# Patient Record
Sex: Female | Born: 1960
Health system: Southern US, Community
[De-identification: ages and names within clinical notes are randomized; demographics above are authoritative.]

## PROBLEM LIST (undated history)

## (undated) ENCOUNTER — Emergency Department (HOSPITAL_COMMUNITY): Payer: BLUE CROSS/BLUE SHIELD | Source: Home / Self Care

## (undated) DIAGNOSIS — J189 Pneumonia, unspecified organism: Secondary | ICD-10-CM

## (undated) DIAGNOSIS — F329 Major depressive disorder, single episode, unspecified: Secondary | ICD-10-CM

## (undated) DIAGNOSIS — I4891 Unspecified atrial fibrillation: Secondary | ICD-10-CM

## (undated) DIAGNOSIS — N393 Stress incontinence (female) (male): Secondary | ICD-10-CM

## (undated) DIAGNOSIS — E039 Hypothyroidism, unspecified: Secondary | ICD-10-CM

## (undated) DIAGNOSIS — S6992XA Unspecified injury of left wrist, hand and finger(s), initial encounter: Secondary | ICD-10-CM

## (undated) DIAGNOSIS — N8189 Other female genital prolapse: Secondary | ICD-10-CM

## (undated) DIAGNOSIS — Z87898 Personal history of other specified conditions: Secondary | ICD-10-CM

## (undated) DIAGNOSIS — R350 Frequency of micturition: Secondary | ICD-10-CM

## (undated) DIAGNOSIS — K219 Gastro-esophageal reflux disease without esophagitis: Secondary | ICD-10-CM

## (undated) DIAGNOSIS — Z973 Presence of spectacles and contact lenses: Secondary | ICD-10-CM

## (undated) DIAGNOSIS — F419 Anxiety disorder, unspecified: Secondary | ICD-10-CM

## (undated) DIAGNOSIS — D649 Anemia, unspecified: Secondary | ICD-10-CM

## (undated) DIAGNOSIS — F32A Depression, unspecified: Secondary | ICD-10-CM

## (undated) DIAGNOSIS — M1990 Unspecified osteoarthritis, unspecified site: Secondary | ICD-10-CM

## (undated) HISTORY — PX: REDUCTION MAMMAPLASTY: SUR839

## (undated) HISTORY — PX: BREAST REDUCTION SURGERY: SHX8

## (undated) HISTORY — PX: BREAST EXCISIONAL BIOPSY: SUR124

## (undated) HISTORY — DX: Gastro-esophageal reflux disease without esophagitis: K21.9

## (undated) HISTORY — DX: Unspecified atrial fibrillation: I48.91

## (undated) HISTORY — PX: ENDOMETRIAL ABLATION W/ NOVASURE: SUR434

---

## 1985-10-04 HISTORY — PX: APPENDECTOMY: SHX54

## 1989-07-04 HISTORY — PX: TUBAL LIGATION: SHX77

## 1998-08-15 ENCOUNTER — Encounter: Payer: Self-pay | Admitting: Family Medicine

## 1998-08-15 ENCOUNTER — Ambulatory Visit (HOSPITAL_COMMUNITY): Admission: RE | Admit: 1998-08-15 | Discharge: 1998-08-15 | Payer: Self-pay | Admitting: Family Medicine

## 1999-10-28 ENCOUNTER — Other Ambulatory Visit: Admission: RE | Admit: 1999-10-28 | Discharge: 1999-10-28 | Payer: Self-pay | Admitting: Obstetrics and Gynecology

## 1999-12-07 ENCOUNTER — Encounter (INDEPENDENT_AMBULATORY_CARE_PROVIDER_SITE_OTHER): Payer: Self-pay | Admitting: Specialist

## 1999-12-07 ENCOUNTER — Ambulatory Visit (HOSPITAL_COMMUNITY): Admission: RE | Admit: 1999-12-07 | Discharge: 1999-12-07 | Payer: Self-pay | Admitting: *Deleted

## 1999-12-07 HISTORY — PX: OTHER SURGICAL HISTORY: SHX169

## 2000-02-26 ENCOUNTER — Encounter: Admission: RE | Admit: 2000-02-26 | Discharge: 2000-02-26 | Payer: Self-pay

## 2000-08-23 ENCOUNTER — Inpatient Hospital Stay (HOSPITAL_COMMUNITY): Admission: EM | Admit: 2000-08-23 | Discharge: 2000-08-24 | Payer: Self-pay | Admitting: *Deleted

## 2001-02-01 ENCOUNTER — Encounter: Admission: RE | Admit: 2001-02-01 | Discharge: 2001-05-02 | Payer: Self-pay | Admitting: Surgical Oncology

## 2001-03-04 HISTORY — PX: ROUX-EN-Y GASTRIC BYPASS: SHX1104

## 2002-05-09 ENCOUNTER — Other Ambulatory Visit: Admission: RE | Admit: 2002-05-09 | Discharge: 2002-05-09 | Payer: Self-pay | Admitting: Obstetrics and Gynecology

## 2003-10-04 ENCOUNTER — Inpatient Hospital Stay (HOSPITAL_COMMUNITY): Admission: AD | Admit: 2003-10-04 | Discharge: 2003-10-06 | Payer: Self-pay | Admitting: Psychiatry

## 2003-10-07 ENCOUNTER — Other Ambulatory Visit (HOSPITAL_COMMUNITY): Admission: RE | Admit: 2003-10-07 | Discharge: 2003-10-24 | Payer: Self-pay | Admitting: Psychiatry

## 2003-10-31 ENCOUNTER — Other Ambulatory Visit: Admission: RE | Admit: 2003-10-31 | Discharge: 2003-10-31 | Payer: Self-pay | Admitting: Obstetrics and Gynecology

## 2003-10-31 ENCOUNTER — Encounter: Admission: RE | Admit: 2003-10-31 | Discharge: 2003-10-31 | Payer: Self-pay | Admitting: Obstetrics and Gynecology

## 2004-11-04 ENCOUNTER — Emergency Department (HOSPITAL_COMMUNITY): Admission: EM | Admit: 2004-11-04 | Discharge: 2004-11-05 | Payer: Self-pay | Admitting: Emergency Medicine

## 2004-11-06 ENCOUNTER — Ambulatory Visit (HOSPITAL_COMMUNITY): Admission: RE | Admit: 2004-11-06 | Discharge: 2004-11-06 | Payer: Self-pay | Admitting: Obstetrics and Gynecology

## 2004-11-16 ENCOUNTER — Encounter: Admission: RE | Admit: 2004-11-16 | Discharge: 2004-11-16 | Payer: Self-pay | Admitting: Obstetrics and Gynecology

## 2004-11-30 ENCOUNTER — Encounter: Admission: RE | Admit: 2004-11-30 | Discharge: 2004-11-30 | Payer: Self-pay | Admitting: Obstetrics and Gynecology

## 2004-12-10 ENCOUNTER — Encounter: Admission: RE | Admit: 2004-12-10 | Discharge: 2004-12-10 | Payer: Self-pay | Admitting: Obstetrics and Gynecology

## 2004-12-23 ENCOUNTER — Ambulatory Visit (HOSPITAL_COMMUNITY): Admission: RE | Admit: 2004-12-23 | Discharge: 2004-12-23 | Payer: Self-pay | Admitting: Family Medicine

## 2005-01-04 ENCOUNTER — Ambulatory Visit (HOSPITAL_COMMUNITY): Admission: RE | Admit: 2005-01-04 | Discharge: 2005-01-04 | Payer: Self-pay | Admitting: *Deleted

## 2005-02-01 ENCOUNTER — Encounter (HOSPITAL_COMMUNITY): Admission: RE | Admit: 2005-02-01 | Discharge: 2005-02-01 | Payer: Self-pay | Admitting: Family Medicine

## 2005-02-09 ENCOUNTER — Encounter (INDEPENDENT_AMBULATORY_CARE_PROVIDER_SITE_OTHER): Payer: Self-pay | Admitting: *Deleted

## 2005-02-09 ENCOUNTER — Observation Stay (HOSPITAL_COMMUNITY): Admission: RE | Admit: 2005-02-09 | Discharge: 2005-02-10 | Payer: Self-pay | Admitting: *Deleted

## 2005-02-09 HISTORY — PX: LAPAROSCOPIC CHOLECYSTECTOMY: SUR755

## 2005-06-21 ENCOUNTER — Encounter: Admission: RE | Admit: 2005-06-21 | Discharge: 2005-06-21 | Payer: Self-pay | Admitting: Obstetrics and Gynecology

## 2006-01-05 ENCOUNTER — Encounter: Admission: RE | Admit: 2006-01-05 | Discharge: 2006-01-05 | Payer: Self-pay | Admitting: Obstetrics and Gynecology

## 2007-11-17 ENCOUNTER — Ambulatory Visit (HOSPITAL_COMMUNITY): Admission: RE | Admit: 2007-11-17 | Discharge: 2007-11-17 | Payer: Self-pay | Admitting: Family Medicine

## 2009-01-06 ENCOUNTER — Other Ambulatory Visit: Admission: RE | Admit: 2009-01-06 | Discharge: 2009-01-06 | Payer: Self-pay | Admitting: Family Medicine

## 2010-09-14 ENCOUNTER — Encounter
Admission: RE | Admit: 2010-09-14 | Discharge: 2010-09-14 | Payer: Self-pay | Source: Home / Self Care | Attending: Family Medicine | Admitting: Family Medicine

## 2010-09-25 ENCOUNTER — Encounter
Admission: RE | Admit: 2010-09-25 | Discharge: 2010-09-25 | Payer: Self-pay | Source: Home / Self Care | Attending: Family Medicine | Admitting: Family Medicine
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
## 2011-02-19 NOTE — Op Note (Signed)
[facility]  Patient:    Tammy [name], Tammy [name]                       MRN: [id number] Proc. Date: 12/07/99 Adm. Date:  [id number] Disc. Date: [id number] Attending:  Stephenie Acres                           Operative Report  PREOPERATIVE DIAGNOSIS:  Abnormal left mammogram.  POSTOPERATIVE DIAGNOSIS:  Abnormal left mammogram.  PROCEDURE:  Needle localization of left breast and excisional biopsy.  SURGEON:  Catalina Lunger, M.D.  ANESTHESIA:  General, laryngeal mask.  DESCRIPTION OF PROCEDURE:  The patient was taken to the operating room and placed in the supine position.  After adequate general, laryngeal mask anesthesia was induced, the left breast was prepped and draped in a normal sterile fashion. An elliptical incision was made along the wire in the 12 oclock region of the left  breast.  I dissected down to a depth of approximately 11 cm, taking all tissue around the reinforced segment of the wire.  The specimen was sent for specimen mammography.  Adequate hemostasis was ensured and the skin was closed with subcuticular 4-0 Monocryl.  Steri-Strips and a sterile dressing were applied.  The patient tolerated the procedure well and went to PACU in good condition. DD:  12/07/99 TD:  12/08/99 Job: [id number]
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
## 2011-02-19 NOTE — Discharge Summary (Signed)
[facility]  Patient:    Tammy [name], Tammy [name] Visit Number: [id number] MRN: [id number]          Service Type: PSY Location: 50 0501 [id number] Attending Physician:  Otilio Saber Dictated by:   Candi Leash. Orsini, N.P. Admit Date:  08/23/2000 Discharge Date: 08/24/2000                             Discharge Summary  HISTORY OF PRESENT ILLNESS:  This patient is a 50 year old white married female admitted on a voluntary basis for depression.  The patient was having some family problems, arguing with her husband and is having some revenge, The patient funduscopic out her husband was having an affair.  The patient also states she has been drinking, averaging about 2-3 drinks or 2-3 beers twice a week.  She did have a blackout episode 11 days prior to admission after drinking six drinks.  The patient reports she is suspicious of her husband and very stressed.  The patient denies any suicidal ideation or intent and that simply really wanted outpatient treatment and counseling.  Her sleep has been averaging 5-6 hours a night.  Her appetite has been good.  PAST PSYCHIATRIC HISTORY:  The patient has had no recent inpatient treatment. She has had marriage counseling x 2.  PAST MEDICAL HISTORY:  Her primary care provider is Dr. Leonides Sake.  Her medical problems include degenerative disk disease and occasional PVCs.  ADMISSION MEDICATIONS:  Prozac 40 mg 1 p.o. q.h.s., Voltaren and Flexeril.  DRUG ALLERGIES:  SULFA, she gets a rash.  PHYSICAL EXAMINATION:  Blood pressure was slightly elevated at 153/87 and her weight was 268.  MENTAL STATUS EXAMINATION:  An overweight Caucasian female, casually dressed, good eye contact.  She is cooperative.  Speech is normal and relevant and mood is tearful and sad.  Her affect is depressed.  She denies any suicidal ideation or intent.  She denies homicidal ideation, thought or intent. Thought processes are logical and coherent.  No  evidence of psychosis.  No hallucinations, no delusions.  Cognitively, she is alert and oriented.  Her cognitive functioning is intact.  Her judgment is good.  Insight is fair. impulse control is good.  ADMITTING DIAGNOSES: Axis I:    Major depression, recurrent. Axis II:   Deferred. Axis III:  Degenerative disk disease. Axis IV:   Severe, related to marital problems. Axis V:    Current global assessment of functioning is 50, this past year is            58.  HOSPITAL COURSE:  The patient was voluntarily admitted to [facility] for depression.  We maintained safety, checking patient every 15 minutes.  Resume her Prozac 40 mg 1 p.o. q.a.m., trazodone 50 mg ordered q.h.s.  Librium was ordered 25 mg on a p.r.n. basis for signs and symptoms of alcohol withdrawal and Ultram was ordered for headaches.  CONDITION ON DISCHARGE:  The patient had a very short hospital stay.  She was denying any suicidal ideation.  Her mood and affect were fairly euthymic. The patient was doing well with her Prozac.  She was sleeping and eating well There were discussions undertaken about how to cope with her marital issues. The patient was discharged as she was denying any suicidal ideation.  DISCHARGE MEDICATIONS:  Prozac 20 mg 2 tabs q.d., trazodone 50 mg 1 p.o. q.h.s.  ACTIVITY:  No restrictions.  DIET:  No restrictions.  FOLLOW-UP:  The patient is to follow up with Valinda Hoar on September 01, 2000 at 4 p.m. and Lawson Radar on September 07, 2000 at [facility].  DISCHARGE DIAGNOSES: Axis I:    Major depression, recurrent. Axis II:   Deferred. Axis III:  Degenerative disk disease. Axis IV:   Severe related to marital issues. Axis V:    Current global assessment of functioning is 65, this past year is            6. Dictated by:   Candi Leash. Orsini, N.P. Attending Physician:  Otilio Saber DD:  09/21/00 TD:  09/22/00 Job: [id number]
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
## 2011-02-19 NOTE — Op Note (Signed)
Tammy [name], Tammy [name]                ACCOUNT NO.:  [id number]   MEDICAL RECORD NO.:  [id number]          PATIENT TYPE:  AMB   LOCATION:  DAY                          [facility]  [facility]   PHYSICIAN:  Vikki Ports, MDDATE OF BIRTH:  Dec 02, 1960   DATE OF PROCEDURE:  02/09/2005  DATE OF DISCHARGE:                                 OPERATIVE REPORT   PREOPERATIVE DIAGNOSES:  1.  Status post gastric bypass.  2.  Symptomatic cholelithiasis.   POSTOPERATIVE DIAGNOSES:  1.  Status post gastric bypass.  2.  Symptomatic cholelithiasis.   PROCEDURE:  Laparoscopic cholecystectomy with intraoperative cholangiogram.   SURGEON:  Vikki Ports, M.D.   ASSISTANT:  Sandria Bales. Ezzard Standing, M.D.   ANESTHESIA:  General.   DESCRIPTION OF PROCEDURE:  The patient was taken to the operating room and  placed in the supine position.  After adequate general anesthesia was  induced using an endotracheal tube, the abdomen was prepped and draped in  the normal sterile fashion.  Using a vertical oriented infraumbilical  incision, I dissected down the fascia.  Fascia was opened vertically.  Peritoneum was entered and pneumoperitoneum was obtained.  There were  adhesions to the upper midline of the abdomen.  However, I was easily able  to visualize the liver, gallbladder.  I placed a 10-mm trocar in the  subxiphoid region, two 5-mm trocars in the right abdomen.  Gallbladder was  retracted cephalad.  Dissection at the fundus revealed the cystic duct,  which I clipped proximally on the gallbladder.  Ductotomy was made and  cholangiogram was performed which showed free filling of the common duct, no  filling defects, normal anatomy, and free flow into the duodenum.  It was  removed and the cystic duct was triply clipped and divided, as was the  cystic artery.  Gallbladder was taken down off the gallbladder bed using  Bovie electrocautery.  A small hole was made in the posterior wall and it  was placed in an  EndoCatch bag.  Adequate hemostasis was ensured.  The  gallbladder was removed.  The fascial defect was closed with the 0 Vicryl  pursestring sutures.  Skin incisions were closed with subcuticular 4-0  Monocryl.  Steri-Strips and sterile dressings were applied.  The patient  tolerated the procedure well and went to PACU in good condition.      KRH/MEDQ  D:  02/09/2005  T:  02/09/2005  Job:  [id number]

## 2011-03-12 ENCOUNTER — Other Ambulatory Visit: Payer: Self-pay | Admitting: Specialist

## 2011-03-12 DIAGNOSIS — IMO0002 Reserved for concepts with insufficient information to code with codable children: Secondary | ICD-10-CM

## 2011-03-12 DIAGNOSIS — G8929 Other chronic pain: Secondary | ICD-10-CM

## 2011-03-20 ENCOUNTER — Ambulatory Visit
Admission: RE | Admit: 2011-03-20 | Discharge: 2011-03-20 | Disposition: A | Discharge status: Home / Self Care | Payer: Self-pay | Source: Ambulatory Visit | Attending: Specialist | Admitting: Specialist

## 2011-03-20 DIAGNOSIS — G8929 Other chronic pain: Secondary | ICD-10-CM

## 2011-03-20 DIAGNOSIS — IMO0002 Reserved for concepts with insufficient information to code with codable children: Secondary | ICD-10-CM

## 2011-04-13 ENCOUNTER — Other Ambulatory Visit: Payer: Self-pay | Admitting: Family Medicine

## 2011-04-13 DIAGNOSIS — N63 Unspecified lump in unspecified breast: Secondary | ICD-10-CM

## 2011-04-19 ENCOUNTER — Ambulatory Visit
Admission: RE | Admit: 2011-04-19 | Discharge: 2011-04-19 | Disposition: A | Discharge status: Home / Self Care | Payer: 59 | Source: Ambulatory Visit | Attending: Family Medicine | Admitting: Family Medicine

## 2011-04-19 DIAGNOSIS — N63 Unspecified lump in unspecified breast: Secondary | ICD-10-CM

## 2011-10-20 ENCOUNTER — Other Ambulatory Visit: Payer: Self-pay | Admitting: Family Medicine

## 2011-10-20 DIAGNOSIS — N63 Unspecified lump in unspecified breast: Secondary | ICD-10-CM

## 2011-10-27 ENCOUNTER — Ambulatory Visit
Admission: RE | Admit: 2011-10-27 | Discharge: 2011-10-27 | Disposition: A | Discharge status: Home / Self Care | Payer: 59 | Source: Ambulatory Visit | Attending: Family Medicine | Admitting: Family Medicine

## 2011-10-27 DIAGNOSIS — N63 Unspecified lump in unspecified breast: Secondary | ICD-10-CM

## 2012-04-22 ENCOUNTER — Emergency Department (INDEPENDENT_AMBULATORY_CARE_PROVIDER_SITE_OTHER)
Admission: EM | Admit: 2012-04-22 | Discharge: 2012-04-22 | Disposition: A | Discharge status: Home / Self Care | Payer: 59 | Source: Home / Self Care

## 2012-04-22 ENCOUNTER — Encounter (HOSPITAL_COMMUNITY): Payer: Self-pay | Admitting: Nurse Practitioner

## 2012-04-22 ENCOUNTER — Emergency Department (INDEPENDENT_AMBULATORY_CARE_PROVIDER_SITE_OTHER): Payer: 59

## 2012-04-22 DIAGNOSIS — M25511 Pain in right shoulder: Secondary | ICD-10-CM

## 2012-04-22 DIAGNOSIS — M25519 Pain in unspecified shoulder: Secondary | ICD-10-CM

## 2012-04-22 DIAGNOSIS — M79609 Pain in unspecified limb: Secondary | ICD-10-CM

## 2012-04-22 DIAGNOSIS — M79675 Pain in left toe(s): Secondary | ICD-10-CM

## 2012-04-22 MED ORDER — IBUPROFEN 600 MG PO TABS: 600.0000 mg | ORAL_TABLET | Freq: Four times a day (QID) | ORAL | Status: AC | PRN

## 2012-04-22 MED ORDER — IBUPROFEN 800 MG PO TABS
ORAL_TABLET | ORAL | Status: AC
  Filled 2012-04-22: qty 1

## 2012-04-22 MED ORDER — TRAMADOL HCL 50 MG PO TABS: 50.0000 mg | ORAL_TABLET | Freq: Four times a day (QID) | ORAL | Status: AC | PRN

## 2012-04-22 MED ORDER — IBUPROFEN 800 MG PO TABS
800.0000 mg | ORAL_TABLET | Freq: Once | ORAL | Status: AC
  Administered 2012-04-22: 800 mg via ORAL

## 2012-04-22 NOTE — ED Provider Notes (Signed)
Medical screening examination/treatment/procedure(s) were performed by non-physician practitioner and as supervising physician I was immediately available for consultation/collaboration.  Raynald Blend, MD 04/22/12 2050
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
## 2012-08-23 ENCOUNTER — Other Ambulatory Visit: Payer: Self-pay | Admitting: Obstetrics & Gynecology

## 2012-09-13 ENCOUNTER — Encounter (HOSPITAL_COMMUNITY): Payer: Self-pay | Admitting: Pharmacist

## 2012-09-19 ENCOUNTER — Encounter (HOSPITAL_COMMUNITY): Payer: Self-pay | Admitting: *Deleted

## 2012-09-25 ENCOUNTER — Ambulatory Visit (HOSPITAL_COMMUNITY)
Admission: RE | Admit: 2012-09-25 | Discharge: 2012-09-26 | Disposition: A | Discharge status: Home / Self Care | Payer: 59 | Source: Ambulatory Visit | Attending: Obstetrics & Gynecology | Admitting: Obstetrics & Gynecology

## 2012-09-25 ENCOUNTER — Encounter (HOSPITAL_COMMUNITY)
Admission: RE | Disposition: A | Discharge status: Home / Self Care | Payer: Self-pay | Source: Ambulatory Visit | Attending: Obstetrics & Gynecology

## 2012-09-25 ENCOUNTER — Encounter (HOSPITAL_COMMUNITY): Payer: Self-pay | Admitting: Anesthesiology

## 2012-09-25 ENCOUNTER — Ambulatory Visit (HOSPITAL_COMMUNITY): Payer: 59 | Admitting: Anesthesiology

## 2012-09-25 ENCOUNTER — Encounter (HOSPITAL_COMMUNITY): Payer: Self-pay | Admitting: *Deleted

## 2012-09-25 DIAGNOSIS — N816 Rectocele: Secondary | ICD-10-CM

## 2012-09-25 DIAGNOSIS — N393 Stress incontinence (female) (male): Secondary | ICD-10-CM

## 2012-09-25 DIAGNOSIS — N898 Other specified noninflammatory disorders of vagina: Secondary | ICD-10-CM | POA: Insufficient documentation

## 2012-09-25 DIAGNOSIS — N8111 Cystocele, midline: Secondary | ICD-10-CM

## 2012-09-25 DIAGNOSIS — N812 Incomplete uterovaginal prolapse: Secondary | ICD-10-CM | POA: Insufficient documentation

## 2012-09-25 HISTORY — DX: Depression, unspecified: F32.A

## 2012-09-25 HISTORY — DX: Major depressive disorder, single episode, unspecified: F32.9

## 2012-09-25 HISTORY — DX: Unspecified osteoarthritis, unspecified site: M19.90

## 2012-09-25 HISTORY — DX: Anxiety disorder, unspecified: F41.9

## 2012-09-25 HISTORY — PX: ANTERIOR AND POSTERIOR REPAIR: SHX5121

## 2012-09-25 HISTORY — DX: Anemia, unspecified: D64.9

## 2012-09-25 LAB — CBC
HCT: 37.5 % (ref 36.0–46.0)
Hemoglobin: 12 g/dL (ref 12.0–15.0)
MCH: 28.9 pg (ref 26.0–34.0)
MCHC: 32 g/dL (ref 30.0–36.0)
MCV: 90.4 fL (ref 78.0–100.0)
Platelets: 260 10*3/uL (ref 150–400)
RBC: 4.15 MIL/uL (ref 3.87–5.11)
RDW: 13.9 % (ref 11.5–15.5)
WBC: 6.4 10*3/uL (ref 4.0–10.5)

## 2012-09-25 SURGERY — ANTERIOR (CYSTOCELE) AND POSTERIOR REPAIR (RECTOCELE)
Anesthesia: General | Site: Vagina | Wound class: Clean Contaminated

## 2012-09-25 MED ORDER — HYDROMORPHONE HCL PF 1 MG/ML IJ SOLN
INTRAMUSCULAR | Status: AC
  Filled 2012-09-25: qty 1

## 2012-09-25 MED ORDER — CEFAZOLIN SODIUM-DEXTROSE 2-3 GM-% IV SOLR: 2.0000 g | INTRAVENOUS | Status: DC

## 2012-09-25 MED ORDER — LIDOCAINE-EPINEPHRINE 0.5 %-1:200000 IJ SOLN
INTRAMUSCULAR | Status: AC
  Filled 2012-09-25: qty 1

## 2012-09-25 MED ORDER — ESTRADIOL 0.1 MG/GM VA CREA
TOPICAL_CREAM | VAGINAL | Status: DC | PRN
  Administered 2012-09-25: 1 via VAGINAL

## 2012-09-25 MED ORDER — CEFAZOLIN SODIUM-DEXTROSE 2-3 GM-% IV SOLR
INTRAVENOUS | Status: AC
  Filled 2012-09-25: qty 50

## 2012-09-25 MED ORDER — PROPOFOL 10 MG/ML IV EMUL
INTRAVENOUS | Status: AC
  Filled 2012-09-25: qty 20

## 2012-09-25 MED ORDER — ALPRAZOLAM 0.5 MG PO TABS: 0.5000 mg | ORAL_TABLET | Freq: Four times a day (QID) | ORAL | Status: DC | PRN

## 2012-09-25 MED ORDER — DEXAMETHASONE SODIUM PHOSPHATE 10 MG/ML IJ SOLN
INTRAMUSCULAR | Status: AC
  Filled 2012-09-25: qty 1

## 2012-09-25 MED ORDER — ONDANSETRON HCL 4 MG PO TABS: 4.0000 mg | ORAL_TABLET | Freq: Four times a day (QID) | ORAL | Status: DC | PRN

## 2012-09-25 MED ORDER — CITALOPRAM HYDROBROMIDE 20 MG PO TABS
20.0000 mg | ORAL_TABLET | Freq: Every day | ORAL | Status: DC
  Administered 2012-09-26: 20 mg via ORAL
  Filled 2012-09-25 (×2): qty 1

## 2012-09-25 MED ORDER — 0.9 % SODIUM CHLORIDE (POUR BTL) OPTIME
TOPICAL | Status: DC | PRN
  Administered 2012-09-25: 1000 mL

## 2012-09-25 MED ORDER — HYDROMORPHONE HCL PF 1 MG/ML IJ SOLN
0.2500 mg | INTRAMUSCULAR | Status: DC | PRN
  Administered 2012-09-25 (×3): 0.5 mg via INTRAVENOUS

## 2012-09-25 MED ORDER — KCL IN DEXTROSE-NACL 20-5-0.45 MEQ/L-%-% IV SOLN
INTRAVENOUS | Status: DC
  Administered 2012-09-25 – 2012-09-26 (×2): via INTRAVENOUS
  Filled 2012-09-25 (×6): qty 1000

## 2012-09-25 MED ORDER — OXYCODONE-ACETAMINOPHEN 5-325 MG PO TABS
1.0000 | ORAL_TABLET | ORAL | Status: DC | PRN
  Administered 2012-09-25 – 2012-09-26 (×4): 2 via ORAL
  Filled 2012-09-25 (×4): qty 2

## 2012-09-25 MED ORDER — HYDROMORPHONE HCL PF 1 MG/ML IJ SOLN
INTRAMUSCULAR | Status: AC
  Administered 2012-09-25: 0.5 mg via INTRAVENOUS
  Filled 2012-09-25: qty 1

## 2012-09-25 MED ORDER — BUPROPION HCL ER (XL) 150 MG PO TB24
450.0000 mg | ORAL_TABLET | Freq: Every day | ORAL | Status: DC
  Administered 2012-09-26: 450 mg via ORAL
  Filled 2012-09-25 (×2): qty 1

## 2012-09-25 MED ORDER — ONDANSETRON HCL 4 MG/2ML IJ SOLN: 4.0000 mg | Freq: Four times a day (QID) | INTRAMUSCULAR | Status: DC | PRN

## 2012-09-25 MED ORDER — MORPHINE SULFATE 4 MG/ML IJ SOLN
2.0000 mg | INTRAMUSCULAR | Status: DC | PRN
  Administered 2012-09-25 (×2): 2 mg via INTRAVENOUS
  Filled 2012-09-25 (×2): qty 1

## 2012-09-25 MED ORDER — STERILE WATER FOR IRRIGATION IR SOLN: Status: DC | PRN

## 2012-09-25 MED ORDER — OXYCODONE HCL ER 10 MG PO T12A
20.0000 mg | EXTENDED_RELEASE_TABLET | Freq: Two times a day (BID) | ORAL | Status: DC
  Administered 2012-09-25 – 2012-09-26 (×2): 20 mg via ORAL
  Filled 2012-09-25 (×2): qty 2

## 2012-09-25 MED ORDER — MIDAZOLAM HCL 2 MG/2ML IJ SOLN
INTRAMUSCULAR | Status: AC
  Filled 2012-09-25: qty 2

## 2012-09-25 MED ORDER — FENTANYL CITRATE 0.05 MG/ML IJ SOLN
INTRAMUSCULAR | Status: AC
  Filled 2012-09-25: qty 5

## 2012-09-25 MED ORDER — LIDOCAINE HCL (CARDIAC) 20 MG/ML IV SOLN
INTRAVENOUS | Status: AC
  Filled 2012-09-25: qty 5

## 2012-09-25 MED ORDER — OXYCODONE HCL ER 10 MG PO T12A
20.0000 mg | EXTENDED_RELEASE_TABLET | Freq: Two times a day (BID) | ORAL | Status: DC
  Filled 2012-09-25 (×2): qty 2

## 2012-09-25 MED ORDER — LIDOCAINE-EPINEPHRINE 0.5 %-1:200000 IJ SOLN
INTRAMUSCULAR | Status: DC | PRN
  Administered 2012-09-25: 16 mL

## 2012-09-25 MED ORDER — ZOLPIDEM TARTRATE 5 MG PO TABS: 5.0000 mg | ORAL_TABLET | Freq: Every evening | ORAL | Status: DC | PRN

## 2012-09-25 MED ORDER — LACTATED RINGERS IV SOLN
INTRAVENOUS | Status: DC
  Administered 2012-09-25: 125 mL/h via INTRAVENOUS

## 2012-09-25 MED ORDER — ONDANSETRON HCL 4 MG/2ML IJ SOLN
INTRAMUSCULAR | Status: AC
  Filled 2012-09-25: qty 2

## 2012-09-25 MED ORDER — ESTRADIOL 0.1 MG/GM VA CREA
TOPICAL_CREAM | VAGINAL | Status: AC
  Filled 2012-09-25: qty 42.5

## 2012-09-25 MED ORDER — KETOROLAC TROMETHAMINE 30 MG/ML IJ SOLN
30.0000 mg | Freq: Four times a day (QID) | INTRAMUSCULAR | Status: DC
  Administered 2012-09-25 – 2012-09-26 (×3): 30 mg via INTRAVENOUS
  Filled 2012-09-25 (×3): qty 1

## 2012-09-25 SURGICAL SUPPLY — 28 items
CLOTH BEACON ORANGE TIMEOUT ST (SAFETY) ×2 IMPLANT
CONT PATH 16OZ SNAP LID 3702 (MISCELLANEOUS) IMPLANT
DECANTER SPIKE VIAL GLASS SM (MISCELLANEOUS) IMPLANT
DEVICE CAPIO SLIM SINGLE (INSTRUMENTS) IMPLANT
GAUZE PACKING 2X5 YD STERILE (GAUZE/BANDAGES/DRESSINGS) ×4 IMPLANT
GLOVE BIO SURGEON STRL SZ 6.5 (GLOVE) IMPLANT
GLOVE BIOGEL PI IND STRL 6.5 (GLOVE) ×3 IMPLANT
GLOVE BIOGEL PI IND STRL 8 (GLOVE) ×1 IMPLANT
GLOVE BIOGEL PI INDICATOR 6.5 (GLOVE) ×3
GLOVE BIOGEL PI INDICATOR 8 (GLOVE) ×1
GOWN STRL REIN XL XLG (GOWN DISPOSABLE) ×8 IMPLANT
NEEDLE HYPO 22GX1.5 SAFETY (NEEDLE) IMPLANT
NEEDLE MAYO .5 CIRCLE (NEEDLE) ×2 IMPLANT
NEEDLE SPNL 22GX3.5 QUINCKE BK (NEEDLE) IMPLANT
NS IRRIG 1000ML POUR BTL (IV SOLUTION) ×2 IMPLANT
PACK VAGINAL WOMENS (CUSTOM PROCEDURE TRAY) ×2 IMPLANT
SET CYSTO W/LG BORE CLAMP LF (SET/KITS/TRAYS/PACK) ×2 IMPLANT
SUT CAPIO POLYGLYCOLIC (SUTURE) IMPLANT
SUT CHROMIC 2 0 SH (SUTURE) IMPLANT
SUT VIC AB 0 CT1 27 (SUTURE) ×4
SUT VIC AB 0 CT1 27XBRD ANBCTR (SUTURE) ×2 IMPLANT
SUT VIC AB 2-0 SH 27 (SUTURE) ×12
SUT VIC AB 2-0 SH 27XBRD (SUTURE) ×6 IMPLANT
SYR 20CC LL (SYRINGE) IMPLANT
TOWEL OR 17X24 6PK STRL BLUE (TOWEL DISPOSABLE) ×4 IMPLANT
TRAY FOLEY BAG SILVER LF 14FR (CATHETERS) ×2 IMPLANT
TRAY FOLEY CATH 14FR (SET/KITS/TRAYS/PACK) IMPLANT
WATER STERILE IRR 1000ML POUR (IV SOLUTION) ×2 IMPLANT

## 2012-09-25 NOTE — Anesthesia Postprocedure Evaluation (Signed)
  Anesthesia Post-op Note  Patient: Tammy [name]  Procedure(s) Performed: Procedure(s) (LRB) with comments: ANTERIOR (CYSTOCELE) AND POSTERIOR REPAIR (RECTOCELE) (N/A)  Patient Location: PACU and Women[facility] Unit  Anesthesia Type:General  Level of Consciousness: awake, alert , oriented and patient cooperative  Airway and Oxygen Therapy: Patient Spontanous Breathing  Post-op Pain: mild  Post-op Assessment: Patient's Cardiovascular Status Stable, Respiratory Function Stable and No signs of Nausea or vomiting  Post-op Vital Signs: Reviewed and stable  Complications: No apparent anesthesia complications

## 2012-09-25 NOTE — Op Note (Signed)
PREOPERATIVE DIAGNOSES:  1. Symptomatic second-degree cystocele and rectocele.  2. First-degree uterine prolapse.  3. Desire for maintenance of uterus.  POSTOPERATIVE DIAGNOSES:  1. Symptomatic second-degree cystocele and rectocele.  2. First-degree uterine prolapse.  3. Desire for maintenance of uterus.  OPERATIONS PERFORMED:  1. Anterior colporrhaphy.  2. Posterior colporrhaphy and colpoperineoplasty.  SURGEON: JACKSON-MOORE,LISA A  ANESTHESIA: General.  ESTIMATED BLOOD LOSS: 200 mL.   DESCRIPTION OF OPERATION: In the operating room, the patient was placed under general anesthetic in lithotomy position. Examination revealed a widened introitus with a second-degree rectocele.   The uterus is mid position and mobile.  Rectovaginal confirmatory. A time-out was performed confirming the patient, procedure, antibiotic and allergy status.  After appropriate prep, the patient was draped in the usual manner for major vaginal surgery.  A weighted speculum was placed posteriorly and the vagina was retracted anteriorly with a Sims retractor. At the 10 and 2 o'clock positions, at the cervicovaginal mucosal junction, vaginal mucosa was grasped with an Allis clamp and intervening tissue excised. The vagina was then opened in the midline to within a centimeter of distal urethral meatus. The bladder was then sharply dissected free from the overlying vaginal mucosa. Pubovesical-cervical fascia was identified and an initial suture at the urethrovesical angle was placed using 0 Vicryl in a transverse position through that fascial plane. The remainder of the cystocele was reduced with several interrupted 0 Vicryl. Redundant vaginal tissue was excised and the vagina closed in midline with simple and figure-of-eight 0 Vicryl.  The bladder was then retracted superiorly. The hymenal ring was grasped at the 4 and 8 o'clock positions. A triangular incision was made to within a centimeter of the anus. The intervening tissue  was excised. The vagina was then opened in the midline to within a centimeter of the cervix. An anchoring suture was placed superiorly. The perineal body was freed and the rectum freed from the overlying vagina. Three perineal sutures were placed at this point using 0 Vicryl for eventual reapproximation. Perirectal fascia was then transversely sutured in the midline with several interrupted 0 Vicryl for support.  Redundant vaginal mucosa was excised and the vagina closed in the midline in simple fashion with 0 Vicryl, grasping the fascia. Perineal sutures were then tied and the remainder of the perineal body reapproximated with several interrupted 3-0 Vicryl. Rectal exam was negative. The patient tolerated the procedure well. A Foley catheter drained  of clear urine. Rectal examination was negative.  A vaginal pack was placed. The patient was sent to recovery in good condition.

## 2012-09-25 NOTE — Anesthesia Preprocedure Evaluation (Addendum)
Anesthesia Evaluation  Patient identified by MRN, date of birth, ID band Patient awake    Reviewed: Allergy & Precautions, H&P , Patient's Chart, lab work & pertinent test results, reviewed documented beta blocker date and time   Airway Mallampati: II TM Distance: >3 FB Neck ROM: full    Dental No notable dental hx.    Pulmonary  breath sounds clear to auscultation  Pulmonary exam normal       Cardiovascular Rhythm:regular Rate:Normal     Neuro/Psych    GI/Hepatic   Endo/Other  Morbid obesity  Renal/GU      Musculoskeletal   Abdominal   Peds  Hematology   Anesthesia Other Findings   Reproductive/Obstetrics                          Anesthesia Physical Anesthesia Plan  ASA: III  Anesthesia Plan: General   Post-op Pain Management:    Induction: Intravenous  Airway Management Planned: LMA  Additional Equipment:   Intra-op Plan:   Post-operative Plan:   Informed Consent: I have reviewed the patients History and Physical, chart, labs and discussed the procedure including the risks, benefits and alternatives for the proposed anesthesia with the patient or authorized representative who has indicated his/her understanding and acceptance.   Dental Advisory Given  Plan Discussed with: CRNA and Surgeon  Anesthesia Plan Comments: (  Discussed  general anesthesia, including possible nausea, instrumentation of airway, sore throat,pulmonary aspiration, etc. I asked if the were any outstanding questions, or  concerns before we proceeded. )        Anesthesia Quick Evaluation

## 2012-09-25 NOTE — H&P (Signed)
  Subjective:    Patient is a 51 y.o. female scheduled for an anterior/posterior colporrhaphy. Indications for procedure are cystocele/rectocele.  Onset of symptoms was several years agoago. Symptoms have been gradually worsening since that time. She is a aware of a bulge.  There are associated stress incontinence symptoms.  She has done Kegel's exercises as instructed.  Pertinent Gynecological History:  Menses: regular Bleeding: normal Contraception: tubal ligation Preventive screening:  Last mammogram: normal Date: 5/13 Last pap: normal Date: 2012  Discussed Blood/Blood Products: no  Menstrual History: OB History    Grav Para Term Preterm Abortions TAB SAB Ect Mult Living                   No LMP recorded. Patient has had an ablation.    The following portions of the patient's history were reviewed and updated as appropriate: problem list.  Review of Systems Pertinent items are noted in HPI.    Objective:    Ht 5\' 6"  (1.676 m)  Wt 99.791 kg (220 lb)  BMI 35.51 kg/m2  General:   alert  Skin:   normal  HEENT:  PERRLA  Lungs:   clear to auscultation bilaterally  Heart:   regular rate and rhythm, S1, S2 normal, no murmur, click, rub or gallop  Breasts:   normal without suspicious masses, skin or nipple changes or axillary nodes  Abdomen:  soft, non-tender; bowel sounds normal; no masses,  no organomegaly  Pelvis:  POP-Q performed, leading edge--rectocele; negative stress test; gaping introitus; positive anal wink; good external anal sphincter tone                                Assessment:     Pelvic floor relaxation      Plan:    Counseling: Procedure, risks, reasons, benefits and complications (including injury to bowel, bladder,  ureter, bleeding, possibility of transfusion, infection, or fistula formation) reviewed in detail. Consent signed. Preop testing ordered. Instructions reviewed, including NPO after midnight.

## 2012-09-25 NOTE — Transfer of Care (Signed)
Immediate Anesthesia Transfer of Care Note  Patient: Tammy [name]  Procedure(s) Performed: Procedure(s) (LRB) with comments: ANTERIOR (CYSTOCELE) AND POSTERIOR REPAIR (RECTOCELE) (N/A)  Patient Location: PACU  Anesthesia Type:General  Level of Consciousness: sedated  Airway & Oxygen Therapy: Patient Spontanous Breathing and Patient connected to nasal cannula oxygen  Post-op Assessment: Report given to PACU RN and Post -op Vital signs reviewed and stable  Post vital signs: Reviewed and stable  Complications: No apparent anesthesia complications

## 2012-09-25 NOTE — Addendum Note (Signed)
Addendum  created 09/25/12 1713 by Collier Flowers, CRNA   Modules edited:Notes Section

## 2012-09-25 NOTE — Anesthesia Postprocedure Evaluation (Signed)
  Anesthesia Post-op Note  Patient: Tammy [name]  Procedure(s) Performed: Procedure(s) (LRB) with comments: ANTERIOR (CYSTOCELE) AND POSTERIOR REPAIR (RECTOCELE) (N/A)  Patient is awake and responsive. Pain and nausea are reasonably well controlled. Vital signs are stable and clinically acceptable. Oxygen saturation is clinically acceptable. There are no apparent anesthetic complications at this time. Patient is ready for discharge.

## 2012-09-26 DIAGNOSIS — N393 Stress incontinence (female) (male): Secondary | ICD-10-CM

## 2012-09-26 LAB — CBC
HCT: 32.4 % — ABNORMAL LOW (ref 36.0–46.0)
Hemoglobin: 10.6 g/dL — ABNORMAL LOW (ref 12.0–15.0)
MCH: 29.6 pg (ref 26.0–34.0)
MCHC: 32.7 g/dL (ref 30.0–36.0)
MCV: 90.5 fL (ref 78.0–100.0)
Platelets: 213 10*3/uL (ref 150–400)
RBC: 3.58 MIL/uL — ABNORMAL LOW (ref 3.87–5.11)
RDW: 14 % (ref 11.5–15.5)
WBC: 9.3 10*3/uL (ref 4.0–10.5)

## 2012-09-26 LAB — BASIC METABOLIC PANEL
BUN: 7 mg/dL (ref 6–23)
CO2: 27 mEq/L (ref 19–32)
Calcium: 8.4 mg/dL (ref 8.4–10.5)
Chloride: 105 mEq/L (ref 96–112)
Creatinine, Ser: 0.68 mg/dL (ref 0.50–1.10)
GFR calc Af Amer: >90 mL/min (ref >90)
GFR calc non Af Amer: >90 mL/min (ref >90)
Glucose, Bld: 105 mg/dL — ABNORMAL HIGH (ref 70–99)
Potassium: 3.8 mEq/L (ref 3.5–5.1)
Sodium: 139 mEq/L (ref 135–145)

## 2012-09-26 MED ORDER — OXYCODONE-ACETAMINOPHEN 5-325 MG PO TABS: 1.0000 | ORAL_TABLET | ORAL | Status: DC | PRN

## 2012-09-26 MED FILL — Ondansetron HCl Inj 4 MG/2ML (2 MG/ML): INTRAMUSCULAR | Qty: 1 | Status: AC

## 2012-09-26 MED FILL — Fentanyl Citrate Inj 0.05 MG/ML: INTRAMUSCULAR | Qty: 5 | Status: AC

## 2012-09-26 MED FILL — Lidocaine HCl IV Inj 20 MG/ML: INTRAVENOUS | Qty: 1 | Status: AC

## 2012-09-26 MED FILL — Hydromorphone HCl Inj 1 MG/ML: INTRAMUSCULAR | Qty: 1 | Status: AC

## 2012-09-26 MED FILL — Midazolam HCl Inj 2 MG/2ML (Base Equivalent): INTRAMUSCULAR | Qty: 2 | Status: AC

## 2012-09-26 MED FILL — Lactated Ringer's Solution: INTRAVENOUS | Qty: 1000 | Status: AC

## 2012-09-26 MED FILL — Dexamethasone Sodium Phosphate Inj 10 MG/ML: INTRAMUSCULAR | Qty: 1 | Status: AC

## 2012-09-26 MED FILL — Lactated Ringer's Solution: INTRAVENOUS | Qty: 2000 | Status: AC

## 2012-09-26 MED FILL — Cefazolin Sodium for IV Soln 2 GM and Dextrose 3% (50 ML): INTRAVENOUS | Qty: 50 | Status: AC

## 2012-09-26 MED FILL — Propofol IV Emul 10 MG/ML: INTRAVENOUS | Qty: 20 | Status: AC

## 2012-09-26 NOTE — Progress Notes (Signed)
PTS   Voids and  Residual  Reviewed   With  DR Clearance Coots   OK to  Discharge

## 2012-09-26 NOTE — Discharge Summary (Signed)
Physician Discharge Summary  Patient ID: Tammy [name] MRN: [id number] DOB/AGE: [birth year] 51 y.o.  Admit date: 09/25/2012 Discharge date: 09/26/2012  Admission Diagnoses:  Rectocele with cystocele without uterine prolapse.  SUI.  Discharge Diagnoses:   Same Active Problems:  SUI (stress urinary incontinence, female)   Discharged Condition: good  Hospital Course: Underwent A&P Repair without complications.  Discharged home.  Consults: None  Significant Diagnostic Studies: labs: CBC  Treatments: IV hydration, analgesia: Oxycontin and surgery: A&P Repair  Discharge Exam: Blood pressure 109/72, pulse 85, temperature 98 F (36.7 C), temperature source Oral, resp. rate 18, height 5\' 6"  (1.676 m), weight 220 lb (99.791 kg), SpO2 97.00%. General appearance: alert and no distress Resp: clear to auscultation bilaterally Cardio: regular rate and rhythm, S1, S2 normal, no murmur, click, rub or gallop GI: normal findings: soft, non-tender Extremities: extremities normal, atraumatic, no cyanosis or edema Vagina with scant bleeding.  Disposition: 01-Home or Self Care  Discharge Orders    Future Orders Please Complete By Expires   Diet - low sodium heart healthy      Increase activity slowly      Discharge instructions      Comments:   Routine   Driving Restrictions      Comments:   No driving for 2 weeks.   Lifting restrictions      Comments:   No lifting > 20 lbs.   Sexual Activity Restrictions      Comments:   No sex for 6 weeks.   Call MD for:      Call MD for:  temperature >100.4      Call MD for:  persistant nausea and vomiting      Call MD for:  severe uncontrolled pain      Call MD for:  redness, tenderness, or signs of infection (pain, swelling, redness, odor or green/yellow discharge around incision site)      Call MD for:  difficulty breathing, headache or visual disturbances      Call MD for:  hives      Call MD for:  persistant dizziness or light-headedness       Call MD for:  extreme fatigue      (HEART FAILURE PATIENTS) Call MD:  Anytime you have any of the following symptoms: 1) 3 pound weight gain in 24 hours or 5 pounds in 1 week 2) shortness of breath, with or without a dry hacking cough 3) swelling in the hands, feet or stomach 4) if you have to sleep on extra pillows at night in order to breathe.      Discharge patient          Medication List     As of 09/26/2012 10:02 AM    TAKE these medications         ALPRAZolam 0.5 MG tablet   Commonly known as: XANAX   Take 0.5 mg by mouth 4 (four) times daily as needed.      buPROPion 150 MG 24 hr tablet   Commonly known as: WELLBUTRIN XL   Take 450 mg by mouth daily.      citalopram 20 MG tablet   Commonly known as: CELEXA   Take 20 mg by mouth daily.      diclofenac sodium 1 % Gel   Commonly known as: VOLTAREN   Apply 2 g topically daily.      multivitamin with minerals Tabs   Take 1 tablet by mouth daily.      oxyCODONE  20 MG 12 hr tablet   Commonly known as: OXYCONTIN   Take 20 mg by mouth every 12 (twelve) hours.      oxyCODONE-acetaminophen 5-325 MG per tablet   Commonly known as: PERCOCET/ROXICET   Take 1-2 tablets by mouth every 4 (four) hours as needed for pain (moderate to severe pain (when tolerating fluids)).      VITAMIN B-12 PO   Take 1 tablet by mouth.           Follow-up Information    Follow up with Antionette Char A, MD. Schedule an appointment as soon as possible for a visit in 2 weeks.   Contact information:   [address] [place] [address] [phone]          Signed: Brock Bad 09/26/2012, 10:02 AM

## 2012-09-26 NOTE — Progress Notes (Signed)
Vaginal packing removed. Moderate sanguinous drainage with few marble sized clots present. Patient tolerated well.

## 2012-09-26 NOTE — Progress Notes (Signed)
1 Day Post-Op Procedure(s) (LRB): ANTERIOR (CYSTOCELE) AND POSTERIOR REPAIR (RECTOCELE) (N/A)  Subjective: Patient reports no complaints.  Objective: I have reviewed patient's vital signs, intake and output, medications and labs.  General: alert and no distress Resp: clear to auscultation bilaterally Cardio: regular rate and rhythm, S1, S2 normal, no murmur, click, rub or gallop GI: normal findings: soft, non-tender Extremities: extremities normal, atraumatic, no cyanosis or edema Vaginal Bleeding: minimal  Assessment: s/p Procedure(s) (LRB) with comments: ANTERIOR (CYSTOCELE) AND POSTERIOR REPAIR (RECTOCELE) (N/A): stable and tolerating diet  Plan: Advance diet Discontinue IV fluids Discharge home  LOS: 1 day    HARPER,CHARLES A 09/26/2012, 9:47 AM

## 2012-09-27 ENCOUNTER — Encounter (HOSPITAL_COMMUNITY): Payer: Self-pay | Admitting: Obstetrics & Gynecology

## 2012-11-01 ENCOUNTER — Emergency Department (HOSPITAL_COMMUNITY)
Admission: EM | Admit: 2012-11-01 | Discharge: 2012-11-01 | Disposition: A | Discharge status: Home / Self Care | Payer: 59 | Attending: Emergency Medicine | Admitting: Emergency Medicine

## 2012-11-01 ENCOUNTER — Encounter (HOSPITAL_COMMUNITY): Payer: Self-pay | Admitting: Emergency Medicine

## 2012-11-01 DIAGNOSIS — M79606 Pain in leg, unspecified: Secondary | ICD-10-CM

## 2012-11-01 DIAGNOSIS — Z9889 Other specified postprocedural states: Secondary | ICD-10-CM | POA: Insufficient documentation

## 2012-11-01 DIAGNOSIS — M129 Arthropathy, unspecified: Secondary | ICD-10-CM | POA: Insufficient documentation

## 2012-11-01 DIAGNOSIS — R209 Unspecified disturbances of skin sensation: Secondary | ICD-10-CM | POA: Insufficient documentation

## 2012-11-01 DIAGNOSIS — Z862 Personal history of diseases of the blood and blood-forming organs and certain disorders involving the immune mechanism: Secondary | ICD-10-CM | POA: Insufficient documentation

## 2012-11-01 DIAGNOSIS — F411 Generalized anxiety disorder: Secondary | ICD-10-CM | POA: Insufficient documentation

## 2012-11-01 DIAGNOSIS — Z9089 Acquired absence of other organs: Secondary | ICD-10-CM | POA: Insufficient documentation

## 2012-11-01 DIAGNOSIS — F3289 Other specified depressive episodes: Secondary | ICD-10-CM | POA: Insufficient documentation

## 2012-11-01 DIAGNOSIS — F329 Major depressive disorder, single episode, unspecified: Secondary | ICD-10-CM | POA: Insufficient documentation

## 2012-11-01 DIAGNOSIS — IMO0001 Reserved for inherently not codable concepts without codable children: Secondary | ICD-10-CM | POA: Insufficient documentation

## 2012-11-01 DIAGNOSIS — M79609 Pain in unspecified limb: Secondary | ICD-10-CM

## 2012-11-01 DIAGNOSIS — Z8659 Personal history of other mental and behavioral disorders: Secondary | ICD-10-CM | POA: Insufficient documentation

## 2012-11-01 DIAGNOSIS — Z79899 Other long term (current) drug therapy: Secondary | ICD-10-CM | POA: Insufficient documentation

## 2012-11-01 LAB — CBC
HCT: 39.4 % (ref 36.0–46.0)
Hemoglobin: 13 g/dL (ref 12.0–15.0)
MCH: 29.5 pg (ref 26.0–34.0)
MCHC: 33 g/dL (ref 30.0–36.0)
MCV: 89.3 fL (ref 78.0–100.0)
Platelets: 268 10*3/uL (ref 150–400)
RBC: 4.41 MIL/uL (ref 3.87–5.11)
RDW: 13.3 % (ref 11.5–15.5)
WBC: 6.4 10*3/uL (ref 4.0–10.5)

## 2012-11-01 LAB — BASIC METABOLIC PANEL
BUN: 11 mg/dL (ref 6–23)
CO2: 25 mEq/L (ref 19–32)
Calcium: 9 mg/dL (ref 8.4–10.5)
Chloride: 104 mEq/L (ref 96–112)
Creatinine, Ser: 0.76 mg/dL (ref 0.50–1.10)
GFR calc Af Amer: >90 mL/min (ref >90)
GFR calc non Af Amer: >90 mL/min (ref >90)
Glucose, Bld: 100 mg/dL — ABNORMAL HIGH (ref 70–99)
Potassium: 4.1 mEq/L (ref 3.5–5.1)
Sodium: 138 mEq/L (ref 135–145)

## 2012-11-01 MED ORDER — HYDROCODONE-ACETAMINOPHEN 5-325 MG PO TABS: 1.0000 | ORAL_TABLET | Freq: Four times a day (QID) | ORAL | Status: DC | PRN

## 2012-11-01 MED ORDER — CYCLOBENZAPRINE HCL 10 MG PO TABS: 10.0000 mg | ORAL_TABLET | Freq: Two times a day (BID) | ORAL | Status: DC | PRN

## 2012-11-01 MED ORDER — CYCLOBENZAPRINE HCL 10 MG PO TABS
10.0000 mg | ORAL_TABLET | Freq: Once | ORAL | Status: AC
  Administered 2012-11-01: 10 mg via ORAL
  Filled 2012-11-01: qty 1

## 2012-11-01 NOTE — ED Notes (Signed)
Pt st's she had surg on 09/25/12 for rectocele, and bladder lift.  St's she has had pain in right leg since then.  Unable to sleep last pm due to pain. St's pain starts in right hip and radiates down to mid calf.

## 2012-11-01 NOTE — ED Notes (Signed)
Pt discharged.Vital signs stable and GCS 15 

## 2012-11-01 NOTE — ED Provider Notes (Signed)
History   This chart was scribed for Clinton Sawyer, non-physician practitioner, working with Glynn Octave, MD by Charolett Bumpers, ED Scribe. This patient was seen in room TR07C/TR07C and the patient's care was started at 1756.    CSN: [id number]  Arrival date & time 11/01/12  1610   First MD Initiated Contact with Patient 11/01/12 1756      Chief Complaint  Patient presents with  . Leg Pain    The history is provided by the patient. No language interpreter was used.  Tammy [name] is a 52 y.o. female who presents to the Emergency Department complaining of severe right leg pain. She states that she had surgery on 09/25/12 and has had right leg pain intermittently since. She reports the pain has been constant for the past week and has gradually worsened. She reports associated numbness/tingling and her symptoms are aggravated with bearing weight after long periods of sitting down. She denies any urinary symptoms, chest pain, SOB. She has taken Tylenol without relief. She denies any h/o DVT. Her doctor advised her to come to the emergency department to rule out blood clot.  Past Medical History  Diagnosis Date  . Mental disorder   . Depression   . Anxiety   . Anemia     history  . Arthritis     neck and lower back - deg disc disease  . SVD (spontaneous vaginal delivery)     x 3    Past Surgical History  Procedure Date  . Appendectomy   . Cholecystectomy   . Gastric bypass   . Tubal ligation   . Anterior and posterior repair 09/25/2012    Procedure: ANTERIOR (CYSTOCELE) AND POSTERIOR REPAIR (RECTOCELE);  Surgeon: Antionette Char, MD;  Location: [facility];  Service: Gynecology;  Laterality: N/A;    No family history on file.  History  Substance Use Topics  . Smoking status: Never Smoker   . Smokeless tobacco: Never Used  . Alcohol Use: No     Comment: drinks daily - none since 08/2012 per pt.    OB History    Grav Para Term Preterm Abortions TAB SAB Ect  Mult Living                  Review of Systems  Musculoskeletal: Positive for myalgias. Negative for joint swelling.  All other systems reviewed and are negative.    Allergies  Adhesive; Latex; and Sulfa drugs cross reactors  Home Medications   Current Outpatient Rx  Name  Route  Sig  Dispense  Refill  . ALPRAZOLAM 0.5 MG PO TABS   Oral   Take 0.5 mg by mouth 4 (four) times daily as needed.          . BUPROPION HCL ER (XL) 150 MG PO TB24   Oral   Take 450 mg by mouth daily.         Marland Kitchen CITALOPRAM HYDROBROMIDE 20 MG PO TABS   Oral   Take 20 mg by mouth daily.         Marland Kitchen VITAMIN B-12 PO   Oral   Take 1 tablet by mouth.         . DICLOFENAC SODIUM 1 % TD GEL   Topical   Apply 2 g topically daily.         . ADULT MULTIVITAMIN W/MINERALS CH   Oral   Take 1 tablet by mouth daily.         . OXYCODONE  HCL ER 20 MG PO TB12   Oral   Take 20 mg by mouth every 12 (twelve) hours.         . OXYCODONE-ACETAMINOPHEN 5-325 MG PO TABS   Oral   Take 1-2 tablets by mouth every 4 (four) hours as needed for pain (moderate to severe pain (when tolerating fluids)).   40 tablet   0     BP 136/77  Pulse 81  Temp 98.5 F (36.9 C) (Oral)  SpO2 97%  LMP 09/30/2012  Physical Exam  Nursing note and vitals reviewed. Constitutional: She is oriented to person, place, and time. She appears well-developed and well-nourished. No distress.  HENT:  Head: Normocephalic and atraumatic.  Eyes: Conjunctivae normal and EOM are normal.  Neck: Neck supple. No tracheal deviation present.  Cardiovascular: Normal rate, regular rhythm, normal heart sounds and intact distal pulses.   Pulmonary/Chest: Effort normal and breath sounds normal. No respiratory distress.  Musculoskeletal: Normal range of motion. She exhibits tenderness.       Tenderness down the entire right lateral thigh. Tenderness below the knee in the left calf.   Neurological: She is alert and oriented to person, place,  and time.  Skin: Skin is warm and dry.  Psychiatric: She has a normal mood and affect. Her behavior is normal.    ED Course  Procedures (including critical care time)  DIAGNOSTIC STUDIES: Oxygen Saturation is 97% on room air, adequate by my interpretation.    COORDINATION OF CARE:  18:10-Discussed planned course of treatment with the patient including a right lower extremity venous duplex,  who is agreeable at this time.   19:00-Recheck: Venous duplex study was negative. Will order CBC and BMP. Will also order Flexeril.  19:30-Medication Orders: Cyclobenzaprine (Flexeril) tablet 10 mg-once.   Results for orders placed during the hospital encounter of 11/01/12  CBC      Component Value Range   WBC 6.4  4.0 - 10.5 K/uL   RBC 4.41  3.87 - 5.11 MIL/uL   Hemoglobin 13.0  12.0 - 15.0 g/dL   HCT 16.1  09.6 - 04.5 %   MCV 89.3  78.0 - 100.0 fL   MCH 29.5  26.0 - 34.0 pg   MCHC 33.0  30.0 - 36.0 g/dL   RDW 40.9  81.1 - 91.4 %   Platelets 268  150 - 400 K/uL  BASIC METABOLIC PANEL      Component Value Range   Sodium 138  135 - 145 mEq/L   Potassium 4.1  3.5 - 5.1 mEq/L   Chloride 104  96 - 112 mEq/L   CO2 25  19 - 32 mEq/L   Glucose, Bld 100 (*) 70 - 99 mg/dL   BUN 11  6 - 23 mg/dL   Creatinine, Ser 7.82  0.50 - 1.10 mg/dL   Calcium 9.0  8.4 - 95.6 mg/dL   GFR calc non Af Amer >90  >90 mL/min   GFR calc Af Amer >90  >90 mL/min    No results found.   1. Leg pain       MDM  52 year old female with right leg pain after having surgery on December 23. She was advised to be evaluated for potential blood clot. M the duplex ultrasound negative for any blood clots. Basic labs normal. She was given muscle relaxers and pain medication and she will followup with her primary care physician. Case discussed with Dr. Manus Gunning who agrees with plan of care.   I personally performed  the services described in this documentation, which was scribed in my presence. The recorded information has  been reviewed and is accurate.       Trevor Mace, PA-C 11/01/12 2337

## 2012-11-01 NOTE — Progress Notes (Signed)
VASCULAR LAB PRELIMINARY  PRELIMINARY  PRELIMINARY  PRELIMINARY  Right lower extremity venous duplex  completed.    Preliminary report:  Right:  No evidence of DVT, superficial thrombosis, or Baker's cyst.  CESTONE, HELENE, RVT 11/01/2012, 7:05 PM

## 2012-11-01 NOTE — ED Notes (Signed)
Spoke with Vascular will perform test at bedside.

## 2012-11-02 NOTE — ED Provider Notes (Signed)
Medical screening examination/treatment/procedure(s) were performed by non-physician practitioner and as supervising physician I was immediately available for consultation/collaboration.   Stephen Rancour, MD 11/02/12 0016 

## 2013-01-31 ENCOUNTER — Encounter: Payer: Self-pay | Admitting: Obstetrics

## 2013-02-12 ENCOUNTER — Other Ambulatory Visit: Payer: Self-pay | Admitting: Family Medicine

## 2013-02-12 DIAGNOSIS — M48061 Spinal stenosis, lumbar region without neurogenic claudication: Secondary | ICD-10-CM

## 2013-02-15 ENCOUNTER — Ambulatory Visit
Admission: RE | Admit: 2013-02-15 | Discharge: 2013-02-15 | Disposition: A | Discharge status: Home / Self Care | Payer: 59 | Source: Ambulatory Visit | Attending: Family Medicine | Admitting: Family Medicine

## 2013-02-15 DIAGNOSIS — M48061 Spinal stenosis, lumbar region without neurogenic claudication: Secondary | ICD-10-CM

## 2013-03-26 ENCOUNTER — Ambulatory Visit (INDEPENDENT_AMBULATORY_CARE_PROVIDER_SITE_OTHER): Payer: 59 | Admitting: Obstetrics & Gynecology

## 2013-03-26 ENCOUNTER — Encounter: Payer: Self-pay | Admitting: Obstetrics & Gynecology

## 2013-03-26 VITALS — BP 123/84 | HR 79 | Temp 98.3°F | Ht 66.0 in | Wt 245.0 lb

## 2013-03-26 DIAGNOSIS — N393 Stress incontinence (female) (male): Secondary | ICD-10-CM

## 2013-03-26 NOTE — Progress Notes (Signed)
.   Subjective:     Tammy [name] is a 52 y.o. female here for a follow up exam ( anterior/posterior repair 09/25/12).  Current complaints: Right leg pain.  Personal health questionnaire reviewed: yes.   Gynecologic History No LMP recorded. Patient has had an ablation. Contraception: none Last Pap: 2013. Results were: normal Last mammogram: 2013 . Results were: normal  Obstetric History OB History   Grav Para Term Preterm Abortions TAB SAB Ect Mult Living                   The following portions of the patient's history were reviewed and updated as appropriate: allergies, current medications, past family history, past medical history, past social history, past surgical history and problem list.  Review of Systems Pertinent items are noted in HPI.    Objective:   Pelvic exam: good vaginal  support  Assessment:   Persistent stress urinary incontinence symptoms  Plan:    A transobturator sling is planned.  Risks of mesh placement reviewed.   Return postop.

## 2013-03-26 NOTE — Patient Instructions (Signed)
Urethral Vaginal Sling A urethral vaginal sling can be done to correct urinary incontinence (UI). Urinary incontinence is uncontrolled loss of urine. It is very common in both women who have had children and in aging women. The purpose of the "urethral vaginal sling" is to place a strong piece of material (i.e., tension-free vaginal tape or nylon mesh) that fits under the urethra like a hammock. The urethra is the tube that drains your bladder. This sling is put in position to straighten, support and hold the urethra in its normal position. This is a common surgical procedure for this problem. LET YOUR CAREGIVER KNOW ABOUT:   Any allergies to foods or medications.  All the medications you are taking. This includes over-the-counter drugs, herbs, eye drops, creams and steroids.  Use of illegal drugs.  Smoking or heavy alcohol drinking.  Past problems with anesthetics.  Possibility of being pregnant.  History of blood clots or other blood problems.  History of bleeding problems.  Past surgery.  Other medical or health problems. RISKS AND COMPLICATIONS   Infection.  Excessive bleeding.  Damage to other organs.  Problems urinating properly for several days or weeks.  Problems from the anesthesia.  The UI can come back. BEFORE THE PROCEDURE   Do not take aspirin or blood thinners a week before the surgery unless you are told otherwise.  Do not eat or drink anything 8 hours before the surgery.  If you smoke, do not smoke at least two weeks before the surgery.  Let your caregiver know if you get a cold or infection before your surgery.  If you will be admitted the same day as the surgery, get to the hospital at least one hour before the surgery.  Arrange for someone to take you home from the hospital and for help at home. AFTER THE PROCEDURE   You will be taken to recovery area where nurses will monitor your progress and vital signs (blood pressure, pulse, breathing,  temperature). They will move you to your room when you are stable.  You will have a catheter in your bladder until you can urinate properly.  You may have a gauze packing in the vagina to prevent bleeding. This will be removed in one to two days.  You are usually in the hospital 2 to 3 days. HOME CARE INSTRUCTIONS   Take showers instead of baths until you are informed otherwise.  Resume your usual diet.  Get rest and sleep.  Try to have someone at home for 2 to 3 weeks to help you with your household activities.  Do not lift anything over 5 pounds.  Only take over-the-counter or prescription medicines for pain, discomfort or fever as directed by your caregiver. Do not take aspirin because it can cause bleeding.  Do not douche, exercise, use tampons or have sexual intercourse until your caregiver gives you permission. SEEK MEDICAL CARE IF:   You have a heavy or bad smelling vaginal discharge.  You develop a rash.  You need stronger pain medication.  You are having side effects from your medications.  You develop lightheadedness or feel faint. SEEK IMMEDIATE MEDICAL CARE IF:   You develop a temperature of 100 F (37.8 C) or higher.  You have vaginal bleeding.  You faint or pass out.  You develop shortness of breath.  You develop chest, abdominal or leg pain.  You develop pain when urinating or cannot urinate.  Your catheter is still in your bladder and it blocks up.  You  develop swelling, redness and pain in the vaginal area. Document Released: 06/29/2008 Document Revised: 12/13/2011 Document Reviewed: 06/29/2008 [facility] Patient Information 2014 [place], [place].

## 2013-04-30 ENCOUNTER — Encounter: Payer: Self-pay | Admitting: Obstetrics & Gynecology

## 2013-05-02 ENCOUNTER — Encounter: Payer: Self-pay | Admitting: Obstetrics & Gynecology

## 2013-05-02 ENCOUNTER — Other Ambulatory Visit: Payer: Self-pay | Admitting: *Deleted

## 2013-05-31 ENCOUNTER — Inpatient Hospital Stay (HOSPITAL_COMMUNITY)
Admission: RE | Admit: 2013-05-31 | Discharge: 2013-05-31 | Disposition: A | Discharge status: Home / Self Care | Payer: 59 | Source: Ambulatory Visit

## 2013-06-07 ENCOUNTER — Ambulatory Visit (HOSPITAL_COMMUNITY): Admission: RE | Admit: 2013-06-07 | Payer: 59 | Source: Ambulatory Visit | Admitting: Obstetrics & Gynecology

## 2013-06-07 ENCOUNTER — Encounter (HOSPITAL_COMMUNITY): Admission: RE | Payer: Self-pay | Source: Ambulatory Visit

## 2013-06-07 SURGERY — TRANSVAGINAL TAPE (TVT) PROCEDURE
Anesthesia: Choice

## 2013-09-18 ENCOUNTER — Other Ambulatory Visit (HOSPITAL_COMMUNITY): Payer: Self-pay | Admitting: Family Medicine

## 2013-09-18 DIAGNOSIS — R1310 Dysphagia, unspecified: Secondary | ICD-10-CM

## 2013-09-20 ENCOUNTER — Ambulatory Visit (HOSPITAL_COMMUNITY): Payer: 59

## 2013-12-25 ENCOUNTER — Other Ambulatory Visit: Payer: Self-pay | Admitting: Internal Medicine

## 2013-12-25 DIAGNOSIS — E010 Iodine-deficiency related diffuse (endemic) goiter: Secondary | ICD-10-CM

## 2013-12-26 ENCOUNTER — Other Ambulatory Visit: Payer: 59

## 2013-12-27 ENCOUNTER — Ambulatory Visit
Admission: RE | Admit: 2013-12-27 | Discharge: 2013-12-27 | Disposition: A | Discharge status: Home / Self Care | Payer: 59 | Source: Ambulatory Visit | Attending: Internal Medicine | Admitting: Internal Medicine

## 2013-12-27 DIAGNOSIS — E010 Iodine-deficiency related diffuse (endemic) goiter: Secondary | ICD-10-CM

## 2014-03-06 ENCOUNTER — Other Ambulatory Visit: Payer: Self-pay | Admitting: *Deleted

## 2014-05-05 ENCOUNTER — Other Ambulatory Visit: Payer: Self-pay | Admitting: Obstetrics & Gynecology

## 2014-08-19 ENCOUNTER — Other Ambulatory Visit: Payer: Self-pay

## 2014-08-19 DIAGNOSIS — Z1231 Encounter for screening mammogram for malignant neoplasm of breast: Secondary | ICD-10-CM

## 2014-08-20 ENCOUNTER — Encounter: Payer: Self-pay | Admitting: Internal Medicine

## 2014-08-28 ENCOUNTER — Ambulatory Visit
Admission: RE | Admit: 2014-08-28 | Discharge: 2014-08-28 | Disposition: A | Discharge status: Home / Self Care | Payer: 59 | Source: Ambulatory Visit

## 2014-08-28 DIAGNOSIS — Z1231 Encounter for screening mammogram for malignant neoplasm of breast: Secondary | ICD-10-CM

## 2014-09-26 ENCOUNTER — Encounter: Payer: Self-pay | Admitting: *Deleted

## 2014-09-30 ENCOUNTER — Encounter: Payer: Self-pay | Admitting: *Deleted

## 2014-10-01 ENCOUNTER — Encounter: Payer: Self-pay | Admitting: Obstetrics & Gynecology

## 2014-10-10 ENCOUNTER — Encounter: Payer: Self-pay | Admitting: Internal Medicine

## 2014-10-10 ENCOUNTER — Ambulatory Visit (INDEPENDENT_AMBULATORY_CARE_PROVIDER_SITE_OTHER): Payer: 59 | Admitting: Internal Medicine

## 2014-10-10 VITALS — BP 122/74 | HR 84 | Ht 64.75 in | Wt 237.2 lb

## 2014-10-10 DIAGNOSIS — Z1211 Encounter for screening for malignant neoplasm of colon: Secondary | ICD-10-CM

## 2014-10-10 DIAGNOSIS — R1314 Dysphagia, pharyngoesophageal phase: Secondary | ICD-10-CM

## 2014-10-10 NOTE — Patient Instructions (Addendum)
You have been scheduled for an endoscopy and colonoscopy. Please follow the written instructions given to you at your visit today. Please pick up your prep supplies at the pharmacy. If you use inhalers (even only as needed), please bring them with you on the day of your procedure. Your physician has requested that you go to www.startemmi.com and enter the access code given to you at your visit today. This web site gives a general overview about your procedure. However, you should still follow specific instructions given to you by our office regarding your preparation for the procedure.  You have been scheduled for a Barium Esophogram at [facility] (1st floor of the hospital) on 10/15/14 at 10:15am. Please arrive 15 minutes prior to your appointment for registration. Make certain not to have anything to eat or drink 3 hours prior to your test. If you need to reschedule for any reason, please contact radiology at [phone] to do so. __________________________________________________________________ A barium swallow is an examination that concentrates on views of the esophagus. This tends to be a double contrast exam (barium and two liquids which, when combined, create a gas to distend the wall of the oesophagus) or single contrast (non-ionic iodine based). The study is usually tailored to your symptoms so a good history is essential. Attention is paid during the study to the form, structure and configuration of the esophagus, looking for functional disorders (such as aspiration, dysphagia, achalasia, motility and reflux) EXAMINATION You may be asked to change into a gown, depending on the type of swallow being performed. A radiologist and radiographer will perform the procedure. The radiologist will advise you of the type of contrast selected for your procedure and direct you during the exam. You will be asked to stand, sit or lie in several different positions and to hold a small amount of  fluid in your mouth before being asked to swallow while the imaging is performed .In some instances you may be asked to swallow barium coated marshmallows to assess the motility of a solid food bolus. The exam can be recorded as a digital or video fluoroscopy procedure. POST PROCEDURE It will take 1-2 days for the barium to pass through your system. To facilitate this, it is important, unless otherwise directed, to increase your fluids for the next 24-48hrs and to resume your normal diet.  This test typically takes about 30 minutes to perform. __________________________________________________________________________________  I appreciate the opportunity to care for you. Silvano Rusk, M.D., [facility]

## 2014-10-10 NOTE — Progress Notes (Addendum)
Patient ID: Tammy [name], female   DOB: 1960/11/28, 54 y.o.   MRN: [id number]    HPI:   This is Tammy [name] is a 54 year old female referred for evaluation by Dr. Tollie Pizza do to dysphagia.  The patient had a Roux-en-Y gastric bypass 13 years ago. He has a history of reflux and dysphagia and was evaluated by Dr. Benson [name] in 2011. At that time she had an EGD which was normal and she never followed up. She says she has never been on any meds for her reflux. Over the past 10 months, she has been again having difficulty swallowing solids. She often feels that food gets stuck in the right posterior throat and sometimes she can cough the food up several hours later. She has no bad breath. She has no heartburn. She also says that she often feels that food gets stuck in the midesophagus. She occasionally gets fluids coming back up if she drinks too quickly. He has no epigastric pain, nausea, or vomiting. Her appetite has been good and she has had no abnormal weight loss.  The patient recently turned 75 and is also interested in scheduling a screening colonoscopy. She has had no change in her bowel habits or stool caliber. She's had no bloody or tarry stools. He is a 40 year old sister with a history of ulcerative colitis. Her mother is alive at 7 and had colon polyps removed in her 78s.      Past Medical History  Diagnosis Date  . Mental disorder   . Depression   . Anxiety   . Anemia     history  . Arthritis     neck and lower back - deg disc disease  . SVD (spontaneous vaginal delivery)     x 3  . GERD (gastroesophageal reflux disease)     Past Surgical History  Procedure Laterality Date  . Appendectomy    . Cholecystectomy    . Gastric bypass    . Tubal ligation    . Anterior and posterior repair  09/25/2012    Procedure: ANTERIOR (CYSTOCELE) AND POSTERIOR REPAIR (RECTOCELE);  Surgeon: Lahoma Crocker, MD;  Location: [facility];  Service: Gynecology;  Laterality: N/A;  . Roux-en-y procedure N/A  2003   Family History  Problem Relation Age of Onset  . Breast cancer Maternal Aunt   . Stomach cancer Maternal Grandmother   . Stomach cancer Maternal Uncle   . Colon cancer Maternal Uncle     x3  . Colon polyps Mother   . Diabetes Maternal Grandmother   . Diabetes Maternal Aunt     Type I  . Diabetes Maternal Uncle     x5 - Type II  . Heart disease Maternal Grandmother   . Heart disease Paternal Grandmother   . Esophageal cancer Neg Hx   . Gallbladder disease Neg Hx    History  Substance Use Topics  . Smoking status: Never Smoker   . Smokeless tobacco: Never Used  . Alcohol Use: No     Comment: drinks daily - none since 08/2012 per pt.   Current Outpatient Prescriptions  Medication Sig Dispense Refill  . ALPRAZolam (XANAX) 0.5 MG tablet Take 0.5 mg by mouth 4 (four) times daily as needed.     Marland Kitchen buPROPion (WELLBUTRIN XL) 150 MG 24 hr tablet Take 450 mg by mouth daily.    . diclofenac sodium (VOLTAREN) 1 % GEL Apply 2 g topically daily.    Marland Kitchen levothyroxine (SYNTHROID, LEVOTHROID) 75 MCG tablet Take 75 mcg  by mouth daily.   11  . Multiple Vitamin (MULTIVITAMIN WITH MINERALS) TABS Take 1 tablet by mouth daily.     No current facility-administered medications for this visit.   Allergies  Allergen Reactions  . Adhesive [Tape] Itching and Rash  . Latex Itching and Rash  . Sulfa Drugs Cross Reactors Itching and Rash     Review of Systems: Gen: Denies any fever, chills, sweats, anorexia, fatigue, weakness, malaise, weight loss, and sleep disorder CV: Denies chest pain, angina, palpitations, syncope, orthopnea, PND, peripheral edema, and claudication. Resp: Denies dyspnea at rest, dyspnea with exercise, cough, sputum, wheezing, coughing up blood, and pleurisy. GI: Denies vomiting blood, jaundice, and fecal incontinence.   Has dysphagia to solids. GU : Denies urinary burning, blood in urine, urinary frequency, urinary hesitancy, nocturnal urination, and urinary  incontinence. MS: Denies joint pain, limitation of movement, and swelling, stiffness, low back pain, extremity pain. Denies muscle weakness, cramps, atrophy.  Derm: Denies rash, itching, dry skin, hives, moles, warts, or unhealing ulcers.  Psych: Denies depression, anxiety, memory loss, suicidal ideation, hallucinations, paranoia, and confusion. Heme: Denies bruising, bleeding, and enlarged lymph nodes. Neuro:  Denies any headaches, dizziness, paresthesias. Endo:  Denies any problems with DM, thyroid, adrenal function   Prior Endoscopies:  Patient had an EGD by Dr. Benson [name] in 2011 that she says was normal. The report is not available to [place] at this time.  Physical Exam: BP 122/74 mmHg  Pulse 84  Ht 5' 4.75" (1.645 m)  Wt 237 lb 4 oz (107.616 kg)  BMI 39.77 kg/m2 Constitutional: Pleasant,well-developed female in no acute distress. HEENT: Normocephalic and atraumatic. Conjunctivae are normal. No scleral icterus. Neck supple. No thyromegaly Cardiovascular: Normal rate, regular rhythm.  Pulmonary/chest: Effort normal and breath sounds normal. No wheezing, rales or rhonchi. Abdominal: Soft, nondistended, nontender. Bowel sounds active throughout. There are no masses palpable. No hepatomegaly. Extremities: no edema Lymphadenopathy: No cervical adenopathy noted. Neurological: Alert and oriented to person place and time. Skin: Skin is warm and dry. No rashes noted. Psychiatric: Normal mood and affect. Behavior is normal.  ASSESSMENT AND PLAN: #1 dysphagia. Patient describes a sensation of food becoming lodged in the right posterior throat and canned sometimes cough old food up several hours after she is eating. She also feels food is getting lodged in the midesophagus. She will be scheduled for an esophagram with barium pill to evaluate for a possible Zenker's diverticulum or esophageal stricture. She will also be scheduled for an EGD with possible dilation.The risks, benefits, and alternatives to  endoscopy with possible biopsy and possible dilation were discussed with the patient and they consent to proceed.   #2. Colorectal cancer screening. Patient will be scheduled for colonoscopy to evaluate for polyps, neoplasia, or inflammatory process.The risks, benefits, and alternatives to colonoscopy with possible biopsy and possible polypectomy were discussed with the patient and they consent to proceed. The procedure will be scheduled with Dr. Carlean Purl.  Further recommendations will be made pending the findings of the above tests.    Hvozdovic, Vita Barley PA-C 10/10/2014, 4:21 PM   Prior EGD 10/06/10  NL post gastric bypass, No dilation

## 2014-10-11 NOTE — Progress Notes (Signed)
Agree w/ Ms. Hvozdovic's note and mangement.  

## 2014-10-15 ENCOUNTER — Ambulatory Visit (HOSPITAL_COMMUNITY): Payer: 59

## 2014-10-24 ENCOUNTER — Ambulatory Visit (HOSPITAL_COMMUNITY)
Admission: RE | Admit: 2014-10-24 | Discharge: 2014-10-24 | Disposition: A | Discharge status: Home / Self Care | Payer: 59 | Source: Ambulatory Visit | Attending: Internal Medicine | Admitting: Internal Medicine

## 2014-10-24 DIAGNOSIS — R1310 Dysphagia, unspecified: Secondary | ICD-10-CM | POA: Diagnosis present

## 2014-10-24 DIAGNOSIS — R1314 Dysphagia, pharyngoesophageal phase: Secondary | ICD-10-CM

## 2014-10-28 NOTE — Progress Notes (Signed)
Quick Note:  Please let her know this was ok  Will see what EGD shows (Feb) ______

## 2014-10-31 ENCOUNTER — Encounter: Payer: Self-pay | Admitting: Internal Medicine

## 2014-11-29 ENCOUNTER — Encounter: Payer: 59 | Admitting: Internal Medicine

## 2015-11-03 ENCOUNTER — Other Ambulatory Visit: Payer: Self-pay

## 2015-11-03 DIAGNOSIS — Z1231 Encounter for screening mammogram for malignant neoplasm of breast: Secondary | ICD-10-CM

## 2015-11-07 ENCOUNTER — Encounter (HOSPITAL_COMMUNITY): Payer: Self-pay | Admitting: Adult Health

## 2015-11-07 ENCOUNTER — Emergency Department (HOSPITAL_COMMUNITY)
Admission: EM | Admit: 2015-11-07 | Discharge: 2015-11-08 | Disposition: A | Discharge status: Home / Self Care | Payer: 59 | Attending: Emergency Medicine | Admitting: Emergency Medicine

## 2015-11-07 ENCOUNTER — Emergency Department (HOSPITAL_COMMUNITY): Payer: 59

## 2015-11-07 DIAGNOSIS — Z91040 Latex allergy status: Secondary | ICD-10-CM | POA: Insufficient documentation

## 2015-11-07 DIAGNOSIS — R42 Dizziness and giddiness: Secondary | ICD-10-CM | POA: Diagnosis not present

## 2015-11-07 DIAGNOSIS — R569 Unspecified convulsions: Secondary | ICD-10-CM | POA: Diagnosis not present

## 2015-11-07 DIAGNOSIS — F419 Anxiety disorder, unspecified: Secondary | ICD-10-CM | POA: Insufficient documentation

## 2015-11-07 DIAGNOSIS — Z791 Long term (current) use of non-steroidal anti-inflammatories (NSAID): Secondary | ICD-10-CM | POA: Insufficient documentation

## 2015-11-07 DIAGNOSIS — Z79899 Other long term (current) drug therapy: Secondary | ICD-10-CM | POA: Diagnosis not present

## 2015-11-07 DIAGNOSIS — F329 Major depressive disorder, single episode, unspecified: Secondary | ICD-10-CM | POA: Insufficient documentation

## 2015-11-07 DIAGNOSIS — K219 Gastro-esophageal reflux disease without esophagitis: Secondary | ICD-10-CM | POA: Diagnosis not present

## 2015-11-07 DIAGNOSIS — M1990 Unspecified osteoarthritis, unspecified site: Secondary | ICD-10-CM | POA: Diagnosis not present

## 2015-11-07 DIAGNOSIS — R200 Anesthesia of skin: Secondary | ICD-10-CM | POA: Insufficient documentation

## 2015-11-07 LAB — COMPREHENSIVE METABOLIC PANEL
ALT: 15 U/L (ref 14–54)
AST: 23 U/L (ref 15–41)
Albumin: 3.7 g/dL (ref 3.5–5.0)
Alkaline Phosphatase: 65 U/L (ref 38–126)
Anion gap: 14 (ref 5–15)
BUN: 9 mg/dL (ref 6–20)
CO2: 24 mmol/L (ref 22–32)
Calcium: 9.2 mg/dL (ref 8.9–10.3)
Chloride: 106 mmol/L (ref 101–111)
Creatinine, Ser: 0.91 mg/dL (ref 0.44–1.00)
GFR calc Af Amer: >60 mL/min (ref >60)
GFR calc non Af Amer: >60 mL/min (ref >60)
Glucose, Bld: 90 mg/dL (ref 65–99)
Potassium: 3.5 mmol/L (ref 3.5–5.1)
Sodium: 144 mmol/L (ref 135–145)
Total Bilirubin: 0.4 mg/dL (ref 0.3–1.2)
Total Protein: 6.5 g/dL (ref 6.5–8.1)

## 2015-11-07 LAB — CBG MONITORING, ED: Glucose-Capillary: 97 mg/dL (ref 65–99)

## 2015-11-07 MED ORDER — LORAZEPAM 2 MG/ML IJ SOLN
INTRAMUSCULAR | Status: AC
  Filled 2015-11-07: qty 1

## 2015-11-07 MED ORDER — KETOROLAC TROMETHAMINE 30 MG/ML IJ SOLN
30.0000 mg | Freq: Once | INTRAMUSCULAR | Status: AC
  Administered 2015-11-07: 30 mg via INTRAVENOUS
  Filled 2015-11-07: qty 1

## 2015-11-07 MED ORDER — LORAZEPAM 2 MG/ML IJ SOLN
1.0000 mg | Freq: Once | INTRAMUSCULAR | Status: AC
  Administered 2015-11-07: 1 mg via INTRAVENOUS

## 2015-11-07 MED ORDER — LORAZEPAM 2 MG/ML IJ SOLN
1.0000 mg | Freq: Once | INTRAMUSCULAR | Status: AC
  Administered 2015-11-08: 1 mg via INTRAVENOUS
  Filled 2015-11-07: qty 1

## 2015-11-07 MED ORDER — ONDANSETRON HCL 4 MG/2ML IJ SOLN
4.0000 mg | Freq: Once | INTRAMUSCULAR | Status: AC
  Administered 2015-11-07: 4 mg via INTRAVENOUS
  Filled 2015-11-07: qty 2

## 2015-11-07 MED ORDER — METOCLOPRAMIDE HCL 5 MG/ML IJ SOLN
10.0000 mg | Freq: Once | INTRAMUSCULAR | Status: AC
  Administered 2015-11-07: 10 mg via INTRAVENOUS
  Filled 2015-11-07: qty 2

## 2015-11-07 MED ORDER — DIPHENHYDRAMINE HCL 50 MG/ML IJ SOLN
12.5000 mg | Freq: Once | INTRAMUSCULAR | Status: AC
  Administered 2015-11-07: 12.5 mg via INTRAVENOUS
  Filled 2015-11-07: qty 1

## 2015-11-07 NOTE — ED Notes (Signed)
Presents with [facility], while at [employer] she became very rigid and was shaking in upper half of body, husband lowered her to ground. Unsure if seizure or post ictal-she has a bite mark to lip. No history of seizures. Denies feeling anything before incident. Takes xanax and celexa-endorses nausea after episode and anxiety. She is alert, oriented. Zofran given by EMS with no relief of nausea. CBG 72.

## 2015-11-07 NOTE — ED Notes (Signed)
Spoke to Phlebotomist [employer]) who states she sent blood work upstairs.  Lab is now stating they received the dark green top but not the lavendar.  Spoke to [place] who will come redraw CBC and CMet.  NP aware

## 2015-11-07 NOTE — ED Notes (Signed)
Neurology at bedside.

## 2015-11-07 NOTE — ED Notes (Signed)
CBG 97, Katie RN aware

## 2015-11-07 NOTE — ED Notes (Signed)
Attempting to figure out delay in labs.  Main lab states they never received the blood.  Phlebotomy was at bedside at time of patient arrival, and clicked off CBC.

## 2015-11-07 NOTE — ED Notes (Signed)
Phlebotomy and NP at bedside

## 2015-11-07 NOTE — ED Provider Notes (Signed)
CSN: [id number]     Arrival date & time 11/07/15  2016 History   First MD Initiated Contact with Patient 11/07/15 2025     Chief Complaint  Patient presents with  . Seizures     (Consider location/radiation/quality/duration/timing/severity/associated sxs/prior Treatment) HPI Comments: 55 year old female with a history of anxiety, hypothyroidism, depression, anemia, GERD, who stated after dinner tonight she was at [employer] walking around with her husband when she suddenly noticed that her lower extremities bilaterally.  Felt numb and tingling and she didn't feel well.  He walked towards the front to check out at which time her husband noticed that her eyes rolled back in her head and her upper body was shaking.  He lowered her to the ground where she continued having generalized upper body shaking.  He did not notice any shaking of the lower extremities.  She does not report any incontinence.  This episode lasted approximately 20 minutes.  The fire department arrived before EMS.  They did check her blood sugar which was in the 200s.  At that time in this arrived and transported her to the emergency department on their arrival, she had no seizure-like activity. He does not report any change in her medications.  She states she had a physical last month which was normal.  No medication changes.  She does report a vague head injury oncology at the age of 16 but none since.  No reported family history of seizures.  Patient is a 55 y.o. female presenting with seizures. The history is provided by the patient and the spouse.  Seizures Seizure activity on arrival: no   Seizure type:  Myoclonic and focal Preceding symptoms: dizziness, nausea and numbness   Preceding symptoms: no headache, no hyperventilation, no panic and no vision change   Initial focality:  Lower extremity Episode characteristics: abnormal movements, disorientation, stiffening and unresponsiveness   Episode characteristics: no incontinence,  no limpness and fully responsive   Postictal symptoms: confusion   Return to baseline: yes   Severity:  Unable to specify Duration:  20 minutes Timing:  Once Number of seizures this episode:  1 Progression:  Resolved Context: not change in medication, not drug use, medical compliance and not possible hypoglycemia   Recent head injury:  No recent head injuries History of seizures: no     Past Medical History  Diagnosis Date  . Mental disorder   . Depression   . Anxiety   . Anemia     history  . Arthritis     neck and lower back - deg disc disease  . SVD (spontaneous vaginal delivery)     x 3  . GERD (gastroesophageal reflux disease)    Past Surgical History  Procedure Laterality Date  . Appendectomy    . Cholecystectomy    . Gastric bypass    . Tubal ligation    . Anterior and posterior repair  09/25/2012    Procedure: ANTERIOR (CYSTOCELE) AND POSTERIOR REPAIR (RECTOCELE);  Surgeon: Lahoma Crocker, MD;  Location: [facility];  Service: Gynecology;  Laterality: N/A;  . Roux-en-y procedure N/A 2003   Family History  Problem Relation Age of Onset  . Breast cancer Maternal Aunt   . Stomach cancer Maternal Grandmother   . Stomach cancer Maternal Uncle   . Colon cancer Maternal Uncle     x3  . Colon polyps Mother   . Diabetes Maternal Grandmother   . Diabetes Maternal Aunt     Type I  . Diabetes Maternal Uncle  x5 - Type II  . Heart disease Maternal Grandmother   . Heart disease Paternal Grandmother   . Esophageal cancer Neg Hx   . Gallbladder disease Neg Hx    Social History  Substance Use Topics  . Smoking status: Never Smoker   . Smokeless tobacco: Never Used  . Alcohol Use: No     Comment: drinks daily - none since 08/2012 per pt.   OB History    No data available     Review of Systems  Constitutional: Negative for fever and chills.  HENT: Negative for congestion.   Eyes: Negative for photophobia and visual disturbance.  Respiratory: Negative for  cough and shortness of breath.   Cardiovascular: Negative for chest pain.  Gastrointestinal: Negative for abdominal pain and abdominal distention.  Musculoskeletal: Negative for myalgias and neck stiffness.  Skin: Negative for rash.  Neurological: Positive for dizziness, seizures and numbness. Negative for weakness.  Psychiatric/Behavioral: The patient is not nervous/anxious.   All other systems reviewed and are negative.     Allergies  Adhesive; Latex; and Sulfa drugs cross reactors  Home Medications   Prior to Admission medications   Medication Sig Start Date End Date Taking? Authorizing Provider  ALPRAZolam Duanne Moron) 0.5 MG tablet Take 0.5 mg by mouth 3 (three) times daily as needed for anxiety.    Yes Historical Provider, MD  aspirin-acetaminophen-caffeine (EXCEDRIN MIGRAINE) [id number] MG tablet Take 2 tablets by mouth 2 (two) times daily as needed for migraine.   Yes Historical Provider, MD  BIOTIN 5000 PO Take 5,000 mg by mouth.   Yes Historical Provider, MD  cimetidine (TAGAMET) 200 MG tablet Take 200 mg by mouth daily as needed (for heartburn).   Yes Historical Provider, MD  citalopram (CELEXA) 10 MG tablet Take 10 mg by mouth daily.   Yes Historical Provider, MD  diclofenac sodium (VOLTAREN) 1 % GEL Apply 2 g topically at bedtime.    Yes Historical Provider, MD  levothyroxine (SYNTHROID, LEVOTHROID) 75 MCG tablet Take 75 mcg by mouth daily.  09/29/14  Yes Historical Provider, MD  Melatonin 10 MG TABS Take 10 mg by mouth at bedtime.   Yes Historical Provider, MD  Multiple Vitamin (MULTIVITAMIN WITH MINERALS) TABS Take 1 tablet by mouth daily.   Yes Historical Provider, MD  zolpidem (AMBIEN) 10 MG tablet Take 10 mg by mouth at bedtime as needed for sleep.  10/29/15  Yes Historical Provider, MD   BP 134/73 mmHg  Pulse 79  Resp 20  SpO2 98% Physical Exam  Constitutional: She is oriented to person, place, and time. She appears well-developed and well-nourished. No distress.   HENT:  Head: Normocephalic.  Mouth/Throat: Oropharynx is clear and moist.  Eyes: EOM are normal. Pupils are equal, round, and reactive to light. Right conjunctiva is not injected.  Neck: Normal range of motion.  Cardiovascular: Normal rate and regular rhythm.   Pulmonary/Chest: Effort normal and breath sounds normal.  Abdominal: Soft. She exhibits no distension.  Musculoskeletal: Normal range of motion. She exhibits no edema or tenderness.  Lymphadenopathy:    She has no cervical adenopathy.  Neurological: She is alert and oriented to person, place, and time. She has normal strength. She is not disoriented. She displays normal reflexes. No cranial nerve deficit or sensory deficit. She displays a negative Romberg sign. Coordination normal. GCS eye subscore is 4. GCS verbal subscore is 5. GCS motor subscore is 6.  Skin: Skin is warm. No rash noted. She is not diaphoretic.  Psychiatric: She  has a normal mood and affect.  Nursing note and vitals reviewed.   ED Course  Procedures (including critical care time) Labs Review Labs Reviewed  CBC WITH DIFFERENTIAL/PLATELET - Abnormal; Notable for the following:    Hemoglobin 11.9 (*)    All other components within normal limits  URINALYSIS, ROUTINE W REFLEX MICROSCOPIC (NOT AT [facility]) - Abnormal; Notable for the following:    Hgb urine dipstick MODERATE (*)    Bilirubin Urine SMALL (*)    All other components within normal limits  URINE MICROSCOPIC-ADD ON - Abnormal; Notable for the following:    Squamous Epithelial / LPF 0-5 (*)    Bacteria, UA RARE (*)    All other components within normal limits  COMPREHENSIVE METABOLIC PANEL  CBC  CBG MONITORING, ED    Imaging Review Ct Head Wo Contrast  11/07/2015  CLINICAL DATA:  Seizure-like activity and pressure in the back of head. EXAM: CT HEAD WITHOUT CONTRAST TECHNIQUE: Contiguous axial images were obtained from the base of the skull through the vertex without intravenous contrast. COMPARISON:   05/08/2007 FINDINGS: Skull and Sinuses:Nonspecific fat reticulation in the high biparietal scalp without underlying fracture or destructive process. Right maxillary sinusitis with fluid level. Visualized orbits: Negative. Brain: Normal appearance. No evidence of acute infarction, hemorrhage, hydrocephalus, or mass lesion/mass effect. IMPRESSION: 1. Negative intracranial imaging.  No explanation for seizure. 2. Right maxillary sinusitis with fluid level. 3. Parietal scalp swelling, correlate for ecchymosis or infection. Electronically Signed   By: Monte Fantasia M.D.   On: 11/07/2015 22:08   Mr Brain Wo Contrast  11/08/2015  CLINICAL DATA:  Syncopal episode, seizure like activities for 20 minutes. History of depression. EXAM: MRI HEAD WITHOUT CONTRAST TECHNIQUE: Multiplanar, multiecho pulse sequences of the brain and surrounding structures were obtained without intravenous contrast. COMPARISON:  CT head October 07, 2015 FINDINGS: The ventricles and sulci are normal for patient's age. No abnormal parenchymal signal, mass lesions, mass effect. No reduced diffusion to suggest acute ischemia. No susceptibility artifact to suggest hemorrhage. Hippocampi demonstrate normal size, morphology and signal characteristics bilaterally. No abnormal extra-axial fluid collections. No extra-axial masses though, contrast enhanced sequences would be more sensitive. Normal major intracranial vascular flow voids seen at the skull base. Ocular globes and orbital contents are unremarkable though not tailored for evaluation. No abnormal sellar expansion. No suspicious calvarial bone marrow signal. Cerebellar tonsils at but not below the foramen magnum. Craniocervical junction maintained. RIGHT maxillary sinus air-fluid level. Small LEFT maxillary mucosal retention cyst. Mastoid air cells are well aerated. Mild swelling in and infiltrated subcutaneous fat RIGHT parietal scalp without focal fluid collection. IMPRESSION: Normal noncontrast  MRI brain. RIGHT maxillary sinusitis. RIGHT parietal scalp swelling. Electronically Signed   By: Elon Alas M.D.   On: 11/08/2015 01:39   I have personally reviewed and evaluated these images and lab results as part of my medical decision-making.   EKG Interpretation   Date/Time:  Friday November 07 2015 20:20:38 EST Ventricular Rate:  100 PR Interval:  135 QRS Duration: 98 QT Interval:  355 QTC Calculation: 458 R Axis:   41 Text Interpretation:  Sinus tachycardia Probable left atrial enlargement  Otherwise normal ECG Confirmed by KNOTT MD, DANIEL [id number]) on 11/07/2015  9:21:22 PM     Examination patient did have some noted twitching of her lower extremities and facial grimacing.  She was given a milligram of Ativan to lower her seizure threshold, to allow for for thorough evaluation Patient has been evaluated for seizure-like activity  with CT scan, MRI, labs, urine parameters.  She's been seen by Dr. Tressie Ellis the hospital, neurologist, who recommended an outpatient EEG.  She's been given strict return parameters as well as driving precautions MDM   Final diagnoses:  Seizure ([place])         Junius Creamer, NP 11/08/15 0302  Leo Grosser, MD 11/08/15 [phone]

## 2015-11-08 ENCOUNTER — Emergency Department (HOSPITAL_COMMUNITY): Payer: 59

## 2015-11-08 DIAGNOSIS — R569 Unspecified convulsions: Secondary | ICD-10-CM

## 2015-11-08 LAB — URINE MICROSCOPIC-ADD ON: WBC, UA: NONE SEEN WBC/hpf (ref 0–5)

## 2015-11-08 LAB — CBC WITH DIFFERENTIAL/PLATELET
Basophils Absolute: 0 10*3/uL (ref 0.0–0.1)
Basophils Relative: 0 %
Eosinophils Absolute: 0.1 10*3/uL (ref 0.0–0.7)
Eosinophils Relative: 1 %
HCT: 36 % (ref 36.0–46.0)
Hemoglobin: 11.9 g/dL — ABNORMAL LOW (ref 12.0–15.0)
Lymphocytes Relative: 26 %
Lymphs Abs: 2.7 10*3/uL (ref 0.7–4.0)
MCH: 29 pg (ref 26.0–34.0)
MCHC: 33.1 g/dL (ref 30.0–36.0)
MCV: 87.8 fL (ref 78.0–100.0)
Monocytes Absolute: 0.6 10*3/uL (ref 0.1–1.0)
Monocytes Relative: 6 %
Neutro Abs: 7 10*3/uL (ref 1.7–7.7)
Neutrophils Relative %: 67 %
Platelets: 256 10*3/uL (ref 150–400)
RBC: 4.1 MIL/uL (ref 3.87–5.11)
RDW: 14.3 % (ref 11.5–15.5)
WBC: 10.4 10*3/uL (ref 4.0–10.5)

## 2015-11-08 LAB — URINALYSIS, ROUTINE W REFLEX MICROSCOPIC
Glucose, UA: NEGATIVE mg/dL
Ketones, ur: NEGATIVE mg/dL
Leukocytes, UA: NEGATIVE
Nitrite: NEGATIVE
Protein, ur: NEGATIVE mg/dL
Specific Gravity, Urine: 1.016 (ref 1.005–1.030)
pH: 5.5 (ref 5.0–8.0)

## 2015-11-08 NOTE — Discharge Instructions (Signed)
°  Today you were evaluated for first-time seizure your CT scan and MRI do not show any brain abnormality.  Electrolytes and blood work are all within normal parameters.  You've been seen by Dr. Nicole Kindred the hospital, neurologist, who reviewed your studies and lab values is recommended an outpatient EEG.  This can be arranged through your primary care physician.  You've also been given referrals to 2 different neurology groups.  Please call and make an appointment, with, which however, your primary care physician prefers or whomever you can get into first. Is always recommended that you do not drive onto you have further evaluation.  This is for your safety as well as for everybody else on the highways. If at anytime you having another seizure or become concerned prior to further outpatient evaluation.  Please return immediately to the emergency department for further evaluation   Seizure, Adult A seizure means there is unusual activity in the brain. A seizure can cause changes in attention or behavior. Seizures often cause shaking (convulsions). Seizures often last from 30 seconds to 2 minutes. HOME CARE   If you are given medicines, take them exactly as told by your doctor.  Keep all doctor visits as told.  Do not swim or drive until your doctor says it is okay.  Teach others what to do if you have a seizure. They should:  Lay you on the ground.  Put a cushion under your head.  Loosen any tight clothing around your neck.  Turn you on your side.  Stay with you until you get better. GET HELP RIGHT AWAY IF:   The seizure lasts longer than 2 to 5 minutes.  The seizure is very bad.  The person does not wake up after the seizure.  The person's attention or behavior changes. Drive the person to the emergency room or call your local emergency services (911 in [place] MAKE SURE YOU:   Understand these instructions.  Will watch your condition.  Will get help right away if you are not  doing well or get worse.   This information is not intended to replace advice given to you by your health care provider. Make sure you discuss any questions you have with your health care provider.   Document Released: 03/08/2008 Document Revised: 12/13/2011 Document Reviewed: 05/02/2013 Elsevier Interactive Patient Education [employer].

## 2015-11-08 NOTE — Consult Note (Signed)
Admission H&P    Chief Complaint: Episode of loss of consciousness.  HPI: Tammy [name] is an 55 y.o. female with a history of depression and anxiety brought to the emergency room following an episode of loss of consciousness during which she exhibited tonic and clonic activity of extremities and was confused on awakening. Patient recalls feeling of tingling involving her legs followed by tunnel vision prior to losing consciousness. She reportedly was unconscious for about 20 minutes. CT scan of her head showed no acute intracranial abnormality. She has no previous history of seizure or syncope. Blood sugar was 100. Electrolytes were normal. She takes Xanax daily and has not missed any doses. She only infrequently drinks alcohol.  Past Medical History  Diagnosis Date  . Mental disorder   . Depression   . Anxiety   . Anemia     history  . Arthritis     neck and lower back - deg disc disease  . SVD (spontaneous vaginal delivery)     x 3  . GERD (gastroesophageal reflux disease)     Past Surgical History  Procedure Laterality Date  . Appendectomy    . Cholecystectomy    . Gastric bypass    . Tubal ligation    . Anterior and posterior repair  09/25/2012    Procedure: ANTERIOR (CYSTOCELE) AND POSTERIOR REPAIR (RECTOCELE);  Surgeon: Lahoma Crocker, MD;  Location: [facility];  Service: Gynecology;  Laterality: N/A;  . Roux-en-y procedure N/A 2003    Family History  Problem Relation Age of Onset  . Breast cancer Maternal Aunt   . Stomach cancer Maternal Grandmother   . Stomach cancer Maternal Uncle   . Colon cancer Maternal Uncle     x3  . Colon polyps Mother   . Diabetes Maternal Grandmother   . Diabetes Maternal Aunt     Type I  . Diabetes Maternal Uncle     x5 - Type II  . Heart disease Maternal Grandmother   . Heart disease Paternal Grandmother   . Esophageal cancer Neg Hx   . Gallbladder disease Neg Hx    Social History:  reports that she has never smoked. She has never  used smokeless tobacco. She reports that she does not drink alcohol or use illicit drugs.  Allergies:  Allergies  Allergen Reactions  . Adhesive [Tape] Itching and Rash  . Latex Itching and Rash  . Sulfa Drugs Cross Reactors Itching and Rash    Medications: Patient's preadmission medications were reviewed by me.  ROS: History obtained from spouse and the patient  General ROS: negative for - chills, fatigue, fever, night sweats, weight gain or weight loss Psychological ROS: negative for - behavioral disorder, hallucinations, memory difficulties, mood swings or suicidal ideation Ophthalmic ROS: negative for - blurry vision, double vision, eye pain or loss of vision ENT ROS: negative for - epistaxis, nasal discharge, oral lesions, sore throat, tinnitus or vertigo Allergy and Immunology ROS: negative for - hives or itchy/watery eyes Hematological and Lymphatic ROS: negative for - bleeding problems, bruising or swollen lymph nodes Endocrine ROS: negative for - galactorrhea, hair pattern changes, polydipsia/polyuria or temperature intolerance Respiratory ROS: negative for - cough, hemoptysis, shortness of breath or wheezing Cardiovascular ROS: negative for - chest pain, dyspnea on exertion, edema or irregular heartbeat Gastrointestinal ROS: negative for - abdominal pain, diarrhea, hematemesis, nausea/vomiting or stool incontinence Genito-Urinary ROS: negative for - dysuria, hematuria, incontinence or urinary frequency/urgency Musculoskeletal ROS: negative for - joint swelling or muscular weakness Neurological  ROS: as noted in HPI Dermatological ROS: negative for rash and skin lesion changes  Physical Examination: Blood pressure 128/75, pulse 98, resp. rate 20, SpO2 96 %.  HEENT-  Normocephalic, no lesions, without obvious abnormality.  Normal external eye and conjunctiva.  Normal TM's bilaterally.  Normal auditory canals and external ears. Normal external nose, mucus membranes and septum.   Normal pharynx. Neck supple with no masses, nodes, nodules or enlargement. Cardiovascular - regular rate and rhythm, S1, S2 normal, no murmur, click, rub or gallop Lungs - chest clear, no wheezing, rales, normal symmetric air entry Abdomen - soft, non-tender; bowel sounds normal; no masses,  no organomegaly Extremities - no joint deformities, effusion, or inflammation and no edema  Neurologic Examination: Mental Status: Alert, oriented, thought content appropriate.  Speech fluent without evidence of aphasia. Able to follow commands without difficulty. Cranial Nerves: II-Visual fields were normal. III/IV/VI-Pupils were equal and reacted normally to light. Extraocular movements were full and conjugate.    V/VII-no facial numbness and no facial weakness. VIII-normal. X-normal speech and symmetrical palatal movement. XI: trapezius strength/neck flexion strength normal bilaterally XII-midline tongue extension with normal strength. Motor: 5/5 bilaterally with normal tone and bulk Sensory: Normal throughout. Deep Tendon Reflexes: 2+ and symmetric. Plantars: Flexor bilaterally Cerebellar: Normal finger-to-nose testing. Carotid auscultation: Normal  Results for orders placed or performed during the hospital encounter of 11/07/15 (from the past 48 hour(s))  CBG monitoring, ED     Status: None   Collection Time: 11/07/15  8:25 PM  Result Value Ref Range   Glucose-Capillary 97 65 - 99 mg/dL   Comment 1 Notify RN    Comment 2 Document in Chart   Comprehensive metabolic panel     Status: None   Collection Time: 11/07/15 11:26 PM  Result Value Ref Range   Sodium 144 135 - 145 mmol/L   Potassium 3.5 3.5 - 5.1 mmol/L   Chloride 106 101 - 111 mmol/L   CO2 24 22 - 32 mmol/L   Glucose, Bld 90 65 - 99 mg/dL   BUN 9 6 - 20 mg/dL   Creatinine, Ser 0.91 0.44 - 1.00 mg/dL   Calcium 9.2 8.9 - 10.3 mg/dL   Total Protein 6.5 6.5 - 8.1 g/dL   Albumin 3.7 3.5 - 5.0 g/dL   AST 23 15 - 41 U/L   ALT 15  14 - 54 U/L   Alkaline Phosphatase 65 38 - 126 U/L   Total Bilirubin 0.4 0.3 - 1.2 mg/dL   GFR calc non Af Amer >60 >60 mL/min   GFR calc Af Amer >60 >60 mL/min    Comment: (NOTE) The eGFR has been calculated using the CKD EPI equation. This calculation has not been validated in all clinical situations. eGFR's persistently <60 mL/min signify possible Chronic Kidney Disease.    Anion gap 14 5 - 15  CBC with Differential     Status: Abnormal   Collection Time: 11/07/15 11:45 PM  Result Value Ref Range   WBC 10.4 4.0 - 10.5 K/uL   RBC 4.10 3.87 - 5.11 MIL/uL   Hemoglobin 11.9 (L) 12.0 - 15.0 g/dL   HCT 36.0 36.0 - 46.0 %   MCV 87.8 78.0 - 100.0 fL   MCH 29.0 26.0 - 34.0 pg   MCHC 33.1 30.0 - 36.0 g/dL   RDW 14.3 11.5 - 15.5 %   Platelets 256 150 - 400 K/uL   Neutrophils Relative % 67 %   Neutro Abs 7.0 1.7 - 7.7 K/uL  Lymphocytes Relative 26 %   Lymphs Abs 2.7 0.7 - 4.0 K/uL   Monocytes Relative 6 %   Monocytes Absolute 0.6 0.1 - 1.0 K/uL   Eosinophils Relative 1 %   Eosinophils Absolute 0.1 0.0 - 0.7 K/uL   Basophils Relative 0 %   Basophils Absolute 0.0 0.0 - 0.1 K/uL   Ct Head Wo Contrast  11/07/2015  CLINICAL DATA:  Seizure-like activity and pressure in the back of head. EXAM: CT HEAD WITHOUT CONTRAST TECHNIQUE: Contiguous axial images were obtained from the base of the skull through the vertex without intravenous contrast. COMPARISON:  05/08/2007 FINDINGS: Skull and Sinuses:Nonspecific fat reticulation in the high biparietal scalp without underlying fracture or destructive process. Right maxillary sinusitis with fluid level. Visualized orbits: Negative. Brain: Normal appearance. No evidence of acute infarction, hemorrhage, hydrocephalus, or mass lesion/mass effect. IMPRESSION: 1. Negative intracranial imaging.  No explanation for seizure. 2. Right maxillary sinusitis with fluid level. 3. Parietal scalp swelling, correlate for ecchymosis or infection. Electronically Signed   By:  Monte Fantasia M.D.   On: 11/07/2015 22:08    Assessment/Plan 55 year old lady presenting with new onset seizure activity was a witnessed generalized seizure. Clinical examination, CT scan of her head and laboratory studies were all unremarkable.  Recommendations: 1. MRI of the brain without and with contrast 2. EEG, routine about study 3. No indication for anticonvulsant medication at this point  We will continue to follow this patient with you.  C.R. Nicole Kindred, MD [facility] [phone]  11/08/2015, 12:38 AM

## 2015-11-08 NOTE — ED Notes (Signed)
Patient up to restroom to obtain urine sample.  Gait steady and even.  Pt denies any dizziness, just states "my legs feel tight from lying in the bed".

## 2015-11-08 NOTE — ED Notes (Signed)
Patient able to dress and ambulate independently 

## 2015-11-10 ENCOUNTER — Ambulatory Visit: Payer: 59 | Admitting: Neurology

## 2015-11-10 ENCOUNTER — Telehealth: Payer: Self-pay | Admitting: *Deleted

## 2015-11-10 DIAGNOSIS — R1313 Dysphagia, pharyngeal phase: Secondary | ICD-10-CM | POA: Insufficient documentation

## 2015-11-10 DIAGNOSIS — K219 Gastro-esophageal reflux disease without esophagitis: Secondary | ICD-10-CM | POA: Insufficient documentation

## 2015-11-10 NOTE — Telephone Encounter (Signed)
Late for new patient appt. Had to r/s.

## 2015-11-12 ENCOUNTER — Ambulatory Visit (INDEPENDENT_AMBULATORY_CARE_PROVIDER_SITE_OTHER): Payer: 59 | Admitting: Neurology

## 2015-11-12 ENCOUNTER — Encounter: Payer: Self-pay | Admitting: Neurology

## 2015-11-12 ENCOUNTER — Telehealth: Payer: Self-pay | Admitting: *Deleted

## 2015-11-12 VITALS — BP 133/84 | HR 89 | Ht 65.0 in | Wt 247.4 lb

## 2015-11-12 DIAGNOSIS — R569 Unspecified convulsions: Secondary | ICD-10-CM

## 2015-11-12 DIAGNOSIS — F411 Generalized anxiety disorder: Secondary | ICD-10-CM | POA: Diagnosis not present

## 2015-11-12 DIAGNOSIS — F329 Major depressive disorder, single episode, unspecified: Secondary | ICD-10-CM

## 2015-11-12 DIAGNOSIS — F32A Depression, unspecified: Secondary | ICD-10-CM

## 2015-11-12 MED ORDER — TRAMADOL HCL 50 MG PO TABS: 50.0000 mg | ORAL_TABLET | Freq: Four times a day (QID) | ORAL | Status: DC | PRN

## 2015-11-12 NOTE — Progress Notes (Signed)
[facility]    Provider:  Dr Jaynee Eagles Referring Provider: Stephens Shire, MD Primary Care Physician:  Stephens Shire, MD  CC:  Seizure  HPI:  Tammy [name] is a 55 y.o. female here as a referral from Dr. Tollie Pizza for a seizure in the setting of stress. PMHx of depression, anxiety.  Lots of job stress and husband is on dialysis waiting for a kidney. Patient says she was at [employer] with her husband and she felt nauseated and hot which sometimes happens, she hadn't slept well maybe a few hours a night she felt pins and needles sensation, her body felt buzzing, her vision started narrowing like tunnel vision, she felt like she was going faint and doesn't remember anything until she woke up on the gurney while still in [employer]. No previous illnesses, no new medications. Husband is here and provides most information, the next thing he knows her arms came up while standing and he could not push them down, she did not fall just standing with arms straight out, eyes closed, she clenched her teeth, bit her lip. Husband lowered patient to the gound, not responsive, eyes closed. She was panting and blowing out throught clenched teeth. Head was shaking back and forth, arms straight up pointing to the ceiling, she bit her lip, blood ran down her mouth. Lasted for two minutes. She then laid there still, eyes closed with heavy breathing but no longer clenching, no responding and not opening eyes. 10 minutes later her BP was 210/155. About 20 minutes she was confused. No previous or FHx of seizures.   Reviewed notes, labs and imaging from outside physicians, which showed: She was seen in the [facility] ED 11/08/2015 for a seizure. Neurology was consulted. She was brought to the emergency room following an episode of loss of consciousness during which she exhibited tonic and clonic activity of the extremities and was confused on awakening. She was reportedly unconscious for about 20 minutes. CT scan of the head  was unremarkable. No previous history. Blood sugar was 100. Electrolytes were normal. MRI of the brain was ordered. EEG outpatient was suggested. Neuro did not start her on anticonvulsant medications.  MRI of the brain was unremarkable. Had mild swelling in the right parietal scalp. Personally reviewed images.   Review of Systems: Patient complains of symptoms per HPI as well as the following symptoms: weight gain, blurred vision, easy bruising, feeling hot, feeling cold, joint pain, aching muscles, memory loss, confusion, headache, numbness, weakness, slurred speech, dizziness, seizure, passing out, depression, anxiety, not enough sleep.Marland Kitchen Pertinent negatives per HPI. All others negative.   Social History   Social History  . Marital Status: Married    Spouse Name: Rhodie Olinde  . Number of Children: 3  . Years of Education: 16   Occupational History  . [employer]    Social History Main Topics  . Smoking status: Never Smoker   . Smokeless tobacco: Never Used  . Alcohol Use: No     Comment: drinks daily - none since 08/2012 per pt.  . Drug Use: No  . Sexual Activity: Yes    Birth Control/ Protection: Surgical   Other Topics Concern  . Not on file   Social History Narrative   Lives with spouse   Caffeine use: 2-3 cups per day    Family History  Problem Relation Age of Onset  . Breast cancer Maternal Aunt   . Stomach cancer Maternal Grandmother   . Diabetes Maternal Grandmother   .  Heart disease Maternal Grandmother   . Stomach cancer Maternal Uncle   . Colon cancer Maternal Uncle     x3  . Colon polyps Mother   . Diabetes Maternal Aunt     Type I  . Diabetes Maternal Uncle     x5 - Type II  . Heart disease Paternal Grandmother   . Esophageal cancer Neg Hx   . Gallbladder disease Neg Hx   . Seizures Neg Hx     Past Medical History  Diagnosis Date  . Mental disorder   . Depression   . Anxiety   . Anemia     history  . Arthritis     neck and lower back - deg  disc disease  . SVD (spontaneous vaginal delivery)     x 3  . GERD (gastroesophageal reflux disease)     Past Surgical History  Procedure Laterality Date  . Appendectomy    . Cholecystectomy    . Gastric bypass    . Tubal ligation    . Anterior and posterior repair  09/25/2012    Procedure: ANTERIOR (CYSTOCELE) AND POSTERIOR REPAIR (RECTOCELE);  Surgeon: Lahoma Crocker, MD;  Location: [facility];  Service: Gynecology;  Laterality: N/A;  . Roux-en-y procedure N/A 2003    Current Outpatient Prescriptions  Medication Sig Dispense Refill  . ALPRAZolam (XANAX) 0.5 MG tablet Take 0.5 mg by mouth 3 (three) times daily as needed for anxiety.     Marland Kitchen aspirin-acetaminophen-caffeine (EXCEDRIN MIGRAINE) 250-250-65 MG tablet Take 2 tablets by mouth 2 (two) times daily as needed for migraine.    Marland Kitchen BIOTIN 5000 PO Take 5,000 mg by mouth.    . cimetidine (TAGAMET) 200 MG tablet Take 200 mg by mouth daily as needed (for heartburn).    . diclofenac sodium (VOLTAREN) 1 % GEL Apply 2 g topically at bedtime.     Marland Kitchen levothyroxine (SYNTHROID, LEVOTHROID) 75 MCG tablet Take 75 mcg by mouth daily.   11  . Melatonin 10 MG TABS Take 10 mg by mouth at bedtime.    . Multiple Vitamin (MULTIVITAMIN WITH MINERALS) TABS Take 1 tablet by mouth daily.    . sertraline (ZOLOFT) 50 MG tablet Take 50 mg by mouth daily.    Marland Kitchen zolpidem (AMBIEN) 10 MG tablet Take 10 mg by mouth at bedtime as needed for sleep.   5  . traMADol (ULTRAM) 50 MG tablet Take 1 tablet (50 mg total) by mouth every 6 (six) hours as needed. 30 tablet 2   No current facility-administered medications for this visit.    Allergies as of 11/12/2015 - Review Complete 11/12/2015  Allergen Reaction Noted  . Adhesive [tape] Itching and Rash 09/13/2012  . Latex Itching and Rash 04/22/2012  . Sulfa drugs cross reactors Itching and Rash 04/22/2012    Vitals: BP 133/84 mmHg  Pulse 89  Ht 5\' 5"  (1.651 m)  Wt 247 lb 6.4 oz (112.22 kg)  BMI 41.17 kg/m2 Last  Weight:  Wt Readings from Last 1 Encounters:  11/12/15 247 lb 6.4 oz (112.22 kg)   Last Height:   Ht Readings from Last 1 Encounters:  11/12/15 5\' 5"  (1.651 m)     Physical exam: Exam: Gen: NAD, conversant, well nourised, obese, well groomed                     CV: RRR, no MRG. No Carotid Bruits. No peripheral edema, warm, nontender Eyes: Conjunctivae clear without exudates or hemorrhage  Neuro: Detailed Neurologic  Exam  Speech:    Speech is normal; fluent and spontaneous with normal comprehension.  Cognition:    The patient is oriented to person, place, and time;     recent and remote memory intact;     language fluent;     normal attention, concentration,     fund of knowledge Cranial Nerves:    The pupils are equal, round, and reactive to light. The fundi are normal and spontaneous venous pulsations are present. Visual fields are full to finger confrontation. Extraocular movements are intact. Trigeminal sensation is intact and the muscles of mastication are normal. The face is symmetric. The palate elevates in the midline. Hearing intact. Voice is normal. Shoulder shrug is normal. The tongue has normal motion without fasciculations.   Coordination:    Normal finger to nose and heel to shin. Normal rapid alternating movements.   Gait:    Heel-toe and tandem gait are normal.   Motor Observation:    No asymmetry, no atrophy, and no involuntary movements noted. Tone:    Normal muscle tone.    Posture:    Posture is normal. normal erect    Strength:    Strength is V/V in the upper and lower limbs.      Sensation: intact to LT     Reflex Exam:  DTR's:    Deep tendon reflexes in the upper and lower extremities are normal bilaterally.   Toes:    The toes are downgoing bilaterally.   Clonus:    Clonus is absent.     Assessment/Plan:  55 year old female with witnessed seizure-like episode in the setting of extreme stress and significantly elevated BP. Some of the  description not c/w seizure such as standing with arms outstretched and eyes closed, feeling faint with tunnel vision. She did not fall but was able to be lowered to the ground. However she did bite her lip and suffered a mild head contusion. Will order EEG, then can consider 3-day eeg. Neuro exam is non focal.  Patient is unable to drive, operate heavy machinery, perform activities at heights or participate in water activities until 6 months seizure free Sleep-deprived EEG No AEDs at this time.  Discussed with husband and wife and they agree.   CC: Dr. Maricela Bo, MD  [facility] [address] [place], [place] [address]  Phone [phone] Fax [phone]

## 2015-11-12 NOTE — Telephone Encounter (Signed)
Patient Tammy [name] claim form on [employer].

## 2015-11-12 NOTE — Patient Instructions (Addendum)
Remember to drink plenty of fluid, eat healthy meals and do not skip any meals. Try to eat protein with a every meal and eat a healthy snack such as fruit or nuts in between meals. Try to keep a regular sleep-wake schedule and try to exercise daily, particularly in the form of walking, 20-30 minutes a day, if you can.   As far as your medications are concerned, I would like to suggest: none at this time  As far as diagnostic testing: EEG Tramadol as needed for pain  Our phone number is [phone]. We also have an after hours call service for urgent matters and there is a physician on-call for urgent questions. For any emergencies you know to call 911 or go to the nearest emergency room

## 2015-11-13 ENCOUNTER — Ambulatory Visit
Admission: RE | Admit: 2015-11-13 | Discharge: 2015-11-13 | Disposition: A | Discharge status: Home / Self Care | Payer: 59 | Source: Ambulatory Visit

## 2015-11-13 DIAGNOSIS — Z1231 Encounter for screening mammogram for malignant neoplasm of breast: Secondary | ICD-10-CM

## 2015-11-13 DIAGNOSIS — Z0289 Encounter for other administrative examinations: Secondary | ICD-10-CM

## 2015-11-16 ENCOUNTER — Encounter: Payer: Self-pay | Admitting: Neurology

## 2015-11-16 DIAGNOSIS — F411 Generalized anxiety disorder: Secondary | ICD-10-CM | POA: Insufficient documentation

## 2015-11-16 DIAGNOSIS — F329 Major depressive disorder, single episode, unspecified: Secondary | ICD-10-CM | POA: Insufficient documentation

## 2015-11-16 DIAGNOSIS — F32A Depression, unspecified: Secondary | ICD-10-CM | POA: Insufficient documentation

## 2015-11-16 DIAGNOSIS — R569 Unspecified convulsions: Secondary | ICD-10-CM | POA: Insufficient documentation

## 2015-11-25 NOTE — Telephone Encounter (Signed)
Gave copy to MR to be faxed. Placed copy up front for pt pick up.

## 2015-11-25 NOTE — Telephone Encounter (Signed)
Pt called to check on her FMLA paper work. She would like a call back about what is being put on her paper work.

## 2015-11-25 NOTE — Telephone Encounter (Signed)
Called pt back. Advised FMLA paperwork ready, Dr Jaynee Eagles just needs to sign it. She would like it faxed and she would like to pick up copy in office. Advised I will leave copy up front for her to pick up. She verbalized understanding. Advised she wrote her out through tomorrow.

## 2015-11-26 ENCOUNTER — Telehealth: Payer: Self-pay | Admitting: *Deleted

## 2015-11-26 NOTE — Telephone Encounter (Signed)
Patient wants a copy of her form the [employer] form  is at the front desk.

## 2015-11-26 NOTE — Telephone Encounter (Signed)
Patient form faxed on 11/26/15 to [place].

## 2015-12-04 ENCOUNTER — Telehealth: Payer: Self-pay | Admitting: *Deleted

## 2015-12-04 ENCOUNTER — Ambulatory Visit (INDEPENDENT_AMBULATORY_CARE_PROVIDER_SITE_OTHER): Payer: 59 | Admitting: Neurology

## 2015-12-04 DIAGNOSIS — R569 Unspecified convulsions: Secondary | ICD-10-CM

## 2015-12-04 NOTE — Procedures (Signed)
    History:  Tammy [name] is a 55 year old patient with a history of a syncopal event associated with stress, lack of sleep. The patient felt a buzzing sensation, had tunnel vision, and apparently lost consciousness. The patient is being evaluated for this blackout.  This is a routine EEG. No skull defects are noted. Medications include Xanax, Excedrin Migraine, cimetidine, diclofenac, melatonin, multivitamins, Zoloft, Ultram, and Ambien.   EEG classification: Normal awake  Description of the recording: The background rhythms of this recording consists of a fairly well modulated medium amplitude alpha rhythm of 9 Hz that is reactive to eye opening and closure. As the record progresses, the patient appears to remain in the waking state throughout the recording. Photic stimulation was performed, resulting in a bilateral and symmetric photic driving response. Hyperventilation was also performed, resulting in a minimal buildup of the background rhythm activities without significant slowing seen. At no time during the recording does there appear to be evidence of spike or spike wave discharges or evidence of focal slowing. EKG monitor shows no evidence of cardiac rhythm abnormalities with a heart rate of 84.  Impression: This is a normal EEG recording in the waking state. No evidence of ictal or interictal discharges are seen.

## 2015-12-04 NOTE — Telephone Encounter (Signed)
Called and spoke to pt about normal EEG per Dr Jaynee Eagles. Pt verbalized understanding.

## 2015-12-04 NOTE — Telephone Encounter (Signed)
-----   Message from Melvenia Beam, MD sent at 12/04/2015  5:35 PM EST ----- EEG normal

## 2016-02-09 ENCOUNTER — Ambulatory Visit: Payer: 59 | Admitting: Neurology

## 2016-03-31 ENCOUNTER — Emergency Department (HOSPITAL_COMMUNITY)
Admission: EM | Admit: 2016-03-31 | Discharge: 2016-03-31 | Disposition: A | Discharge status: Home / Self Care | Payer: 59 | Attending: Emergency Medicine | Admitting: Emergency Medicine

## 2016-03-31 ENCOUNTER — Encounter (HOSPITAL_COMMUNITY): Payer: Self-pay | Admitting: Emergency Medicine

## 2016-03-31 ENCOUNTER — Emergency Department (HOSPITAL_COMMUNITY): Payer: 59

## 2016-03-31 DIAGNOSIS — M79645 Pain in left finger(s): Secondary | ICD-10-CM | POA: Diagnosis present

## 2016-03-31 DIAGNOSIS — F329 Major depressive disorder, single episode, unspecified: Secondary | ICD-10-CM | POA: Diagnosis not present

## 2016-03-31 DIAGNOSIS — M25532 Pain in left wrist: Secondary | ICD-10-CM

## 2016-03-31 DIAGNOSIS — Y999 Unspecified external cause status: Secondary | ICD-10-CM | POA: Diagnosis not present

## 2016-03-31 DIAGNOSIS — M1990 Unspecified osteoarthritis, unspecified site: Secondary | ICD-10-CM | POA: Insufficient documentation

## 2016-03-31 DIAGNOSIS — Y9389 Activity, other specified: Secondary | ICD-10-CM | POA: Insufficient documentation

## 2016-03-31 DIAGNOSIS — Y929 Unspecified place or not applicable: Secondary | ICD-10-CM | POA: Insufficient documentation

## 2016-03-31 DIAGNOSIS — Z7982 Long term (current) use of aspirin: Secondary | ICD-10-CM | POA: Insufficient documentation

## 2016-03-31 DIAGNOSIS — W1839XA Other fall on same level, initial encounter: Secondary | ICD-10-CM | POA: Diagnosis not present

## 2016-03-31 MED ORDER — MELOXICAM 7.5 MG PO TABS: 15.0000 mg | ORAL_TABLET | Freq: Every day | ORAL | Status: DC

## 2016-03-31 NOTE — ED Notes (Signed)
Pt c/o thumb pain onset 03/29/16 after falling off lawn mower. Point tenderness to snuff box.

## 2016-03-31 NOTE — ED Notes (Signed)
OT at bedside. 

## 2016-03-31 NOTE — Discharge Instructions (Signed)
Your x-rays were negative. You were placed in a splint to help with your discomfort and in case there is a fracture we cannot see yet. Please take your medication as prescribed. Follow-up with your orthopedist/[facility] for reevaluation in one or 2 days. Return to ED for any new or worsening symptoms.  Wrist Pain There are many things that can cause wrist pain. Some common causes include:  An injury to the wrist area, such as a sprain, strain, or fracture.  Overuse of the joint.  A condition that causes increased pressure on a nerve in the wrist (carpal tunnel syndrome).  Wear and tear of the joints that occurs with aging (osteoarthritis).  A variety of other types of arthritis. Sometimes, the cause of wrist pain is not known. The pain often goes away when you follow your health care provider's instructions for relieving pain at home. If your wrist pain continues, tests may need to be done to diagnose your condition. HOME CARE INSTRUCTIONS Pay attention to any changes in your symptoms. Take these actions to help with your pain:  Rest the wrist area for at least 48 hours or as told by your health care provider.  If directed, apply ice to the injured area:  Put ice in a plastic bag.  Place a towel between your skin and the bag.  Leave the ice on for 20 minutes, 2-3 times per day.  Keep your arm raised (elevated) above the level of your heart while you are sitting or lying down.  If a splint or elastic bandage has been applied, use it as told by your health care provider.  Remove the splint or bandage only as told by your health care provider.  Loosen the splint or bandage if your fingers become numb or have a tingling feeling, or if they turn cold or blue.  Take over-the-counter and prescription medicines only as told by your health care provider.  Keep all follow-up visits as told by your health care provider. This is important. SEEK MEDICAL CARE IF:  Your pain is  not helped by treatment.  Your pain gets worse. SEEK IMMEDIATE MEDICAL CARE IF:  Your fingers become swollen.  Your fingers turn white, very red, or cold and blue.  Your fingers are numb or have a tingling feeling.  You have difficulty moving your fingers.   This information is not intended to replace advice given to you by your health care provider. Make sure you discuss any questions you have with your health care provider.   Document Released: 06/30/2005 Document Revised: 06/11/2015 Document Reviewed: 02/05/2015 Elsevier Interactive Patient Education [employer].

## 2016-03-31 NOTE — ED Provider Notes (Signed)
CSN: [id number]     Arrival date & time 03/31/16  1552 History  By signing my name below, I, Julien Nordmann, attest that this documentation has been prepared under the direction and in the presence of Eulas Post, PA-C.  Electronically Signed: Julien Nordmann, ED Scribe. 03/31/2016. 5:53 PM.    Chief Complaint  Patient presents with  . Finger Injury      The history is provided by the patient. No language interpreter was used.   HPI Comments: Tammy [name] is a 55 y.o. female who has a PMHx of depression, anxiety, SVD and GERD presents to the Emergency Department complaining of sudden onset, gradual worsening, moderate left thumb pain onset two days ago. Pt reports that she was mowing the lawn when it was slippery outside when she fell and the lawn mower fell on top of her and bent her left thumb back. She notes having associated weakness when trying to grab certain items. Pt has taken advil to alleviate her pain with no relief. She denies numbness.No other modifying factors. States she does have an appointment next week with her husbands orthopedist  and will be able to receive reevaluation at that time.  Past Medical History  Diagnosis Date  . Mental disorder   . Depression   . Anxiety   . Anemia     history  . Arthritis     neck and lower back - deg disc disease  . SVD (spontaneous vaginal delivery)     x 3  . GERD (gastroesophageal reflux disease)    Past Surgical History  Procedure Laterality Date  . Appendectomy    . Cholecystectomy    . Gastric bypass    . Tubal ligation    . Anterior and posterior repair  09/25/2012    Procedure: ANTERIOR (CYSTOCELE) AND POSTERIOR REPAIR (RECTOCELE);  Surgeon: Lahoma Crocker, MD;  Location: [facility];  Service: Gynecology;  Laterality: N/A;  . Roux-en-y procedure N/A 2003   Family History  Problem Relation Age of Onset  . Breast cancer Maternal Aunt   . Stomach cancer Maternal Grandmother   . Diabetes Maternal Grandmother   .  Heart disease Maternal Grandmother   . Stomach cancer Maternal Uncle   . Colon cancer Maternal Uncle     x3  . Colon polyps Mother   . Diabetes Maternal Aunt     Type I  . Diabetes Maternal Uncle     x5 - Type II  . Heart disease Paternal Grandmother   . Esophageal cancer Neg Hx   . Gallbladder disease Neg Hx   . Seizures Neg Hx    Social History  Substance Use Topics  . Smoking status: Never Smoker   . Smokeless tobacco: Never Used  . Alcohol Use: No     Comment: drinks daily - none since 08/2012 per pt.   OB History    No data available     Review of Systems  A complete 10 system review of systems was obtained and all systems are negative except as noted in the HPI and PMH.    Allergies  Adhesive; Latex; and Sulfa drugs cross reactors  Home Medications   Prior to Admission medications   Medication Sig Start Date End Date Taking? Authorizing Provider  ALPRAZolam Duanne Moron) 0.5 MG tablet Take 0.5 mg by mouth 3 (three) times daily as needed for anxiety.     Historical Provider, MD  aspirin-acetaminophen-caffeine (EXCEDRIN MIGRAINE) [phone] MG tablet Take 2 tablets by mouth 2 (two)  times daily as needed for migraine.    Historical Provider, MD  BIOTIN 5000 PO Take 5,000 mg by mouth.    Historical Provider, MD  cimetidine (TAGAMET) 200 MG tablet Take 200 mg by mouth daily as needed (for heartburn).    Historical Provider, MD  diclofenac sodium (VOLTAREN) 1 % GEL Apply 2 g topically at bedtime.     Historical Provider, MD  levothyroxine (SYNTHROID, LEVOTHROID) 75 MCG tablet Take 75 mcg by mouth daily.  09/29/14   Historical Provider, MD  Melatonin 10 MG TABS Take 10 mg by mouth at bedtime.    Historical Provider, MD  meloxicam (MOBIC) 7.5 MG tablet Take 2 tablets (15 mg total) by mouth daily. 03/31/16   Comer Locket, PA-C  Multiple Vitamin (MULTIVITAMIN WITH MINERALS) TABS Take 1 tablet by mouth daily.    Historical Provider, MD  sertraline (ZOLOFT) 50 MG tablet Take 50 mg  by mouth daily.    Historical Provider, MD  traMADol (ULTRAM) 50 MG tablet Take 1 tablet (50 mg total) by mouth every 6 (six) hours as needed. 11/12/15   Melvenia Beam, MD  zolpidem (AMBIEN) 10 MG tablet Take 10 mg by mouth at bedtime as needed for sleep.  10/29/15   Historical Provider, MD   BP 123/80 mmHg  Pulse 74  Temp(Src) 97.8 F (36.6 C) (Oral)  Resp 16  SpO2 95% Physical Exam  Constitutional: She is oriented to person, place, and time. She appears well-developed and well-nourished. No distress.  Awake, alert, nontoxic appearance.  HENT:  Head: Normocephalic and atraumatic.  Eyes: EOM are normal. Right eye exhibits no discharge. Left eye exhibits no discharge.  Neck: Normal range of motion. Neck supple.  Cardiovascular: Normal rate, regular rhythm and normal heart sounds.   Pulmonary/Chest: Effort normal and breath sounds normal. She exhibits no tenderness.  Abdominal: Soft. She exhibits no distension. There is no tenderness. There is no rebound.  Musculoskeletal: Normal range of motion. She exhibits tenderness. She exhibits no edema.  Tenderness to dorsum of left thumb, full passive ROM active ROM decreased secondary to pain Distal pulses intact Brisk cap refill No elbow pain and full active range of motion.  Neurological: She is alert and oriented to person, place, and time.  Motor strength subtly decreased secondary to pain. Sensation is intact to light touch.  Skin: Skin is warm and dry. No rash noted.  Psychiatric: She has a normal mood and affect. Judgment normal.  Nursing note and vitals reviewed.   ED Course  Procedures  DIAGNOSTIC STUDIES: Oxygen Saturation is 95% on RA, adequate by my interpretation.  COORDINATION OF CARE:  5:52 PM Discussed treatment plan with pt at bedside and pt agreed to plan.  Labs Review Labs Reviewed - No data to display  Imaging Review Dg Ankle Complete Right  03/31/2016  CLINICAL DATA:  Pain after fall EXAM: RIGHT ANKLE - COMPLETE  3+ VIEW COMPARISON:  None. FINDINGS: There is no evidence of fracture, dislocation, or joint effusion. There is no evidence of arthropathy or other focal bone abnormality. Soft tissues are unremarkable. IMPRESSION: Negative. Electronically Signed   By: Dorise Bullion III M.D   On: 03/31/2016 16:17   Dg Hand Complete Left  03/31/2016  CLINICAL DATA:  Fall off riding lawnmower 2 days ago. Left hand injury and pain. Initial encounter. EXAM: LEFT HAND - COMPLETE 3+ VIEW COMPARISON:  None. FINDINGS: There is no evidence of fracture or dislocation. There is no evidence of arthropathy or other focal bone  abnormality. Soft tissues are unremarkable. IMPRESSION: Negative. Electronically Signed   By: Earle Gell M.D.   On: 03/31/2016 16:22   I have personally reviewed and evaluated these images and lab results as part of my medical decision-making.   EKG Interpretation None     SPLINT APPLICATION Date/Time: 123456 PM Authorized by: Verl Dicker Consent: Verbal consent obtained. Risks and benefits: risks, benefits and alternatives were discussed Consent given by: patient Splint applied by: orthopedic technician Location details: Left wrist  Splint type: Thumb spica  Supplies used: Splint  Post-procedure: The splinted body part was neurovascularly unchanged following the procedure. Patient tolerance: Patient tolerated the procedure well with no immediate complications.    MDM  Patient with left thumb/wrist pain secondary to mechanical fall. She does have some snuffbox tenderness, remains neurovascularly intact. Plain films of the hand are negative. However, given mechanism and snuffbox tenderness, we will treat for occult fracture. Placed in thumb spica splint. Patient reports she will follow-up with [facility] for reevaluation next week. Discussed strict return precautions. Final diagnoses:  Left wrist pain  Thumb pain, left    I personally performed the services described in  this documentation, which was scribed in my presence. The recorded information has been reviewed and is accurate.   Comer Locket, PA-C 03/31/16 1906  Comer Locket, PA-C 03/31/16 1907  Varney Biles, MD 04/01/16 0000

## 2016-04-01 ENCOUNTER — Encounter (HOSPITAL_BASED_OUTPATIENT_CLINIC_OR_DEPARTMENT_OTHER): Payer: Self-pay | Admitting: *Deleted

## 2016-04-01 NOTE — H&P (Signed)
  Patient name Tammy [name], Vicencio DICTATION# [id number] CSN# [id number]  Darlyn Chamber, MD 04/01/2016 11:10 AM

## 2016-04-01 NOTE — H&P (Signed)
Tammy [name], BUSAM NO.:  [id number]  MEDICAL RECORD NO.:  [id number]  LOCATION:  WA27                         FACILITY:  [facility]  PHYSICIAN:  Darlyn Chamber, M.D.   DATE OF BIRTH:  [birth year]  DATE OF ADMISSION:  03/31/2016 DATE OF DISCHARGE:  03/31/2016                             HISTORY & PHYSICAL   The date of her surgery is July 10 at [place] outpatient area at [place]:  The patient is a 55 year old, gravida 3, para 13 female presents for laparoscopic-assisted vaginal hysterectomy, the anterior and posterior repair along with a removal of both tubes and ovaries and cystoscopy with possible sacrospinous ligament suspension.  Patient presented to our office in January 30 of this year.  At that point in time, she had problems with recurrent pelvic relaxation with a combination of what sounds like stress incontinence as well as overactive bladder.  She had undergone a previous anterior and posterior repair with the mid urethral sling.  We did evaluate, examined her, and also did an ultrasound.  She did have a prominent uterine descensus with associated cystocele and rectocele.  We discussed to get urological consultation in view of her bladder symptoms.  She decided to proceed with surgery and not being concerned with the urological issues.  She now presents for the above-noted surgery.  ALLERGIES:  Allergic to sulfa and latex.  MEDICATIONS:  She is on Xanax.  Levothyroxine.  Zoloft and uses Ambien as needed for sleep.  PAST MEDICAL HISTORY:  Significant in that she has had three vaginal deliveries.  She has had a previous tubal ligation and gastric bypass surgery.  SURGERY:  She has had her gallbladder removed.  She has had breast reduction.  She had her appendix removed.  Had previous cystocele and rectocele repaired and also a previous uterine ablation.  SOCIAL HISTORY:  No tobacco, only occasional alcohol  use.  FAMILY HISTORY:  Noncontributory.  REVIEW OF SYSTEMS:  Noncontributory.  PHYSICAL EXAMINATION:  VITAL SIGNS:  The patient is afebrile.  Stable vital signs. HEENT:  The patient is normocephalic.  Pupils are equal, round, and reactive to light and accommodation.  Extraocular movements were intact. Sclerae and conjunctivae are clear.  Oropharynx clear. NECK:  Not examined. BREASTS:  Not examined. LUNGS:  Clear. CARDIOVASCULAR SYSTEM:  Regular rate.  No murmurs or gallops.  No carotid or abdominal bruits. ABDOMEN:  Benign.  No mass, organomegaly, or tenderness. GU:  On pelvic exam, normal external genitalia.  Vaginal mucosa is clear.  She does have a moderate cystocele, rectocele and uterine descensus.  Cervix unremarkable.  Uterus normal size, shape, and contour.  Adnexa free of mass or tenderness.  IMPRESSION: 1. Worsening symptomatic pelvic relaxation. 2. Urological symptomatology suggestive of overactive bladder and     stress incontinence.  PLAN:  At the present time, we will proceed with the above-noted surgery.  The risks have been explained including the risk of infection. The risk of hemorrhage that could require transfusion with the risk of HIV or hepatitis.  Risk of injury to adjacent organs such as bladder, bowel or ureters that could require further exploratory surgery.  Risk of deep venous thrombosis and pulmonary embolus.  The possibility of recurrent pelvic relaxation have been discussed.  Alternatives such as pessaries have been explained.  We did explain that we are not going to proceed with any type of urological evaluation which she concurs with. Patient again expressed understanding of indications, risks and alternatives.     Darlyn Chamber, M.D.     JSM/MEDQ  D:  04/01/2016  T:  04/01/2016  Job:  [id number]

## 2016-04-01 NOTE — Progress Notes (Signed)
NPO AFTER MN.  ARRIVE AT 0600.  NEEDS T & S.  GETTING LAB WORK DONE Thursday 04-08-2016 (CBC, CMET).  WILL TAKE ZOLOFT AND SYNTHROID AM DOS W/ SIPS OF WATER.

## 2016-04-08 DIAGNOSIS — N816 Rectocele: Secondary | ICD-10-CM | POA: Diagnosis not present

## 2016-04-08 DIAGNOSIS — N72 Inflammatory disease of cervix uteri: Secondary | ICD-10-CM | POA: Diagnosis not present

## 2016-04-08 DIAGNOSIS — E039 Hypothyroidism, unspecified: Secondary | ICD-10-CM | POA: Diagnosis not present

## 2016-04-08 DIAGNOSIS — D259 Leiomyoma of uterus, unspecified: Secondary | ICD-10-CM | POA: Diagnosis not present

## 2016-04-08 DIAGNOSIS — N8110 Cystocele, unspecified: Secondary | ICD-10-CM | POA: Diagnosis not present

## 2016-04-08 DIAGNOSIS — F329 Major depressive disorder, single episode, unspecified: Secondary | ICD-10-CM | POA: Diagnosis not present

## 2016-04-08 DIAGNOSIS — Z6841 Body Mass Index (BMI) 40.0 and over, adult: Secondary | ICD-10-CM | POA: Diagnosis not present

## 2016-04-08 DIAGNOSIS — N8189 Other female genital prolapse: Secondary | ICD-10-CM | POA: Diagnosis present

## 2016-04-08 DIAGNOSIS — F419 Anxiety disorder, unspecified: Secondary | ICD-10-CM | POA: Diagnosis not present

## 2016-04-08 DIAGNOSIS — M1990 Unspecified osteoarthritis, unspecified site: Secondary | ICD-10-CM | POA: Diagnosis not present

## 2016-04-08 DIAGNOSIS — Z9884 Bariatric surgery status: Secondary | ICD-10-CM | POA: Diagnosis not present

## 2016-04-08 DIAGNOSIS — Z79899 Other long term (current) drug therapy: Secondary | ICD-10-CM | POA: Diagnosis not present

## 2016-04-08 LAB — CBC
HCT: 36.5 % (ref 36.0–46.0)
Hemoglobin: 12 g/dL (ref 12.0–15.0)
MCH: 27.6 pg (ref 26.0–34.0)
MCHC: 32.9 g/dL (ref 30.0–36.0)
MCV: 83.9 fL (ref 78.0–100.0)
Platelets: 323 10*3/uL (ref 150–400)
RBC: 4.35 MIL/uL (ref 3.87–5.11)
RDW: 14.5 % (ref 11.5–15.5)
WBC: 7.1 10*3/uL (ref 4.0–10.5)

## 2016-04-08 LAB — COMPREHENSIVE METABOLIC PANEL
ALT: 20 U/L (ref 14–54)
AST: 25 U/L (ref 15–41)
Albumin: 4.2 g/dL (ref 3.5–5.0)
Alkaline Phosphatase: 90 U/L (ref 38–126)
Anion gap: 8 (ref 5–15)
BUN: 21 mg/dL — ABNORMAL HIGH (ref 6–20)
CO2: 24 mmol/L (ref 22–32)
Calcium: 8.9 mg/dL (ref 8.9–10.3)
Chloride: 104 mmol/L (ref 101–111)
Creatinine, Ser: 0.89 mg/dL (ref 0.44–1.00)
GFR calc Af Amer: >60 mL/min (ref >60)
GFR calc non Af Amer: >60 mL/min (ref >60)
Glucose, Bld: 93 mg/dL (ref 65–99)
Potassium: 4.4 mmol/L (ref 3.5–5.1)
Sodium: 136 mmol/L (ref 135–145)
Total Bilirubin: 1 mg/dL (ref 0.3–1.2)
Total Protein: 7.2 g/dL (ref 6.5–8.1)

## 2016-04-11 NOTE — Anesthesia Preprocedure Evaluation (Addendum)
Anesthesia Evaluation  Patient identified by MRN, date of birth, ID band Patient awake    Reviewed: Allergy & Precautions, NPO status , Patient's Chart, lab work & pertinent test results  Airway Mallampati: II  TM Distance: >3 FB Neck ROM: Full    Dental  (+) Dental Advisory Given   Pulmonary neg pulmonary ROS,    breath sounds clear to auscultation       Cardiovascular negative cardio ROS   Rhythm:Regular Rate:Normal     Neuro/Psych Seizures -,  Anxiety Depression    GI/Hepatic Neg liver ROS, GERD  ,  Endo/Other  Hypothyroidism Morbid obesity  Renal/GU negative Renal ROS     Musculoskeletal  (+) Arthritis ,   Abdominal   Peds  Hematology negative hematology ROS (+)   Anesthesia Other Findings   Reproductive/Obstetrics                            Lab Results  Component Value Date   WBC 7.1 04/08/2016   HGB 12.0 04/08/2016   HCT 36.5 04/08/2016   MCV 83.9 04/08/2016   PLT 323 04/08/2016   Lab Results  Component Value Date   CREATININE 0.89 04/08/2016   BUN 21* 04/08/2016   NA 136 04/08/2016   K 4.4 04/08/2016   CL 104 04/08/2016   CO2 24 04/08/2016    Anesthesia Physical Anesthesia Plan  ASA: III  Anesthesia Plan: General   Post-op Pain Management:    Induction: Intravenous  Airway Management Planned: Oral ETT  Additional Equipment:   Intra-op Plan:   Post-operative Plan: Extubation in OR  Informed Consent: I have reviewed the patients History and Physical, chart, labs and discussed the procedure including the risks, benefits and alternatives for the proposed anesthesia with the patient or authorized representative who has indicated his/her understanding and acceptance.   Dental advisory given  Plan Discussed with: CRNA  Anesthesia Plan Comments:        Anesthesia Quick Evaluation

## 2016-04-12 ENCOUNTER — Encounter (HOSPITAL_COMMUNITY)
Admission: RE | Disposition: A | Discharge status: Home / Self Care | Payer: Self-pay | Source: Ambulatory Visit | Attending: Obstetrics and Gynecology

## 2016-04-12 ENCOUNTER — Ambulatory Visit (HOSPITAL_BASED_OUTPATIENT_CLINIC_OR_DEPARTMENT_OTHER): Payer: 59 | Admitting: Anesthesiology

## 2016-04-12 ENCOUNTER — Observation Stay (HOSPITAL_BASED_OUTPATIENT_CLINIC_OR_DEPARTMENT_OTHER)
Admission: RE | Admit: 2016-04-12 | Discharge: 2016-04-13 | Disposition: A | Discharge status: Home / Self Care | Payer: 59 | Source: Ambulatory Visit | Attending: Obstetrics and Gynecology | Admitting: Obstetrics and Gynecology

## 2016-04-12 ENCOUNTER — Encounter (HOSPITAL_BASED_OUTPATIENT_CLINIC_OR_DEPARTMENT_OTHER): Payer: Self-pay | Admitting: *Deleted

## 2016-04-12 DIAGNOSIS — D259 Leiomyoma of uterus, unspecified: Secondary | ICD-10-CM | POA: Insufficient documentation

## 2016-04-12 DIAGNOSIS — N816 Rectocele: Secondary | ICD-10-CM | POA: Insufficient documentation

## 2016-04-12 DIAGNOSIS — N8110 Cystocele, unspecified: Secondary | ICD-10-CM | POA: Insufficient documentation

## 2016-04-12 DIAGNOSIS — Z9884 Bariatric surgery status: Secondary | ICD-10-CM | POA: Insufficient documentation

## 2016-04-12 DIAGNOSIS — Z79899 Other long term (current) drug therapy: Secondary | ICD-10-CM | POA: Insufficient documentation

## 2016-04-12 DIAGNOSIS — E039 Hypothyroidism, unspecified: Secondary | ICD-10-CM | POA: Insufficient documentation

## 2016-04-12 DIAGNOSIS — F329 Major depressive disorder, single episode, unspecified: Secondary | ICD-10-CM | POA: Insufficient documentation

## 2016-04-12 DIAGNOSIS — N72 Inflammatory disease of cervix uteri: Secondary | ICD-10-CM | POA: Insufficient documentation

## 2016-04-12 DIAGNOSIS — F419 Anxiety disorder, unspecified: Secondary | ICD-10-CM | POA: Insufficient documentation

## 2016-04-12 DIAGNOSIS — Z90710 Acquired absence of both cervix and uterus: Secondary | ICD-10-CM | POA: Diagnosis present

## 2016-04-12 DIAGNOSIS — Z6841 Body Mass Index (BMI) 40.0 and over, adult: Secondary | ICD-10-CM | POA: Insufficient documentation

## 2016-04-12 DIAGNOSIS — N8189 Other female genital prolapse: Principal | ICD-10-CM | POA: Insufficient documentation

## 2016-04-12 DIAGNOSIS — M1990 Unspecified osteoarthritis, unspecified site: Secondary | ICD-10-CM | POA: Insufficient documentation

## 2016-04-12 HISTORY — PX: ANTERIOR AND POSTERIOR REPAIR WITH SACROSPINOUS FIXATION: SHX6536

## 2016-04-12 HISTORY — DX: Unspecified injury of left wrist, hand and finger(s), initial encounter: S69.92XA

## 2016-04-12 HISTORY — DX: Personal history of other specified conditions: Z87.898

## 2016-04-12 HISTORY — DX: Other female genital prolapse: N81.89

## 2016-04-12 HISTORY — DX: Stress incontinence (female) (male): N39.3

## 2016-04-12 HISTORY — PX: LAPAROSCOPIC VAGINAL HYSTERECTOMY WITH SALPINGO OOPHORECTOMY: SHX6681

## 2016-04-12 HISTORY — DX: Frequency of micturition: R35.0

## 2016-04-12 HISTORY — DX: Hypothyroidism, unspecified: E03.9

## 2016-04-12 HISTORY — DX: Presence of spectacles and contact lenses: Z97.3

## 2016-04-12 LAB — TYPE AND SCREEN
ABO/RH(D): A NEG
Antibody Screen: NEGATIVE

## 2016-04-12 LAB — ABO/RH: ABO/RH(D): A NEG

## 2016-04-12 SURGERY — HYSTERECTOMY, VAGINAL, LAPAROSCOPY-ASSISTED, WITH SALPINGO-OOPHORECTOMY
Anesthesia: General | Site: Vagina

## 2016-04-12 MED ORDER — DEXAMETHASONE SODIUM PHOSPHATE 4 MG/ML IJ SOLN
INTRAMUSCULAR | Status: DC | PRN
  Administered 2016-04-12: 10 mg via INTRAVENOUS

## 2016-04-12 MED ORDER — LACTATED RINGERS IV SOLN
INTRAVENOUS | Status: DC
  Administered 2016-04-12 (×2): 1000 mL via INTRAVENOUS
  Administered 2016-04-13: 09:00:00 via INTRAVENOUS

## 2016-04-12 MED ORDER — KETOROLAC TROMETHAMINE 30 MG/ML IJ SOLN
30.0000 mg | Freq: Once | INTRAMUSCULAR | Status: DC | PRN
  Filled 2016-04-12: qty 1

## 2016-04-12 MED ORDER — MIDAZOLAM HCL 2 MG/2ML IJ SOLN
INTRAMUSCULAR | Status: AC
  Filled 2016-04-12: qty 2

## 2016-04-12 MED ORDER — FENTANYL CITRATE (PF) 100 MCG/2ML IJ SOLN
INTRAMUSCULAR | Status: AC
  Filled 2016-04-12: qty 2

## 2016-04-12 MED ORDER — BUPIVACAINE HCL (PF) 0.25 % IJ SOLN
INTRAMUSCULAR | Status: DC | PRN
  Administered 2016-04-12: 10 mL

## 2016-04-12 MED ORDER — LIDOCAINE-EPINEPHRINE (PF) 1 %-1:200000 IJ SOLN
INTRAMUSCULAR | Status: DC | PRN
  Administered 2016-04-12: 10 mL

## 2016-04-12 MED ORDER — ONDANSETRON HCL 4 MG/2ML IJ SOLN
4.0000 mg | Freq: Four times a day (QID) | INTRAMUSCULAR | Status: DC | PRN
  Administered 2016-04-13: 4 mg via INTRAVENOUS

## 2016-04-12 MED ORDER — PROMETHAZINE HCL 25 MG/ML IJ SOLN
6.2500 mg | INTRAMUSCULAR | Status: DC | PRN
  Filled 2016-04-12: qty 1

## 2016-04-12 MED ORDER — SERTRALINE HCL 50 MG PO TABS
50.0000 mg | ORAL_TABLET | Freq: Every morning | ORAL | Status: DC
  Administered 2016-04-13: 50 mg via ORAL
  Filled 2016-04-12: qty 1

## 2016-04-12 MED ORDER — ONDANSETRON HCL 4 MG/2ML IJ SOLN
INTRAMUSCULAR | Status: AC
  Filled 2016-04-12: qty 2

## 2016-04-12 MED ORDER — OXYCODONE HCL 5 MG PO TABS
5.0000 mg | ORAL_TABLET | Freq: Once | ORAL | Status: DC | PRN
  Filled 2016-04-12: qty 1

## 2016-04-12 MED ORDER — MENTHOL 3 MG MT LOZG: 1.0000 | LOZENGE | OROMUCOSAL | Status: DC | PRN

## 2016-04-12 MED ORDER — SODIUM CHLORIDE 0.9 % IR SOLN
Status: DC | PRN
  Administered 2016-04-12: 500 mL

## 2016-04-12 MED ORDER — LACTATED RINGERS IR SOLN
Status: DC | PRN
  Administered 2016-04-12: 3000 mL

## 2016-04-12 MED ORDER — PROPOFOL 500 MG/50ML IV EMUL
INTRAVENOUS | Status: AC
  Filled 2016-04-12: qty 50

## 2016-04-12 MED ORDER — CEFAZOLIN SODIUM-DEXTROSE 2-4 GM/100ML-% IV SOLN
INTRAVENOUS | Status: AC
  Filled 2016-04-12: qty 100

## 2016-04-12 MED ORDER — SODIUM CHLORIDE 0.9% FLUSH: 9.0000 mL | INTRAVENOUS | Status: DC | PRN

## 2016-04-12 MED ORDER — CEFAZOLIN SODIUM-DEXTROSE 2-4 GM/100ML-% IV SOLN
2.0000 g | INTRAVENOUS | Status: AC
  Administered 2016-04-12: 2 g via INTRAVENOUS
  Filled 2016-04-12: qty 100

## 2016-04-12 MED ORDER — EPHEDRINE SULFATE 50 MG/ML IJ SOLN
INTRAMUSCULAR | Status: DC | PRN
  Administered 2016-04-12: 10 mg via INTRAVENOUS
  Administered 2016-04-12: 20 mg via INTRAVENOUS

## 2016-04-12 MED ORDER — NALOXONE HCL 0.4 MG/ML IJ SOLN: 0.4000 mg | INTRAMUSCULAR | Status: DC | PRN

## 2016-04-12 MED ORDER — HYDROMORPHONE HCL 1 MG/ML IJ SOLN
INTRAMUSCULAR | Status: AC
  Filled 2016-04-12: qty 1

## 2016-04-12 MED ORDER — ROCURONIUM BROMIDE 100 MG/10ML IV SOLN
INTRAVENOUS | Status: DC | PRN
Start: 2016-04-12 — End: 2016-04-12
  Administered 2016-04-12: 10 mg via INTRAVENOUS
  Administered 2016-04-12: 50 mg via INTRAVENOUS
  Administered 2016-04-12: 10 mg via INTRAVENOUS
  Administered 2016-04-12: 20 mg via INTRAVENOUS
  Administered 2016-04-12: 10 mg via INTRAVENOUS

## 2016-04-12 MED ORDER — ACETAMINOPHEN 325 MG PO TABS
650.0000 mg | ORAL_TABLET | ORAL | Status: DC | PRN
  Administered 2016-04-13: 650 mg via ORAL
  Filled 2016-04-12: qty 2

## 2016-04-12 MED ORDER — HYDROMORPHONE HCL 1 MG/ML IJ SOLN
0.2500 mg | INTRAMUSCULAR | Status: DC | PRN
  Administered 2016-04-12 (×4): 0.5 mg via INTRAVENOUS
  Filled 2016-04-12: qty 1

## 2016-04-12 MED ORDER — ONDANSETRON HCL 4 MG/2ML IJ SOLN
4.0000 mg | Freq: Four times a day (QID) | INTRAMUSCULAR | Status: DC | PRN
  Filled 2016-04-12: qty 2

## 2016-04-12 MED ORDER — LEVOTHYROXINE SODIUM 50 MCG PO TABS
75.0000 ug | ORAL_TABLET | Freq: Every day | ORAL | Status: DC
  Administered 2016-04-13: 75 ug via ORAL
  Filled 2016-04-12: qty 1

## 2016-04-12 MED ORDER — ONDANSETRON HCL 4 MG/2ML IJ SOLN
INTRAMUSCULAR | Status: DC | PRN
  Administered 2016-04-12: 4 mg via INTRAVENOUS

## 2016-04-12 MED ORDER — MIDAZOLAM HCL 5 MG/5ML IJ SOLN
INTRAMUSCULAR | Status: DC | PRN
Start: 2016-04-12 — End: 2016-04-12
  Administered 2016-04-12 (×2): 2 mg via INTRAVENOUS

## 2016-04-12 MED ORDER — LACTATED RINGERS IV SOLN
INTRAVENOUS | Status: DC
  Administered 2016-04-12 (×2): via INTRAVENOUS
  Filled 2016-04-12: qty 1000

## 2016-04-12 MED ORDER — OXYCODONE-ACETAMINOPHEN 5-325 MG PO TABS
1.0000 | ORAL_TABLET | ORAL | Status: DC | PRN
  Administered 2016-04-12 – 2016-04-13 (×3): 2 via ORAL
  Filled 2016-04-12 (×3): qty 2

## 2016-04-12 MED ORDER — ONDANSETRON HCL 4 MG PO TABS: 4.0000 mg | ORAL_TABLET | Freq: Four times a day (QID) | ORAL | Status: DC | PRN

## 2016-04-12 MED ORDER — ARTIFICIAL TEARS OP OINT
TOPICAL_OINTMENT | OPHTHALMIC | Status: AC
  Filled 2016-04-12: qty 3.5

## 2016-04-12 MED ORDER — DIPHENHYDRAMINE HCL 50 MG/ML IJ SOLN: 12.5000 mg | Freq: Four times a day (QID) | INTRAMUSCULAR | Status: DC | PRN

## 2016-04-12 MED ORDER — PROPOFOL 10 MG/ML IV BOLUS
INTRAVENOUS | Status: DC | PRN
Start: 2016-04-12 — End: 2016-04-12
  Administered 2016-04-12: 30 mg via INTRAVENOUS
  Administered 2016-04-12: 250 mg via INTRAVENOUS
  Administered 2016-04-12: 20 mg via INTRAVENOUS

## 2016-04-12 MED ORDER — SUGAMMADEX SODIUM 500 MG/5ML IV SOLN
INTRAVENOUS | Status: DC | PRN
  Administered 2016-04-12: 200 mg via INTRAVENOUS

## 2016-04-12 MED ORDER — LIDOCAINE HCL (CARDIAC) 20 MG/ML IV SOLN
INTRAVENOUS | Status: DC | PRN
  Administered 2016-04-12: 60 mg via INTRAVENOUS

## 2016-04-12 MED ORDER — DIPHENHYDRAMINE HCL 12.5 MG/5ML PO ELIX: 12.5000 mg | ORAL_SOLUTION | Freq: Four times a day (QID) | ORAL | Status: DC | PRN

## 2016-04-12 MED ORDER — KETOROLAC TROMETHAMINE 30 MG/ML IJ SOLN
INTRAMUSCULAR | Status: DC | PRN
Start: 2016-04-12 — End: 2016-04-12
  Administered 2016-04-12: 30 mg via INTRAVENOUS

## 2016-04-12 MED ORDER — STERILE WATER FOR IRRIGATION IR SOLN
Status: DC | PRN
  Administered 2016-04-12: 500 mL

## 2016-04-12 MED ORDER — FENTANYL CITRATE (PF) 100 MCG/2ML IJ SOLN
INTRAMUSCULAR | Status: DC | PRN
  Administered 2016-04-12 (×4): 50 ug via INTRAVENOUS
  Administered 2016-04-12: 100 ug via INTRAVENOUS
  Administered 2016-04-12 (×3): 50 ug via INTRAVENOUS
  Administered 2016-04-12: 100 ug via INTRAVENOUS

## 2016-04-12 MED ORDER — DEXAMETHASONE SODIUM PHOSPHATE 10 MG/ML IJ SOLN
INTRAMUSCULAR | Status: AC
  Filled 2016-04-12: qty 1

## 2016-04-12 MED ORDER — ALPRAZOLAM 0.25 MG PO TABS
0.2500 mg | ORAL_TABLET | Freq: Three times a day (TID) | ORAL | Status: DC | PRN
  Administered 2016-04-12 – 2016-04-13 (×2): 0.25 mg via ORAL
  Filled 2016-04-12 (×2): qty 1

## 2016-04-12 MED ORDER — OXYCODONE HCL 5 MG/5ML PO SOLN
5.0000 mg | Freq: Once | ORAL | Status: DC | PRN
  Filled 2016-04-12: qty 5

## 2016-04-12 MED ORDER — HYDROMORPHONE 1 MG/ML IV SOLN
INTRAVENOUS | Status: DC
  Administered 2016-04-12: 25 mg via INTRAVENOUS
  Filled 2016-04-12: qty 25

## 2016-04-12 MED ORDER — LIDOCAINE HCL (CARDIAC) 20 MG/ML IV SOLN
INTRAVENOUS | Status: AC
  Filled 2016-04-12: qty 5

## 2016-04-12 MED ORDER — FLUORESCEIN SODIUM 10 % IV SOLN
INTRAVENOUS | Status: DC | PRN
  Administered 2016-04-12: 300 mg via INTRAVENOUS

## 2016-04-12 MED ORDER — ROCURONIUM BROMIDE 100 MG/10ML IV SOLN
INTRAVENOUS | Status: AC
  Filled 2016-04-12: qty 1

## 2016-04-12 MED ORDER — HYDROMORPHONE 1 MG/ML IV SOLN
INTRAVENOUS | Status: DC
  Administered 2016-04-12: 6.03 mg via INTRAVENOUS
  Administered 2016-04-12: 4.43 mg via INTRAVENOUS

## 2016-04-12 SURGICAL SUPPLY — 102 items
APPLICATOR COTTON TIP 6IN STRL (MISCELLANEOUS) ×3 IMPLANT
BAG SPEC RTRVL LRG 6X4 10 (ENDOMECHANICALS)
BAG URINE DRAINAGE (UROLOGICAL SUPPLIES) ×3 IMPLANT
BANDAGE ADH SHEER 1  50/CT (GAUZE/BANDAGES/DRESSINGS) ×3 IMPLANT
BANDAGE ADHESIVE 1X3 (GAUZE/BANDAGES/DRESSINGS) IMPLANT
BLADE SURG 10 STRL SS (BLADE) ×3 IMPLANT
BLADE SURG 11 STRL SS (BLADE) ×3 IMPLANT
BLADE SURG 15 STRL LF DISP TIS (BLADE) ×2 IMPLANT
BLADE SURG 15 STRL SS (BLADE) ×3
BNDG GAUZE ELAST 4 BULKY (GAUZE/BANDAGES/DRESSINGS) ×3 IMPLANT
CANISTER SUCTION 2500CC (MISCELLANEOUS) ×6 IMPLANT
CATH FOLEY 2WAY SLVR  5CC 14FR (CATHETERS) ×1
CATH FOLEY 2WAY SLVR 5CC 14FR (CATHETERS) ×2 IMPLANT
COVER BACK TABLE 60X90IN (DRAPES) ×6 IMPLANT
COVER MAYO STAND STRL (DRAPES) ×6 IMPLANT
DECANTER SPIKE VIAL GLASS SM (MISCELLANEOUS) IMPLANT
DEVICE CAPIO SLIM SINGLE (INSTRUMENTS) ×3 IMPLANT
DEVICE CAPIO SUTURING (INSTRUMENTS)
DEVICE CAPIO SUTURING OPC (INSTRUMENTS) IMPLANT
DRAPE LG THREE QUARTER DISP (DRAPES) ×6 IMPLANT
DRAPE UNDERBUTTOCKS STRL (DRAPE) ×3 IMPLANT
DRSG TEGADERM 2-3/8X2-3/4 SM (GAUZE/BANDAGES/DRESSINGS) ×3 IMPLANT
ELECT REM PT RETURN 9FT ADLT (ELECTROSURGICAL) ×3
ELECTRODE REM PT RTRN 9FT ADLT (ELECTROSURGICAL) ×2 IMPLANT
FILTER SMOKE EVAC LAPAROSHD (FILTER) IMPLANT
GAUZE SPONGE 4X4 12PLY STRL (GAUZE/BANDAGES/DRESSINGS) ×3 IMPLANT
GLOVE BIO SURGEON STRL SZ7 (GLOVE) ×12 IMPLANT
GLOVE BIOGEL PI IND STRL 6.5 (GLOVE) ×6 IMPLANT
GLOVE BIOGEL PI INDICATOR 6.5 (GLOVE) ×3
GLOVE SURG SS PI 6.5 STRL IVOR (GLOVE) ×6 IMPLANT
GOWN STRL REUS W/ TWL LRG LVL3 (GOWN DISPOSABLE) ×4 IMPLANT
GOWN STRL REUS W/ TWL XL LVL3 (GOWN DISPOSABLE) ×4 IMPLANT
GOWN STRL REUS W/TWL LRG LVL3 (GOWN DISPOSABLE) ×6
GOWN STRL REUS W/TWL XL LVL3 (GOWN DISPOSABLE) ×4
HOLDER FOLEY CATH W/STRAP (MISCELLANEOUS) ×3 IMPLANT
KIT ROOM TURNOVER WOR (KITS) ×3 IMPLANT
LEGGING LITHOTOMY PAIR STRL (DRAPES) ×3 IMPLANT
LIQUID BAND (GAUZE/BANDAGES/DRESSINGS) ×3 IMPLANT
NEEDLE HYPO 22GX1.5 SAFETY (NEEDLE) ×3 IMPLANT
NEEDLE INSUFFLATION 14GA 120MM (NEEDLE) ×3 IMPLANT
NEEDLE INSUFFLATION 14GA 150MM (NEEDLE) IMPLANT
NEEDLE MAYO 6 CRC TAPER PT (NEEDLE) ×3 IMPLANT
NEEDLE SPNL 22GX3.5 QUINCKE BK (NEEDLE) ×3 IMPLANT
NS IRRIG 500ML POUR BTL (IV SOLUTION) ×3 IMPLANT
PACK BASIN DAY SURGERY FS (CUSTOM PROCEDURE TRAY) ×3 IMPLANT
PAD OB MATERNITY 4.3X12.25 (PERSONAL CARE ITEMS) ×3 IMPLANT
PAD PREP 24X48 CUFFED NSTRL (MISCELLANEOUS) ×3 IMPLANT
PADDING ION DISPOSABLE (MISCELLANEOUS) ×3 IMPLANT
PENCIL BUTTON HOLSTER BLD 10FT (ELECTRODE) ×3 IMPLANT
POUCH SPECIMEN RETRIEVAL 10MM (ENDOMECHANICALS) IMPLANT
SCISSORS LAP 5X35 DISP (ENDOMECHANICALS) IMPLANT
SCISSORS LAP 5X45 EPIX DISP (ENDOMECHANICALS) IMPLANT
SEALER TISSUE G2 CVD JAW 45CM (ENDOMECHANICALS) ×3 IMPLANT
SET IRRIG TUBING LAPAROSCOPIC (IRRIGATION / IRRIGATOR) ×3 IMPLANT
SET IRRIG Y TYPE TUR BLADDER L (SET/KITS/TRAYS/PACK) ×3 IMPLANT
SHEET LAVH (DRAPES) ×3 IMPLANT
SILASTIC FOLEY ×3 IMPLANT
SOLUTION ANTI FOG 6CC (MISCELLANEOUS) ×3 IMPLANT
SOLUTION ELECTROLUBE (MISCELLANEOUS) IMPLANT
SPONGE GAUZE 2X2 8PLY STRL LF (GAUZE/BANDAGES/DRESSINGS) IMPLANT
SPONGE LAP 4X18 X RAY DECT (DISPOSABLE) ×3 IMPLANT
STRIP CLOSURE SKIN 1/4X4 (GAUZE/BANDAGES/DRESSINGS) IMPLANT
SUT CAPIO POLYGLYCOLIC (SUTURE) ×3 IMPLANT
SUT CHROMIC 2 0 SH (SUTURE) ×3 IMPLANT
SUT CHROMIC 3 0 SH 27 (SUTURE) ×3 IMPLANT
SUT GUT CHROMIC 3 0 (SUTURE) IMPLANT
SUT MNCRL AB 4-0 PS2 18 (SUTURE) ×3 IMPLANT
SUT MON AB 2-0 CT1 36 (SUTURE) ×6 IMPLANT
SUT MON AB 3-0 SH 27 (SUTURE)
SUT MON AB 3-0 SH27 (SUTURE) IMPLANT
SUT VIC AB 0 CT1 18XCR BRD8 (SUTURE) ×4 IMPLANT
SUT VIC AB 0 CT1 36 (SUTURE) ×3 IMPLANT
SUT VIC AB 0 CT1 8-18 (SUTURE) ×6
SUT VIC AB 2-0 CT1 (SUTURE) ×9 IMPLANT
SUT VIC AB 2-0 CT1 27 (SUTURE)
SUT VIC AB 2-0 CT1 TAPERPNT 27 (SUTURE) IMPLANT
SUT VIC AB 2-0 CT2 27 (SUTURE) IMPLANT
SUT VIC AB 2-0 SH 18 (SUTURE) IMPLANT
SUT VIC AB 2-0 SH 27 (SUTURE)
SUT VIC AB 2-0 SH 27XBRD (SUTURE) IMPLANT
SUT VIC AB 2-0 UR6 27 (SUTURE) ×9 IMPLANT
SUT VIC AB 3-0 SH 27 (SUTURE)
SUT VIC AB 3-0 SH 27X BRD (SUTURE) IMPLANT
SUT VICRYL 0 UR6 27IN ABS (SUTURE) ×3 IMPLANT
SUT VICRYL 1 TIES 12X18 (SUTURE) ×3 IMPLANT
SUT VICRYL 4-0 PS2 18IN ABS (SUTURE) ×3 IMPLANT
SYR BULB IRRIGATION 50ML (SYRINGE) ×3 IMPLANT
SYR CONTROL 10ML LL (SYRINGE) ×3 IMPLANT
SYRINGE 10CC LL (SYRINGE) ×6 IMPLANT
TOWEL OR 17X24 6PK STRL BLUE (TOWEL DISPOSABLE) ×6 IMPLANT
TRAY DSU PREP LF (CUSTOM PROCEDURE TRAY) ×3 IMPLANT
TRAY FOLEY CATH SILVER 14FR (SET/KITS/TRAYS/PACK) ×3 IMPLANT
TROCAR BALLN 12MMX100 BLUNT (TROCAR) ×3 IMPLANT
TROCAR BLADELESS OPT 12M 100M (ENDOMECHANICALS) ×3 IMPLANT
TROCAR OPTI TIP 5M 100M (ENDOMECHANICALS) ×3 IMPLANT
TROCAR XCEL 12X100 BLDLESS (ENDOMECHANICALS) IMPLANT
TROCAR XCEL DIL TIP R 11M (ENDOMECHANICALS) IMPLANT
TUBE CONNECTING 12X1/4 (SUCTIONS) ×6 IMPLANT
TUBING INSUFFLATION 10FT LAP (TUBING) ×3 IMPLANT
WARMER LAPAROSCOPE (MISCELLANEOUS) ×3 IMPLANT
WATER STERILE IRR 500ML POUR (IV SOLUTION) ×3 IMPLANT
YANKAUER SUCT BULB TIP NO VENT (SUCTIONS) ×3 IMPLANT

## 2016-04-12 NOTE — Brief Op Note (Signed)
04/12/2016  10:25 AM  PATIENT:  Tammy [name]  55 y.o. female  PRE-OPERATIVE DIAGNOSIS:  pelvic relaxation  POST-OPERATIVE DIAGNOSIS:  pelvic relaxation  PROCEDURE:  Procedure(s): LAPAROSCOPIC ASSISTED VAGINAL HYSTERECTOMY WITH SALPINGO OOPHORECTOMY (Bilateral) ANTERIOR AND POSTERIOR REPAIR WITH SACROSPINOUS LIGAMENT SUSPENSION, CYSTOSCOPY (N/A)  SURGEON:  Surgeon(s) and Role:    * Arvella Nigh, MD - Primary    * Linda Hedges, DO - Assisting  PHYSICIAN ASSISTANT:   ASSISTANTS: morris    ANESTHESIA:   local and general  EBL:  Total I/O In: 1700 [I.V.:1700] Out: 900 [Urine:400; Blood:500]  BLOOD ADMINISTERED:none  DRAINS: Urinary Catheter (Foley)   LOCAL MEDICATIONS USED:  XYLOCAINE   SPECIMEN: uterus tubes and ovaries  DISPOSITION OF SPECIMEN:  PATHOLOGY  COUNTS:  YES  TOURNIQUET:  * No tourniquets in log *  DICTATION: .Other Dictation: Dictation Number [id number]  PLAN OF CARE: Admit for overnight observation  PATIENT DISPOSITION:  PACU - hemodynamically stable.   Delay start of Pharmacological VTE agent (>24hrs) due to surgical blood loss or risk of bleeding: no

## 2016-04-12 NOTE — H&P (Signed)
  History and physical exam unchanged 

## 2016-04-12 NOTE — Transfer of Care (Signed)
Immediate Anesthesia Transfer of Care Note  Patient: Tammy [name]  Procedure(s) Performed: Procedure(s): LAPAROSCOPIC ASSISTED VAGINAL HYSTERECTOMY WITH SALPINGO OOPHORECTOMY (Bilateral) ANTERIOR AND POSTERIOR REPAIR WITH SACROSPINOUS LIGAMENT SUSPENSION, CYSTOSCOPY (N/A)  Patient Location: PACU  Anesthesia Type:General  Level of Consciousness: awake, alert , oriented and patient cooperative  Airway & Oxygen Therapy: Patient Spontanous Breathing and Patient connected to nasal cannula oxygen  Post-op Assessment: Report given to RN and Post -op Vital signs reviewed and stable  Post vital signs: Reviewed and stable  Last Vitals:  Filed Vitals:   04/12/16 1330 04/12/16 1430  BP: 110/62 100/65  Pulse: 80 81  Temp: 36.8 C 36.9 C  Resp: 18 18    Last Pain:  Filed Vitals:   04/12/16 1507  PainSc: 10-Worst pain ever      Patients Stated Pain Goal: 5 ([id number]  Complications: No apparent anesthesia complications

## 2016-04-12 NOTE — Progress Notes (Signed)
Patient ID: Tammy [name], female   DOB: [birth year], 55 y.o.   MRN: [id number] AF VSS ABD SOFT INC CLEAR MINIMAL BLEEDING

## 2016-04-12 NOTE — Progress Notes (Signed)
Pt ambulated from 1339 to 1331,tolerated well. Pads changed x3 bright red blood. Tolerated clear liquids, advanced to full liquids for dinner. Husband at bedside. [place] for lower back ache.

## 2016-04-12 NOTE — Anesthesia Procedure Notes (Addendum)
Procedure Name: Intubation Date/Time: 04/12/2016 7:41 AM Performed by: Wanita Chamberlain Pre-anesthesia Checklist: Patient identified, Timeout performed, Emergency Drugs available, Suction available and Patient being monitored Patient Re-evaluated:Patient Re-evaluated prior to inductionOxygen Delivery Method: Circle system utilized Preoxygenation: Pre-oxygenation with 100% oxygen Intubation Type: IV induction Ventilation: Mask ventilation without difficulty and Oral airway inserted - appropriate to patient size Laryngoscope Size: Mac and 3 Grade View: Grade III Tube type: Oral Tube size: 7.0 mm Number of attempts: 1 Airway Equipment and Method: Stylet and Patient positioned with wedge pillow Placement Confirmation: ETT inserted through vocal cords under direct vision,  positive ETCO2 and breath sounds checked- equal and bilateral Secured at: 22 cm Tube secured with: Tape Dental Injury: Teeth and Oropharynx as per pre-operative assessment  Comments: Narrow palate

## 2016-04-12 NOTE — Anesthesia Postprocedure Evaluation (Signed)
Anesthesia Post Note  Patient: Tammy [name]  Procedure(s) Performed: Procedure(s) (LRB): LAPAROSCOPIC ASSISTED VAGINAL HYSTERECTOMY WITH SALPINGO OOPHORECTOMY (Bilateral) ANTERIOR AND POSTERIOR REPAIR WITH SACROSPINOUS LIGAMENT SUSPENSION, CYSTOSCOPY (N/A)  Patient location during evaluation: PACU Anesthesia Type: General Level of consciousness: awake and alert Pain management: pain level controlled Vital Signs Assessment: post-procedure vital signs reviewed and stable Respiratory status: spontaneous breathing, nonlabored ventilation, respiratory function stable and patient connected to nasal cannula oxygen Cardiovascular status: blood pressure returned to baseline and stable Postop Assessment: no signs of nausea or vomiting Anesthetic complications: no    Last Vitals:  Filed Vitals:   04/12/16 1200 04/12/16 1230  BP: 112/60 109/58  Pulse: 78 85  Temp: 36.8 C 36.8 C  Resp: 14 15    Last Pain:  Filed Vitals:   04/12/16 1244  PainSc: 10-Worst pain ever                 Tiajuana Amass

## 2016-04-13 DIAGNOSIS — N8189 Other female genital prolapse: Secondary | ICD-10-CM

## 2016-04-13 LAB — CBC
HCT: 27.7 % — ABNORMAL LOW (ref 36.0–46.0)
Hemoglobin: 8.6 g/dL — ABNORMAL LOW (ref 12.0–15.0)
MCH: 27.4 pg (ref 26.0–34.0)
MCHC: 31 g/dL (ref 30.0–36.0)
MCV: 88.2 fL (ref 78.0–100.0)
Platelets: 220 10*3/uL (ref 150–400)
RBC: 3.14 MIL/uL — ABNORMAL LOW (ref 3.87–5.11)
RDW: 14.9 % (ref 11.5–15.5)
WBC: 8.6 10*3/uL (ref 4.0–10.5)

## 2016-04-13 MED ORDER — OXYCODONE-ACETAMINOPHEN 7.5-325 MG PO TABS: 1.0000 | ORAL_TABLET | ORAL | Status: DC | PRN

## 2016-04-13 NOTE — Op Note (Signed)
Tammy [name], DELUCCA NO.:  [id number]  MEDICAL RECORD NO.:  [id number]  LOCATION:  [id number]                         FACILITY:  [facility]  PHYSICIAN:  Darlyn Chamber, M.D.   DATE OF BIRTH:  31-Dec-1960  DATE OF PROCEDURE:  04/12/2016 DATE OF DISCHARGE:                              OPERATIVE REPORT   PREOPERATIVE DIAGNOSIS:  Symptomatic pelvic relaxation.  POSTOPERATIVE DIAGNOSIS:  Symptomatic pelvic relaxation.  PROCEDURES:  Laparoscopic-assisted vaginal hysterectomy with bilateral salpingo-oophorectomy.  Posterior colporrhaphy.  Sacrospinous ligament suspension.  Cystoscopy.  SURGEON:  Darlyn Chamber, M.D.  ASSISTANTTrinda Pascal Morris.  ANESTHESIA:  General endotracheal.  ESTIMATED BLOOD LOSS:  300 mL.  PACKS:  Included vaginal pack.  DRAINS:  Urethral Foley.  INTRAOPERATIVE BLOOD PLACED:  None.  COMPLICATIONS:  None.  INDICATION:  Dictated in history and physical.  DESCRIPTION OF PROCEDURE:  The patient was taken to the OR and placed in supine position.  After satisfactory level of general endotracheal anesthesia was obtained, the patient was placed in the dorsal lithotomy position using the Allen stirrups.  The abdomen, perineum and vagina were prepped out with Betadine.  Bladder was emptied by in-and-out catheterization.  A Hulka tenaculum was put in place and secured.  The patient was then draped in a sterile field.  Subumbilical incision was made with the knife, extended through the subcutaneous tissue.  The anterior rectus fascia was identified and entered sharply.  The peritoneum was entered with blunt finger pressure.  An open laparoscopic trocar was put in place and secured.  Abdomen was inflated with carbon dioxide.  Laparoscope was then introduced.  There was no evidence of injury to adjacent organs.  She did have some omental adhesions to the upper anterior abdominal wall.  She had a previous gastric bypass. There were no signs of injury to  adjacent organs.  A 5-mm trocar was put in place under direct visualization.  Visualization revealed normal uterus, tubes and ovaries.  Each ureter was identified along its course of the pelvic sidewall.  First, the right ovary was elevated.  The ovarian vasculature was then cauterized and incised using the EnSeal. Peritoneal attachments of the ovary and tube were then cauterized and incised up to the round ligament.  The right round ligament was then cauterized and incised.  We then went to the left side.  The left ovary was elevated.  Left ovarian vasculature was cauterized and incised. Mesenteric attachments of ovary and tube were cauterized and incised up to the round ligament.  Then, the round ligament was cauterized and incised.  We had good hemostasis.  The decision now was to go vaginally.  The abdomen was deflated with carbon dioxide.  Laparoscope was then removed.  The patient's legs were repositioned.  The Hulka tenaculum was then removed.  A weighted speculum was placed in the vaginal vault.  The cervix was grasped with Ardis Hughs tenaculum.  Cul-de-sac was entered sharply.  Both uterosacral ligaments were clamped, cut and suture ligated with 0-Vicryl.  The reflection of the vaginal mucosa anteriorly was incised and bladder was dissected superiorly.  Paracervical tissue was then clamped, cut and suture ligated with 0-Vicryl.  The vesicouterine  space was identified and entered sharply and retractors were put in place using the clamp, cut and tie technique with suture ligatures of 0 Vicryl.  Parametrium was serially separated from the sides of the uterus.  The uterus was then flipped.  Remainder of the pedicles were clamped and cut, and secured with free ties of 0 Vicryl. Uterus, tubes and ovaries were passed off the operative field. Posterior vaginal cuff was run with running locking suture of 0 Vicryl. Vaginal mucosa was then reapproximated with interrupted figure-of-eights of  2-0 Vicryl.  We had good hemostasis and approximation.  She had really no significant cystocele.  Her main issue was a rectocele at this point in time, which we now approached.  The incision was made in the perineal body.  The skin was dissected the vaginal opening and excised. Vaginal mucosa was infiltrated with 1% Xylocaine with epinephrine. Using sharp and blunt dissection, the perirectal fascia was separated from the overlying vaginal mucosa.  We made an incision in the vaginal close in the midline up to the vaginal apex.  We dissected out the right side and identified the sacrospinous ligament.  The Capio was brought in place.  No suture of 0 Vicryl was secured into the uterosacral ligaments and then secured to the vaginal apex using a free Mayo needle, this was held.  Next, perirectal fascia was brought together in the midline with interrupted sutures of 2-0 Vicryl.  We had good reduction of the rectocele.  At this point in time, we began reapproximating the vaginal mucosa with interrupted sutures of 2-0 Vicryl.  Before we got to the vaginal opening, we tied down the sacrospinous ligament suspension with excellent elevation of the vaginal cuff and really complete reduction of the cystocele.  The remaining vaginal mucosa was closed with interrupted sutures of 2-0 Vicryl.  The perineal body was built up with interrupted sutures of 2-0 Vicryl.  Skin was closed with running subcuticular of 2-0 Vicryl.  We had good hemostasis.  Next, the cystoscope was introduced, intrauterine cavity was distended using a bit of saline, she was given fluorecin.  Both uterus were seen to spill the green-tinged urine from the fluorecin.  There was no evidence of any bladder injury whatsoever. At this point in time, the cystoscope was removed and Foley was placed to straight drain.  The patient's legs were repositioned.  Abdomen was reinflated with carbon dioxide.  Laparoscope was introduced.  Areas of  oozing, brought in control with the bipolar.  We irrigated the pelvis at this point in time.  We had good hemostasis.  Both ovarian vasculatures were hemostatically intact.  The vaginal cuff was hemostatically intact.  We thoroughly irrigated the pelvis and removed irrigation.  At this point in time, we deflated the abdomen and reinflated and no active bleeding was encountered.  The abdomen was deflated with carbon dioxide.  All trocars were removed.  Subumbilical fascia was closed with two figure-of- eights of 0 Vicryl.  Skin was closed with interrupted subcuticulars of 2- 0 Vicryl.  Suprapubic incision was closed with Dermabond.  Vaginal pack was put in place.  Urine output was green tinged, but clear.  The patient was taken out of the dorsal lithotomy position.  Once alert and extubated, transferred to the recovery room in good condition. Sponge, instrument and needle count were correct by circulating nurse multiple times.     Darlyn Chamber, M.D.     JSM/MEDQ  D:  04/12/2016  T:  04/13/2016  Job:  [id number] 

## 2016-04-13 NOTE — Progress Notes (Signed)
1 Day Post-Op Procedure(s) (LRB): LAPAROSCOPIC ASSISTED VAGINAL HYSTERECTOMY WITH SALPINGO OOPHORECTOMY (Bilateral) ANTERIOR AND POSTERIOR REPAIR WITH SACROSPINOUS LIGAMENT SUSPENSION, CYSTOSCOPY (N/A)  Subjective: Patient reports tolerating PO.    Objective: I have reviewed patient's vital signs and labs.  General: alert GI: soft, non-tender; bowel sounds normal; no masses,  no organomegaly and incision: clean Vaginal Bleeding: minimal  Assessment: s/p Procedure(s): LAPAROSCOPIC ASSISTED VAGINAL HYSTERECTOMY WITH SALPINGO OOPHORECTOMY (Bilateral) ANTERIOR AND POSTERIOR REPAIR WITH SACROSPINOUS LIGAMENT SUSPENSION, CYSTOSCOPY (N/A): stable  Plan: Discharge home     [facility] S 04/13/2016, 8:49 AM

## 2016-04-13 NOTE — Progress Notes (Signed)
Pt's vitals are wnl, tolerating diet and pain is under control. Dicussed discharge instructions with both patient and husband. Discharged to home with prescription.

## 2016-04-13 NOTE — Discharge Summary (Signed)
  Patient name  Tammy [name], Tammy [name] CSN# [id number]  [facility], MD 04/13/2016 8:51 AM

## 2016-04-14 NOTE — Discharge Summary (Signed)
Tammy [name], Tammy [name]                ACCOUNT NO.:  [id number]  MEDICAL RECORD NO.:  [id number]  LOCATION:  [id number]                         FACILITY:  [facility]  PHYSICIAN:  Darlyn Chamber, M.D.   DATE OF BIRTH:  1961/06/30  DATE OF ADMISSION:  04/12/2016 DATE OF DISCHARGE:  04/13/2016                              DISCHARGE SUMMARY   ADMITTING DIAGNOSIS:  Pelvic relaxation.  DISCHARGE DIAGNOSIS:  Pelvic relaxation.  PROCEDURES:  Laparoscopic-assisted vaginal hysterectomy with bilateral salpingo-oophorectomy, posterior repair, sacrospinous ligament suspension and cystoscopy.  For complete history and physical, please see the dictated note.  HOSPITAL COURSE:  The patient underwent the above-noted surgery.  Please see operative note.  Postop did well.  Postop hemoglobin was 8.6.  On her first postop day, her vaginal pack was removed.  There was no active vaginal bleeding.  Both incisions were intact.  Her abdomen was soft. Bowel sounds were active.  Foley had just been discontinued.  She was tolerating her diet and had been ambulating.  She will be discharged to home.  In terms of complications, none were encountered during her stay in the hospital.  The patient discharged home in stable condition.  DISPOSITION:  The patient is to avoid heavy lifting, vaginal entrance, or driving a car.  Instructed to call should there be any signs of infection in terms of fever.  Heavy vaginal bleeding should be reported. Severe pain should be reported.  Nausea, vomiting should be reported. Also instructed the signs and symptoms of deep venous thrombosis and pulmonary embolus.  Discharged to home on Percocet as needed for pain. She will pick up iron sulfate supplementation.  She will be called tomorrow by the office and follow up will be arranged.     Darlyn Chamber, M.D.     JSM/MEDQ  D:  04/13/2016  T:  04/14/2016  Job:  [id number]

## 2016-04-16 ENCOUNTER — Encounter (HOSPITAL_BASED_OUTPATIENT_CLINIC_OR_DEPARTMENT_OTHER): Payer: Self-pay | Admitting: Obstetrics and Gynecology

## 2016-07-03 ENCOUNTER — Emergency Department (HOSPITAL_COMMUNITY): Payer: 59

## 2016-07-03 ENCOUNTER — Encounter (HOSPITAL_COMMUNITY): Payer: Self-pay | Admitting: *Deleted

## 2016-07-03 ENCOUNTER — Emergency Department (HOSPITAL_COMMUNITY)
Admission: EM | Admit: 2016-07-03 | Discharge: 2016-07-03 | Disposition: A | Discharge status: Home / Self Care | Payer: 59 | Attending: Emergency Medicine | Admitting: Emergency Medicine

## 2016-07-03 DIAGNOSIS — Y9389 Activity, other specified: Secondary | ICD-10-CM | POA: Diagnosis not present

## 2016-07-03 DIAGNOSIS — Y92009 Unspecified place in unspecified non-institutional (private) residence as the place of occurrence of the external cause: Secondary | ICD-10-CM | POA: Diagnosis not present

## 2016-07-03 DIAGNOSIS — R222 Localized swelling, mass and lump, trunk: Secondary | ICD-10-CM | POA: Insufficient documentation

## 2016-07-03 DIAGNOSIS — F1010 Alcohol abuse, uncomplicated: Secondary | ICD-10-CM | POA: Diagnosis not present

## 2016-07-03 DIAGNOSIS — Y999 Unspecified external cause status: Secondary | ICD-10-CM | POA: Diagnosis not present

## 2016-07-03 DIAGNOSIS — R51 Headache: Secondary | ICD-10-CM | POA: Diagnosis present

## 2016-07-03 DIAGNOSIS — S20219A Contusion of unspecified front wall of thorax, initial encounter: Secondary | ICD-10-CM | POA: Insufficient documentation

## 2016-07-03 DIAGNOSIS — Z79899 Other long term (current) drug therapy: Secondary | ICD-10-CM | POA: Diagnosis not present

## 2016-07-03 DIAGNOSIS — W19XXXA Unspecified fall, initial encounter: Secondary | ICD-10-CM | POA: Insufficient documentation

## 2016-07-03 LAB — COMPREHENSIVE METABOLIC PANEL WITH GFR
ALT: 18 U/L (ref 14–54)
AST: 20 U/L (ref 15–41)
Albumin: 4.3 g/dL (ref 3.5–5.0)
Alkaline Phosphatase: 89 U/L (ref 38–126)
Anion gap: 9 (ref 5–15)
BUN: 16 mg/dL (ref 6–20)
CO2: 22 mmol/L (ref 22–32)
Calcium: 9.2 mg/dL (ref 8.9–10.3)
Chloride: 108 mmol/L (ref 101–111)
Creatinine, Ser: 0.87 mg/dL (ref 0.44–1.00)
GFR calc Af Amer: >60 mL/min
GFR calc non Af Amer: >60 mL/min
Glucose, Bld: 106 mg/dL — ABNORMAL HIGH (ref 65–99)
Potassium: 3.8 mmol/L (ref 3.5–5.1)
Sodium: 139 mmol/L (ref 135–145)
Total Bilirubin: 0.8 mg/dL (ref 0.3–1.2)
Total Protein: 7.5 g/dL (ref 6.5–8.1)

## 2016-07-03 LAB — CBC WITH DIFFERENTIAL/PLATELET
Basophils Absolute: 0 10*3/uL (ref 0.0–0.1)
Basophils Relative: 0 %
Eosinophils Absolute: 0.1 10*3/uL (ref 0.0–0.7)
Eosinophils Relative: 2 %
HCT: 36.2 % (ref 36.0–46.0)
Hemoglobin: 12.4 g/dL (ref 12.0–15.0)
Lymphocytes Relative: 24 %
Lymphs Abs: 2.2 10*3/uL (ref 0.7–4.0)
MCH: 28.8 pg (ref 26.0–34.0)
MCHC: 34.3 g/dL (ref 30.0–36.0)
MCV: 84.2 fL (ref 78.0–100.0)
Monocytes Absolute: 0.6 10*3/uL (ref 0.1–1.0)
Monocytes Relative: 7 %
Neutro Abs: 6.2 10*3/uL (ref 1.7–7.7)
Neutrophils Relative %: 67 %
Platelets: 277 10*3/uL (ref 150–400)
RBC: 4.3 MIL/uL (ref 3.87–5.11)
RDW: 15 % (ref 11.5–15.5)
WBC: 9.2 10*3/uL (ref 4.0–10.5)

## 2016-07-03 MED ORDER — CHLORDIAZEPOXIDE HCL 25 MG PO CAPS: ORAL_CAPSULE | ORAL | 0 refills | Status: DC

## 2016-07-03 MED ORDER — HYDROMORPHONE HCL 1 MG/ML IJ SOLN
1.0000 mg | Freq: Once | INTRAMUSCULAR | Status: AC
Start: 2016-07-03 — End: 2016-07-03
  Administered 2016-07-03: 1 mg via INTRAMUSCULAR
  Filled 2016-07-03: qty 1

## 2016-07-03 MED ORDER — LORAZEPAM 1 MG PO TABS
0.0000 mg | ORAL_TABLET | Freq: Four times a day (QID) | ORAL | Status: DC
  Administered 2016-07-03: 2 mg via ORAL
  Filled 2016-07-03: qty 2

## 2016-07-03 MED ORDER — OXYCODONE HCL 5 MG PO TABS
5.0000 mg | ORAL_TABLET | Freq: Once | ORAL | Status: AC
  Administered 2016-07-03: 5 mg via ORAL
  Filled 2016-07-03: qty 1

## 2016-07-03 MED ORDER — LORAZEPAM 1 MG PO TABS: 0.0000 mg | ORAL_TABLET | Freq: Two times a day (BID) | ORAL | Status: DC

## 2016-07-03 NOTE — ED Notes (Signed)
Bed: WTR5 Expected date:  Expected time:  Means of arrival:  Comments: 

## 2016-07-03 NOTE — ED Triage Notes (Signed)
Pt states she fell while intoxicated last night, complains of pain in her head, neck, left hip. Pt states she has bruises all over her body.

## 2016-07-03 NOTE — Discharge Instructions (Signed)
Please follow with your primary care doctor in the next 2 days for a check-up. They must obtain records for further management.  ° °Do not hesitate to return to the Emergency Department for any new, worsening or concerning symptoms.  ° °

## 2016-07-03 NOTE — ED Provider Notes (Signed)
PROGRESS NOTE                                                                                                                 This is a sign-out from PA Sam at shift change: Tammy [name] is a 55 y.o. female presenting with 1 week of alcohol binging, has history of alcohol use disorder, had been clean leading up until last week, she states that she drank one fifth of vodka yesterday, does not remember much but she woke up today with a headache, bruising and left hip pain, she is ambulatory, neurologic exam is nonfocal. X-rays are negative. Plan is to follow-up CT head, C-spine and chest (patient has significant bruising on the sternum). She did begin withdrawal and was given Ativan, oxycodone for pain. Patient states that she does not want Librium taper to go home with. Plan is to follow-up CT and discharge accordingly.  Please refer to previous note for full HPI, ROS, PMH and PE.   CT without acute abnormality. Incidentally noted is a apical bleb. Discussed results with patient who is extremely concerned about the bleb, reassured her and asked her to simply follow with her primary care on this. Patient confirms that she does not have any significant detox from alcohol, no history of DTs or seizures. Offered her Librium taper which she would like. I advised her not to combine this with Xanax. Patient is given a Warden/ranger for inpatient detoxification. Requesting additional headache medication. Neurologic exam nonfocal.  Vitals:   07/03/16 1815 07/03/16 2040 07/03/16 2245  BP: 132/79 126/71 133/84  Pulse: 109 82 76  Resp: 20 18 20   Temp: 97.6 F (36.4 C)    TempSrc: Oral    SpO2: 97% 94% 95%  Weight: 104.3 kg    Height: 5\' 6"  (1.676 m)      Medications  LORazepam (ATIVAN) tablet 0-4 mg (2 mg Oral Given 07/03/16 2039)    Followed by  LORazepam (ATIVAN) tablet 0-4 mg (not administered)  oxyCODONE (Oxy IR/ROXICODONE) immediate release tablet 5 mg (5 mg Oral Given 07/03/16 2039)  HYDROmorphone  (DILAUDID) injection 1 mg (1 mg Intramuscular Given 07/03/16 2308)    Evaluation does not show pathology that would require ongoing emergent intervention or inpatient treatment. Pt is hemodynamically stable and mentating appropriately. Discussed findings and plan with patient/guardian, who agrees with care plan. All questions answered. Return precautions discussed and outpatient follow up given.            Elmyra Ricks Pisciotta, PA-C 07/03/16 [place], MD 07/04/16 [phone]

## 2016-07-03 NOTE — ED Provider Notes (Signed)
[facility] DEPT Provider Note   CSN: [id number] Arrival date & time: 07/03/16  1752   History   Chief Complaint Chief Complaint  Patient presents with  . Fall  . Headache    HPI   Tammy [name] is an 55 y.o. female with history of depression and EtOH abuse who presents to the ED for evaluation of multiple injuries after falling while intoxicated last night. She states she has no idea how she fell. She states over the course of the evening last night she drank one fifth of vodka. She states she knows she was somehow able to knock her TV over and break it. States she has a laceration to the back of her head, a big bruise with swelling to her chest, and bruises "all over." She states she took some tylenol with no relief. She states she does have a history of alcohol abuse and typically does not drink that much. She states she has been under a lot of stress recently, however, and has been drinking every day this past week because she is on a binge because she feels depressed. States she took her typical xanax for anxiety earlier today with no relief as well. States she feels a bit anxious and shaky. Last drink was last night. Denies other substance use. Denies chest pain or SOB.  Past Medical History:  Diagnosis Date  . Anxiety   . Arthritis    neck and lower back - deg disc disease, fingers  . Depression   . Frequency of urination   . GERD (gastroesophageal reflux disease)   . History of seizure per pt no seizure since    11-07-2015  in setting of stress and sleep deprivation  /  negative work-up by neurologist unknown idiology  . Hypothyroidism   . Injury of thumb, left    03-31-2016    . Pelvic relaxation   . SUI (stress urinary incontinence, female)   . Wears glasses     Patient Active Problem List   Diagnosis Date Noted  . Pelvic relaxation 04/13/2016    Class: Present on Admission  . S/P hysterectomy 04/12/2016  . Depression 11/16/2015  . Generalized anxiety disorder  11/16/2015  . Convulsion ([place]) 11/16/2015  . SUI (stress urinary incontinence, female) 09/26/2012    Past Surgical History:  Procedure Laterality Date  . ANTERIOR AND POSTERIOR REPAIR  09/25/2012   Procedure: ANTERIOR (CYSTOCELE) AND POSTERIOR REPAIR (RECTOCELE);  Surgeon: Lahoma Crocker, MD;  Location: [facility];  Service: Gynecology;  Laterality: N/A;  . ANTERIOR AND POSTERIOR REPAIR WITH SACROSPINOUS FIXATION N/A 04/12/2016   Procedure: ANTERIOR AND POSTERIOR REPAIR WITH SACROSPINOUS LIGAMENT SUSPENSION, CYSTOSCOPY;  Surgeon: Arvella Nigh, MD;  Location: [facility];  Service: Gynecology;  Laterality: N/A;  . APPENDECTOMY  1987  . BREAST REDUCTION SURGERY  2000  aprrox  . ENDOMETRIAL ABLATION W/ NOVASURE  2011   in office  . EXCISION LEFT BREAST BX  12-07-1999   benign  . LAPAROSCOPIC CHOLECYSTECTOMY  02-09-2005  . LAPAROSCOPIC VAGINAL HYSTERECTOMY WITH SALPINGO OOPHORECTOMY Bilateral 04/12/2016   Procedure: LAPAROSCOPIC ASSISTED VAGINAL HYSTERECTOMY WITH SALPINGO OOPHORECTOMY;  Surgeon: Arvella Nigh, MD;  Location: [facility];  Service: Gynecology;  Laterality: Bilateral;  . ROUX-EN-Y GASTRIC BYPASS  06/  2002  . TUBAL LIGATION  10/ 1990    OB History    No data available       Home Medications    Prior to Admission medications   Medication Sig Start Date  End Date Taking? Authorizing Provider  ALPRAZolam Duanne Moron) 0.5 MG tablet Take 0.5 mg by mouth 3 (three) times daily as needed for anxiety.     Historical Provider, MD  aspirin-acetaminophen-caffeine (EXCEDRIN MIGRAINE) [phone] MG tablet Take 2 tablets by mouth 2 (two) times daily as needed for migraine.    Historical Provider, MD  cimetidine (TAGAMET) 200 MG tablet Take 200 mg by mouth daily as needed (for heartburn).    Historical Provider, MD  diclofenac sodium (VOLTAREN) 1 % GEL Apply 2 g topically at bedtime.     Historical Provider, MD  levothyroxine (SYNTHROID, LEVOTHROID) 75 MCG tablet  Take 75 mcg by mouth daily before breakfast.  09/29/14   Historical Provider, MD  Melatonin 10 MG TABS Take 10 mg by mouth at bedtime as needed (sleep).     Historical Provider, MD  Multiple Vitamin (MULTIVITAMIN WITH MINERALS) TABS Take 1 tablet by mouth daily.    Historical Provider, MD  oxybutynin (DITROPAN-XL) 10 MG 24 hr tablet Take 10 mg by mouth every morning. 03/22/16   Historical Provider, MD  oxyCODONE-acetaminophen (PERCOCET) 7.5-325 MG tablet Take 1 tablet by mouth every 4 (four) hours as needed for severe pain. 04/13/16   Arvella Nigh, MD  sertraline (ZOLOFT) 50 MG tablet Take 50 mg by mouth every morning.     Historical Provider, MD  zolpidem (AMBIEN) 10 MG tablet Take 10 mg by mouth at bedtime as needed for sleep.  10/29/15   Historical Provider, MD    Family History Family History  Problem Relation Age of Onset  . Colon polyps Mother   . Breast cancer Maternal Aunt   . Stomach cancer Maternal Grandmother   . Diabetes Maternal Grandmother   . Heart disease Maternal Grandmother   . Stomach cancer Maternal Uncle   . Colon cancer Maternal Uncle     x3  . Diabetes Maternal Aunt     Type I  . Diabetes Maternal Uncle     x5 - Type II  . Heart disease Paternal Grandmother   . Esophageal cancer Neg Hx   . Gallbladder disease Neg Hx   . Seizures Neg Hx     Social History Social History  Substance Use Topics  . Smoking status: Never Smoker  . Smokeless tobacco: Never Used  . Alcohol use No     Comment: drinks daily - none since 08/2012 per pt.     Allergies   Adhesive [tape]; Latex; and Sulfa antibiotics   Review of Systems Review of Systems 10 Systems reviewed and are negative for acute change except as noted in the HPI.  Physical Exam Updated Vital Signs BP 132/79 (BP Location: Left Arm)   Pulse 109   Temp 97.6 F (36.4 C) (Oral)   Resp 20   Ht 5\' 6"  (1.676 m)   Wt 104.3 kg   LMP 09/30/2012   SpO2 97%   BMI 37.12 kg/m   Physical Exam  Constitutional:  She is oriented to person, place, and time.  Anxious appearing, mildly tremulous  HENT:  Right Ear: External ear normal.  Left Ear: External ear normal.  Nose: Nose normal.  Mouth/Throat: Oropharynx is clear and moist. No oropharyngeal exudate.  Right posterior occiput with scabbed over 2cm superficial scalp laceration with minimal surrounding tenderness Face with areas of bruising and tenderness to left superior forehead, left mandibular jawline. No trismus. No intraoral lesions or loose teeth. TM NL bilaterally  Eyes: Conjunctivae and EOM are normal.  Right pupil is 16mm bigger  than left pupil but both are round and equally reactive  Neck: Neck supple.  +c-spine tenderness at base of neck  Cardiovascular: Regular rhythm, normal heart sounds and intact distal pulses.   Mildly tachycardic (low 100s)  Pulmonary/Chest: Effort normal and breath sounds normal. No respiratory distress. She has no wheezes. She has no rales.  Abdominal: Soft. Bowel sounds are normal. She exhibits no distension. There is no tenderness. There is no rebound and no guarding.  Musculoskeletal: She exhibits no edema.  Several bruises bilateral forearms but minimal tenderness no deformity No t-spine tenderness +L spine tenderness no stepoff or deformity Left lateral hip with area of ecchymosis and tenderness. FROM with no laxity or creipitus Several bruises bilateral shins with mild tenderness no edema no deformity Right fifth toe and lateral foot edematous and tender. Brisk cap refill x 5 2+ peripheral pulses all 4 exttremities  Lymphadenopathy:    She has no cervical adenopathy.  Neurological: She is alert and oriented to person, place, and time. No cranial nerve deficit.  5/5 strength throughout Normal finger to nose No pronator drift  Skin: Skin is warm and dry.  Psychiatric: She has a normal mood and affect.  Nursing note and vitals reviewed.  ED Treatments / Results  Labs (all labs ordered are listed,  but only abnormal results are displayed) Labs Reviewed  COMPREHENSIVE METABOLIC PANEL - Abnormal; Notable for the following:       Result Value   Glucose, Bld 106 (*)    All other components within normal limits  CBC WITH DIFFERENTIAL/PLATELET    EKG  EKG Interpretation None       Radiology Dg Foot Complete Right  Result Date: 07/03/2016 CLINICAL DATA:  Right foot pain after fall yesterday. EXAM: RIGHT FOOT COMPLETE - 3+ VIEW COMPARISON:  None. FINDINGS: There is no evidence of fracture or dislocation. There is no evidence of arthropathy or other focal bone abnormality. Soft tissues are unremarkable. IMPRESSION: Normal right foot. Electronically Signed   By: Marijo Conception, M.D.   On: 07/03/2016 21:09   Dg Hip Unilat W Or Wo Pelvis 2-3 Views Left  Result Date: 07/03/2016 CLINICAL DATA:  Left hip pain after fall yesterday. EXAM: DG HIP (WITH OR WITHOUT PELVIS) 2-3V LEFT COMPARISON:  None. FINDINGS: There is no evidence of hip fracture or dislocation. There is no evidence of arthropathy or other focal bone abnormality. IMPRESSION: Normal left hip. Electronically Signed   By: Marijo Conception, M.D.   On: 07/03/2016 21:06    Procedures Procedures (including critical care time)  Medications Ordered in ED Medications  LORazepam (ATIVAN) tablet 0-4 mg (2 mg Oral Given 07/03/16 2039)    Followed by  LORazepam (ATIVAN) tablet 0-4 mg (not administered)  oxyCODONE (Oxy IR/ROXICODONE) immediate release tablet 5 mg (5 mg Oral Given 07/03/16 2039)     Initial Impression / Assessment and Plan / ED Course  I have reviewed the triage vital signs and the nursing notes.  Pertinent labs & imaging results that were available during my care of the patient were reviewed by me and considered in my medical decision making (see chart for details).  Clinical Course    Will obtain CT head, c-spine, and maxillofacial. Will obtain CT chest. Will obtain x-ray of left hip and right foot as well as lumbar  spine. Pt admits she was "way too intoxicated" last night and likely fell "a bunch." She is accompanied by her husband and they both deny any kind of physical  assault or violence. Will check basic labs to make sure pt is not anemic, thrombocytopenic, etc and check CMP.   9:37 PM X-rays negative. CT is still pending. At end of shift will sign out to N. Piscotta, PA-C. Anticipate disposition as d/c home if CT negative. Pt declines resources for EtOH abuse, states she has the resources she needs.  Final Clinical Impressions(s) / ED Diagnoses   Final diagnoses:  None    New Prescriptions New Prescriptions   No medications on file     Carmelina Dane 07/03/16 2147    Fredia Sorrow, MD 07/04/16 [phone]

## 2016-08-30 ENCOUNTER — Ambulatory Visit (INDEPENDENT_AMBULATORY_CARE_PROVIDER_SITE_OTHER): Payer: 59 | Admitting: Orthopaedic Surgery

## 2016-08-30 ENCOUNTER — Encounter (INDEPENDENT_AMBULATORY_CARE_PROVIDER_SITE_OTHER): Payer: Self-pay | Admitting: Orthopaedic Surgery

## 2016-08-30 ENCOUNTER — Ambulatory Visit (INDEPENDENT_AMBULATORY_CARE_PROVIDER_SITE_OTHER): Payer: 59

## 2016-08-30 DIAGNOSIS — M7541 Impingement syndrome of right shoulder: Secondary | ICD-10-CM | POA: Diagnosis not present

## 2016-08-30 DIAGNOSIS — M25571 Pain in right ankle and joints of right foot: Secondary | ICD-10-CM

## 2016-08-30 DIAGNOSIS — M7542 Impingement syndrome of left shoulder: Secondary | ICD-10-CM | POA: Diagnosis not present

## 2016-08-30 DIAGNOSIS — M722 Plantar fascial fibromatosis: Secondary | ICD-10-CM

## 2016-08-30 MED ORDER — BUPIVACAINE HCL 0.5 % IJ SOLN
3.0000 mL | INTRAMUSCULAR | Status: AC | PRN
  Administered 2016-08-30: 3 mL via INTRA_ARTICULAR

## 2016-08-30 MED ORDER — LIDOCAINE HCL 1 % IJ SOLN
1.0000 mL | INTRAMUSCULAR | Status: AC | PRN
  Administered 2016-08-30: 1 mL

## 2016-08-30 MED ORDER — BUPIVACAINE HCL 0.5 % IJ SOLN
1.0000 mL | INTRAMUSCULAR | Status: AC | PRN
  Administered 2016-08-30: 1 mL

## 2016-08-30 MED ORDER — LIDOCAINE HCL 1 % IJ SOLN
3.0000 mL | INTRAMUSCULAR | Status: AC | PRN
  Administered 2016-08-30: 3 mL

## 2016-08-30 MED ORDER — METHYLPREDNISOLONE ACETATE 40 MG/ML IJ SUSP
40.0000 mg | INTRAMUSCULAR | Status: AC | PRN
  Administered 2016-08-30: 40 mg via INTRA_ARTICULAR

## 2016-08-30 MED ORDER — TRAMADOL HCL 50 MG PO TABS: 50.0000 mg | ORAL_TABLET | Freq: Four times a day (QID) | ORAL | 2 refills | Status: DC | PRN

## 2016-08-30 MED ORDER — METHYLPREDNISOLONE ACETATE 40 MG/ML IJ SUSP
40.0000 mg | INTRAMUSCULAR | Status: AC | PRN
  Administered 2016-08-30: 40 mg

## 2016-08-30 NOTE — Progress Notes (Signed)
Office Visit Note   Patient: Tammy [name]           Date of Birth: 18-Jun-1961           MRN: [id number] Visit Date: 08/30/2016              Requested by: Stephens Shire, MD [address] [place], [place] [address] PCP: Stephens Shire, MD   Assessment & Plan: Visit Diagnoses:  1. Impingement syndrome of left shoulder   2. Impingement syndrome of right shoulder   3. Plantar fasciitis of right foot     Plan: Bilateral subacromial injections were performed today under sterile conditions in addition to a plantar fascial injection for the right side. I do recommend MRI of both shoulders now that she's had continued pain despite temporary relief from injections. I also recommended stretching and I demonstrated exercises to her for the plantar fasciitis. Follow up after the MRI  Follow-Up Instructions: Return in about 2 weeks (around 09/13/2016) for review MRI.   Orders:  Orders Placed This Encounter  Procedures  . Large Joint Injection/Arthrocentesis  . Large Joint Injection/Arthrocentesis  . Foot Injection  . XR Shoulder Right  . XR Shoulder Left  . XR Foot Complete Right  . MR SHOULDER RIGHT WO CONTRAST  . MR Shoulder Left w/o contrast   Meds ordered this encounter  Medications  . traMADol (ULTRAM) 50 MG tablet    Sig: Take 1 tablet (50 mg total) by mouth every 6 (six) hours as needed.    Dispense:  30 tablet    Refill:  2      Procedures: Large Joint Inj Date/Time: 08/30/2016 4:15 PM Performed by: Leandrew Koyanagi Authorized by: Leandrew Koyanagi   Consent Given by:  Patient Timeout: prior to procedure the correct patient, procedure, and site was verified   Indications:  Pain Location:  Shoulder Site:  R subacromial bursa Prep: patient was prepped and draped in usual sterile fashion   Needle Size:  22 G Approach:  Posterior Ultrasound Guidance: No   Fluoroscopic Guidance: No   Large Joint Inj Date/Time: 08/30/2016 4:15 PM Performed by: Leandrew Koyanagi Authorized by: Leandrew Koyanagi   Consent Given by:  Patient Timeout: prior to procedure the correct patient, procedure, and site was verified   Location:  Shoulder Site:  L subacromial bursa Prep: patient was prepped and draped in usual sterile fashion   Needle Size:  22 G Approach:  Posterior Ultrasound Guidance: No   Fluoroscopic Guidance: No   Arthrogram: No   Medications:  3 mL lidocaine 1 %; 3 mL bupivacaine 0.5 %; 40 mg methylPREDNISolone acetate 40 MG/ML Foot Inj Date/Time: 08/30/2016 4:16 PM Performed by: Leandrew Koyanagi Authorized by: Leandrew Koyanagi   Consent Given by:  Patient Timeout: prior to procedure the correct patient, procedure, and site was verified   Indications:  Pain Condition: Plantar Fasciitis   Location: right plantar fascia muscle   Prep: patient was prepped and draped in usual sterile fashion   Needle Size:  25 G Medications:  1 mL bupivacaine 0.5 %; 1 mL lidocaine 1 %; 40 mg methylPREDNISolone acetate 40 MG/ML     Clinical Data: No additional findings.   Subjective: Chief Complaint  Patient presents with  . Right Shoulder - Pain  . Left Shoulder - Pain  . Right Foot - Pain    HEEL PAIN    HPI Patient comes in today for bilateral shoulder  pain right heel pain. She has had multiple subacromial injections for his shoulders with temporary relief by her primary care physician and Dr. Sharol Given. She also has right heel pain. The pain does not radiate. It is worse with ambulation. Denies any injuries. Denies any constitutional symptoms. Review of Systems Complete review of systems Negative except for history of present illness  Objective: Vital Signs: LMP 09/30/2012   Physical Exam Well-developed well-nourished no acute distress alert 3 Ortho Exam Exam of the left shoulder shows mildly positive Hawkins impingement and Neer impingement. Rotator cuff testing is intact. Exam of the right shoulder shows moderately positive Hawkins impingement and Neer  impingement. Rotator cuff testing is intact. Exam of the right heel shows tenderness to palpation at the insertion of the plantar fascia. Achilles insertion is not tender. There is no skin changes no warmth no signs of infection. Specialty Comments:  No specialty comments available.  Imaging: Xr Foot Complete Right  Result Date: 08/30/2016 Plantar calcaneal bone spur. No acute findings  Xr Shoulder Left  Result Date: 08/30/2016 No acute findings. Acromioclavicular joint arthropathy. Type II acromion.  Xr Shoulder Right  Result Date: 08/30/2016 No acute findings of the right shoulder. Type II acromion    PMFS History: Patient Active Problem List   Diagnosis Date Noted  . Impingement syndrome of left shoulder 08/30/2016  . Impingement syndrome of right shoulder 08/30/2016  . Plantar fasciitis of right foot 08/30/2016  . Pelvic relaxation 04/13/2016    Class: Present on Admission  . S/P hysterectomy 04/12/2016  . Depression 11/16/2015  . Generalized anxiety disorder 11/16/2015  . Convulsion ([place]) 11/16/2015  . SUI (stress urinary incontinence, female) 09/26/2012   Past Medical History:  Diagnosis Date  . Anxiety   . Arthritis    neck and lower back - deg disc disease, fingers  . Depression   . Frequency of urination   . GERD (gastroesophageal reflux disease)   . History of seizure per pt no seizure since    11-07-2015  in setting of stress and sleep deprivation  /  negative work-up by neurologist unknown idiology  . Hypothyroidism   . Injury of thumb, left    03-31-2016    . Pelvic relaxation   . SUI (stress urinary incontinence, female)   . Wears glasses     Family History  Problem Relation Age of Onset  . Colon polyps Mother   . Breast cancer Maternal Aunt   . Stomach cancer Maternal Grandmother   . Diabetes Maternal Grandmother   . Heart disease Maternal Grandmother   . Stomach cancer Maternal Uncle   . Colon cancer Maternal Uncle     x3  . Diabetes  Maternal Aunt     Type I  . Diabetes Maternal Uncle     x5 - Type II  . Heart disease Paternal Grandmother   . Esophageal cancer Neg Hx   . Gallbladder disease Neg Hx   . Seizures Neg Hx     Past Surgical History:  Procedure Laterality Date  . ANTERIOR AND POSTERIOR REPAIR  09/25/2012   Procedure: ANTERIOR (CYSTOCELE) AND POSTERIOR REPAIR (RECTOCELE);  Surgeon: Lahoma Crocker, MD;  Location: [facility];  Service: Gynecology;  Laterality: N/A;  . ANTERIOR AND POSTERIOR REPAIR WITH SACROSPINOUS FIXATION N/A 04/12/2016   Procedure: ANTERIOR AND POSTERIOR REPAIR WITH SACROSPINOUS LIGAMENT SUSPENSION, CYSTOSCOPY;  Surgeon: Arvella Nigh, MD;  Location: [facility];  Service: Gynecology;  Laterality: N/A;  . APPENDECTOMY  1987  . BREAST REDUCTION  SURGERY  2000  aprrox  . ENDOMETRIAL ABLATION W/ NOVASURE  2011   in office  . EXCISION LEFT BREAST BX  12-07-1999   benign  . LAPAROSCOPIC CHOLECYSTECTOMY  02-09-2005  . LAPAROSCOPIC VAGINAL HYSTERECTOMY WITH SALPINGO OOPHORECTOMY Bilateral 04/12/2016   Procedure: LAPAROSCOPIC ASSISTED VAGINAL HYSTERECTOMY WITH SALPINGO OOPHORECTOMY;  Surgeon: Arvella Nigh, MD;  Location: [facility];  Service: Gynecology;  Laterality: Bilateral;  . ROUX-EN-Y GASTRIC BYPASS  06/  2002  . TUBAL LIGATION  10/ 1990   Social History   Occupational History  . [employer]    Social History Main Topics  . Smoking status: Never Smoker  . Smokeless tobacco: Never Used  . Alcohol use No     Comment: drinks daily - none since 08/2012 per pt.  . Drug use: No  . Sexual activity: Yes    Birth control/ protection: Surgical

## 2016-09-08 ENCOUNTER — Other Ambulatory Visit: Payer: 59

## 2016-09-13 ENCOUNTER — Ambulatory Visit (INDEPENDENT_AMBULATORY_CARE_PROVIDER_SITE_OTHER): Payer: 59 | Admitting: Orthopaedic Surgery

## 2016-09-18 ENCOUNTER — Inpatient Hospital Stay: Admission: RE | Admit: 2016-09-18 | Payer: 59 | Source: Ambulatory Visit

## 2016-09-21 ENCOUNTER — Ambulatory Visit (INDEPENDENT_AMBULATORY_CARE_PROVIDER_SITE_OTHER): Payer: 59 | Admitting: Orthopaedic Surgery

## 2016-10-31 ENCOUNTER — Encounter (HOSPITAL_COMMUNITY): Payer: Self-pay | Admitting: Emergency Medicine

## 2016-10-31 ENCOUNTER — Ambulatory Visit (HOSPITAL_COMMUNITY)
Admission: EM | Admit: 2016-10-31 | Discharge: 2016-10-31 | Disposition: A | Discharge status: Home / Self Care | Payer: 59 | Attending: Family Medicine | Admitting: Family Medicine

## 2016-10-31 DIAGNOSIS — L600 Ingrowing nail: Secondary | ICD-10-CM

## 2016-10-31 MED ORDER — HYDROCODONE-ACETAMINOPHEN 5-325 MG PO TABS: 1.0000 | ORAL_TABLET | ORAL | 0 refills | Status: DC | PRN

## 2016-10-31 NOTE — ED Triage Notes (Signed)
The patient presented to the [facility] with a complaint of a possible toe nail infection on her right great toe x 2 weeks.

## 2016-10-31 NOTE — Discharge Instructions (Signed)
See podiatrist for further care of nail

## 2016-10-31 NOTE — ED Notes (Signed)
Patient's right great tow dressed with xeroform gauze and wrapped with coban tape per provider's verbal order.

## 2016-10-31 NOTE — ED Provider Notes (Signed)
[facility]    CSN: [id number] Arrival date & time: 10/31/16  1438     History   Chief Complaint Chief Complaint  Patient presents with  . Nail Problem    HPI Tammy [name] is a 56 y.o. female.   The history is provided by the patient.  Toe Pain  This is a new problem. The current episode started more than 1 week ago (2 weeks.).    Past Medical History:  Diagnosis Date  . Anxiety   . Arthritis    neck and lower back - deg disc disease, fingers  . Depression   . Frequency of urination   . GERD (gastroesophageal reflux disease)   . History of seizure per pt no seizure since    11-07-2015  in setting of stress and sleep deprivation  /  negative work-up by neurologist unknown idiology  . Hypothyroidism   . Injury of thumb, left    03-31-2016    . Pelvic relaxation   . SUI (stress urinary incontinence, female)   . Wears glasses     Patient Active Problem List   Diagnosis Date Noted  . Impingement syndrome of left shoulder 08/30/2016  . Impingement syndrome of right shoulder 08/30/2016  . Plantar fasciitis of right foot 08/30/2016  . Pelvic relaxation 04/13/2016    Class: Present on Admission  . S/P hysterectomy 04/12/2016  . Depression 11/16/2015  . Generalized anxiety disorder 11/16/2015  . Convulsion ([place]) 11/16/2015  . SUI (stress urinary incontinence, female) 09/26/2012    Past Surgical History:  Procedure Laterality Date  . ANTERIOR AND POSTERIOR REPAIR  09/25/2012   Procedure: ANTERIOR (CYSTOCELE) AND POSTERIOR REPAIR (RECTOCELE);  Surgeon: Lahoma Crocker, MD;  Location: [facility];  Service: Gynecology;  Laterality: N/A;  . ANTERIOR AND POSTERIOR REPAIR WITH SACROSPINOUS FIXATION N/A 04/12/2016   Procedure: ANTERIOR AND POSTERIOR REPAIR WITH SACROSPINOUS LIGAMENT SUSPENSION, CYSTOSCOPY;  Surgeon: Arvella Nigh, MD;  Location: [facility];  Service: Gynecology;  Laterality: N/A;  . APPENDECTOMY  1987  . BREAST REDUCTION SURGERY   2000  aprrox  . ENDOMETRIAL ABLATION W/ NOVASURE  2011   in office  . EXCISION LEFT BREAST BX  12-07-1999   benign  . LAPAROSCOPIC CHOLECYSTECTOMY  02-09-2005  . LAPAROSCOPIC VAGINAL HYSTERECTOMY WITH SALPINGO OOPHORECTOMY Bilateral 04/12/2016   Procedure: LAPAROSCOPIC ASSISTED VAGINAL HYSTERECTOMY WITH SALPINGO OOPHORECTOMY;  Surgeon: Arvella Nigh, MD;  Location: [facility];  Service: Gynecology;  Laterality: Bilateral;  . ROUX-EN-Y GASTRIC BYPASS  06/  2002  . TUBAL LIGATION  10/ 1990    OB History    No data available       Home Medications    Prior to Admission medications   Medication Sig Start Date End Date Taking? Authorizing Provider  levothyroxine (SYNTHROID, LEVOTHROID) 75 MCG tablet Take 75 mcg by mouth daily before breakfast.  09/29/14  Yes Historical Provider, MD  oxybutynin (DITROPAN-XL) 5 MG 24 hr tablet Take 5 mg by mouth at bedtime.   Yes Historical Provider, MD  sertraline (ZOLOFT) 100 MG tablet Take 100 mg by mouth daily.   Yes Historical Provider, MD  chlordiazePOXIDE (LIBRIUM) 25 MG capsule 50mg  PO TID x 1D, then 25-50mg  PO BID X 1D, then 25-50mg  PO QD X 1D Patient not taking: Reported on 08/30/2016 07/03/16   Elmyra Ricks Pisciotta, PA-C  HYDROcodone-acetaminophen (NORCO/VICODIN) 5-325 MG tablet Take 1 tablet by mouth every 4 (four) hours as needed. 10/31/16   Billy Fischer, MD  Family History Family History  Problem Relation Age of Onset  . Colon polyps Mother   . Breast cancer Maternal Aunt   . Stomach cancer Maternal Grandmother   . Diabetes Maternal Grandmother   . Heart disease Maternal Grandmother   . Stomach cancer Maternal Uncle   . Colon cancer Maternal Uncle     x3  . Diabetes Maternal Aunt     Type I  . Diabetes Maternal Uncle     x5 - Type II  . Heart disease Paternal Grandmother   . Esophageal cancer Neg Hx   . Gallbladder disease Neg Hx   . Seizures Neg Hx     Social History Social History  Substance Use Topics  .  Smoking status: Never Smoker  . Smokeless tobacco: Never Used  . Alcohol use No     Comment: drinks daily - none since 08/2012 per pt.     Allergies   Nsaids; Adhesive [tape]; Latex; and Sulfa antibiotics   Review of Systems Review of Systems  Constitutional: Negative.   Musculoskeletal: Positive for gait problem.  Skin: Positive for wound.  All other systems reviewed and are negative.    Physical Exam Triage Vital Signs ED Triage Vitals  Enc Vitals Group     BP 10/31/16 1606 151/83     Pulse Rate 10/31/16 1606 84     Resp 10/31/16 1606 16     Temp 10/31/16 1606 98.3 F (36.8 C)     Temp Source 10/31/16 1606 Oral     SpO2 10/31/16 1606 100 %     Weight --      Height --      Head Circumference --      Peak Flow --      Pain Score 10/31/16 1622 7     Pain Loc --      Pain Edu? --      Excl. in [place]? --    No data found.   Updated Vital Signs BP 151/83 (BP Location: Left Wrist)   Pulse 84   Temp 98.3 F (36.8 C) (Oral)   Resp 16   LMP 09/30/2012   SpO2 100%   Visual Acuity Right Eye Distance:   Left Eye Distance:   Bilateral Distance:    Right Eye Near:   Left Eye Near:    Bilateral Near:     Physical Exam  Constitutional: She is oriented to person, place, and time. She appears well-developed and well-nourished.  Musculoskeletal: She exhibits tenderness.  Ingrown right gt toenail medially with granulation tissue. No infection.  Neurological: She is alert and oriented to person, place, and time.  Skin: Skin is warm and dry.  Nursing note and vitals reviewed.    UC Treatments / Results  Labs (all labs ordered are listed, but only abnormal results are displayed) Labs Reviewed - No data to display  EKG  EKG Interpretation None       Radiology No results found.  Procedures .Nail Removal Date/Time: 10/31/2016 5:50 PM Performed by: Billy Fischer Authorized by: Ihor Gully D   Consent:    Consent obtained:  Verbal   Risks discussed:   Pain Location:    Foot:  R big toe Pre-procedure details:    Skin preparation:  Betadine   Preparation: Patient was prepped and draped in the usual sterile fashion   Anesthesia (see MAR for exact dosages):    Anesthesia method:  Local infiltration   Local anesthetic:  Lidocaine 2% w/o epi  Nail Removal:    Nail removed:  Partial   Nail side:  Lateral   Nail bed repaired: no     Removed nail replaced and anchored: no   Ingrown nail:    Wedge excision of skin: no     Nail matrix removed or ablated:  None Post-procedure details:    Dressing:  Non-adhesive packing strip and antibiotic ointment   Patient tolerance of procedure:  Tolerated well, no immediate complications    (including critical care time)  Medications Ordered in UC Medications - No data to display   Initial Impression / Assessment and Plan / UC Course  I have reviewed the triage vital signs and the nursing notes.  Pertinent labs & imaging results that were available during my care of the patient were reviewed by me and considered in my medical decision making (see chart for details).       Final Clinical Impressions(s) / UC Diagnoses   Final diagnoses:  Ingrown right big toenail    New Prescriptions Discharge Medication List as of 10/31/2016  5:55 PM    START taking these medications   Details  HYDROcodone-acetaminophen (NORCO/VICODIN) 5-325 MG tablet Take 1 tablet by mouth every 4 (four) hours as needed., Starting Sun 10/31/2016, Print         Billy Fischer, MD 11/04/16 [phone]

## 2016-10-31 NOTE — ED Notes (Signed)
Patient's right foot placed in a betadine/NaCl2 soak per provider's verbal order.

## 2017-01-20 ENCOUNTER — Ambulatory Visit (INDEPENDENT_AMBULATORY_CARE_PROVIDER_SITE_OTHER): Payer: Self-pay

## 2017-01-20 ENCOUNTER — Encounter (INDEPENDENT_AMBULATORY_CARE_PROVIDER_SITE_OTHER): Payer: Self-pay | Admitting: Orthopaedic Surgery

## 2017-01-20 ENCOUNTER — Ambulatory Visit (INDEPENDENT_AMBULATORY_CARE_PROVIDER_SITE_OTHER): Payer: Self-pay | Admitting: Orthopaedic Surgery

## 2017-01-20 DIAGNOSIS — G8929 Other chronic pain: Secondary | ICD-10-CM

## 2017-01-20 DIAGNOSIS — M25512 Pain in left shoulder: Secondary | ICD-10-CM

## 2017-01-20 DIAGNOSIS — M79671 Pain in right foot: Secondary | ICD-10-CM

## 2017-01-20 DIAGNOSIS — M7542 Impingement syndrome of left shoulder: Secondary | ICD-10-CM

## 2017-01-20 DIAGNOSIS — M7541 Impingement syndrome of right shoulder: Secondary | ICD-10-CM

## 2017-01-20 DIAGNOSIS — M25511 Pain in right shoulder: Secondary | ICD-10-CM

## 2017-01-20 MED ORDER — METHYLPREDNISOLONE ACETATE 40 MG/ML IJ SUSP
40.0000 mg | INTRAMUSCULAR | Status: AC | PRN
  Administered 2017-01-20: 40 mg via INTRA_ARTICULAR

## 2017-01-20 MED ORDER — LIDOCAINE HCL 1 % IJ SOLN
3.0000 mL | INTRAMUSCULAR | Status: AC | PRN
  Administered 2017-01-20: 3 mL

## 2017-01-20 MED ORDER — TRAMADOL HCL 50 MG PO TABS: 50.0000 mg | ORAL_TABLET | Freq: Four times a day (QID) | ORAL | 0 refills | Status: DC | PRN

## 2017-01-20 MED ORDER — BUPIVACAINE HCL 0.5 % IJ SOLN
3.0000 mL | INTRAMUSCULAR | Status: AC | PRN
  Administered 2017-01-20: 3 mL via INTRA_ARTICULAR

## 2017-01-20 NOTE — Progress Notes (Signed)
Office Visit Note   Patient: Tammy [name]           Date of Birth: [birth year]           MRN: [id number] Visit Date: 01/20/2017              Requested by: Stephens Shire, MD [address] [place], [place] [address] PCP: Stephens Shire, MD   Assessment & Plan: Visit Diagnoses:  1. Pain in right foot   2. Chronic pain of both shoulders   3. Impingement syndrome of right shoulder   4. Impingement syndrome of left shoulder     Plan: X-rays shows no evidence of stress fracture. No acute abnormalities. She likely has reactive lateral column pain. Recommend Cam Walker at rest. Tramadol was prescribed. Bilateral subacromial injection was also performed. Follow-up with me as needed.  Follow-Up Instructions: Return if symptoms worsen or fail to improve.   Orders:  Orders Placed This Encounter  Procedures  . XR Foot Complete Right  . XR Shoulder Right  . XR Shoulder Left   Meds ordered this encounter  Medications  . DISCONTD: traMADol (ULTRAM) 50 MG tablet    Sig: Take 1 tablet (50 mg total) by mouth every 6 (six) hours as needed.    Dispense:  30 tablet    Refill:  0  . traMADol (ULTRAM) 50 MG tablet    Sig: Take 1 tablet (50 mg total) by mouth every 6 (six) hours as needed.    Dispense:  30 tablet    Refill:  0      Procedures: Large Joint Inj Date/Time: 01/20/2017 11:42 AM Performed by: Leandrew Koyanagi Authorized by: Leandrew Koyanagi   Consent Given by:  Patient Timeout: prior to procedure the correct patient, procedure, and site was verified   Indications:  Pain Location:  Shoulder Site:  R subacromial bursa Prep: patient was prepped and draped in usual sterile fashion   Needle Size:  22 G Approach:  Posterior Ultrasound Guidance: No   Fluoroscopic Guidance: No   Large Joint Inj Date/Time: 01/20/2017 11:42 AM Performed by: Leandrew Koyanagi Authorized by: Leandrew Koyanagi   Consent Given by:  Patient Timeout: prior to procedure the correct patient,  procedure, and site was verified   Location:  Shoulder Site:  L subacromial bursa Prep: patient was prepped and draped in usual sterile fashion   Needle Size:  22 G Approach:  Posterior Ultrasound Guidance: No   Fluoroscopic Guidance: No   Arthrogram: No   Medications:  3 mL lidocaine 1 %; 3 mL bupivacaine 0.5 %; 40 mg methylPREDNISolone acetate 40 MG/ML     Clinical Data: No additional findings.   Subjective: Chief Complaint  Patient presents with  . Right Foot - Pain  . Right Shoulder - Pain  . Left Shoulder - Pain    Patient comes in today for follow-up of bilateral shoulder pain and right lateral foot pain. She's been ambulating a lot has been ago. Her shoulder didn't really well from the injection. She opted not to have MRI due to insurance reasons. She is requesting bilateral shoulder injections today. She wants to make sure her foot is not fractured.    Review of Systems  Constitutional: Negative.   HENT: Negative.   Eyes: Negative.   Respiratory: Negative.   Cardiovascular: Negative.   Endocrine: Negative.   Musculoskeletal: Negative.   Neurological: Negative.   Hematological: Negative.   Psychiatric/Behavioral: Negative.  All other systems reviewed and are negative.    Objective: Vital Signs: LMP 09/30/2012   Physical Exam  Constitutional: She is oriented to person, place, and time. She appears well-developed and well-nourished.  Pulmonary/Chest: Effort normal.  Neurological: She is alert and oriented to person, place, and time.  Skin: Skin is warm. Capillary refill takes less than 2 seconds.  Psychiatric: She has a normal mood and affect. Her behavior is normal. Judgment and thought content normal.  Nursing note and vitals reviewed.   Ortho Exam Bilateral shoulder exam shows positive impingement signs worse on the right side especially with Hawkins impingement. Rotator cuff is grossly intact. Right foot exam shows no swelling or skin changes. She  has a neutral hindfoot. Mildly flexible flatfoot. Lateral column is slightly tender to palpation. Specialty Comments:  No specialty comments available.  Imaging: Xr Foot Complete Right  Result Date: 01/20/2017 No acute or structural abnormalities. Generalize osteopenia.  Xr Shoulder Left  Result Date: 01/20/2017 No acute for structural normality's  Xr Shoulder Right  Result Date: 01/20/2017 No acute or structural abnormalities    PMFS History: Patient Active Problem List   Diagnosis Date Noted  . Impingement syndrome of left shoulder 08/30/2016  . Impingement syndrome of right shoulder 08/30/2016  . Plantar fasciitis of right foot 08/30/2016  . Pelvic relaxation 04/13/2016    Class: Present on Admission  . S/P hysterectomy 04/12/2016  . Depression 11/16/2015  . Generalized anxiety disorder 11/16/2015  . Convulsion ([place]) 11/16/2015  . SUI (stress urinary incontinence, female) 09/26/2012   Past Medical History:  Diagnosis Date  . Anxiety   . Arthritis    neck and lower back - deg disc disease, fingers  . Depression   . Frequency of urination   . GERD (gastroesophageal reflux disease)   . History of seizure per pt no seizure since    11-07-2015  in setting of stress and sleep deprivation  /  negative work-up by neurologist unknown idiology  . Hypothyroidism   . Injury of thumb, left    03-31-2016    . Pelvic relaxation   . SUI (stress urinary incontinence, female)   . Wears glasses     Family History  Problem Relation Age of Onset  . Colon polyps Mother   . Breast cancer Maternal Aunt   . Stomach cancer Maternal Grandmother   . Diabetes Maternal Grandmother   . Heart disease Maternal Grandmother   . Stomach cancer Maternal Uncle   . Colon cancer Maternal Uncle     x3  . Diabetes Maternal Aunt     Type I  . Diabetes Maternal Uncle     x5 - Type II  . Heart disease Paternal Grandmother   . Esophageal cancer Neg Hx   . Gallbladder disease Neg Hx   .  Seizures Neg Hx     Past Surgical History:  Procedure Laterality Date  . ANTERIOR AND POSTERIOR REPAIR  09/25/2012   Procedure: ANTERIOR (CYSTOCELE) AND POSTERIOR REPAIR (RECTOCELE);  Surgeon: Lahoma Crocker, MD;  Location: [facility];  Service: Gynecology;  Laterality: N/A;  . ANTERIOR AND POSTERIOR REPAIR WITH SACROSPINOUS FIXATION N/A 04/12/2016   Procedure: ANTERIOR AND POSTERIOR REPAIR WITH SACROSPINOUS LIGAMENT SUSPENSION, CYSTOSCOPY;  Surgeon: Arvella Nigh, MD;  Location: [facility];  Service: Gynecology;  Laterality: N/A;  . APPENDECTOMY  1987  . BREAST REDUCTION SURGERY  2000  aprrox  . ENDOMETRIAL ABLATION W/ NOVASURE  2011   in office  . EXCISION LEFT BREAST  BX  12-07-1999   benign  . LAPAROSCOPIC CHOLECYSTECTOMY  02-09-2005  . LAPAROSCOPIC VAGINAL HYSTERECTOMY WITH SALPINGO OOPHORECTOMY Bilateral 04/12/2016   Procedure: LAPAROSCOPIC ASSISTED VAGINAL HYSTERECTOMY WITH SALPINGO OOPHORECTOMY;  Surgeon: Arvella Nigh, MD;  Location: [facility];  Service: Gynecology;  Laterality: Bilateral;  . ROUX-EN-Y GASTRIC BYPASS  06/  2002  . TUBAL LIGATION  10/ 1990   Social History   Occupational History  . [employer]    Social History Main Topics  . Smoking status: Never Smoker  . Smokeless tobacco: Never Used  . Alcohol use No     Comment: drinks daily - none since 08/2012 per pt.  . Drug use: No  . Sexual activity: Yes    Birth control/ protection: Surgical

## 2017-01-26 ENCOUNTER — Other Ambulatory Visit (INDEPENDENT_AMBULATORY_CARE_PROVIDER_SITE_OTHER): Payer: Self-pay

## 2017-01-26 ENCOUNTER — Other Ambulatory Visit (INDEPENDENT_AMBULATORY_CARE_PROVIDER_SITE_OTHER): Payer: Self-pay | Admitting: Orthopaedic Surgery

## 2017-01-26 NOTE — Telephone Encounter (Signed)
Rx script ready for pick up at the front desk. LMOM to let her know.

## 2017-03-13 ENCOUNTER — Encounter (HOSPITAL_COMMUNITY): Payer: Self-pay | Admitting: Emergency Medicine

## 2017-03-13 ENCOUNTER — Ambulatory Visit (HOSPITAL_COMMUNITY)
Admission: EM | Admit: 2017-03-13 | Discharge: 2017-03-13 | Disposition: A | Discharge status: Home / Self Care | Payer: BLUE CROSS/BLUE SHIELD | Attending: Internal Medicine | Admitting: Internal Medicine

## 2017-03-13 DIAGNOSIS — N3000 Acute cystitis without hematuria: Secondary | ICD-10-CM | POA: Diagnosis not present

## 2017-03-13 DIAGNOSIS — E039 Hypothyroidism, unspecified: Secondary | ICD-10-CM | POA: Diagnosis not present

## 2017-03-13 DIAGNOSIS — R600 Localized edema: Secondary | ICD-10-CM | POA: Diagnosis not present

## 2017-03-13 DIAGNOSIS — K219 Gastro-esophageal reflux disease without esophagitis: Secondary | ICD-10-CM | POA: Insufficient documentation

## 2017-03-13 DIAGNOSIS — M7989 Other specified soft tissue disorders: Secondary | ICD-10-CM | POA: Diagnosis present

## 2017-03-13 DIAGNOSIS — Z9884 Bariatric surgery status: Secondary | ICD-10-CM | POA: Insufficient documentation

## 2017-03-13 DIAGNOSIS — Z8371 Family history of colonic polyps: Secondary | ICD-10-CM | POA: Diagnosis not present

## 2017-03-13 DIAGNOSIS — F329 Major depressive disorder, single episode, unspecified: Secondary | ICD-10-CM | POA: Diagnosis not present

## 2017-03-13 DIAGNOSIS — F419 Anxiety disorder, unspecified: Secondary | ICD-10-CM | POA: Insufficient documentation

## 2017-03-13 LAB — POCT URINALYSIS DIP (DEVICE)
Bilirubin Urine: NEGATIVE
Glucose, UA: NEGATIVE mg/dL
Ketones, ur: NEGATIVE mg/dL
Nitrite: POSITIVE — AB
Protein, ur: NEGATIVE mg/dL
Specific Gravity, Urine: 1.015 (ref 1.005–1.030)
Urobilinogen, UA: 1 mg/dL (ref 0.0–1.0)
pH: 6.5 (ref 5.0–8.0)

## 2017-03-13 MED ORDER — PHENAZOPYRIDINE HCL 200 MG PO TABS: 200.0000 mg | ORAL_TABLET | Freq: Three times a day (TID) | ORAL | 0 refills | Status: DC | PRN

## 2017-03-13 MED ORDER — CEPHALEXIN 500 MG PO CAPS: 500.0000 mg | ORAL_CAPSULE | Freq: Four times a day (QID) | ORAL | 0 refills | Status: DC

## 2017-03-13 NOTE — Discharge Instructions (Signed)
You are being treated today for a urinary tract infection. I have prescribed Keflex, take 1 tablet 4 times a day for 5 days. I have also prescribed Pyridium. Take 1 tablet a day 3 times a day for 2 days. Your urine will be sent for culture and you will be notified should any change in therapy be needed. Drink plenty of fluids and rest. Should your symptoms fail to resolve, follow up with your primary care provider or return to clinic.   With regard to peripheral edema, recommend wearing compression stockings, keep her legs elevated whenever possible, and follow up with your primary care provider for further evaluation and management of this condition.

## 2017-03-13 NOTE — ED Triage Notes (Signed)
The patient presented to the [facility] with a complaint of bilateral lower leg edema x 4 days.

## 2017-03-13 NOTE — ED Provider Notes (Signed)
CSN: [id number]     Arrival date & time 03/13/17  1451 History   First MD Initiated Contact with Patient 03/13/17 1623     Chief Complaint  Patient presents with  . Leg Swelling   (Consider location/radiation/quality/duration/timing/severity/associated sxs/prior Treatment) 56 year old female presents to clinic for evaluation of bilateral lower leg edema for previous 4 days. She denies recent travel, denies recent hospitalizations, denies periods of immobility, she does not smoke, is not on oral contraceptives, and denies use of estrogen containing products, denies history of malignancy, denies history of clotting disorders or family history of clotting disorders.  She is also complaining of some dysuria, states she believes she has a UTI.   The history is provided by the patient.    Past Medical History:  Diagnosis Date  . Anxiety   . Arthritis    neck and lower back - deg disc disease, fingers  . Depression   . Frequency of urination   . GERD (gastroesophageal reflux disease)   . History of seizure per pt no seizure since    11-07-2015  in setting of stress and sleep deprivation  /  negative work-up by neurologist unknown idiology  . Hypothyroidism   . Injury of thumb, left    03-31-2016    . Pelvic relaxation   . SUI (stress urinary incontinence, female)   . Wears glasses    Past Surgical History:  Procedure Laterality Date  . ANTERIOR AND POSTERIOR REPAIR  09/25/2012   Procedure: ANTERIOR (CYSTOCELE) AND POSTERIOR REPAIR (RECTOCELE);  Surgeon: Lahoma Crocker, MD;  Location: [facility];  Service: Gynecology;  Laterality: N/A;  . ANTERIOR AND POSTERIOR REPAIR WITH SACROSPINOUS FIXATION N/A 04/12/2016   Procedure: ANTERIOR AND POSTERIOR REPAIR WITH SACROSPINOUS LIGAMENT SUSPENSION, CYSTOSCOPY;  Surgeon: Arvella Nigh, MD;  Location: [facility];  Service: Gynecology;  Laterality: N/A;  . APPENDECTOMY  1987  . BREAST REDUCTION SURGERY  2000  aprrox  . ENDOMETRIAL  ABLATION W/ NOVASURE  2011   in office  . EXCISION LEFT BREAST BX  12-07-1999   benign  . LAPAROSCOPIC CHOLECYSTECTOMY  02-09-2005  . LAPAROSCOPIC VAGINAL HYSTERECTOMY WITH SALPINGO OOPHORECTOMY Bilateral 04/12/2016   Procedure: LAPAROSCOPIC ASSISTED VAGINAL HYSTERECTOMY WITH SALPINGO OOPHORECTOMY;  Surgeon: Arvella Nigh, MD;  Location: [facility];  Service: Gynecology;  Laterality: Bilateral;  . ROUX-EN-Y GASTRIC BYPASS  06/  2002  . TUBAL LIGATION  10/ 1990   Family History  Problem Relation Age of Onset  . Colon polyps Mother   . Breast cancer Maternal Aunt   . Stomach cancer Maternal Grandmother   . Diabetes Maternal Grandmother   . Heart disease Maternal Grandmother   . Stomach cancer Maternal Uncle   . Colon cancer Maternal Uncle        x3  . Diabetes Maternal Aunt        Type I  . Diabetes Maternal Uncle        x5 - Type II  . Heart disease Paternal Grandmother   . Esophageal cancer Neg Hx   . Gallbladder disease Neg Hx   . Seizures Neg Hx    Social History  Substance Use Topics  . Smoking status: Never Smoker  . Smokeless tobacco: Never Used  . Alcohol use No     Comment: drinks daily - none since 08/2012 per pt.   OB History    No data available     Review of Systems  Constitutional: Negative.   HENT: Negative.   Respiratory:  Negative.   Cardiovascular: Positive for leg swelling. Negative for chest pain.  Gastrointestinal: Negative.   Genitourinary: Positive for dysuria and frequency.  Musculoskeletal: Negative.   Skin: Negative.   Neurological: Negative.     Allergies  Nsaids; Adhesive [tape]; Latex; and Sulfa antibiotics  Home Medications   Prior to Admission medications   Medication Sig Start Date End Date Taking? Authorizing Provider  oxybutynin (DITROPAN-XL) 5 MG 24 hr tablet Take 5 mg by mouth at bedtime.   Yes [provider]  sertraline (ZOLOFT) 100 MG tablet Take 100 mg by mouth daily.   Yes [provider]   cephALEXin (KEFLEX) 500 MG capsule Take 1 capsule (500 mg total) by mouth 4 (four) times daily. 03/13/17   Barnet Glasgow, NP  phenazopyridine (PYRIDIUM) 200 MG tablet Take 1 tablet (200 mg total) by mouth 3 (three) times daily as needed for pain. 03/13/17   Barnet Glasgow, NP   Meds Ordered and Administered this Visit  Medications - No data to display  BP (!) 157/73 (BP Location: Left Wrist)   Pulse 81   Temp 98.2 F (36.8 C) (Oral)   Resp 20   LMP 09/30/2012   SpO2 100%  No data found.   Physical Exam  Constitutional: She is oriented to person, place, and time. She appears well-developed and well-nourished. No distress.  HENT:  Head: Normocephalic.  Right Ear: External ear normal.  Left Ear: External ear normal.  Eyes: Conjunctivae are normal.  Neck: Normal range of motion.  Cardiovascular: Normal rate, regular rhythm and normal pulses.   Pulses:      Dorsalis pedis pulses are 2+ on the right side, and 2+ on the left side.  Pulmonary/Chest: Effort normal and breath sounds normal.  Musculoskeletal: She exhibits edema.  Neurological: She is alert and oriented to person, place, and time.  Skin: Skin is warm and dry. Capillary refill takes less than 2 seconds. She is not diaphoretic. No erythema. No pallor.  Nursing note and vitals reviewed.   Urgent Care Course     Procedures (including critical care time)  Labs Review Labs Reviewed  POCT URINALYSIS DIP (DEVICE) - Abnormal; Notable for the following:       Result Value   Hgb urine dipstick SMALL (*)    Nitrite POSITIVE (*)    Leukocytes, UA TRACE (*)    All other components within normal limits  URINE CULTURE    Imaging Review No results found.     MDM   1. Bilateral lower extremity edema   2. Acute cystitis without hematuria     For UTI, started on Keflex and pyridium, urine sent for culture.  For edema, recommend compression stockings, rest, elevation when possible, follow up with primary care  for further management.      Barnet Glasgow, NP 03/13/17 1710

## 2017-03-15 LAB — URINE CULTURE: Culture: >=100000 — AB

## 2017-03-16 ENCOUNTER — Telehealth (HOSPITAL_COMMUNITY): Payer: Self-pay | Admitting: Internal Medicine

## 2017-03-16 MED ORDER — NITROFURANTOIN MONOHYD MACRO 100 MG PO CAPS: 100.0000 mg | ORAL_CAPSULE | Freq: Two times a day (BID) | ORAL | 0 refills | Status: AC

## 2017-03-16 NOTE — Telephone Encounter (Signed)
Clinical staff, please let patient know that urine culture was positive for E coli, resistant to several antibiotics including cephalexin rx given at the urgent care visit 6/10.  Will send rx for nitrofurantoin to pharmacy of record, [facility] on [address] and [address].  Stop cephalexin and take nitrofurantoin instead.  Recheck or followup with primary care provider for further evaluation if symptoms are not improving.  LM

## 2017-03-24 ENCOUNTER — Ambulatory Visit (INDEPENDENT_AMBULATORY_CARE_PROVIDER_SITE_OTHER): Payer: BLUE CROSS/BLUE SHIELD | Admitting: Orthopaedic Surgery

## 2017-03-24 ENCOUNTER — Ambulatory Visit (INDEPENDENT_AMBULATORY_CARE_PROVIDER_SITE_OTHER): Payer: BLUE CROSS/BLUE SHIELD

## 2017-03-24 DIAGNOSIS — M5442 Lumbago with sciatica, left side: Secondary | ICD-10-CM

## 2017-03-24 DIAGNOSIS — M25551 Pain in right hip: Secondary | ICD-10-CM

## 2017-03-24 DIAGNOSIS — M722 Plantar fascial fibromatosis: Secondary | ICD-10-CM

## 2017-03-24 MED ORDER — HYDROCODONE-ACETAMINOPHEN 5-325 MG PO TABS: 1.0000 | ORAL_TABLET | Freq: Four times a day (QID) | ORAL | 0 refills | Status: DC | PRN

## 2017-03-24 MED ORDER — METHOCARBAMOL 500 MG PO TABS: 500.0000 mg | ORAL_TABLET | Freq: Four times a day (QID) | ORAL | 2 refills | Status: DC | PRN

## 2017-03-24 MED ORDER — PREDNISONE 10 MG (21) PO TBPK: ORAL_TABLET | ORAL | 0 refills | Status: DC

## 2017-03-24 NOTE — Progress Notes (Signed)
Office Visit Note   Patient: Tammy [name]           Date of Birth: 01-19-1961           MRN: [id number] Visit Date: 03/24/2017              Requested by: Fanny Bien, [address] [place], [place] [address] PCP: Fanny Bien, MD   Assessment & Plan: Visit Diagnoses:  1. Pain in right hip   2. Acute right-sided low back pain with left-sided sciatica   3. Plantar fasciitis of right foot     Plan: Overall impression is lumbar radiculopathy. Recommend a Robaxin, prednisone taper, Norco, physical therapy. Follow-up as needed.  Follow-Up Instructions: Return if symptoms worsen or fail to improve.   Orders:  Orders Placed This Encounter  Procedures  . XR HIP UNILAT W OR W/O PELVIS 1V RIGHT  . XR Lumbar Spine 2-3 Views   Meds ordered this encounter  Medications  . predniSONE (STERAPRED UNI-PAK 21 TAB) 10 MG (21) TBPK tablet    Sig: Take as directed    Dispense:  21 tablet    Refill:  0  . methocarbamol (ROBAXIN) 500 MG tablet    Sig: Take 1 tablet (500 mg total) by mouth every 6 (six) hours as needed for muscle spasms.    Dispense:  30 tablet    Refill:  2  . HYDROcodone-acetaminophen (NORCO) 5-325 MG tablet    Sig: Take 1-2 tablets by mouth every 6 (six) hours as needed.    Dispense:  30 tablet    Refill:  0      Procedures: No procedures performed   Clinical Data: No additional findings.   Subjective: No chief complaint on file.   Tammy [name] is a 56 year old female comes in with right hip pain and swelling off and on recently. This is been going on for about a week. The pain radiates down into her foot. She endorses mild numbness and tingling. She denies any weakness. Denies any bowel or bladder dysfunction.    Review of Systems  Constitutional: Negative.   HENT: Negative.   Eyes: Negative.   Respiratory: Negative.   Cardiovascular: Negative.   Endocrine: Negative.   Musculoskeletal: Negative.   Neurological: Negative.     Hematological: Negative.   Psychiatric/Behavioral: Negative.   All other systems reviewed and are negative.    Objective: Vital Signs: LMP 09/30/2012   Physical Exam  Constitutional: She is oriented to person, place, and time. She appears well-developed and well-nourished.  Pulmonary/Chest: Effort normal.  Neurological: She is alert and oriented to person, place, and time.  Skin: Skin is warm. Capillary refill takes less than 2 seconds.  Psychiatric: She has a normal mood and affect. Her behavior is normal. Judgment and thought content normal.  Nursing note and vitals reviewed.   Ortho Exam Lumbar and right hip exam shows positive sciatic tension sign. Painless range of motion of the hip. No focal motor or sensory deficits. Specialty Comments:  No specialty comments available.  Imaging: Xr Hip Unilat W Or W/o Pelvis 1v Right  Result Date: 03/24/2017 No significant degenerative joint disease  Xr Lumbar Spine 2-3 Views  Result Date: 03/24/2017 Advanced degenerative disc disease at multiple levels    PMFS History: Patient Active Problem List   Diagnosis Date Noted  . Acute right-sided low back pain with left-sided sciatica 03/24/2017  . Pain in right hip 03/24/2017  . Impingement syndrome of left  shoulder 08/30/2016  . Impingement syndrome of right shoulder 08/30/2016  . Plantar fasciitis of right foot 08/30/2016  . Pelvic relaxation 04/13/2016    Class: Present on Admission  . S/P hysterectomy 04/12/2016  . Depression 11/16/2015  . Generalized anxiety disorder 11/16/2015  . Convulsion ([place]) 11/16/2015  . SUI (stress urinary incontinence, female) 09/26/2012   Past Medical History:  Diagnosis Date  . Anxiety   . Arthritis    neck and lower back - deg disc disease, fingers  . Depression   . Frequency of urination   . GERD (gastroesophageal reflux disease)   . History of seizure per pt no seizure since    11-07-2015  in setting of stress and sleep deprivation  /   negative work-up by neurologist unknown idiology  . Hypothyroidism   . Injury of thumb, left    03-31-2016    . Pelvic relaxation   . SUI (stress urinary incontinence, female)   . Wears glasses     Family History  Problem Relation Age of Onset  . Colon polyps Mother   . Breast cancer Maternal Aunt   . Stomach cancer Maternal Grandmother   . Diabetes Maternal Grandmother   . Heart disease Maternal Grandmother   . Stomach cancer Maternal Uncle   . Colon cancer Maternal Uncle        x3  . Diabetes Maternal Aunt        Type I  . Diabetes Maternal Uncle        x5 - Type II  . Heart disease Paternal Grandmother   . Esophageal cancer Neg Hx   . Gallbladder disease Neg Hx   . Seizures Neg Hx     Past Surgical History:  Procedure Laterality Date  . ANTERIOR AND POSTERIOR REPAIR  09/25/2012   Procedure: ANTERIOR (CYSTOCELE) AND POSTERIOR REPAIR (RECTOCELE);  Surgeon: Lahoma Crocker, MD;  Location: [facility];  Service: Gynecology;  Laterality: N/A;  . ANTERIOR AND POSTERIOR REPAIR WITH SACROSPINOUS FIXATION N/A 04/12/2016   Procedure: ANTERIOR AND POSTERIOR REPAIR WITH SACROSPINOUS LIGAMENT SUSPENSION, CYSTOSCOPY;  Surgeon: Arvella Nigh, MD;  Location: [facility];  Service: Gynecology;  Laterality: N/A;  . APPENDECTOMY  1987  . BREAST REDUCTION SURGERY  2000  aprrox  . ENDOMETRIAL ABLATION W/ NOVASURE  2011   in office  . EXCISION LEFT BREAST BX  12-07-1999   benign  . LAPAROSCOPIC CHOLECYSTECTOMY  02-09-2005  . LAPAROSCOPIC VAGINAL HYSTERECTOMY WITH SALPINGO OOPHORECTOMY Bilateral 04/12/2016   Procedure: LAPAROSCOPIC ASSISTED VAGINAL HYSTERECTOMY WITH SALPINGO OOPHORECTOMY;  Surgeon: Arvella Nigh, MD;  Location: [facility];  Service: Gynecology;  Laterality: Bilateral;  . ROUX-EN-Y GASTRIC BYPASS  06/  2002  . TUBAL LIGATION  10/ 1990   Social History   Occupational History  . [employer]    Social History Main Topics  . Smoking status: Never  Smoker  . Smokeless tobacco: Never Used  . Alcohol use No     Comment: drinks daily - none since 08/2012 per pt.  . Drug use: No  . Sexual activity: Yes    Birth control/ protection: Surgical

## 2017-03-28 ENCOUNTER — Telehealth (INDEPENDENT_AMBULATORY_CARE_PROVIDER_SITE_OTHER): Payer: Self-pay | Admitting: Orthopaedic Surgery

## 2017-03-28 ENCOUNTER — Other Ambulatory Visit (INDEPENDENT_AMBULATORY_CARE_PROVIDER_SITE_OTHER): Payer: Self-pay | Admitting: Radiology

## 2017-03-28 MED ORDER — HYDROCODONE-ACETAMINOPHEN 5-325 MG PO TABS: 1.0000 | ORAL_TABLET | Freq: Two times a day (BID) | ORAL | 0 refills | Status: DC | PRN

## 2017-03-28 NOTE — Addendum Note (Signed)
Addended by: Michae Kava B on: 03/28/2017 05:04 PM   Modules accepted: Orders

## 2017-03-28 NOTE — Telephone Encounter (Signed)
She can have #20 of norco.  Not oxycodone

## 2017-03-28 NOTE — Telephone Encounter (Signed)
Patient called asking for a refill on oxycodone and also wanted to let Dr. Erlinda Hong know she will be starting PT tomorrow. CB # [phone]

## 2017-03-28 NOTE — Telephone Encounter (Signed)
Please advise 

## 2017-03-29 NOTE — Telephone Encounter (Signed)
LMOM that refill is ready for pick up at front desk.

## 2017-04-11 ENCOUNTER — Telehealth (INDEPENDENT_AMBULATORY_CARE_PROVIDER_SITE_OTHER): Payer: Self-pay | Admitting: Orthopaedic Surgery

## 2017-04-11 NOTE — Telephone Encounter (Signed)
Please advise 

## 2017-04-11 NOTE — Telephone Encounter (Signed)
Tizanidine 2 mg TID prn #30

## 2017-04-11 NOTE — Telephone Encounter (Signed)
Patient called saying that the muscle relaxer sent in to her pharmacy is on back order and was wondering if she could be prescribed something else. Also needing a refill on pain medication. Thank you. CB # [phone]

## 2017-04-12 MED ORDER — TIZANIDINE HCL 2 MG PO TABS: 2.0000 mg | ORAL_TABLET | Freq: Three times a day (TID) | ORAL | 0 refills | Status: DC | PRN

## 2017-04-12 NOTE — Addendum Note (Signed)
Addended by: Precious Bard on: 04/12/2017 04:45 PM   Modules accepted: Orders

## 2017-04-12 NOTE — Telephone Encounter (Signed)
Called pt no answer, [facility] faxed Rx into pharm

## 2017-04-18 ENCOUNTER — Telehealth (INDEPENDENT_AMBULATORY_CARE_PROVIDER_SITE_OTHER): Payer: Self-pay

## 2017-04-18 NOTE — Telephone Encounter (Signed)
Patient would like a Rx refill on Norco.  CB# is [phone].  Please advise.  Thank you.

## 2017-04-19 NOTE — Telephone Encounter (Signed)
Can't do narcotic pain meds chronically

## 2017-04-19 NOTE — Telephone Encounter (Signed)
Please advise 

## 2017-04-20 ENCOUNTER — Other Ambulatory Visit (INDEPENDENT_AMBULATORY_CARE_PROVIDER_SITE_OTHER): Payer: Self-pay | Admitting: Orthopaedic Surgery

## 2017-04-20 ENCOUNTER — Other Ambulatory Visit: Payer: Self-pay | Admitting: Obstetrics and Gynecology

## 2017-04-20 DIAGNOSIS — Z1231 Encounter for screening mammogram for malignant neoplasm of breast: Secondary | ICD-10-CM

## 2017-04-20 NOTE — Telephone Encounter (Signed)
LM for pt. Patient also sent request on mychart. Responded to [employer] as well advising per Dr Erlinda Hong.

## 2017-04-20 NOTE — Telephone Encounter (Signed)
No narcotics.  Thanks.

## 2017-04-22 ENCOUNTER — Ambulatory Visit (INDEPENDENT_AMBULATORY_CARE_PROVIDER_SITE_OTHER): Payer: BLUE CROSS/BLUE SHIELD | Admitting: Orthopaedic Surgery

## 2017-04-26 ENCOUNTER — Ambulatory Visit: Payer: 59

## 2017-04-27 ENCOUNTER — Ambulatory Visit
Admission: RE | Admit: 2017-04-27 | Discharge: 2017-04-27 | Disposition: A | Discharge status: Home / Self Care | Payer: BLUE CROSS/BLUE SHIELD | Source: Ambulatory Visit | Attending: Obstetrics and Gynecology | Admitting: Obstetrics and Gynecology

## 2017-04-27 ENCOUNTER — Telehealth (INDEPENDENT_AMBULATORY_CARE_PROVIDER_SITE_OTHER): Payer: Self-pay | Admitting: Orthopaedic Surgery

## 2017-04-27 DIAGNOSIS — M25511 Pain in right shoulder: Principal | ICD-10-CM

## 2017-04-27 DIAGNOSIS — G8929 Other chronic pain: Secondary | ICD-10-CM

## 2017-04-27 DIAGNOSIS — M25551 Pain in right hip: Secondary | ICD-10-CM

## 2017-04-27 DIAGNOSIS — M25512 Pain in left shoulder: Principal | ICD-10-CM

## 2017-04-27 DIAGNOSIS — Z1231 Encounter for screening mammogram for malignant neoplasm of breast: Secondary | ICD-10-CM

## 2017-04-27 NOTE — Telephone Encounter (Signed)
Pt called she request MRI both shoulders and MRI right hip Before her appt Monday 05/02/17. Informed pt not likely to be able to do in that short time due to needing insurance to authorize and the schedule at imaging may not have availability. Please call pt advise.

## 2017-04-28 NOTE — Telephone Encounter (Signed)
See message below °

## 2017-04-28 NOTE — Telephone Encounter (Signed)
That's fine.  MRI both shoulders and right hip r/o structural abnormalities

## 2017-04-29 NOTE — Telephone Encounter (Signed)
ORDERS ARE IN CX APPT FOR Monday

## 2017-04-29 NOTE — Addendum Note (Signed)
Addended by: Precious Bard on: 04/29/2017 11:50 AM   Modules accepted: Orders

## 2017-05-02 ENCOUNTER — Ambulatory Visit (INDEPENDENT_AMBULATORY_CARE_PROVIDER_SITE_OTHER): Payer: BLUE CROSS/BLUE SHIELD | Admitting: Orthopaedic Surgery

## 2017-05-13 ENCOUNTER — Ambulatory Visit
Admission: RE | Admit: 2017-05-13 | Discharge: 2017-05-13 | Disposition: A | Discharge status: Home / Self Care | Payer: BLUE CROSS/BLUE SHIELD | Source: Ambulatory Visit | Attending: Orthopaedic Surgery | Admitting: Orthopaedic Surgery

## 2017-05-13 DIAGNOSIS — M25551 Pain in right hip: Secondary | ICD-10-CM

## 2017-05-13 DIAGNOSIS — G8929 Other chronic pain: Secondary | ICD-10-CM

## 2017-05-13 DIAGNOSIS — M25511 Pain in right shoulder: Principal | ICD-10-CM

## 2017-05-13 DIAGNOSIS — M25512 Pain in left shoulder: Principal | ICD-10-CM

## 2017-05-15 ENCOUNTER — Ambulatory Visit
Admission: RE | Admit: 2017-05-15 | Discharge: 2017-05-15 | Disposition: A | Discharge status: Home / Self Care | Payer: BLUE CROSS/BLUE SHIELD | Source: Ambulatory Visit | Attending: Orthopaedic Surgery | Admitting: Orthopaedic Surgery

## 2017-05-15 DIAGNOSIS — M25511 Pain in right shoulder: Principal | ICD-10-CM

## 2017-05-15 DIAGNOSIS — M25512 Pain in left shoulder: Principal | ICD-10-CM

## 2017-05-15 DIAGNOSIS — G8929 Other chronic pain: Secondary | ICD-10-CM

## 2017-05-23 ENCOUNTER — Ambulatory Visit (INDEPENDENT_AMBULATORY_CARE_PROVIDER_SITE_OTHER): Payer: BLUE CROSS/BLUE SHIELD | Admitting: Orthopaedic Surgery

## 2017-05-23 ENCOUNTER — Encounter (INDEPENDENT_AMBULATORY_CARE_PROVIDER_SITE_OTHER): Payer: Self-pay | Admitting: Orthopaedic Surgery

## 2017-05-23 DIAGNOSIS — M25512 Pain in left shoulder: Secondary | ICD-10-CM | POA: Diagnosis not present

## 2017-05-23 DIAGNOSIS — M25571 Pain in right ankle and joints of right foot: Secondary | ICD-10-CM | POA: Diagnosis not present

## 2017-05-23 DIAGNOSIS — M7542 Impingement syndrome of left shoulder: Secondary | ICD-10-CM

## 2017-05-23 DIAGNOSIS — M25511 Pain in right shoulder: Secondary | ICD-10-CM | POA: Diagnosis not present

## 2017-05-23 DIAGNOSIS — G8929 Other chronic pain: Secondary | ICD-10-CM

## 2017-05-23 DIAGNOSIS — M7541 Impingement syndrome of right shoulder: Secondary | ICD-10-CM

## 2017-05-23 MED ORDER — TRAMADOL HCL 50 MG PO TABS: 100.0000 mg | ORAL_TABLET | Freq: Four times a day (QID) | ORAL | 0 refills | Status: DC | PRN

## 2017-05-23 NOTE — Progress Notes (Signed)
Office Visit Note   Patient: Tammy [name]           Date of Birth: 1961-09-30           MRN: [id number] Visit Date: 05/23/2017              Requested by: Fanny Bien, [address] [place], [place] [address] PCP: Fanny Bien, MD   Assessment & Plan: Visit Diagnoses:  1. Pain in right ankle and joints of right foot   2. Impingement syndrome of left shoulder   3. Impingement syndrome of right shoulder   4. Chronic pain of both shoulders     Plan: Recommend MRI of the right ankle to rule out structural abnormality given failure of conservative treatment. She continues to have pain. In terms or shoulders and her hip she is doing well and does not require any sort of intervention at this point. Tramadol as prescribed. Follow-up after the MRI. Total face to face encounter time was greater than 25 minutes and over half of this time was spent in counseling and/or coordination of care.  Follow-Up Instructions: Return in about 2 weeks (around 06/06/2017).   Orders:  Orders Placed This Encounter  Procedures  . MR Ankle Right w/o contrast   Meds ordered this encounter  Medications  . traMADol (ULTRAM) 50 MG tablet    Sig: Take 2 tablets (100 mg total) by mouth every 6 (six) hours as needed.    Dispense:  30 tablet    Refill:  0      Procedures: No procedures performed   Clinical Data: No additional findings.   Subjective: Chief Complaint  Patient presents with  . Right Shoulder - Pain, Follow-up  . Left Shoulder - Pain, Follow-up  . Right Hip - Pain, Follow-up    Patient follows up today for review of her bilateral shoulders and right hip MRI. She continues to mainly complaining of right foot and ankle pain. The foot and ankle pain are worse with weightbearing and is constant. This is severe. Denies any numbness and tingling.    Review of Systems  Constitutional: Negative.   HENT: Negative.   Eyes: Negative.   Respiratory: Negative.     Cardiovascular: Negative.   Endocrine: Negative.   Musculoskeletal: Negative.   Neurological: Negative.   Hematological: Negative.   Psychiatric/Behavioral: Negative.   All other systems reviewed and are negative.    Objective: Vital Signs: LMP 09/30/2012   Physical Exam  Constitutional: She is oriented to person, place, and time. She appears well-developed and well-nourished.  HENT:  Head: Normocephalic and atraumatic.  Eyes: EOM are normal.  Neck: Neck supple.  Pulmonary/Chest: Effort normal.  Abdominal: Soft.  Neurological: She is alert and oriented to person, place, and time.  Skin: Skin is warm. Capillary refill takes less than 2 seconds.  Psychiatric: She has a normal mood and affect. Her behavior is normal. Judgment and thought content normal.  Nursing note and vitals reviewed.   Ortho Exam Bilateral shoulder exam showed mild weakness due to pain with rotator cuff testing. Overall very well compensated for range of motion and function. Right hip exam is benign. Right foot and ankle exam shows tenderness along the anterolateral aspect of the right ankle and the sinus Tarsi region. Specialty Comments:  No specialty comments available.  Imaging: No results found.   PMFS History: Patient Active Problem List   Diagnosis Date Noted  . Acute right-sided low back pain with  left-sided sciatica 03/24/2017  . Pain in right hip 03/24/2017  . Impingement syndrome of left shoulder 08/30/2016  . Impingement syndrome of right shoulder 08/30/2016  . Plantar fasciitis of right foot 08/30/2016  . Pelvic relaxation 04/13/2016    Class: Present on Admission  . S/P hysterectomy 04/12/2016  . Depression 11/16/2015  . Generalized anxiety disorder 11/16/2015  . Convulsion ([place]) 11/16/2015  . SUI (stress urinary incontinence, female) 09/26/2012   Past Medical History:  Diagnosis Date  . Anxiety   . Arthritis    neck and lower back - deg disc disease, fingers  . Depression    . Frequency of urination   . GERD (gastroesophageal reflux disease)   . History of seizure per pt no seizure since    11-07-2015  in setting of stress and sleep deprivation  /  negative work-up by neurologist unknown idiology  . Hypothyroidism   . Injury of thumb, left    03-31-2016    . Pelvic relaxation   . SUI (stress urinary incontinence, female)   . Wears glasses     Family History  Problem Relation Age of Onset  . Colon polyps Mother   . Breast cancer Maternal Aunt   . Stomach cancer Maternal Grandmother   . Diabetes Maternal Grandmother   . Heart disease Maternal Grandmother   . Stomach cancer Maternal Uncle   . Colon cancer Maternal Uncle        x3  . Diabetes Maternal Aunt        Type I  . Diabetes Maternal Uncle        x5 - Type II  . Heart disease Paternal Grandmother   . Esophageal cancer Neg Hx   . Gallbladder disease Neg Hx   . Seizures Neg Hx     Past Surgical History:  Procedure Laterality Date  . ANTERIOR AND POSTERIOR REPAIR  09/25/2012   Procedure: ANTERIOR (CYSTOCELE) AND POSTERIOR REPAIR (RECTOCELE);  Surgeon: Lahoma Crocker, MD;  Location: [facility];  Service: Gynecology;  Laterality: N/A;  . ANTERIOR AND POSTERIOR REPAIR WITH SACROSPINOUS FIXATION N/A 04/12/2016   Procedure: ANTERIOR AND POSTERIOR REPAIR WITH SACROSPINOUS LIGAMENT SUSPENSION, CYSTOSCOPY;  Surgeon: Arvella Nigh, MD;  Location: [facility];  Service: Gynecology;  Laterality: N/A;  . APPENDECTOMY  1987  . BREAST EXCISIONAL BIOPSY Left    No scar visable   . BREAST REDUCTION SURGERY  2000  aprrox  . ENDOMETRIAL ABLATION W/ NOVASURE  2011   in office  . EXCISION LEFT BREAST BX  12-07-1999   benign  . LAPAROSCOPIC CHOLECYSTECTOMY  02-09-2005  . LAPAROSCOPIC VAGINAL HYSTERECTOMY WITH SALPINGO OOPHORECTOMY Bilateral 04/12/2016   Procedure: LAPAROSCOPIC ASSISTED VAGINAL HYSTERECTOMY WITH SALPINGO OOPHORECTOMY;  Surgeon: Arvella Nigh, MD;  Location: [facility];   Service: Gynecology;  Laterality: Bilateral;  . REDUCTION MAMMAPLASTY Bilateral   . ROUX-EN-Y GASTRIC BYPASS  06/  2002  . TUBAL LIGATION  10/ 1990   Social History   Occupational History  . [employer]    Social History Main Topics  . Smoking status: Never Smoker  . Smokeless tobacco: Never Used  . Alcohol use No     Comment: drinks daily - none since 08/2012 per pt.  . Drug use: No  . Sexual activity: Yes    Birth control/ protection: Surgical

## 2017-06-10 ENCOUNTER — Ambulatory Visit
Admission: RE | Admit: 2017-06-10 | Discharge: 2017-06-10 | Disposition: A | Discharge status: Home / Self Care | Payer: BLUE CROSS/BLUE SHIELD | Source: Ambulatory Visit | Attending: Orthopaedic Surgery | Admitting: Orthopaedic Surgery

## 2017-06-10 DIAGNOSIS — M25571 Pain in right ankle and joints of right foot: Secondary | ICD-10-CM

## 2017-06-14 ENCOUNTER — Telehealth (INDEPENDENT_AMBULATORY_CARE_PROVIDER_SITE_OTHER): Payer: Self-pay

## 2017-06-14 ENCOUNTER — Ambulatory Visit (INDEPENDENT_AMBULATORY_CARE_PROVIDER_SITE_OTHER): Payer: BLUE CROSS/BLUE SHIELD | Admitting: Orthopaedic Surgery

## 2017-06-14 NOTE — Telephone Encounter (Signed)
See message below °

## 2017-06-14 NOTE — Telephone Encounter (Signed)
Patient would like a phone call about MRI results.  CB# is [phone].  Please advise.  Thank You.

## 2017-06-14 NOTE — Telephone Encounter (Signed)
She has arthritis in her foot.  Mild tendinitis and a partial tear in her plantar fascia.  Recommend advil/aleve as needed.  Ice as needed.

## 2017-06-15 ENCOUNTER — Telehealth (INDEPENDENT_AMBULATORY_CARE_PROVIDER_SITE_OTHER): Payer: Self-pay | Admitting: Orthopaedic Surgery

## 2017-06-15 NOTE — Telephone Encounter (Signed)
Patient called returning your phone call, I told her what Dr. Erlinda Hong said in his message about her MRI but she still has a few questions. CB # [phone]

## 2017-06-15 NOTE — Telephone Encounter (Signed)
I left voicemail for patient to return call for results.

## 2017-06-16 NOTE — Telephone Encounter (Signed)
Please call patient thanks. Has questions for you.

## 2017-06-16 NOTE — Telephone Encounter (Signed)
I tried to reach patient from [facility] office this morning but couldn't. Can you try her this afternoon? I am not sure what her questions are. Thanks.

## 2017-06-17 NOTE — Telephone Encounter (Signed)
I called.

## 2017-06-23 ENCOUNTER — Ambulatory Visit (INDEPENDENT_AMBULATORY_CARE_PROVIDER_SITE_OTHER): Payer: BLUE CROSS/BLUE SHIELD | Admitting: Orthopaedic Surgery

## 2017-06-23 ENCOUNTER — Encounter (INDEPENDENT_AMBULATORY_CARE_PROVIDER_SITE_OTHER): Payer: Self-pay | Admitting: Orthopaedic Surgery

## 2017-06-23 DIAGNOSIS — M629 Disorder of muscle, unspecified: Secondary | ICD-10-CM | POA: Diagnosis not present

## 2017-06-23 DIAGNOSIS — M7061 Trochanteric bursitis, right hip: Secondary | ICD-10-CM | POA: Diagnosis not present

## 2017-06-23 DIAGNOSIS — M7671 Peroneal tendinitis, right leg: Secondary | ICD-10-CM | POA: Diagnosis not present

## 2017-06-23 MED ORDER — BUPIVACAINE HCL 0.5 % IJ SOLN
3.0000 mL | INTRAMUSCULAR | Status: AC | PRN
  Administered 2017-06-23: 3 mL via INTRA_ARTICULAR

## 2017-06-23 MED ORDER — METHYLPREDNISOLONE ACETATE 40 MG/ML IJ SUSP
40.0000 mg | INTRAMUSCULAR | Status: AC | PRN
  Administered 2017-06-23: 40 mg

## 2017-06-23 MED ORDER — TRAMADOL HCL 50 MG PO TABS: 50.0000 mg | ORAL_TABLET | Freq: Four times a day (QID) | ORAL | 2 refills | Status: DC | PRN

## 2017-06-23 MED ORDER — LIDOCAINE HCL 1 % IJ SOLN
3.0000 mL | INTRAMUSCULAR | Status: AC | PRN
  Administered 2017-06-23: 3 mL

## 2017-06-23 MED ORDER — BUPIVACAINE HCL 0.5 % IJ SOLN
1.0000 mL | INTRAMUSCULAR | Status: AC | PRN
  Administered 2017-06-23: 1 mL

## 2017-06-23 MED ORDER — METHYLPREDNISOLONE ACETATE 40 MG/ML IJ SUSP
40.0000 mg | INTRAMUSCULAR | Status: AC | PRN
  Administered 2017-06-23: 40 mg via INTRA_ARTICULAR

## 2017-06-23 MED ORDER — LIDOCAINE HCL 1 % IJ SOLN
1.0000 mL | INTRAMUSCULAR | Status: AC | PRN
  Administered 2017-06-23: 1 mL

## 2017-06-23 NOTE — Progress Notes (Signed)
Office Visit Note   Patient: Tammy [name]           Date of Birth: April 20, 1961           MRN: [id number] Visit Date: 06/23/2017              Requested by: Fanny Bien, [address] [place], [place] [address] PCP: Fanny Bien, MD   Assessment & Plan: Visit Diagnoses:  1. Nontraumatic tear of plantar fascia   2. Peroneal tendinitis, right leg   3. Trochanteric bursitis, right hip     Plan:  Right trochanteric bursa and right plantar fascial injections we performed today. Prescription for tramadol. Patient displays characteristics of chronic pain. We discussed that narcotic pain medicines are not used for treatment of chronic pain.  Follow-Up Instructions: Return if symptoms worsen or fail to improve.   Orders:  Orders Placed This Encounter  Procedures  . Large Joint Injection/Arthrocentesis  . Foot Injection   Meds ordered this encounter  Medications  . traMADol (ULTRAM) 50 MG tablet    Sig: Take 1 tablet (50 mg total) by mouth every 6 (six) hours as needed.    Dispense:  60 tablet    Refill:  2      Procedures: Large Joint Inj Date/Time: 06/23/2017 11:20 AM Performed by: Leandrew Koyanagi Authorized by: Leandrew Koyanagi   Consent Given by:  Patient Timeout: prior to procedure the correct patient, procedure, and site was verified   Indications:  Pain Location:  Hip Site:  R greater trochanter Prep: patient was prepped and draped in usual sterile fashion   Needle Size:  22 G Approach:  Lateral Ultrasound Guidance: No   Fluoroscopic Guidance: No   Arthrogram: No   Medications:  3 mL lidocaine 1 %; 3 mL bupivacaine 0.5 %; 40 mg methylPREDNISolone acetate 40 MG/ML Foot Inj Date/Time: 06/23/2017 11:21 AM Performed by: Leandrew Koyanagi Authorized by: Leandrew Koyanagi   Indications:  Fasciitis Condition: Plantar Fasciitis   Location: right plantar fascia muscle   Prep: patient was prepped and draped in usual sterile fashion   Needle Size:  25  G Medications:  1 mL bupivacaine 0.5 %; 1 mL lidocaine 1 %; 40 mg methylPREDNISolone acetate 40 MG/ML     Clinical Data: No additional findings.   Subjective: Chief Complaint  Patient presents with  . Right Ankle - Pain, Follow-up     Patient follows up today for her MRI. She continues to have right hip pain and right ankle and foot pain. She also complains of chronic pain    Review of Systems  Constitutional: Negative.   HENT: Negative.   Eyes: Negative.   Respiratory: Negative.   Cardiovascular: Negative.   Endocrine: Negative.   Musculoskeletal: Negative.   Neurological: Negative.   Hematological: Negative.   Psychiatric/Behavioral: Negative.   All other systems reviewed and are negative.    Objective: Vital Signs: LMP 09/30/2012   Physical Exam  Constitutional: She is oriented to person, place, and time. She appears well-developed and well-nourished.  HENT:  Head: Normocephalic and atraumatic.  Eyes: EOM are normal.  Neck: Neck supple.  Pulmonary/Chest: Effort normal.  Abdominal: Soft.  Neurological: She is alert and oriented to person, place, and time.  Skin: Skin is warm. Capillary refill takes less than 2 seconds.  Psychiatric: She has a normal mood and affect. Her behavior is normal. Judgment and thought content normal.  Nursing note and vitals reviewed.  Ortho Exam  right hip exam shows tenderness over the lateral trochanteric bursa.   right foot exam shows tenderness along the peroneal tendons. These do not sublux.   Plantar fascial insertion is tender to palpation. Specialty Comments:  No specialty comments available.  Imaging: No results found.   PMFS History: Patient Active Problem List   Diagnosis Date Noted  . Peroneal tendinitis, right leg 06/23/2017  . Trochanteric bursitis, right hip 06/23/2017  . Acute right-sided low back pain with left-sided sciatica 03/24/2017  . Pain in right hip 03/24/2017  . Impingement syndrome of left  shoulder 08/30/2016  . Impingement syndrome of right shoulder 08/30/2016  . Plantar fasciitis of right foot 08/30/2016  . Pelvic relaxation 04/13/2016    Class: Present on Admission  . S/P hysterectomy 04/12/2016  . Depression 11/16/2015  . Generalized anxiety disorder 11/16/2015  . Convulsion ([place]) 11/16/2015  . SUI (stress urinary incontinence, female) 09/26/2012   Past Medical History:  Diagnosis Date  . Anxiety   . Arthritis    neck and lower back - deg disc disease, fingers  . Depression   . Frequency of urination   . GERD (gastroesophageal reflux disease)   . History of seizure per pt no seizure since    11-07-2015  in setting of stress and sleep deprivation  /  negative work-up by neurologist unknown idiology  . Hypothyroidism   . Injury of thumb, left    03-31-2016    . Pelvic relaxation   . SUI (stress urinary incontinence, female)   . Wears glasses     Family History  Problem Relation Age of Onset  . Colon polyps Mother   . Breast cancer Maternal Aunt   . Stomach cancer Maternal Grandmother   . Diabetes Maternal Grandmother   . Heart disease Maternal Grandmother   . Stomach cancer Maternal Uncle   . Colon cancer Maternal Uncle        x3  . Diabetes Maternal Aunt        Type I  . Diabetes Maternal Uncle        x5 - Type II  . Heart disease Paternal Grandmother   . Esophageal cancer Neg Hx   . Gallbladder disease Neg Hx   . Seizures Neg Hx     Past Surgical History:  Procedure Laterality Date  . ANTERIOR AND POSTERIOR REPAIR  09/25/2012   Procedure: ANTERIOR (CYSTOCELE) AND POSTERIOR REPAIR (RECTOCELE);  Surgeon: Lahoma Crocker, MD;  Location: [facility];  Service: Gynecology;  Laterality: N/A;  . ANTERIOR AND POSTERIOR REPAIR WITH SACROSPINOUS FIXATION N/A 04/12/2016   Procedure: ANTERIOR AND POSTERIOR REPAIR WITH SACROSPINOUS LIGAMENT SUSPENSION, CYSTOSCOPY;  Surgeon: Arvella Nigh, MD;  Location: [facility];  Service: Gynecology;   Laterality: N/A;  . APPENDECTOMY  1987  . BREAST EXCISIONAL BIOPSY Left    No scar visable   . BREAST REDUCTION SURGERY  2000  aprrox  . ENDOMETRIAL ABLATION W/ NOVASURE  2011   in office  . EXCISION LEFT BREAST BX  12-07-1999   benign  . LAPAROSCOPIC CHOLECYSTECTOMY  02-09-2005  . LAPAROSCOPIC VAGINAL HYSTERECTOMY WITH SALPINGO OOPHORECTOMY Bilateral 04/12/2016   Procedure: LAPAROSCOPIC ASSISTED VAGINAL HYSTERECTOMY WITH SALPINGO OOPHORECTOMY;  Surgeon: Arvella Nigh, MD;  [facility];  Service: Gynecology;  Laterality: Bilateral;  . REDUCTION MAMMAPLASTY Bilateral   . ROUX-EN-Y GASTRIC BYPASS  06/  2002  . TUBAL LIGATION  10/ 1990   Social History   Occupational History  . [employer]  Social History Main Topics  . Smoking status: Never Smoker  . Smokeless tobacco: Never Used  . Alcohol use No     Comment: drinks daily - none since 08/2012 per pt.  . Drug use: No  . Sexual activity: Yes    Birth control/ protection: Surgical

## 2018-02-17 ENCOUNTER — Other Ambulatory Visit: Payer: Self-pay | Admitting: Family Medicine

## 2018-02-20 ENCOUNTER — Encounter (INDEPENDENT_AMBULATORY_CARE_PROVIDER_SITE_OTHER): Payer: Self-pay

## 2018-02-22 ENCOUNTER — Other Ambulatory Visit: Payer: Self-pay | Admitting: Family Medicine

## 2018-02-22 DIAGNOSIS — R531 Weakness: Secondary | ICD-10-CM

## 2018-02-22 DIAGNOSIS — G8324 Monoplegia of upper limb affecting left nondominant side: Secondary | ICD-10-CM

## 2018-02-22 DIAGNOSIS — R202 Paresthesia of skin: Secondary | ICD-10-CM

## 2018-03-31 ENCOUNTER — Other Ambulatory Visit: Payer: BLUE CROSS/BLUE SHIELD

## 2018-04-25 ENCOUNTER — Encounter

## 2018-04-25 ENCOUNTER — Ambulatory Visit: Payer: BLUE CROSS/BLUE SHIELD | Admitting: Neurology

## 2018-05-23 ENCOUNTER — Telehealth (INDEPENDENT_AMBULATORY_CARE_PROVIDER_SITE_OTHER): Payer: Self-pay | Admitting: Orthopaedic Surgery

## 2018-05-23 NOTE — Telephone Encounter (Signed)
Returned call to patient (call dropped) x2   [phone]

## 2018-06-20 ENCOUNTER — Ambulatory Visit (INDEPENDENT_AMBULATORY_CARE_PROVIDER_SITE_OTHER): Payer: Self-pay

## 2018-06-20 ENCOUNTER — Ambulatory Visit (INDEPENDENT_AMBULATORY_CARE_PROVIDER_SITE_OTHER): Payer: BLUE CROSS/BLUE SHIELD | Admitting: Orthopaedic Surgery

## 2018-06-20 ENCOUNTER — Encounter (INDEPENDENT_AMBULATORY_CARE_PROVIDER_SITE_OTHER): Payer: Self-pay | Admitting: Orthopaedic Surgery

## 2018-06-20 DIAGNOSIS — S63502A Unspecified sprain of left wrist, initial encounter: Secondary | ICD-10-CM | POA: Diagnosis not present

## 2018-06-20 DIAGNOSIS — S52125A Nondisplaced fracture of head of left radius, initial encounter for closed fracture: Secondary | ICD-10-CM | POA: Diagnosis not present

## 2018-06-20 MED ORDER — HYDROCODONE-ACETAMINOPHEN 5-325 MG PO TABS: 1.0000 | ORAL_TABLET | Freq: Two times a day (BID) | ORAL | 0 refills | Status: DC | PRN

## 2018-06-20 NOTE — Progress Notes (Signed)
Office Visit Note   Patient: Tammy [name]           Date of Birth: 1961/02/12           MRN: [id number] Visit Date: 06/20/2018              Requested by: Fanny Bien, [address] [place], [place] [address] PCP: Fanny Bien, MD   Assessment & Plan: Visit Diagnoses:  1. Closed nondisplaced fracture of head of left radius, initial encounter   2. Sprain of left wrist, initial encounter     Plan: Impression is left nondisplaced radial head fracture and left wrist sprain.  We will place the patient in a long-arm splint and sling for comfort for the next 10 days.  She will follow-up with [place] at that point for repeat evaluation and x-rays of her elbow and wrist.  Call with concerns or questions in the meantime.  Follow-Up Instructions: Return in about 10 days (around 06/30/2018).   Orders:  Orders Placed This Encounter  Procedures  . XR Forearm Left  . XR Elbow Complete Left (3+View)  . XR Wrist Complete Left   Meds ordered this encounter  Medications  . HYDROcodone-acetaminophen (NORCO) 5-325 MG tablet    Sig: Take 1 tablet by mouth 2 (two) times daily as needed for moderate pain.    Dispense:  10 tablet    Refill:  0      Procedures: No procedures performed   Clinical Data: No additional findings.   Subjective: Chief Complaint  Patient presents with  . Left Elbow - Pain    HPI patient is a pleasant 57 year old female who presents to our clinic today with left wrist and elbow pain.  This past Sunday, she got caught up in ivy while in her natural area which caused her to fall landing on her left wrist and elbow.  Since then she has had moderate pain to the wrist and elbow.  Pain is worse with pronation of the wrist and any movement of the elbow.  She has tried over-the-counter medications without relief of symptoms.  No numbness, tingling or burning.  Review of Systems as detailed in HPI.  All others reviewed and are negative.  Objective: Vital  Signs: LMP 09/30/2012   Physical Exam well-developed and well-nourished female in no acute distress.  Alert and oriented x3.  Ortho Exam examination of the left elbow reveals marked tenderness over the radial head.  Diffuse tenderness to the distal radius.  Limited range of motion of the elbow and wrist secondary to pain.  Specialty Comments:  No specialty comments available.  Imaging: Xr Elbow Complete Left (3+view)  Result Date: 06/20/2018 X-rays demonstrate a nondisplaced radial head fracture  Xr Wrist Complete Left  Result Date: 06/20/2018 No acute structural abnormalities  Xr Forearm Left  Result Date: 06/20/2018 Imaging reveals nondisplaced fracture to the radial head    PMFS History: Patient Active Problem List   Diagnosis Date Noted  . Closed nondisplaced fracture of head of left radius 06/20/2018  . Sprain of left wrist 06/20/2018  . Peroneal tendinitis, right leg 06/23/2017  . Trochanteric bursitis, right hip 06/23/2017  . Acute right-sided low back pain with left-sided sciatica 03/24/2017  . Pain in right hip 03/24/2017  . Impingement syndrome of left shoulder 08/30/2016  . Impingement syndrome of right shoulder 08/30/2016  . Plantar fasciitis of right foot 08/30/2016  . Pelvic relaxation 04/13/2016    Class: Present on Admission  .  S/P hysterectomy 04/12/2016  . Depression 11/16/2015  . Generalized anxiety disorder 11/16/2015  . Convulsion ([place]) 11/16/2015  . SUI (stress urinary incontinence, female) 09/26/2012   Past Medical History:  Diagnosis Date  . Anxiety   . Arthritis    neck and lower back - deg disc disease, fingers  . Depression   . Frequency of urination   . GERD (gastroesophageal reflux disease)   . History of seizure per pt no seizure since    11-07-2015  in setting of stress and sleep deprivation  /  negative work-up by neurologist unknown idiology  . Hypothyroidism   . Injury of thumb, left    03-31-2016    . Pelvic relaxation   .  SUI (stress urinary incontinence, female)   . Wears glasses     Family History  Problem Relation Age of Onset  . Colon polyps Mother   . Breast cancer Maternal Aunt   . Stomach cancer Maternal Grandmother   . Diabetes Maternal Grandmother   . Heart disease Maternal Grandmother   . Stomach cancer Maternal Uncle   . Colon cancer Maternal Uncle        x3  . Diabetes Maternal Aunt        Type I  . Diabetes Maternal Uncle        x5 - Type II  . Heart disease Paternal Grandmother   . Esophageal cancer Neg Hx   . Gallbladder disease Neg Hx   . Seizures Neg Hx     Past Surgical History:  Procedure Laterality Date  . ANTERIOR AND POSTERIOR REPAIR  09/25/2012   Procedure: ANTERIOR (CYSTOCELE) AND POSTERIOR REPAIR (RECTOCELE);  Surgeon: Lahoma Crocker, MD;  Location: [facility];  Service: Gynecology;  Laterality: N/A;  . ANTERIOR AND POSTERIOR REPAIR WITH SACROSPINOUS FIXATION N/A 04/12/2016   Procedure: ANTERIOR AND POSTERIOR REPAIR WITH SACROSPINOUS LIGAMENT SUSPENSION, CYSTOSCOPY;  Surgeon: Arvella Nigh, MD;  Location: [facility];  Service: Gynecology;  Laterality: N/A;  . APPENDECTOMY  1987  . BREAST EXCISIONAL BIOPSY Left    No scar visable   . BREAST REDUCTION SURGERY  2000  aprrox  . ENDOMETRIAL ABLATION W/ NOVASURE  2011   in office  . EXCISION LEFT BREAST BX  12-07-1999   benign  . LAPAROSCOPIC CHOLECYSTECTOMY  02-09-2005  . LAPAROSCOPIC VAGINAL HYSTERECTOMY WITH SALPINGO OOPHORECTOMY Bilateral 04/12/2016   Procedure: LAPAROSCOPIC ASSISTED VAGINAL HYSTERECTOMY WITH SALPINGO OOPHORECTOMY;  Surgeon: Arvella Nigh, MD;  Location: [facility];  Service: Gynecology;  Laterality: Bilateral;  . REDUCTION MAMMAPLASTY Bilateral   . ROUX-EN-Y GASTRIC BYPASS  06/  2002  . TUBAL LIGATION  10/ 1990   Social History   Occupational History  . Occupation: [employer]  Tobacco Use  . Smoking status: Never Smoker  . Smokeless tobacco: Never Used  Substance and  Sexual Activity  . Alcohol use: No    Comment: drinks daily - none since 08/2012 per pt.  . Drug use: No  . Sexual activity: Yes    Birth control/protection: Surgical

## 2018-06-21 ENCOUNTER — Ambulatory Visit (INDEPENDENT_AMBULATORY_CARE_PROVIDER_SITE_OTHER): Payer: BLUE CROSS/BLUE SHIELD | Admitting: Orthopaedic Surgery

## 2018-06-30 ENCOUNTER — Ambulatory Visit (INDEPENDENT_AMBULATORY_CARE_PROVIDER_SITE_OTHER): Payer: BLUE CROSS/BLUE SHIELD | Admitting: Orthopaedic Surgery

## 2018-06-30 ENCOUNTER — Encounter (INDEPENDENT_AMBULATORY_CARE_PROVIDER_SITE_OTHER): Payer: Self-pay | Admitting: Orthopaedic Surgery

## 2018-06-30 ENCOUNTER — Ambulatory Visit (INDEPENDENT_AMBULATORY_CARE_PROVIDER_SITE_OTHER): Payer: BLUE CROSS/BLUE SHIELD

## 2018-06-30 DIAGNOSIS — M25522 Pain in left elbow: Secondary | ICD-10-CM

## 2018-06-30 DIAGNOSIS — S52125A Nondisplaced fracture of head of left radius, initial encounter for closed fracture: Secondary | ICD-10-CM

## 2018-06-30 DIAGNOSIS — M722 Plantar fascial fibromatosis: Secondary | ICD-10-CM

## 2018-06-30 MED ORDER — HYDROCODONE-ACETAMINOPHEN 5-325 MG PO TABS: 1.0000 | ORAL_TABLET | Freq: Three times a day (TID) | ORAL | 0 refills | Status: DC | PRN

## 2018-06-30 MED ORDER — METHYLPREDNISOLONE ACETATE 40 MG/ML IJ SUSP
13.3300 mg | INTRAMUSCULAR | Status: AC | PRN
  Administered 2018-06-30: 13.33 mg

## 2018-06-30 MED ORDER — LIDOCAINE HCL 1 % IJ SOLN
0.3300 mL | INTRAMUSCULAR | Status: AC | PRN
  Administered 2018-06-30: .33 mL

## 2018-06-30 MED ORDER — BUPIVACAINE HCL 0.25 % IJ SOLN
0.3300 mL | INTRAMUSCULAR | Status: AC | PRN
  Administered 2018-06-30: .33 mL

## 2018-06-30 NOTE — Progress Notes (Signed)
Office Visit Note   Patient: Tammy [name]           Date of Birth: 05-Jan-1961           MRN: [id number] Visit Date: 06/30/2018              Requested by: Fanny Bien, [address] [place], [place] [address] PCP: Fanny Bien, MD   Assessment & Plan: Visit Diagnoses:  1. Closed nondisplaced fracture of head of left radius, initial encounter   2. Pain in left elbow   3. Plantar fasciitis, bilateral     Plan: Impression is left radial head fracture.  #2 bilateral plantar fasciitis.  In regards to the radial head fracture, we will transition the patient into a sling.  We will start her in formal physical therapy to work on gentle range of motion as tolerated.  Follow-up with [place] in 4 weeks time for recheck.  In regards to the plantar fasciitis, we will inject both heels today.  I will provide her with a new stretching handout for this.  I have encouraged her to wear shoes with good support.  Follow-Up Instructions: Return in about 4 weeks (around 07/28/2018).   Orders:  Orders Placed This Encounter  Procedures  . Foot Inj  . XR Elbow Complete Left (3+View)  . XR Wrist Complete Left   Meds ordered this encounter  Medications  . HYDROcodone-acetaminophen (NORCO) 5-325 MG tablet    Sig: Take 1 tablet by mouth 3 (three) times daily as needed for moderate pain.    Dispense:  15 tablet    Refill:  0      Procedures: Foot Inj Date/Time: 06/30/2018 10:43 AM Performed by: Aundra Dubin, PA-C Authorized by: Aundra Dubin, PA-C   Condition: Plantar Fasciitis   Location: left plantar fascia muscle and right plantar fascia muscle   Medications:  0.33 mL lidocaine 1 %; 0.33 mL bupivacaine 0.25 %; 13.33 mg methylPREDNISolone acetate 40 MG/ML     Clinical Data: No additional findings.   Subjective: Chief Complaint  Patient presents with  . Left Elbow - Pain  . Left Foot - Pain  . Right Foot - Pain    HPI patient is a pleasant 57 year old female  presents to our clinic today 12 days status post left elbow radial head fracture.  She has been compliant in her long-arm splint.  She still admits to moderate amount of pain.  Elevating for swelling.  Other issue she brings up today is pain to both feet.  Primarily in the heels.  The pain she has is worse first thing in the morning as well as the end of the day.  Nothing seems to make this better.  She does note that she has tried gastroc stretches in the past.  History of right plantar fascial rupture over a year ago.  She has had both heels injected with cortisone which significantly helped until recently.  She would like repeat injections today.  Review of Systems as detailed in HPI.  All others reviewed and are negative.   Objective: Vital Signs: LMP 09/30/2012   Physical Exam well-developed well-nourished female in no acute distress.  Alert and oriented x3.  Ortho Exam examination of the left elbow reveals marked tenderness over the radial head.  Limited range of motion secondary to pain.  Examination of both heels reveal moderate tenderness palpation.  Dorsiflexion to neutral.  Specialty Comments:  No specialty comments available.  Imaging:  Xr Elbow Complete Left (3+view)  Result Date: 06/30/2018 X-rays of the left elbow demonstrate mild healing of the radial head fracture with acceptable alignment  Xr Wrist Complete Left  Result Date: 06/30/2018 No acute or structural abnormalities    PMFS History: Patient Active Problem List   Diagnosis Date Noted  . Closed nondisplaced fracture of head of left radius 06/20/2018  . Sprain of left wrist 06/20/2018  . Peroneal tendinitis, right leg 06/23/2017  . Trochanteric bursitis, right hip 06/23/2017  . Acute right-sided low back pain with left-sided sciatica 03/24/2017  . Pain in right hip 03/24/2017  . Impingement syndrome of left shoulder 08/30/2016  . Impingement syndrome of right shoulder 08/30/2016  . Plantar fasciitis,  bilateral 08/30/2016  . Pelvic relaxation 04/13/2016    Class: Present on Admission  . S/P hysterectomy 04/12/2016  . Depression 11/16/2015  . Generalized anxiety disorder 11/16/2015  . Convulsion ([place]) 11/16/2015  . SUI (stress urinary incontinence, female) 09/26/2012   Past Medical History:  Diagnosis Date  . Anxiety   . Arthritis    neck and lower back - deg disc disease, fingers  . Depression   . Frequency of urination   . GERD (gastroesophageal reflux disease)   . History of seizure per pt no seizure since    11-07-2015  in setting of stress and sleep deprivation  /  negative work-up by neurologist unknown idiology  . Hypothyroidism   . Injury of thumb, left    03-31-2016    . Pelvic relaxation   . SUI (stress urinary incontinence, female)   . Wears glasses     Family History  Problem Relation Age of Onset  . Colon polyps Mother   . Breast cancer Maternal Aunt   . Stomach cancer Maternal Grandmother   . Diabetes Maternal Grandmother   . Heart disease Maternal Grandmother   . Stomach cancer Maternal Uncle   . Colon cancer Maternal Uncle        x3  . Diabetes Maternal Aunt        Type I  . Diabetes Maternal Uncle        x5 - Type II  . Heart disease Paternal Grandmother   . Esophageal cancer Neg Hx   . Gallbladder disease Neg Hx   . Seizures Neg Hx     Past Surgical History:  Procedure Laterality Date  . ANTERIOR AND POSTERIOR REPAIR  09/25/2012   Procedure: ANTERIOR (CYSTOCELE) AND POSTERIOR REPAIR (RECTOCELE);  Surgeon: Lahoma Crocker, MD;  Location: [facility];  Service: Gynecology;  Laterality: N/A;  . ANTERIOR AND POSTERIOR REPAIR WITH SACROSPINOUS FIXATION N/A 04/12/2016   Procedure: ANTERIOR AND POSTERIOR REPAIR WITH SACROSPINOUS LIGAMENT SUSPENSION, CYSTOSCOPY;  Surgeon: Arvella Nigh, MD;  Location: [facility];  Service: Gynecology;  Laterality: N/A;  . APPENDECTOMY  1987  . BREAST EXCISIONAL BIOPSY Left    No scar visable   . BREAST  REDUCTION SURGERY  2000  aprrox  . ENDOMETRIAL ABLATION W/ NOVASURE  2011   in office  . EXCISION LEFT BREAST BX  12-07-1999   benign  . LAPAROSCOPIC CHOLECYSTECTOMY  02-09-2005  . LAPAROSCOPIC VAGINAL HYSTERECTOMY WITH SALPINGO OOPHORECTOMY Bilateral 04/12/2016   Procedure: LAPAROSCOPIC ASSISTED VAGINAL HYSTERECTOMY WITH SALPINGO OOPHORECTOMY;  Surgeon: Arvella Nigh, MD;  Location: [facility];  Service: Gynecology;  Laterality: Bilateral;  . REDUCTION MAMMAPLASTY Bilateral   . ROUX-EN-Y GASTRIC BYPASS  06/  2002  . TUBAL LIGATION  10/ 1990   Social History  Occupational History  . Occupation: [employer]  Tobacco Use  . Smoking status: Never Smoker  . Smokeless tobacco: Never Used  Substance and Sexual Activity  . Alcohol use: No    Comment: drinks daily - none since 08/2012 per pt.  . Drug use: No  . Sexual activity: Yes    Birth control/protection: Surgical

## 2018-07-13 ENCOUNTER — Telehealth (INDEPENDENT_AMBULATORY_CARE_PROVIDER_SITE_OTHER): Payer: Self-pay | Admitting: Orthopaedic Surgery

## 2018-07-13 NOTE — Telephone Encounter (Signed)
See message below °

## 2018-07-13 NOTE — Telephone Encounter (Signed)
Up to you

## 2018-07-13 NOTE — Telephone Encounter (Signed)
Patient called requesting refill on HYDROcodone  Please call patient to advise [phone]

## 2018-07-14 ENCOUNTER — Telehealth (INDEPENDENT_AMBULATORY_CARE_PROVIDER_SITE_OTHER): Payer: Self-pay

## 2018-07-14 NOTE — Telephone Encounter (Signed)
Patient requests refill for pain medicine

## 2018-07-14 NOTE — Telephone Encounter (Signed)
Tramadol #30.  1 tab bid prn

## 2018-07-14 NOTE — Telephone Encounter (Signed)
Please advise 

## 2018-07-17 ENCOUNTER — Other Ambulatory Visit (INDEPENDENT_AMBULATORY_CARE_PROVIDER_SITE_OTHER): Payer: Self-pay

## 2018-07-17 MED ORDER — TRAMADOL HCL 50 MG PO TABS: ORAL_TABLET | ORAL | 0 refills | Status: DC

## 2018-07-17 NOTE — Telephone Encounter (Signed)
Have to wean to tramadol at this point. Can write 50 mg 1-2 tid prn pain, #30

## 2018-07-17 NOTE — Telephone Encounter (Signed)
Called Rx into pharm. [facility]

## 2018-07-17 NOTE — Telephone Encounter (Signed)
Called into pharm  

## 2018-07-28 ENCOUNTER — Ambulatory Visit (INDEPENDENT_AMBULATORY_CARE_PROVIDER_SITE_OTHER): Payer: BLUE CROSS/BLUE SHIELD | Admitting: Orthopaedic Surgery

## 2018-08-15 ENCOUNTER — Encounter (HOSPITAL_COMMUNITY): Payer: Self-pay | Admitting: Internal Medicine

## 2018-08-15 ENCOUNTER — Emergency Department (HOSPITAL_COMMUNITY)
Admission: EM | Admit: 2018-08-15 | Discharge: 2018-08-15 | Disposition: A | Discharge status: Home / Self Care | Payer: BLUE CROSS/BLUE SHIELD | Attending: Emergency Medicine | Admitting: Emergency Medicine

## 2018-08-15 ENCOUNTER — Emergency Department (HOSPITAL_COMMUNITY): Payer: BLUE CROSS/BLUE SHIELD

## 2018-08-15 DIAGNOSIS — E039 Hypothyroidism, unspecified: Secondary | ICD-10-CM | POA: Insufficient documentation

## 2018-08-15 DIAGNOSIS — R42 Dizziness and giddiness: Secondary | ICD-10-CM | POA: Insufficient documentation

## 2018-08-15 DIAGNOSIS — Z91040 Latex allergy status: Secondary | ICD-10-CM | POA: Insufficient documentation

## 2018-08-15 DIAGNOSIS — Z79899 Other long term (current) drug therapy: Secondary | ICD-10-CM | POA: Diagnosis not present

## 2018-08-15 LAB — CBC WITH DIFFERENTIAL/PLATELET
Abs Immature Granulocytes: 0.02 10*3/uL (ref 0.00–0.07)
Basophils Absolute: 0.1 10*3/uL (ref 0.0–0.1)
Basophils Relative: 1 %
Eosinophils Absolute: 0.1 10*3/uL (ref 0.0–0.5)
Eosinophils Relative: 1 %
HCT: 39.7 % (ref 36.0–46.0)
Hemoglobin: 12.1 g/dL (ref 12.0–15.0)
Immature Granulocytes: 0 %
Lymphocytes Relative: 23 %
Lymphs Abs: 1.9 10*3/uL (ref 0.7–4.0)
MCH: 26.9 pg (ref 26.0–34.0)
MCHC: 30.5 g/dL (ref 30.0–36.0)
MCV: 88.2 fL (ref 80.0–100.0)
Monocytes Absolute: 0.4 10*3/uL (ref 0.1–1.0)
Monocytes Relative: 5 %
Neutro Abs: 5.7 10*3/uL (ref 1.7–7.7)
Neutrophils Relative %: 70 %
Platelets: 388 10*3/uL (ref 150–400)
RBC: 4.5 MIL/uL (ref 3.87–5.11)
RDW: 15.3 % (ref 11.5–15.5)
WBC: 8.2 10*3/uL (ref 4.0–10.5)
nRBC: 0 % (ref 0.0–0.2)

## 2018-08-15 LAB — BASIC METABOLIC PANEL
Anion gap: 5 (ref 5–15)
BUN: <5 mg/dL — ABNORMAL LOW (ref 6–20)
CO2: 26 mmol/L (ref 22–32)
Calcium: 9.1 mg/dL (ref 8.9–10.3)
Chloride: 109 mmol/L (ref 98–111)
Creatinine, Ser: 0.8 mg/dL (ref 0.44–1.00)
GFR calc Af Amer: >60 mL/min (ref >60)
GFR calc non Af Amer: >60 mL/min (ref >60)
Glucose, Bld: 90 mg/dL (ref 70–99)
Potassium: 3.7 mmol/L (ref 3.5–5.1)
Sodium: 140 mmol/L (ref 135–145)

## 2018-08-15 MED ORDER — DIAZEPAM 5 MG PO TABS
5.0000 mg | ORAL_TABLET | Freq: Once | ORAL | Status: AC
  Administered 2018-08-15: 5 mg via ORAL
  Filled 2018-08-15: qty 1

## 2018-08-15 MED ORDER — MECLIZINE HCL 25 MG PO TABS
25.0000 mg | ORAL_TABLET | Freq: Once | ORAL | Status: AC
  Administered 2018-08-15: 25 mg via ORAL
  Filled 2018-08-15: qty 1

## 2018-08-15 MED ORDER — MECLIZINE HCL 25 MG PO TABS: 25.0000 mg | ORAL_TABLET | Freq: Three times a day (TID) | ORAL | 0 refills | Status: DC | PRN

## 2018-08-15 NOTE — Discharge Instructions (Signed)
Start taking meclizine up to 3 times daily as needed for dizziness.  This medicine may make you feel sleepy so do not drive, drink alcohol, or operate heavy machinery while taking this medicine.  Drink plenty of fluids and get plenty of rest.  Follow-up with your primary care physician for reevaluation of your dizziness.  They may refer you to physical therapy.  Return to the emergency department if any concerning signs or symptoms develop such as persistent dizziness, persistent vomiting, chest pain, shortness of breath, or high fevers.

## 2018-08-15 NOTE — ED Notes (Signed)
Patient transported to CT 

## 2018-08-15 NOTE — ED Triage Notes (Signed)
Pt woke up with dizziness yesterday and called her PCP to make an appointment. PCP instructed patient to come to ED for evaluation, Pt reports difficulty ambulating and dizziness when sitting still. Denies HA, nausea, chest pain, and shortness of breath.

## 2018-08-15 NOTE — ED Notes (Signed)
Patient ambulatory to bathroom with steady gait at this time. Reports improvement in dizziness.

## 2018-08-15 NOTE — ED Provider Notes (Signed)
[facility] EMERGENCY DEPARTMENT Provider Note   CSN: [id number] Arrival date & time: 08/15/18  1531     History   Chief Complaint Chief Complaint  Patient presents with  . Dizziness    HPI Tammy [name] is a 57 y.o. female with history of anxiety, arthritis, depression, GERD, seizures, hypothyroidism presents for evaluation of acute onset, persistent dizziness.  She states that symptoms began yesterday morning when she awoke.  She states the symptoms are essentially constant but worsened with rapid movement of the head and with attempts to ambulate.  She notes a mild dull right-sided headache which does not radiate and is similar to headaches she has had in the past.  He denies vision changes, nausea, vomiting, abdominal pain, chest pain, shortness of breath, numbness, tingling, or weakness.  She has not tried anything for her symptoms.  Did call her PCP who recommended presentation to the ED for further evaluation.  No family history of stroke at a young age.  The history is provided by the patient.    Past Medical History:  Diagnosis Date  . Anxiety   . Arthritis    neck and lower back - deg disc disease, fingers  . Depression   . Frequency of urination   . GERD (gastroesophageal reflux disease)   . History of seizure per pt no seizure since    11-07-2015  in setting of stress and sleep deprivation  /  negative work-up by neurologist unknown idiology  . Hypothyroidism   . Injury of thumb, left    03-31-2016    . Pelvic relaxation   . SUI (stress urinary incontinence, female)   . Wears glasses     Patient Active Problem List   Diagnosis Date Noted  . Closed nondisplaced fracture of head of left radius 06/20/2018  . Sprain of left wrist 06/20/2018  . Peroneal tendinitis, right leg 06/23/2017  . Trochanteric bursitis, right hip 06/23/2017  . Acute right-sided low back pain with left-sided sciatica 03/24/2017  . Pain in right hip 03/24/2017  .  Impingement syndrome of left shoulder 08/30/2016  . Impingement syndrome of right shoulder 08/30/2016  . Plantar fasciitis, bilateral 08/30/2016  . Pelvic relaxation 04/13/2016    Class: Present on Admission  . S/P hysterectomy 04/12/2016  . Depression 11/16/2015  . Generalized anxiety disorder 11/16/2015  . Convulsion ([place]) 11/16/2015  . SUI (stress urinary incontinence, female) 09/26/2012    Past Surgical History:  Procedure Laterality Date  . ANTERIOR AND POSTERIOR REPAIR  09/25/2012   Procedure: ANTERIOR (CYSTOCELE) AND POSTERIOR REPAIR (RECTOCELE);  Surgeon: Lahoma Crocker, MD;  Location: [facility];  Service: Gynecology;  Laterality: N/A;  . ANTERIOR AND POSTERIOR REPAIR WITH SACROSPINOUS FIXATION N/A 04/12/2016   Procedure: ANTERIOR AND POSTERIOR REPAIR WITH SACROSPINOUS LIGAMENT SUSPENSION, CYSTOSCOPY;  Surgeon: Arvella Nigh, MD;  Location: [facility];  Service: Gynecology;  Laterality: N/A;  . APPENDECTOMY  1987  . BREAST EXCISIONAL BIOPSY Left    No scar visable   . BREAST REDUCTION SURGERY  2000  aprrox  . ENDOMETRIAL ABLATION W/ NOVASURE  2011   in office  . EXCISION LEFT BREAST BX  12-07-1999   benign  . LAPAROSCOPIC CHOLECYSTECTOMY  02-09-2005  . LAPAROSCOPIC VAGINAL HYSTERECTOMY WITH SALPINGO OOPHORECTOMY Bilateral 04/12/2016   Procedure: LAPAROSCOPIC ASSISTED VAGINAL HYSTERECTOMY WITH SALPINGO OOPHORECTOMY;  Surgeon: Arvella Nigh, MD;  Location: [facility];  Service: Gynecology;  Laterality: Bilateral;  . REDUCTION MAMMAPLASTY Bilateral   . ROUX-EN-Y GASTRIC  BYPASS  06/  2002  . TUBAL LIGATION  10/ 1990     OB History   None      Home Medications    Prior to Admission medications   Medication Sig Start Date End Date Taking? Authorizing Provider  ALPRAZolam Duanne Moron) 1 MG tablet Take 1 mg by mouth 3 (three) times daily. 08/13/18  Yes [provider]  buPROPion (WELLBUTRIN XL) 300 MG 24 hr tablet Take 300 mg by mouth at  bedtime. 07/26/18  Yes [provider]  FLUoxetine (PROZAC) 20 MG capsule Take 20 mg by mouth daily. 07/26/18  Yes [provider]  gabapentin (NEURONTIN) 600 MG tablet Take 1,200 mg by mouth 3 (three) times daily. 08/12/18  Yes [provider]  cephALEXin (KEFLEX) 500 MG capsule Take 1 capsule (500 mg total) by mouth 4 (four) times daily. Patient not taking: Reported on 08/15/2018 03/13/17   Barnet Glasgow, NP  HYDROcodone-acetaminophen (NORCO) 5-325 MG tablet Take 1 tablet by mouth 2 (two) times daily as needed. Patient not taking: Reported on 08/15/2018 03/28/17   Leandrew Koyanagi, MD  HYDROcodone-acetaminophen (NORCO) 5-325 MG tablet Take 1 tablet by mouth 3 (three) times daily as needed for moderate pain. Patient not taking: Reported on 08/15/2018 06/30/18   Aundra Dubin, PA-C  meclizine (ANTIVERT) 25 MG tablet Take 1 tablet (25 mg total) by mouth 3 (three) times daily as needed for dizziness. 08/15/18   Fawze, Mina A, PA-C  methocarbamol (ROBAXIN) 500 MG tablet Take 1 tablet (500 mg total) by mouth every 6 (six) hours as needed for muscle spasms. Patient not taking: Reported on 08/15/2018 03/24/17   Leandrew Koyanagi, MD  phenazopyridine (PYRIDIUM) 200 MG tablet Take 1 tablet (200 mg total) by mouth 3 (three) times daily as needed for pain. Patient not taking: Reported on 08/15/2018 03/13/17   Barnet Glasgow, NP  predniSONE (STERAPRED UNI-PAK 21 TAB) 10 MG (21) TBPK tablet Take as directed Patient not taking: Reported on 08/15/2018 03/24/17   Leandrew Koyanagi, MD  tiZANidine (ZANAFLEX) 2 MG tablet Take 1 tablet (2 mg total) by mouth 3 (three) times daily as needed for muscle spasms. Patient not taking: Reported on 08/15/2018 04/12/17   Leandrew Koyanagi, MD  traMADol (ULTRAM) 50 MG tablet Take 2 tablets (100 mg total) by mouth every 6 (six) hours as needed. Patient not taking: Reported on 08/15/2018 05/23/17   Leandrew Koyanagi, MD  traMADol (ULTRAM) 50 MG tablet Take 1 tablet (50  mg total) by mouth every 6 (six) hours as needed. Patient not taking: Reported on 08/15/2018 06/23/17   Leandrew Koyanagi, MD  traMADol Veatrice Bourbon) 50 MG tablet Take 1 tab po bid prn Patient not taking: Reported on 08/15/2018 07/17/18   Leandrew Koyanagi, MD    Family History Family History  Problem Relation Age of Onset  . Colon polyps Mother   . Breast cancer Maternal Aunt   . Stomach cancer Maternal Grandmother   . Diabetes Maternal Grandmother   . Heart disease Maternal Grandmother   . Stomach cancer Maternal Uncle   . Colon cancer Maternal Uncle        x3  . Diabetes Maternal Aunt        Type I  . Diabetes Maternal Uncle        x5 - Type II  . Heart disease Paternal Grandmother   . Esophageal cancer Neg Hx   . Gallbladder disease Neg Hx   . Seizures Neg Hx  Social History Social History   Tobacco Use  . Smoking status: Never Smoker  . Smokeless tobacco: Never Used  Substance Use Topics  . Alcohol use: No    Comment: drinks daily - none since 08/2012 per pt.  . Drug use: No     Allergies   Nsaids; Adhesive [tape]; Latex; and Sulfa antibiotics   Review of Systems Review of Systems  Constitutional: Negative for chills and fever.  Eyes: Negative for visual disturbance.  Respiratory: Negative for shortness of breath.   Cardiovascular: Negative for chest pain.  Gastrointestinal: Negative for abdominal pain, nausea and vomiting.  Neurological: Positive for dizziness and headaches. Negative for syncope, weakness and numbness.  All other systems reviewed and are negative.    Physical Exam Updated Vital Signs BP (!) 141/111   Pulse 71   Resp (!) 26   LMP 09/30/2012   SpO2 99%   Physical Exam  Constitutional: She is oriented to person, place, and time. She appears well-developed and well-nourished. No distress.  HENT:  Head: Normocephalic and atraumatic.  Eyes: Conjunctivae are normal. Right eye exhibits no discharge. Left eye exhibits no discharge.  Neck: Normal  range of motion. Neck supple. No JVD present. No tracheal deviation present.  Cardiovascular: Normal rate, regular rhythm, normal heart sounds and intact distal pulses.  Pulmonary/Chest: Effort normal and breath sounds normal.  Abdominal: Soft. Bowel sounds are normal. She exhibits no distension. There is no tenderness.  Musculoskeletal: She exhibits no edema.  Neurological: She is alert and oriented to person, place, and time. No cranial nerve deficit or sensory deficit. She exhibits normal muscle tone.  Mental Status:  Alert, thought content appropriate, able to give a coherent history. Speech fluent without evidence of aphasia. Able to follow 2 step commands without difficulty.  Cranial Nerves:  II:  Peripheral visual fields grossly normal, pupils equal, round, reactive to light III,IV, VI: ptosis not present, extra-ocular motions intact bilaterally  V,VII: smile symmetric, facial light touch sensation equal VIII: hearing grossly normal to voice  X: uvula elevates symmetrically  XI: bilateral shoulder shrug symmetric and strong XII: midline tongue extension without fassiculations Motor:  Normal tone. 5/5 strength of BUE and BLE major muscle groups including strong and equal grip strength and dorsiflexion/plantar flexion Sensory: light touch normal in all extremities. Cerebellar: normal finger-to-nose with bilateral upper extremities Gait: slightly shuffling gait but exhibits good balance. Able to walk on toes and heels with ease.  States she feels dizzy with ambulating. No pronator drift.  Romberg sign absent. Dizziness worsens with rapid head turning.     Skin: Skin is warm and dry. No erythema.  Psychiatric: She has a normal mood and affect. Her behavior is normal.  Nursing note and vitals reviewed.    ED Treatments / Results  Labs (all labs ordered are listed, but only abnormal results are displayed) Labs Reviewed  BASIC METABOLIC PANEL - Abnormal; Notable for the following  components:      Result Value   BUN <5 (*)    All other components within normal limits  CBC WITH DIFFERENTIAL/PLATELET    EKG EKG Interpretation  Date/Time:  Tuesday August 15 2018 15:50:04 EST Ventricular Rate:  68 PR Interval:    QRS Duration: 107 QT Interval:  423 QTC Calculation: 450 R Axis:   58 Text Interpretation:  Sinus rhythm Minimal ST depression, inferior leads Baseline wander in lead(s) II III aVF V4 No significant change was found Confirmed by Jola Schmidt [phone]) on 08/15/2018 7:32:48 PM  Radiology Ct Head Wo Contrast  Result Date: 08/15/2018 CLINICAL DATA:  Vertigo, episodic, peripheral EXAM: CT HEAD WITHOUT CONTRAST TECHNIQUE: Contiguous axial images were obtained from the base of the skull through the vertex without intravenous contrast. COMPARISON:  07/03/2016 FINDINGS: Brain: No evidence of acute infarction, hemorrhage, hydrocephalus, extra-axial collection or mass lesion/mass effect. Vascular: No hyperdense vessel or unexpected calcification. Skull: Normal. Negative for fracture or focal lesion. Sinuses/Orbits: Negative. Mastoid and middle ear spaces are also clear. IMPRESSION: Negative head CT. Electronically Signed   By: Monte Fantasia M.D.   On: 08/15/2018 18:59    Procedures Procedures (including critical care time)  Medications Ordered in ED Medications  meclizine (ANTIVERT) tablet 25 mg (25 mg Oral Given 08/15/18 1713)  diazepam (VALIUM) tablet 5 mg (5 mg Oral Given 08/15/18 1910)     Initial Impression / Assessment and Plan / ED Course  I have reviewed the triage vital signs and the nursing notes.  Pertinent labs & imaging results that were available during my care of the patient were reviewed by me and considered in my medical decision making (see chart for details).    Patient presents for evaluation of dizziness beginning yesterday.  She is afebrile, mildly hypertensive while in the ED which per chart review appears to be her baseline.   She is nontoxic in appearance.  No focal neurologic deficits and normal neuro exam aside from some dizziness with rapid head movements.  EKG shows normal sinus rhythm, no ischemic changes or arrhythmia.  Lab work reviewed by me shows no leukocytosis, no metabolic derangements.  Creatinine within normal limits.  CT scan shows no acute intracranial abnormality, no evidence of CVA, ICH, SAH.  No evidence of hypertensive emergency.  She was given meclizine and Valium with significant improvement in her symptoms and on reevaluation is resting comfortably in no apparent distress.  She is able to ambulate without difficulty while in the ED and tolerating p.o. food and fluids.  Symptoms appear to be suggestive of peripheral vertigo.  Will discharge with meclizine, recommend follow-up with PCP for reevaluation of symptoms and possible referral to discuss therapy.  Discussed strict ED return precautions. Pt verbalized understanding of and agreement with plan and is safe for discharge home at this time.   Final Clinical Impressions(s) / ED Diagnoses   Final diagnoses:  Dizziness    ED Discharge Orders         Ordered    meclizine (ANTIVERT) 25 MG tablet  3 times daily PRN     08/15/18 1932           Debroah Baller 08/15/18 1945    Jola Schmidt, MD 08/15/18 2014    Jola Schmidt, MD 08/15/18 2014

## 2018-10-04 DIAGNOSIS — U071 COVID-19: Secondary | ICD-10-CM

## 2018-10-04 HISTORY — DX: COVID-19: U07.1

## 2019-04-10 ENCOUNTER — Other Ambulatory Visit: Payer: Self-pay | Admitting: *Deleted

## 2019-04-10 DIAGNOSIS — Z20822 Contact with and (suspected) exposure to covid-19: Secondary | ICD-10-CM

## 2019-04-10 NOTE — Progress Notes (Unsigned)
ab 

## 2019-04-14 ENCOUNTER — Inpatient Hospital Stay (HOSPITAL_COMMUNITY)
Admission: EM | Admit: 2019-04-14 | Discharge: 2019-04-15 | DRG: 193 | Disposition: A | Discharge status: Home / Self Care | Payer: BC Managed Care – PPO | Attending: Family Medicine | Admitting: Family Medicine

## 2019-04-14 ENCOUNTER — Other Ambulatory Visit: Payer: Self-pay

## 2019-04-14 ENCOUNTER — Emergency Department (HOSPITAL_COMMUNITY): Payer: BC Managed Care – PPO

## 2019-04-14 ENCOUNTER — Encounter (HOSPITAL_COMMUNITY): Payer: Self-pay | Admitting: Emergency Medicine

## 2019-04-14 DIAGNOSIS — J9601 Acute respiratory failure with hypoxia: Secondary | ICD-10-CM | POA: Diagnosis present

## 2019-04-14 DIAGNOSIS — Z20828 Contact with and (suspected) exposure to other viral communicable diseases: Secondary | ICD-10-CM | POA: Diagnosis present

## 2019-04-14 DIAGNOSIS — J9690 Respiratory failure, unspecified, unspecified whether with hypoxia or hypercapnia: Secondary | ICD-10-CM | POA: Diagnosis present

## 2019-04-14 DIAGNOSIS — F411 Generalized anxiety disorder: Secondary | ICD-10-CM | POA: Diagnosis present

## 2019-04-14 DIAGNOSIS — Z6841 Body Mass Index (BMI) 40.0 and over, adult: Secondary | ICD-10-CM

## 2019-04-14 DIAGNOSIS — R509 Fever, unspecified: Secondary | ICD-10-CM

## 2019-04-14 DIAGNOSIS — G92 Toxic encephalopathy: Secondary | ICD-10-CM | POA: Diagnosis present

## 2019-04-14 DIAGNOSIS — R569 Unspecified convulsions: Secondary | ICD-10-CM | POA: Diagnosis present

## 2019-04-14 DIAGNOSIS — K567 Ileus, unspecified: Secondary | ICD-10-CM | POA: Diagnosis present

## 2019-04-14 DIAGNOSIS — F329 Major depressive disorder, single episode, unspecified: Secondary | ICD-10-CM | POA: Diagnosis present

## 2019-04-14 DIAGNOSIS — J9602 Acute respiratory failure with hypercapnia: Secondary | ICD-10-CM | POA: Diagnosis present

## 2019-04-14 DIAGNOSIS — N3001 Acute cystitis with hematuria: Secondary | ICD-10-CM | POA: Diagnosis present

## 2019-04-14 DIAGNOSIS — J189 Pneumonia, unspecified organism: Secondary | ICD-10-CM | POA: Diagnosis not present

## 2019-04-14 DIAGNOSIS — R410 Disorientation, unspecified: Secondary | ICD-10-CM

## 2019-04-14 DIAGNOSIS — A419 Sepsis, unspecified organism: Secondary | ICD-10-CM | POA: Diagnosis not present

## 2019-04-14 DIAGNOSIS — E669 Obesity, unspecified: Secondary | ICD-10-CM | POA: Diagnosis present

## 2019-04-14 DIAGNOSIS — F32A Depression, unspecified: Secondary | ICD-10-CM | POA: Diagnosis present

## 2019-04-14 DIAGNOSIS — R0902 Hypoxemia: Secondary | ICD-10-CM

## 2019-04-14 DIAGNOSIS — N393 Stress incontinence (female) (male): Secondary | ICD-10-CM | POA: Diagnosis present

## 2019-04-14 DIAGNOSIS — J181 Lobar pneumonia, unspecified organism: Secondary | ICD-10-CM

## 2019-04-14 LAB — LACTIC ACID, PLASMA: Lactic Acid, Venous: 1.9 mmol/L (ref 0.5–1.9)

## 2019-04-14 LAB — CBC WITH DIFFERENTIAL/PLATELET
Abs Immature Granulocytes: 0.06 10*3/uL (ref 0.00–0.07)
Basophils Absolute: 0 10*3/uL (ref 0.0–0.1)
Basophils Relative: 0 %
Eosinophils Absolute: 0.1 10*3/uL (ref 0.0–0.5)
Eosinophils Relative: 1 %
HCT: 34.7 % — ABNORMAL LOW (ref 36.0–46.0)
Hemoglobin: 10.2 g/dL — ABNORMAL LOW (ref 12.0–15.0)
Immature Granulocytes: 1 %
Lymphocytes Relative: 8 %
Lymphs Abs: 0.8 10*3/uL (ref 0.7–4.0)
MCH: 25.5 pg — ABNORMAL LOW (ref 26.0–34.0)
MCHC: 29.4 g/dL — ABNORMAL LOW (ref 30.0–36.0)
MCV: 86.8 fL (ref 80.0–100.0)
Monocytes Absolute: 0.7 10*3/uL (ref 0.1–1.0)
Monocytes Relative: 7 %
Neutro Abs: 8.4 10*3/uL — ABNORMAL HIGH (ref 1.7–7.7)
Neutrophils Relative %: 83 %
Platelets: 322 10*3/uL (ref 150–400)
RBC: 4 MIL/uL (ref 3.87–5.11)
RDW: 15.3 % (ref 11.5–15.5)
WBC: 10 10*3/uL (ref 4.0–10.5)
nRBC: 0 % (ref 0.0–0.2)

## 2019-04-14 LAB — URINALYSIS, ROUTINE W REFLEX MICROSCOPIC
Glucose, UA: NEGATIVE mg/dL
Ketones, ur: NEGATIVE mg/dL
Nitrite: NEGATIVE
Protein, ur: 30 mg/dL — AB
RBC / HPF: >50 RBC/hpf — ABNORMAL HIGH (ref 0–5)
Specific Gravity, Urine: 1.023 (ref 1.005–1.030)
pH: 5 (ref 5.0–8.0)

## 2019-04-14 LAB — TRIGLYCERIDES: Triglycerides: 67 mg/dL (ref <150)

## 2019-04-14 LAB — RAPID URINE DRUG SCREEN, HOSP PERFORMED
Amphetamines: NOT DETECTED
Barbiturates: NOT DETECTED
Benzodiazepines: POSITIVE — AB
Cocaine: NOT DETECTED
Opiates: POSITIVE — AB
Tetrahydrocannabinol: NOT DETECTED

## 2019-04-14 LAB — PROTIME-INR
INR: 0.9 (ref 0.8–1.2)
Prothrombin Time: 12 seconds (ref 11.4–15.2)

## 2019-04-14 LAB — COMPREHENSIVE METABOLIC PANEL
ALT: 15 U/L (ref 0–44)
AST: 18 U/L (ref 15–41)
Albumin: 3.4 g/dL — ABNORMAL LOW (ref 3.5–5.0)
Alkaline Phosphatase: 92 U/L (ref 38–126)
Anion gap: 12 (ref 5–15)
BUN: 11 mg/dL (ref 6–20)
CO2: 24 mmol/L (ref 22–32)
Calcium: 8.9 mg/dL (ref 8.9–10.3)
Chloride: 102 mmol/L (ref 98–111)
Creatinine, Ser: 0.98 mg/dL (ref 0.44–1.00)
GFR calc Af Amer: >60 mL/min (ref >60)
GFR calc non Af Amer: >60 mL/min (ref >60)
Glucose, Bld: 120 mg/dL — ABNORMAL HIGH (ref 70–99)
Potassium: 3.7 mmol/L (ref 3.5–5.1)
Sodium: 138 mmol/L (ref 135–145)
Total Bilirubin: 0.6 mg/dL (ref 0.3–1.2)
Total Protein: 7.2 g/dL (ref 6.5–8.1)

## 2019-04-14 LAB — POCT I-STAT 7, (LYTES, BLD GAS, ICA,H+H)
Acid-base deficit: 4 mmol/L — ABNORMAL HIGH (ref 0.0–2.0)
Bicarbonate: 23 mmol/L (ref 20.0–28.0)
Calcium, Ion: 1.16 mmol/L (ref 1.15–1.40)
HCT: 32 % — ABNORMAL LOW (ref 36.0–46.0)
Hemoglobin: 10.9 g/dL — ABNORMAL LOW (ref 12.0–15.0)
O2 Saturation: 94 %
Patient temperature: 101.7
Potassium: 3.6 mmol/L (ref 3.5–5.1)
Sodium: 140 mmol/L (ref 135–145)
TCO2: 24 mmol/L (ref 22–32)
pCO2 arterial: 53.1 mmHg — ABNORMAL HIGH (ref 32.0–48.0)
pH, Arterial: 7.253 — ABNORMAL LOW (ref 7.350–7.450)
pO2, Arterial: 91 mmHg (ref 83.0–108.0)

## 2019-04-14 LAB — AMMONIA: Ammonia: 21 umol/L (ref 9–35)

## 2019-04-14 LAB — NOVEL CORONAVIRUS, NAA: SARS-CoV-2, NAA: NOT DETECTED

## 2019-04-14 LAB — C-REACTIVE PROTEIN: CRP: 1.6 mg/dL — ABNORMAL HIGH (ref <1.0)

## 2019-04-14 LAB — LACTATE DEHYDROGENASE: LDH: 175 U/L (ref 98–192)

## 2019-04-14 LAB — ACETAMINOPHEN LEVEL: Acetaminophen (Tylenol), Serum: 11 ug/mL (ref 10–30)

## 2019-04-14 LAB — I-STAT BETA HCG BLOOD, ED (MC, WL, AP ONLY): I-stat hCG, quantitative: <5 m[IU]/mL (ref <5)

## 2019-04-14 LAB — D-DIMER, QUANTITATIVE: D-Dimer, Quant: 1.79 ug/mL-FEU — ABNORMAL HIGH (ref 0.00–0.50)

## 2019-04-14 LAB — TSH: TSH: 1.115 u[IU]/mL (ref 0.350–4.500)

## 2019-04-14 LAB — FIBRINOGEN: Fibrinogen: 534 mg/dL — ABNORMAL HIGH (ref 210–475)

## 2019-04-14 LAB — FERRITIN: Ferritin: 16 ng/mL (ref 11–307)

## 2019-04-14 LAB — SARS CORONAVIRUS 2 BY RT PCR (HOSPITAL ORDER, PERFORMED IN ~~LOC~~ HOSPITAL LAB): SARS Coronavirus 2: NEGATIVE

## 2019-04-14 LAB — PROCALCITONIN: Procalcitonin: 1.03 ng/mL

## 2019-04-14 LAB — SALICYLATE LEVEL: Salicylate Lvl: <7 mg/dL (ref 2.8–30.0)

## 2019-04-14 MED ORDER — ACETAMINOPHEN 500 MG PO TABS
1000.0000 mg | ORAL_TABLET | Freq: Once | ORAL | Status: AC
  Administered 2019-04-14: 1000 mg via ORAL
  Filled 2019-04-14: qty 2

## 2019-04-14 MED ORDER — DEXTROSE IN LACTATED RINGERS 5 % IV SOLN
INTRAVENOUS | Status: DC
  Administered 2019-04-14 – 2019-04-15 (×2): via INTRAVENOUS

## 2019-04-14 MED ORDER — PIPERACILLIN-TAZOBACTAM 3.375 G IVPB 30 MIN
3.3750 g | Freq: Once | INTRAVENOUS | Status: AC
  Administered 2019-04-14: 3.375 g via INTRAVENOUS
  Filled 2019-04-14: qty 50

## 2019-04-14 MED ORDER — ACETAMINOPHEN 650 MG RE SUPP: 650.0000 mg | Freq: Four times a day (QID) | RECTAL | Status: DC | PRN

## 2019-04-14 MED ORDER — IOHEXOL 350 MG/ML SOLN
100.0000 mL | Freq: Once | INTRAVENOUS | Status: AC | PRN
  Administered 2019-04-14: 100 mL via INTRAVENOUS

## 2019-04-14 MED ORDER — MORPHINE SULFATE (PF) 4 MG/ML IV SOLN: 4.0000 mg | Freq: Once | INTRAVENOUS | Status: DC

## 2019-04-14 MED ORDER — ONDANSETRON HCL 4 MG PO TABS: 4.0000 mg | ORAL_TABLET | Freq: Four times a day (QID) | ORAL | Status: DC | PRN

## 2019-04-14 MED ORDER — ONDANSETRON HCL 4 MG/2ML IJ SOLN: 4.0000 mg | Freq: Once | INTRAMUSCULAR | Status: DC

## 2019-04-14 MED ORDER — ASPIRIN-ACETAMINOPHEN-CAFFEINE 250-250-65 MG PO TABS
2.0000 | ORAL_TABLET | Freq: Once | ORAL | Status: AC
  Administered 2019-04-15: 2 via ORAL
  Filled 2019-04-14: qty 2

## 2019-04-14 MED ORDER — ACETAMINOPHEN 325 MG PO TABS
650.0000 mg | ORAL_TABLET | Freq: Four times a day (QID) | ORAL | Status: DC | PRN
  Administered 2019-04-14 – 2019-04-15 (×2): 650 mg via ORAL
  Filled 2019-04-14 (×2): qty 2

## 2019-04-14 MED ORDER — SODIUM CHLORIDE 0.9 % IV SOLN
1.0000 g | INTRAVENOUS | Status: DC
  Administered 2019-04-14: 23:00:00 1 g via INTRAVENOUS
  Filled 2019-04-14: qty 10

## 2019-04-14 MED ORDER — SODIUM CHLORIDE 0.9 % IV SOLN
500.0000 mg | INTRAVENOUS | Status: DC
  Administered 2019-04-15: 500 mg via INTRAVENOUS
  Filled 2019-04-14 (×3): qty 500

## 2019-04-14 MED ORDER — ENOXAPARIN SODIUM 40 MG/0.4ML ~~LOC~~ SOLN
40.0000 mg | SUBCUTANEOUS | Status: DC
  Administered 2019-04-14: 40 mg via SUBCUTANEOUS
  Filled 2019-04-14: qty 0.4

## 2019-04-14 MED ORDER — VANCOMYCIN HCL 10 G IV SOLR
2000.0000 mg | Freq: Once | INTRAVENOUS | Status: AC
  Administered 2019-04-14: 2000 mg via INTRAVENOUS
  Filled 2019-04-14: qty 2000

## 2019-04-14 MED ORDER — IPRATROPIUM-ALBUTEROL 0.5-2.5 (3) MG/3ML IN SOLN: 3.0000 mL | RESPIRATORY_TRACT | Status: DC | PRN

## 2019-04-14 MED ORDER — ONDANSETRON HCL 4 MG/2ML IJ SOLN: 4.0000 mg | Freq: Four times a day (QID) | INTRAMUSCULAR | Status: DC | PRN

## 2019-04-14 NOTE — ED Provider Notes (Signed)
Assumed care of patient at 1500.  Patient presenting with multiple weeks of cough, shortness of breath and fever.  Yesterday was a better day but husband states this morning when she woke up she was not herself and found to be hypoxic.  She initially required 6 L but now has been weaned down to 2 L with control of her fever.  Patient's initial COVID test was negative however d-dimer elevated lactate within normal limits, INR is normal and CRP mildly elevated.  Patient was positive for benzos and opiates but throughout her stay mental status has improved.  CT shows colonic ileus and UA concerning for infection but no signs of [place].  Also CAT scan there is no evidence of PE but patchy bilateral infiltrates left greater than right.  Patient is still requiring 2 L of oxygen and has received antibiotics.  Will admit for further care but patient would still be a patient under investigation.   Blanchie Dessert, MD 04/14/19 1744

## 2019-04-14 NOTE — ED Triage Notes (Signed)
Pt BIB [facility] for SOB, fever, and altered mental status. EMS reports the family told them the pt had a COVID swab done on Tuesday 7/7 and the results are not back yet. EMS reports the family told them the pt has been SOB for 3 weeks and it has gotten worse over the last week. Family reported to EMS that the patient woke up this morning hot, sweaty, feverish, and altered. EMS advised the pt was 80% on room air and upon placing the pt on 15 lpm by NRB, the pt's oxygen saturation improved. EMS reports a fever on their temporal thermometer. EMS advised vital signs stable, 12 lead unremarkable, and a 22g IV established in her right hand. EMS advised the pt was lethargic and only CAOx1.   Pt unable to fully verbalize any complaints but is hot to the touch.

## 2019-04-14 NOTE — ED Notes (Addendum)
Pt placed on 4 lpm by cannula.

## 2019-04-14 NOTE — ED Notes (Signed)
Pt c.o a headache 

## 2019-04-14 NOTE — ED Notes (Signed)
Pt returned from c-t  Asking for pain medicine for her abd pain

## 2019-04-14 NOTE — ED Provider Notes (Addendum)
[facility] EMERGENCY DEPARTMENT Provider Note   CSN: [id number] Arrival date & time: 04/14/19  1038    History   Chief Complaint Chief Complaint  Patient presents with  . Fever  . Shortness of Breath  . Altered Mental Status    HPI Tammy [name] is a 58 y.o. female.     HPI History is from the patient's husband.  Patient was significantly confused on arrival with difficult recall.  Patient's husband reports for about 3 weeks she has been sick with what seemed mostly like a cold.  Would wax and wane.  She had a lot of coughing with the symptoms.  But she would have a lot of days where she was fine doing normal activities.  Some days she would feel worse than others and have to rest more.  They actually went and did coronavirus testing last Tuesday but do not have results back yet.  Reports that yesterday evening she had dinner and even cleaned up the kitchen.  She reports that she went to bed around 11:30 at night.  He got up once during the night to go to the bathroom.  He reports that at 8 AM she got up to go the bathroom again and then suddenly complained of being really cold and got a Designer, fashion/clothing.  She then became quite confused and would not follow any instructions.  She was not responding normally.  He reports at that point they determined the call EMS.  He does report that she has been taking Delsym and Robitussin-DM quite a bit to control cough symptoms.  As historian, patient was denying headache or chest pain.  She reports just feeling very weak. Past Medical History:  Diagnosis Date  . Anxiety   . Arthritis    neck and lower back - deg disc disease, fingers  . Depression   . Frequency of urination   . GERD (gastroesophageal reflux disease)   . History of seizure per pt no seizure since    11-07-2015  in setting of stress and sleep deprivation  /  negative work-up by neurologist unknown idiology  . Hypothyroidism   . Injury of thumb, left    03-31-2016     . Pelvic relaxation   . SUI (stress urinary incontinence, female)   . Wears glasses     Patient Active Problem List   Diagnosis Date Noted  . Closed nondisplaced fracture of head of left radius 06/20/2018  . Sprain of left wrist 06/20/2018  . Peroneal tendinitis, right leg 06/23/2017  . Trochanteric bursitis, right hip 06/23/2017  . Acute right-sided low back pain with left-sided sciatica 03/24/2017  . Pain in right hip 03/24/2017  . Impingement syndrome of left shoulder 08/30/2016  . Impingement syndrome of right shoulder 08/30/2016  . Plantar fasciitis, bilateral 08/30/2016  . Pelvic relaxation 04/13/2016    Class: Present on Admission  . S/P hysterectomy 04/12/2016  . Depression 11/16/2015  . Generalized anxiety disorder 11/16/2015  . Convulsion ([place]) 11/16/2015  . SUI (stress urinary incontinence, female) 09/26/2012    Past Surgical History:  Procedure Laterality Date  . ANTERIOR AND POSTERIOR REPAIR  09/25/2012   Procedure: ANTERIOR (CYSTOCELE) AND POSTERIOR REPAIR (RECTOCELE);  Surgeon: Lahoma Crocker, MD;  Location: [facility];  Service: Gynecology;  Laterality: N/A;  . ANTERIOR AND POSTERIOR REPAIR WITH SACROSPINOUS FIXATION N/A 04/12/2016   Procedure: ANTERIOR AND POSTERIOR REPAIR WITH SACROSPINOUS LIGAMENT SUSPENSION, CYSTOSCOPY;  Surgeon: Arvella Nigh, MD;  Location: [facility];  Service: Gynecology;  Laterality: N/A;  . APPENDECTOMY  1987  . BREAST EXCISIONAL BIOPSY Left    No scar visable   . BREAST REDUCTION SURGERY  2000  aprrox  . ENDOMETRIAL ABLATION W/ NOVASURE  2011   in office  . EXCISION LEFT BREAST BX  12-07-1999   benign  . LAPAROSCOPIC CHOLECYSTECTOMY  02-09-2005  . LAPAROSCOPIC VAGINAL HYSTERECTOMY WITH SALPINGO OOPHORECTOMY Bilateral 04/12/2016   Procedure: LAPAROSCOPIC ASSISTED VAGINAL HYSTERECTOMY WITH SALPINGO OOPHORECTOMY;  Surgeon: Arvella Nigh, MD;  Location: [facility];  Service: Gynecology;  Laterality: Bilateral;   . REDUCTION MAMMAPLASTY Bilateral   . ROUX-EN-Y GASTRIC BYPASS  06/  2002  . TUBAL LIGATION  10/ 1990     OB History   No obstetric history on file.      Home Medications    Prior to Admission medications   Medication Sig Start Date End Date Taking? Authorizing Provider  acetaminophen (TYLENOL) 325 MG tablet Take 650 mg by mouth every 6 (six) hours as needed for mild pain or fever.   Yes [provider]  buPROPion (WELLBUTRIN XL) 300 MG 24 hr tablet Take 300 mg by mouth daily.  07/26/18  Yes [provider]  clonazePAM (KLONOPIN) 1 MG tablet Take 1 mg by mouth 3 (three) times daily as needed for anxiety. 03/26/19  Yes [provider]  FLUoxetine (PROZAC) 20 MG capsule Take 20 mg by mouth at bedtime.  07/26/18  Yes [provider]  gabapentin (NEURONTIN) 600 MG tablet Take 1,200 mg by mouth 3 (three) times daily. 08/12/18  Yes [provider]  OLANZapine (ZYPREXA) 10 MG tablet Take 10 mg by mouth at bedtime. 04/04/19  Yes [provider]    Family History Family History  Problem Relation Age of Onset  . Colon polyps Mother   . Breast cancer Maternal Aunt   . Stomach cancer Maternal Grandmother   . Diabetes Maternal Grandmother   . Heart disease Maternal Grandmother   . Stomach cancer Maternal Uncle   . Colon cancer Maternal Uncle        x3  . Diabetes Maternal Aunt        Type I  . Diabetes Maternal Uncle        x5 - Type II  . Heart disease Paternal Grandmother   . Esophageal cancer Neg Hx   . Gallbladder disease Neg Hx   . Seizures Neg Hx     Social History Social History   Tobacco Use  . Smoking status: Never Smoker  . Smokeless tobacco: Never Used  Substance Use Topics  . Alcohol use: No    Comment: drinks daily - none since 08/2012 per pt.  . Drug use: No     Allergies   Nsaids, Adhesive [tape], Latex, and Sulfa antibiotics   Review of Systems Review of Systems 10 Systems reviewed and are negative  for acute change except as noted in the HPI.   Physical Exam Updated Vital Signs BP 125/87   Pulse 95   Temp 100.3 F (37.9 C) (Rectal)   Resp 16   Ht 5\' 3"  (1.6 m)   Wt 104.3 kg   LMP 09/30/2012   SpO2 96%   BMI 40.73 kg/m   Physical Exam Constitutional:      Comments: Patient is confused and ill in appearance.  She is answering some questions appropriately but appears to be very fatigued and slightly diaphoretic.  Mild increased work of breathing.  Oxygen saturation is high  90s on 2 L.  HENT:     Head: Normocephalic and atraumatic.     Nose: Nose normal.     Mouth/Throat:     Mouth: Mucous membranes are moist.     Pharynx: Oropharynx is clear.  Eyes:     Extraocular Movements: Extraocular movements intact.     Pupils: Pupils are equal, round, and reactive to light.  Neck:     Musculoskeletal: Neck supple.  Cardiovascular:     Rate and Rhythm: Normal rate and regular rhythm.  Pulmonary:     Effort: Pulmonary effort is normal.     Breath sounds: Normal breath sounds.  Abdominal:     General: There is no distension.     Palpations: Abdomen is soft.     Tenderness: There is no abdominal tenderness. There is no guarding.  Musculoskeletal: Normal range of motion.        General: No swelling or tenderness.     Right lower leg: No edema.     Left lower leg: No edema.     Comments: Patient does not have any lower extremity tenderness or edema.  No joint effusions.  She has a very minor appearing rash on the right lower leg that appears to be a contact dermatitis.  Fairly innocuous in appearance.  Also a little bit of a abrasion on the left lower extremity.  See attached images.  She does not have any streaking or plaques of erythema or rash.  Skin:    General: Skin is warm.     Comments: Patient is warm and slightly diaphoretic.  Neurological:     Comments: Patient is moderately confused.  She can answer some questions appropriately but is somewhat somnolent.  She will move  her extremities at command.  No focal motor deficit.          ED Treatments / Results  Labs (all labs ordered are listed, but only abnormal results are displayed) Labs Reviewed  COMPREHENSIVE METABOLIC PANEL - Abnormal; Notable for the following components:      Result Value   Glucose, Bld 120 (*)    Albumin 3.4 (*)    All other components within normal limits  CBC WITH DIFFERENTIAL/PLATELET - Abnormal; Notable for the following components:   Hemoglobin 10.2 (*)    HCT 34.7 (*)    MCH 25.5 (*)    MCHC 29.4 (*)    Neutro Abs 8.4 (*)    All other components within normal limits  URINALYSIS, ROUTINE W REFLEX MICROSCOPIC - Abnormal; Notable for the following components:   APPearance CLOUDY (*)    Hgb urine dipstick MODERATE (*)    Bilirubin Urine SMALL (*)    Protein, ur 30 (*)    Leukocytes,Ua MODERATE (*)    RBC / HPF >50 (*)    Bacteria, UA FEW (*)    All other components within normal limits  D-DIMER, QUANTITATIVE (NOT AT [facility]) - Abnormal; Notable for the following components:   D-Dimer, Quant 1.79 (*)    All other components within normal limits  FIBRINOGEN - Abnormal; Notable for the following components:   Fibrinogen 534 (*)    All other components within normal limits  C-REACTIVE PROTEIN - Abnormal; Notable for the following components:   CRP 1.6 (*)    All other components within normal limits  RAPID URINE DRUG SCREEN, HOSP PERFORMED - Abnormal; Notable for the following components:   Opiates POSITIVE (*)    Benzodiazepines POSITIVE (*)    All  other components within normal limits  POCT I-STAT 7, (LYTES, BLD GAS, ICA,H+H) - Abnormal; Notable for the following components:   pH, Arterial 7.253 (*)    pCO2 arterial 53.1 (*)    Acid-base deficit 4.0 (*)    HCT 32.0 (*)    Hemoglobin 10.9 (*)    All other components within normal limits  SARS CORONAVIRUS 2 (HOSPITAL ORDER, [facility])  CULTURE, BLOOD (ROUTINE X 2)  CULTURE, BLOOD  (ROUTINE X 2)  URINE CULTURE  LACTIC ACID, PLASMA  PROTIME-INR  PROCALCITONIN  LACTATE DEHYDROGENASE  FERRITIN  TRIGLYCERIDES  TSH  AMMONIA  ACETAMINOPHEN LEVEL  SALICYLATE LEVEL  BLOOD GAS, ARTERIAL  I-STAT BETA HCG BLOOD, ED ([facility], [facility], [facility] ONLY)    EKG EKG Interpretation  Date/Time:  Saturday April 14 2019 10:49:34 EDT Ventricular Rate:  106 PR Interval:    QRS Duration: 92 QT Interval:  320 QTC Calculation: 425 R Axis:   52 Text Interpretation:  Sinus tachycardia Minimal ST depression, diffuse leads no acute ischemic chang. tachy but otherwise similar to previous Confirmed by Charlesetta Shanks [phone]) on 04/14/2019 2:23:53 PM   Radiology Dg Chest Portable 1 View  Result Date: 04/14/2019 CLINICAL DATA:  Fever EXAM: PORTABLE CHEST 1 VIEW COMPARISON:  Chest CT July 03, 2016 FINDINGS: There Is mild atelectasis in the left mid lung. There is no evident edema or consolidation. Heart is borderline enlarged with pulmonary vascularity normal. No adenopathy. No bone lesions. IMPRESSION: Mild atelectasis left mid lung. No edema or consolidation. Heart borderline prominent. No adenopathy evident. Electronically Signed   By: Lowella Grip III M.D.   On: 04/14/2019 11:54    Procedures Procedures (including critical care time) CRITICAL CARE Performed by: Charlesetta Shanks   Total critical care time: 45 minutes  Critical care time was exclusive of separately billable procedures and treating other patients.  Critical care was necessary to treat or prevent imminent or life-threatening deterioration.  Critical care was time spent personally by me on the following activities: development of treatment plan with patient and/or surrogate as well as nursing, discussions with consultants, evaluation of patient's response to treatment, examination of patient, obtaining history from patient or surrogate, ordering and performing treatments and interventions, ordering and review of laboratory  studies, ordering and review of radiographic studies, pulse oximetry and re-evaluation of patient's condition. Medications Ordered in ED Medications  vancomycin (VANCOCIN) 2,000 mg in sodium chloride 0.9 % 500 mL IVPB (2,000 mg Intravenous New Bag/Given 04/14/19 1312)  acetaminophen (TYLENOL) tablet 1,000 mg (1,000 mg Oral Given 04/14/19 1130)  piperacillin-tazobactam (ZOSYN) IVPB 3.375 g (0 g Intravenous Stopped 04/14/19 1354)     Initial Impression / Assessment and Plan / ED Course  I have reviewed the triage vital signs and the nursing notes.  Pertinent labs & imaging results that were available during my care of the patient were reviewed by me and considered in my medical decision making (see chart for details).  Clinical Course as of Apr 13 1504  Sat Apr 14, 2019  1353 Check: Patient's heart rate and blood pressure are remaining stable.  Patient is appropriately answering questions for pharmacy.  Mental status appears to be improving.   [MP]  7253 Patient's blood pressure remains stable.  Her mental status has improved.  She still seems fatigued and ill but is answering questions appropriately.  She is oriented to person place and time.  I will titrate oxygen down to 2 L she has been 100% on 6  L.  Will observe for any hypoxia at 2 L.  CT scans are still pending.   [MP]    Clinical Course User Index [MP] Charlesetta Shanks, MD      Was hypoxic at home in the 4s.  She was confused and disoriented.  She has had respiratory symptoms for about 3 weeks.  She has outpatient COVID testing not resulted yet.  Our result is negative at this time.  Patient was febrile as well.  Blood pressure and heart rate were stable.  UA looks grossly infected plus hematuria.  Patient has shown significant improvement since placement on oxygen and resuscitation.  CT scans still pending.  At this time we will proceed with a CT angios to further delineate if there is pneumonia, possible PE or infiltrates not  identified on chest x-ray since main symptom has been hypoxia and cough.  Patient's husband reports that she was taking pretty consistent amounts of extremely sore fanned in the form of Delsym and Robitussin-DM.  It is possible these were contributing factors to somnolence and mental status change.  Patient's UA also tested positive for benzo and opiates.  Possibly poly-pharmacy induced somnolence backslash mental status change.  We will proceed with administration of antibiotics for positive UA and suspected pneumonia with possible early sepsis bacteremia.  anticipate admission pending CT results to rule out any other etiology. Patient's mental status has improved with resuscitiation now oriented x3.   Final Clinical Impressions(s) / ED Diagnoses   Final diagnoses:  Hypoxia  Acute cystitis with hematuria  Community acquired pneumonia, unspecified laterality  Fever, unspecified fever cause  Delirium    ED Discharge Orders    None       Charlesetta Shanks, MD 04/14/19 1430    Charlesetta Shanks, MD 04/14/19 1506

## 2019-04-14 NOTE — H&P (Addendum)
History and Physical    Tammy [name]:[id number] DOB: [birth year] DOA: 04/14/2019  PCP: Fanny Bien, MD   Patient coming from: Home   Chief Complaint:  Dyspnea.   HPI: Tammy [name] is a 58 y.o. female with medical history significant of obesity, depression and anxiety.  She reported cough for the last 2 weeks, dry, associated with fever, malaise and generalized weakness.  She had a COVID-19 test done on July 7 which turned negative.  Her symptoms continued to deteriorate, last night she had severe dyspnea and decreased mentation.  Her dyspnea was persistent, no improving or worsening factors, severe in intensity.  EMS was called and she was found hypoxic.  Denies any nausea, vomiting or diarrhea.  She had no antibiotic prescribed as an outpatient.  No history of pneumonia in the past.  I was not able to reach her husband for further information.  ED Course: In the emergency department she was in respiratory distress, hypoxic, required 6 L/min per nasal cannula, lung imaging has shown patchy bilateral infiltrates.  She received antibiotics and antipyretics.  Her oxygen requirements decreased down to 2 L/min per nasal cannula.  She was referred for admission for evaluation.  Review of Systems:  1. General: Positive fevers and chills, no weight gain or weight loss 2. ENT: No runny nose or sore throat, no hearing disturbances 3. Pulmonary: Positive dyspnea and dry cough, no wheezing, or hemoptysis 4. Cardiovascular: No angina, claudication, lower extremity edema, pnd or orthopnea 5. Gastrointestinal: No nausea or vomiting, no diarrhea or constipation 6. Hematology: No easy bruisability or frequent infections 7. Urology: Positive dysuria, but no hematuria or increased urinary frequency 8. Dermatology: No rashes. 9. Neurology: No seizures or paresthesias 10. Musculoskeletal: No joint pain or deformities  Past Medical History:  Diagnosis Date   Anxiety    Arthritis    neck and  lower back - deg disc disease, fingers   Depression    Frequency of urination    GERD (gastroesophageal reflux disease)    History of seizure per pt no seizure since    11-07-2015  in setting of stress and sleep deprivation  /  negative work-up by neurologist unknown idiology   Hypothyroidism    Injury of thumb, left    03-31-2016     Pelvic relaxation    SUI (stress urinary incontinence, female)    Wears glasses     Past Surgical History:  Procedure Laterality Date   ANTERIOR AND POSTERIOR REPAIR  09/25/2012   Procedure: ANTERIOR (CYSTOCELE) AND POSTERIOR REPAIR (RECTOCELE);  Surgeon: Lahoma Crocker, MD;  Location: [facility];  Service: Gynecology;  Laterality: N/A;   ANTERIOR AND POSTERIOR REPAIR WITH SACROSPINOUS FIXATION N/A 04/12/2016   Procedure: ANTERIOR AND POSTERIOR REPAIR WITH SACROSPINOUS LIGAMENT SUSPENSION, CYSTOSCOPY;  Surgeon: Arvella Nigh, MD;  Location: [facility];  Service: Gynecology;  Laterality: N/A;   APPENDECTOMY  1987   BREAST EXCISIONAL BIOPSY Left    No scar visable    BREAST REDUCTION SURGERY  2000  aprrox   ENDOMETRIAL ABLATION W/ NOVASURE  2011   in office   EXCISION LEFT BREAST BX  12-07-1999   benign   LAPAROSCOPIC CHOLECYSTECTOMY  02-09-2005   LAPAROSCOPIC VAGINAL HYSTERECTOMY WITH SALPINGO OOPHORECTOMY Bilateral 04/12/2016   Procedure: LAPAROSCOPIC ASSISTED VAGINAL HYSTERECTOMY WITH SALPINGO OOPHORECTOMY;  Surgeon: Arvella Nigh, MD;  Location: [facility];  Service: Gynecology;  Laterality: Bilateral;   REDUCTION MAMMAPLASTY Bilateral    ROUX-EN-Y GASTRIC BYPASS  06/  2002   TUBAL LIGATION  10/ 1990     reports that she has never smoked. She has never used smokeless tobacco. She reports that she does not drink alcohol or use drugs.  Allergies  Allergen Reactions   Nsaids Other (See Comments)    Post Gastric Bypass   Adhesive [Tape] Itching and Rash   Latex Itching and Rash   Sulfa  Antibiotics Itching and Rash    Family History  Problem Relation Age of Onset   Colon polyps Mother    Breast cancer Maternal Aunt    Stomach cancer Maternal Grandmother    Diabetes Maternal Grandmother    Heart disease Maternal Grandmother    Stomach cancer Maternal Uncle    Colon cancer Maternal Uncle        x3   Diabetes Maternal Aunt        Type I   Diabetes Maternal Uncle        x5 - Type II   Heart disease Paternal Grandmother    Esophageal cancer Neg Hx    Gallbladder disease Neg Hx    Seizures Neg Hx      Prior to Admission medications   Medication Sig Start Date End Date Taking? Authorizing Provider  acetaminophen (TYLENOL) 325 MG tablet Take 650 mg by mouth every 6 (six) hours as needed for mild pain or fever.   Yes [provider]  buPROPion (WELLBUTRIN XL) 300 MG 24 hr tablet Take 300 mg by mouth daily.  07/26/18  Yes [provider]  clonazePAM (KLONOPIN) 1 MG tablet Take 1 mg by mouth 3 (three) times daily as needed for anxiety. 03/26/19  Yes [provider]  FLUoxetine (PROZAC) 20 MG capsule Take 20 mg by mouth at bedtime.  07/26/18  Yes [provider]  gabapentin (NEURONTIN) 600 MG tablet Take 1,200 mg by mouth 3 (three) times daily. 08/12/18  Yes [provider]  OLANZapine (ZYPREXA) 10 MG tablet Take 10 mg by mouth at bedtime. 04/04/19  Yes [provider]    Physical Exam: Vitals:   04/14/19 1530 04/14/19 1545 04/14/19 1645 04/14/19 1715  BP: 120/74 117/71 104/70 117/70  Pulse: 75 77 74 73  Resp:   12 11  Temp:      TempSrc:      SpO2: 94% 94% 92% (!) 89%  Weight:      Height:        Vitals:   04/14/19 1530 04/14/19 1545 04/14/19 1645 04/14/19 1715  BP: 120/74 117/71 104/70 117/70  Pulse: 75 77 74 73  Resp:   12 11  Temp:      TempSrc:      SpO2: 94% 94% 92% (!) 89%  Weight:      Height:       General: deconditioned and ill looking appearing  Neurology: somnolent but easy to  arouse, mild confusion, non focal.  Head and Neck. Head normocephalic. Neck supple with no adenopathy or thyromegaly.   E ENT: mild pallor, no icterus, oral mucosa dry Cardiovascular: No JVD. S1-S2 present, rhythmic, no gallops, rubs, or murmurs. Trace lower extremity edema. Pulmonary: positive breath sounds bilaterally, poor movement, no wheezing or rhonchi, but bilateral rales, more on the left.  Gastrointestinal. Abdomen with no organomegaly, non tender, no rebound or guarding Skin. No rashes Musculoskeletal: no joint deformities    Labs on Admission: I have personally reviewed following labs and imaging studies  CBC: Recent Labs  Lab 04/14/19 1057 04/14/19 1232  WBC  10.0  --   NEUTROABS 8.4*  --   HGB 10.2* 10.9*  HCT 34.7* 32.0*  MCV 86.8  --   PLT 322  --    Basic Metabolic Panel: Recent Labs  Lab 04/14/19 1057 04/14/19 1232  NA 138 140  K 3.7 3.6  CL 102  --   CO2 24  --   GLUCOSE 120*  --   BUN 11  --   CREATININE 0.98  --   CALCIUM 8.9  --    GFR: Estimated Creatinine Clearance: 75 mL/min (by C-G formula based on SCr of 0.98 mg/dL). Liver Function Tests: Recent Labs  Lab 04/14/19 1057  AST 18  ALT 15  ALKPHOS 92  BILITOT 0.6  PROT 7.2  ALBUMIN 3.4*   No results for input(s): LIPASE, AMYLASE in the last 168 hours. Recent Labs  Lab 04/14/19 1307  AMMONIA 21   Coagulation Profile: Recent Labs  Lab 04/14/19 1057  INR 0.9   Cardiac Enzymes: No results for input(s): CKTOTAL, CKMB, CKMBINDEX, TROPONINI in the last 168 hours. BNP (last 3 results) No results for input(s): PROBNP in the last 8760 hours. HbA1C: No results for input(s): HGBA1C in the last 72 hours. CBG: No results for input(s): GLUCAP in the last 168 hours. Lipid Profile: Recent Labs    04/14/19 1307  TRIG 67   Thyroid Function Tests: Recent Labs    04/14/19 1307  TSH 1.115   Anemia Panel: Recent Labs    04/14/19 1307  FERRITIN 16   Urine analysis:    Component  Value Date/Time   COLORURINE YELLOW 04/14/2019 1138   APPEARANCEUR CLOUDY (A) 04/14/2019 1138   LABSPEC 1.023 04/14/2019 1138   PHURINE 5.0 04/14/2019 1138   GLUCOSEU NEGATIVE 04/14/2019 1138   HGBUR MODERATE (A) 04/14/2019 1138   BILIRUBINUR SMALL (A) 04/14/2019 1138   KETONESUR NEGATIVE 04/14/2019 1138   PROTEINUR 30 (A) 04/14/2019 1138   UROBILINOGEN 1.0 03/13/2017 1637   NITRITE NEGATIVE 04/14/2019 1138   LEUKOCYTESUR MODERATE (A) 04/14/2019 1138    Radiological Exams on Admission: Ct Head Wo Contrast  Result Date: 04/14/2019 CLINICAL DATA:  Altered level of consciousness EXAM: CT HEAD WITHOUT CONTRAST TECHNIQUE: Contiguous axial images were obtained from the base of the skull through the vertex without intravenous contrast. COMPARISON:  08/15/2018 FINDINGS: Brain: No evidence of acute infarction, hemorrhage, hydrocephalus, extra-axial collection or mass lesion/mass effect. Vascular: No hyperdense vessel or unexpected calcification. Skull: Normal. Negative for fracture or focal lesion. Sinuses/Orbits: No acute finding. Other: None. IMPRESSION: No acute intracranial abnormality noted. Electronically Signed   By: Inez Catalina M.D.   On: 04/14/2019 16:35   Ct Angio Chest Pe W/cm &/or Wo Cm  Result Date: 04/14/2019 CLINICAL DATA:  Shortness of breath for several weeks with abdominal pain and fevers EXAM: CT ANGIOGRAPHY CHEST CT ABDOMEN AND PELVIS WITH CONTRAST TECHNIQUE: Multidetector CT imaging of the chest was performed using the standard protocol during bolus administration of intravenous contrast. Multiplanar CT image reconstructions and MIPs were obtained to evaluate the vascular anatomy. Multidetector CT imaging of the abdomen and pelvis was performed using the standard protocol during bolus administration of intravenous contrast. CONTRAST:  128mL OMNIPAQUE IOHEXOL 350 MG/ML SOLN COMPARISON:  None. FINDINGS: CTA CHEST FINDINGS Cardiovascular: Thoracic aorta shows poor opacification  although no aneurysmal dilatation is seen. The heart is at the upper limits of normal in size. Pulmonary artery shows satisfactory central opacification although the peripheral branches are not well opacified. No large central pulmonary embolus  is seen although evaluation of the small peripheral branches is limited. Mediastinum/Nodes: Thoracic inlet is within normal limits. No significant hilar or mediastinal adenopathy is noted. The esophagus as visualized is within normal limits. Lungs/Pleura: Patchy bibasilar infiltrate is seen left slightly greater than right. No sizable effusion is noted. No sizable parenchymal nodules are noted. Musculoskeletal: Mild degenerative changes of the thoracic spine are seen. Review of the MIP images confirms the above findings. CT ABDOMEN and PELVIS FINDINGS Hepatobiliary: No focal liver abnormality is seen. Status post cholecystectomy. No biliary dilatation. Scatter artifact from the patient's arms is noted. Pancreas: Unremarkable. No pancreatic ductal dilatation or surrounding inflammatory changes. Spleen: Normal in size without focal abnormality. Adrenals/Urinary Tract: Adrenal glands are within normal limits. Kidneys demonstrate a normal enhancement pattern. No definitive obstructive changes are seen. Delayed images demonstrate normal excretion of contrast. The bladder is well distended. Stomach/Bowel: Appendix has been surgically removed. There is considerable gaseous distension of the colon involving primarily the transverse and sigmoid colons. No evidence of volvulus is seen. These changes likely represent a colonic ileus as no obstructive lesion is seen. The small bowel is decompressed. Stomach is within normal limits. Vascular/Lymphatic: No significant vascular findings are present. No enlarged abdominal or pelvic lymph nodes. Reproductive: Status post hysterectomy. No adnexal masses. Other: No abdominal wall hernia or abnormality. No abdominopelvic ascites.  Musculoskeletal: Degenerative changes of the lumbar spine are noted. Review of the MIP images confirms the above findings. IMPRESSION: Changes most consistent with a colonic ileus. Correlation with the physical exam is recommended. No large central pulmonary embolism is identified. The contrast opacification the pulmonary arteries is limited. Patchy bibasilar infiltrates left greater than right. Electronically Signed   By: Inez Catalina M.D.   On: 04/14/2019 16:54   Ct Abdomen Pelvis W Contrast  Result Date: 04/14/2019 CLINICAL DATA:  Shortness of breath for several weeks with abdominal pain and fevers EXAM: CT ANGIOGRAPHY CHEST CT ABDOMEN AND PELVIS WITH CONTRAST TECHNIQUE: Multidetector CT imaging of the chest was performed using the standard protocol during bolus administration of intravenous contrast. Multiplanar CT image reconstructions and MIPs were obtained to evaluate the vascular anatomy. Multidetector CT imaging of the abdomen and pelvis was performed using the standard protocol during bolus administration of intravenous contrast. CONTRAST:  130mL OMNIPAQUE IOHEXOL 350 MG/ML SOLN COMPARISON:  None. FINDINGS: CTA CHEST FINDINGS Cardiovascular: Thoracic aorta shows poor opacification although no aneurysmal dilatation is seen. The heart is at the upper limits of normal in size. Pulmonary artery shows satisfactory central opacification although the peripheral branches are not well opacified. No large central pulmonary embolus is seen although evaluation of the small peripheral branches is limited. Mediastinum/Nodes: Thoracic inlet is within normal limits. No significant hilar or mediastinal adenopathy is noted. The esophagus as visualized is within normal limits. Lungs/Pleura: Patchy bibasilar infiltrate is seen left slightly greater than right. No sizable effusion is noted. No sizable parenchymal nodules are noted. Musculoskeletal: Mild degenerative changes of the thoracic spine are seen. Review of the MIP  images confirms the above findings. CT ABDOMEN and PELVIS FINDINGS Hepatobiliary: No focal liver abnormality is seen. Status post cholecystectomy. No biliary dilatation. Scatter artifact from the patient's arms is noted. Pancreas: Unremarkable. No pancreatic ductal dilatation or surrounding inflammatory changes. Spleen: Normal in size without focal abnormality. Adrenals/Urinary Tract: Adrenal glands are within normal limits. Kidneys demonstrate a normal enhancement pattern. No definitive obstructive changes are seen. Delayed images demonstrate normal excretion of contrast. The bladder is well distended. Stomach/Bowel: Appendix has been  surgically removed. There is considerable gaseous distension of the colon involving primarily the transverse and sigmoid colons. No evidence of volvulus is seen. These changes likely represent a colonic ileus as no obstructive lesion is seen. The small bowel is decompressed. Stomach is within normal limits. Vascular/Lymphatic: No significant vascular findings are present. No enlarged abdominal or pelvic lymph nodes. Reproductive: Status post hysterectomy. No adnexal masses. Other: No abdominal wall hernia or abnormality. No abdominopelvic ascites. Musculoskeletal: Degenerative changes of the lumbar spine are noted. Review of the MIP images confirms the above findings. IMPRESSION: Changes most consistent with a colonic ileus. Correlation with the physical exam is recommended. No large central pulmonary embolism is identified. The contrast opacification the pulmonary arteries is limited. Patchy bibasilar infiltrates left greater than right. Electronically Signed   By: Inez Catalina M.D.   On: 04/14/2019 16:54   Dg Chest Portable 1 View  Result Date: 04/14/2019 CLINICAL DATA:  Fever EXAM: PORTABLE CHEST 1 VIEW COMPARISON:  Chest CT July 03, 2016 FINDINGS: There Is mild atelectasis in the left mid lung. There is no evident edema or consolidation. Heart is borderline enlarged with  pulmonary vascularity normal. No adenopathy. No bone lesions. IMPRESSION: Mild atelectasis left mid lung. No edema or consolidation. Heart borderline prominent. No adenopathy evident. Electronically Signed   By: Lowella Grip III M.D.   On: 04/14/2019 11:54    EKG: Independently reviewed. Heart rate 106 bpm, normal axis, normal intervals, sinus rhythm, no ST segment changes no T wave normalities, positive PACs.  Assessment/Plan Principal Problem:   Respiratory failure (HCC) Active Problems:   SUI (stress urinary incontinence, female)   Depression   Generalized anxiety disorder   Convulsion ([place])   Community acquired pneumonia  58 year old female who presents with 2-week history of cough, generalized malaise, fevers and chills, complicated for the last 24 hours with severe dyspnea and hypoxemia. She tested negative for COVID 19 as an outpatient July 7.  On her initial physical examination she was in respiratory distress, her temperature was 101.7 F, blood pressure 127/57, heart rate 104, respiratory rate 17, oxygen saturation 88% on room air.  Dry mucous membranes, lungs with diffuse rales bilaterally more left, no wheezing, heart S1-S2 present and rhythmic, abdomen protuberant and distended, trace lower extremity edema.  Arterial blood gas pH 7.25, PCO2 53.1, PO2 91, bicarb 23, oxygen saturation 94%, sodium 138, potassium 3.7, chloride 102, bicarb 24, glucose 120, creatinine 0.98, white count 10.0, hemoglobin 10.2, hematocrit 34.7, platelets 322, d-dimer 1.79, fibrinogen 534, INR 0.9.  Repeat SARS COVID-19 negative.  Urinalysis 11-20 white cells, more than 50 red cells. Toxicology screen positive for benzodiazepines and opiates.  Chest x-ray with bibasilar infiltrates more left than right, chest CT negative for pulmonary embolism, bibasilar infiltrates more left than right.  CT abdomen with colonic ileus.  CT head negative for acute changes.  Patient will be admitted to the hospital with a  working diagnosis of sepsis due to left lower lobe community-acquired pneumonia, complicated with acute hypoxic and hypercapnic respiratory failure.  1.  Sepsis due to left lower lobe pneumonia (present on admission).  Patient will be admitted to the medical ward, will place her on a remote telemetry monitor, antibiotic therapy with IV ceftriaxone and azithromycin, hydration with balance electrolyte solutions at 75 mill per hour.  Follow-up on cultures, cell count and temperature curve.  She tested negative twice for COVID-19. Check urinary antigens for Legionella pneumonia.  2.  Acute hypercapnic and hypoxic respiratory failure.  Will continue  oxygen supplementation per nasal cannula, keep oxygen saturation greater than 92%.  Cautious with intravenous fluids, follow a restricted IV fluids strategy to avoid volume overload.  Will add as needed bronchodilator with DuoNeb.  3.  Toxic/metabolic encephalopathy. Decreased mentation of sepsis, benzodiazepines and opiates.  Continue neurochecks every 4 hours.  Aspiration precautions.  5.  Depression/anxiety.  For now we will hold on all her psychotropic agents.  Resume once mentation improved. Add dextrose to IV fluids.   DVT prophylaxis: enoxaparin  Code Status:  full  Family Communication: no family at the bedside   Disposition Plan:  Telemetry   Consults called: none   Admission status: Inpatient     Arnold Depinto Gerome Apley MD [facility]   04/14/2019, 5:54 PM

## 2019-04-14 NOTE — ED Notes (Signed)
Report given to sergio on 5w

## 2019-04-14 NOTE — ED Notes (Signed)
To ct

## 2019-04-15 LAB — CBC WITH DIFFERENTIAL/PLATELET
Abs Immature Granulocytes: 0.07 10*3/uL (ref 0.00–0.07)
Basophils Absolute: 0 10*3/uL (ref 0.0–0.1)
Basophils Relative: 0 %
Eosinophils Absolute: 0.1 10*3/uL (ref 0.0–0.5)
Eosinophils Relative: 1 %
HCT: 33.1 % — ABNORMAL LOW (ref 36.0–46.0)
Hemoglobin: 9.8 g/dL — ABNORMAL LOW (ref 12.0–15.0)
Immature Granulocytes: 1 %
Lymphocytes Relative: 14 %
Lymphs Abs: 1.1 10*3/uL (ref 0.7–4.0)
MCH: 25.6 pg — ABNORMAL LOW (ref 26.0–34.0)
MCHC: 29.6 g/dL — ABNORMAL LOW (ref 30.0–36.0)
MCV: 86.4 fL (ref 80.0–100.0)
Monocytes Absolute: 0.3 10*3/uL (ref 0.1–1.0)
Monocytes Relative: 4 %
Neutro Abs: 6.1 10*3/uL (ref 1.7–7.7)
Neutrophils Relative %: 80 %
Platelets: 293 10*3/uL (ref 150–400)
RBC: 3.83 MIL/uL — ABNORMAL LOW (ref 3.87–5.11)
RDW: 15.4 % (ref 11.5–15.5)
WBC: 7.6 10*3/uL (ref 4.0–10.5)
nRBC: 0 % (ref 0.0–0.2)

## 2019-04-15 LAB — BASIC METABOLIC PANEL
Anion gap: 7 (ref 5–15)
Anion gap: 9 (ref 5–15)
BUN: 8 mg/dL (ref 6–20)
BUN: 6 mg/dL (ref 6–20)
CO2: 24 mmol/L (ref 22–32)
CO2: 23 mmol/L (ref 22–32)
Calcium: 7.9 mg/dL — ABNORMAL LOW (ref 8.9–10.3)
Calcium: 8.7 mg/dL — ABNORMAL LOW (ref 8.9–10.3)
Chloride: 109 mmol/L (ref 98–111)
Chloride: 108 mmol/L (ref 98–111)
Creatinine, Ser: 0.73 mg/dL (ref 0.44–1.00)
Creatinine, Ser: 0.72 mg/dL (ref 0.44–1.00)
GFR calc Af Amer: >60 mL/min (ref >60)
GFR calc Af Amer: >60 mL/min (ref >60)
GFR calc non Af Amer: >60 mL/min (ref >60)
GFR calc non Af Amer: >60 mL/min (ref >60)
Glucose, Bld: 339 mg/dL — ABNORMAL HIGH (ref 70–99)
Glucose, Bld: 132 mg/dL — ABNORMAL HIGH (ref 70–99)
Potassium: 3.5 mmol/L (ref 3.5–5.1)
Potassium: 3.6 mmol/L (ref 3.5–5.1)
Sodium: 140 mmol/L (ref 135–145)
Sodium: 140 mmol/L (ref 135–145)

## 2019-04-15 LAB — MAGNESIUM: Magnesium: 2.1 mg/dL (ref 1.7–2.4)

## 2019-04-15 LAB — CBC
HCT: 26.5 % — ABNORMAL LOW (ref 36.0–46.0)
Hemoglobin: 7.9 g/dL — ABNORMAL LOW (ref 12.0–15.0)
MCH: 25.8 pg — ABNORMAL LOW (ref 26.0–34.0)
MCHC: 29.8 g/dL — ABNORMAL LOW (ref 30.0–36.0)
MCV: 86.6 fL (ref 80.0–100.0)
Platelets: 249 10*3/uL (ref 150–400)
RBC: 3.06 MIL/uL — ABNORMAL LOW (ref 3.87–5.11)
RDW: 15.6 % — ABNORMAL HIGH (ref 11.5–15.5)
WBC: 9.2 10*3/uL (ref 4.0–10.5)
nRBC: 0 % (ref 0.0–0.2)

## 2019-04-15 LAB — HIV ANTIBODY (ROUTINE TESTING W REFLEX): HIV Screen 4th Generation wRfx: NONREACTIVE

## 2019-04-15 LAB — STREP PNEUMONIAE URINARY ANTIGEN: Strep Pneumo Urinary Antigen: NEGATIVE

## 2019-04-15 MED ORDER — CEFDINIR 300 MG PO CAPS: 300.0000 mg | ORAL_CAPSULE | Freq: Two times a day (BID) | ORAL | 0 refills | Status: AC

## 2019-04-15 NOTE — Discharge Instructions (Signed)
Community-Acquired Pneumonia, Adult °Pneumonia is an infection of the lungs. It causes swelling in the airways of the lungs. Mucus and fluid may also build up inside the airways. °One type of pneumonia can happen while a person is in a hospital. A different type can happen when a person is not in a hospital (community-acquired pneumonia).  °What are the causes? ° °This condition is caused by germs (viruses, bacteria, or fungi). Some types of germs can be passed from one person to another. This can happen when you breathe in droplets from the cough or sneeze of an infected person. °What increases the risk? °You are more likely to develop this condition if you: °· Have a long-term (chronic) disease, such as: °? Chronic obstructive pulmonary disease (COPD). °? Asthma. °? Cystic fibrosis. °? Congestive heart failure. °? Diabetes. °? Kidney disease. °· Have HIV. °· Have sickle cell disease. °· Have had your spleen removed. °· Do not take good care of your teeth and mouth (poor dental hygiene). °· Have a medical condition that increases the risk of breathing in droplets from your own mouth and nose. °· Have a weakened body defense system (immune system). °· Are a smoker. °· Travel to areas where the germs that cause this illness are common. °· Are around certain animals or the places they live. °What are the signs or symptoms? °· A dry cough. °· A wet (productive) cough. °· Fever. °· Sweating. °· Chest pain. This often happens when breathing deeply or coughing. °· Fast breathing or trouble breathing. °· Shortness of breath. °· Shaking chills. °· Feeling tired (fatigue). °· Muscle aches. °How is this treated? °Treatment for this condition depends on many things. Most adults can be treated at home. In some cases, treatment must happen in a hospital. Treatment may include: °· Medicines given by mouth or through an IV tube. °· Being given extra oxygen. °· Respiratory therapy. °In rare cases, treatment for very bad pneumonia  may include: °· Using a machine to help you breathe. °· Having a procedure to remove fluid from around your lungs. °Follow these instructions at home: °Medicines °· Take over-the-counter and prescription medicines only as told by your doctor. °? Only take cough medicine if you are losing sleep. °· If you were prescribed an antibiotic medicine, take it as told by your doctor. Do not stop taking the antibiotic even if you start to feel better. °General instructions ° °· Sleep with your head and neck raised (elevated). You can do this by sleeping in a recliner or by putting a few pillows under your head. °· Rest as needed. Get at least 8 hours of sleep each night. °· Drink enough water to keep your pee (urine) pale yellow. °· Eat a healthy diet that includes plenty of vegetables, fruits, whole grains, low-fat dairy products, and lean protein. °· Do not use any products that contain nicotine or tobacco. These include cigarettes, e-cigarettes, and chewing tobacco. If you need help quitting, ask your doctor. °· Keep all follow-up visits as told by your doctor. This is important. °How is this prevented? °A shot (vaccine) can help prevent pneumonia. Shots are often suggested for: °· People older than 56 years of age. °· People older than 56 years of age who: °? Are having cancer treatment. °? Have long-term (chronic) lung disease. °? Have problems with their body's defense system. °You may also prevent pneumonia if you take these actions: °· Get the flu (influenza) shot every year. °· Go to the dentist as   often as told. °· Wash your hands often. If you cannot use soap and water, use hand sanitizer. °Contact a doctor if: °· You have a fever. °· You lose sleep because your cough medicine does not help. °Get help right away if: °· You are short of breath and it gets worse. °· You have more chest pain. °· Your sickness gets worse. This is very serious if: °? You are an older adult. °? Your body's defense system is weak. °· You  cough up blood. °Summary °· Pneumonia is an infection of the lungs. °· Most adults can be treated at home. Some will need treatment in a hospital. °· Drink enough water to keep your pee pale yellow. °· Get at least 8 hours of sleep each night. °This information is not intended to replace advice given to you by your health care provider. Make sure you discuss any questions you have with your health care provider. °Document Released: 03/08/2008 Document Revised: 01/10/2019 Document Reviewed: 05/18/2018 °Elsevier Patient Education © 2020 [employer] ° °

## 2019-04-15 NOTE — Progress Notes (Signed)
Willette Cluster to be D/C'd home per MD order. Discussed with the patient and all questions fully answered.   VVS, Skin clean, dry and intact without evidence of skin break down, no evidence of skin tears noted.  IV catheter discontinued intact. Site without signs and symptoms of complications. Dressing and pressure applied.  An After Visit Summary was printed and given to the patient.  Patient escorted via [place], and D/C home via private auto.  Tama [place]  04/15/2019 1:51 PM

## 2019-04-15 NOTE — Discharge Summary (Signed)
Physician Discharge Summary  Tammy [name] JHE:[id number] DOB: 02/14/1961 DOA: 04/14/2019  PCP: Fanny Bien, MD  Admit date: 04/14/2019 Discharge date: 04/15/2019  Admitted From: Home Disposition: Home  Recommendations for Outpatient Follow-up:  1. Follow up with PCP in 1-2 weeks 2. Please obtain BMP/CBC in one week 3. Please follow up on the following pending results:  Home Health: None Equipment/Devices: None  Discharge Condition: Stable CODE STATUS: Full code Diet recommendation: Regular  Subjective: Patient seen and examined this morning.  She was off of the oxygen since last night.  She stated that she is feeling much better.  She was eager to go home and wanted me to discharge her right the next minute.  I offered her to be seen by PT OT but she mentioned that " I do not think I need that".  I did tell her that her hemoglobin has dropped significantly and that I would like to repeat CBC to make sure that is not an error.  She was okay with that as long as I discharge her right after that.  Brief/Interim Summary: Tammy [name] is a 58 y.o. female with medical history significant of obesity, depression and anxiety.  She reported cough for the last 2 weeks, dry, associated with fever, malaise and generalized weakness.  She had a COVID-19 test done on July 7 which turned negative.  Her symptoms continued to deteriorate, last night she had severe dyspnea and decreased mentation.  Her dyspnea was persistent, no improving or worsening factors, severe in intensity.  EMS was called and she was found hypoxic.  Denies any nausea, vomiting or diarrhea. She had no antibiotic prescribed as an outpatient.  No history of pneumonia in the past.   In the emergency department she was in respiratory distress, hypoxic, required 6 L/min per nasal cannula, lung imaging has shown patchy bilateral infiltrates.  She received antibiotics and antipyretics.  Her oxygen requirements decreased down to 2 L/min  per nasal cannula.  She was admitted by hospital service and started on IV Rocephin and Zithromax.  Patient seen and examined this morning for the first time by me.  She was not on oxygen.  She was completely alert and oriented.  She tells me that she has been off of oxygen since last night and in fact went ahead to ask me to discharge her and was very adamant to go home right away.  I offered her to be evaluated by PT OT and perhaps staying overnight or at least few more hours but she was insistent on discharging her right away.  Her morning CBC showed significant drop of hemoglobin so I repeated CBC which shows hemoglobin of 9.8, stable compared to what she came in with yesterday.  All the cultures are negative so far and she was tested negative for COVID once again.  She is going to be sent home on Omnicef 300 mg twice daily for 7 days.  Follow with PCP within a week.  Discharge Diagnoses:  Principal Problem:   Respiratory failure ([place]) Active Problems:   SUI (stress urinary incontinence, female)   Depression   Generalized anxiety disorder   Convulsion ([place])   Community acquired pneumonia    Discharge Instructions  Discharge Instructions    Discharge patient   Complete by: As directed    Discharge disposition: 01-Home or Self Care   Discharge patient date: 04/15/2019     Allergies as of 04/15/2019      Reactions   Nsaids Other (  See Comments)   Post Gastric Bypass   Adhesive [tape] Itching, Rash   Latex Itching, Rash   Sulfa Antibiotics Itching, Rash      Medication List    TAKE these medications   acetaminophen 325 MG tablet Commonly known as: TYLENOL Take 650 mg by mouth every 6 (six) hours as needed for mild pain or fever.   buPROPion 300 MG 24 hr tablet Commonly known as: WELLBUTRIN XL Take 300 mg by mouth daily.   cefdinir 300 MG capsule Commonly known as: OMNICEF Take 1 capsule (300 mg total) by mouth 2 (two) times daily for 7 days.   clonazePAM 1 MG  tablet Commonly known as: KLONOPIN Take 1 mg by mouth 3 (three) times daily as needed for anxiety.   FLUoxetine 20 MG capsule Commonly known as: PROZAC Take 20 mg by mouth at bedtime.   gabapentin 600 MG tablet Commonly known as: NEURONTIN Take 1,200 mg by mouth 3 (three) times daily.   OLANZapine 10 MG tablet Commonly known as: ZYPREXA Take 10 mg by mouth at bedtime.      Follow-up Information    Fanny Bien, MD Follow up in 1 week(s).   Specialty: Family Medicine Contact information: [address] [place] [address] [phone]          Allergies  Allergen Reactions  . Nsaids Other (See Comments)    Post Gastric Bypass  . Adhesive [Tape] Itching and Rash  . Latex Itching and Rash  . Sulfa Antibiotics Itching and Rash    Consultations: None   Procedures/Studies: Ct Head Wo Contrast  Result Date: 04/14/2019 CLINICAL DATA:  Altered level of consciousness EXAM: CT HEAD WITHOUT CONTRAST TECHNIQUE: Contiguous axial images were obtained from the base of the skull through the vertex without intravenous contrast. COMPARISON:  08/15/2018 FINDINGS: Brain: No evidence of acute infarction, hemorrhage, hydrocephalus, extra-axial collection or mass lesion/mass effect. Vascular: No hyperdense vessel or unexpected calcification. Skull: Normal. Negative for fracture or focal lesion. Sinuses/Orbits: No acute finding. Other: None. IMPRESSION: No acute intracranial abnormality noted. Electronically Signed   By: Inez Catalina M.D.   On: 04/14/2019 16:35   Ct Angio Chest Pe W/cm &/or Wo Cm  Result Date: 04/14/2019 CLINICAL DATA:  Shortness of breath for several weeks with abdominal pain and fevers EXAM: CT ANGIOGRAPHY CHEST CT ABDOMEN AND PELVIS WITH CONTRAST TECHNIQUE: Multidetector CT imaging of the chest was performed using the standard protocol during bolus administration of intravenous contrast. Multiplanar CT image reconstructions and MIPs were obtained to evaluate  the vascular anatomy. Multidetector CT imaging of the abdomen and pelvis was performed using the standard protocol during bolus administration of intravenous contrast. CONTRAST:  14mL OMNIPAQUE IOHEXOL 350 MG/ML SOLN COMPARISON:  None. FINDINGS: CTA CHEST FINDINGS Cardiovascular: Thoracic aorta shows poor opacification although no aneurysmal dilatation is seen. The heart is at the upper limits of normal in size. Pulmonary artery shows satisfactory central opacification although the peripheral branches are not well opacified. No large central pulmonary embolus is seen although evaluation of the small peripheral branches is limited. Mediastinum/Nodes: Thoracic inlet is within normal limits. No significant hilar or mediastinal adenopathy is noted. The esophagus as visualized is within normal limits. Lungs/Pleura: Patchy bibasilar infiltrate is seen left slightly greater than right. No sizable effusion is noted. No sizable parenchymal nodules are noted. Musculoskeletal: Mild degenerative changes of the thoracic spine are seen. Review of the MIP images confirms the above findings. CT ABDOMEN and PELVIS FINDINGS Hepatobiliary: No  focal liver abnormality is seen. Status post cholecystectomy. No biliary dilatation. Scatter artifact from the patient's arms is noted. Pancreas: Unremarkable. No pancreatic ductal dilatation or surrounding inflammatory changes. Spleen: Normal in size without focal abnormality. Adrenals/Urinary Tract: Adrenal glands are within normal limits. Kidneys demonstrate a normal enhancement pattern. No definitive obstructive changes are seen. Delayed images demonstrate normal excretion of contrast. The bladder is well distended. Stomach/Bowel: Appendix has been surgically removed. There is considerable gaseous distension of the colon involving primarily the transverse and sigmoid colons. No evidence of volvulus is seen. These changes likely represent a colonic ileus as no obstructive lesion is seen. The  small bowel is decompressed. Stomach is within normal limits. Vascular/Lymphatic: No significant vascular findings are present. No enlarged abdominal or pelvic lymph nodes. Reproductive: Status post hysterectomy. No adnexal masses. Other: No abdominal wall hernia or abnormality. No abdominopelvic ascites. Musculoskeletal: Degenerative changes of the lumbar spine are noted. Review of the MIP images confirms the above findings. IMPRESSION: Changes most consistent with a colonic ileus. Correlation with the physical exam is recommended. No large central pulmonary embolism is identified. The contrast opacification the pulmonary arteries is limited. Patchy bibasilar infiltrates left greater than right. Electronically Signed   By: Inez Catalina M.D.   On: 04/14/2019 16:54   Ct Abdomen Pelvis W Contrast  Result Date: 04/14/2019 CLINICAL DATA:  Shortness of breath for several weeks with abdominal pain and fevers EXAM: CT ANGIOGRAPHY CHEST CT ABDOMEN AND PELVIS WITH CONTRAST TECHNIQUE: Multidetector CT imaging of the chest was performed using the standard protocol during bolus administration of intravenous contrast. Multiplanar CT image reconstructions and MIPs were obtained to evaluate the vascular anatomy. Multidetector CT imaging of the abdomen and pelvis was performed using the standard protocol during bolus administration of intravenous contrast. CONTRAST:  168mL OMNIPAQUE IOHEXOL 350 MG/ML SOLN COMPARISON:  None. FINDINGS: CTA CHEST FINDINGS Cardiovascular: Thoracic aorta shows poor opacification although no aneurysmal dilatation is seen. The heart is at the upper limits of normal in size. Pulmonary artery shows satisfactory central opacification although the peripheral branches are not well opacified. No large central pulmonary embolus is seen although evaluation of the small peripheral branches is limited. Mediastinum/Nodes: Thoracic inlet is within normal limits. No significant hilar or mediastinal adenopathy is  noted. The esophagus as visualized is within normal limits. Lungs/Pleura: Patchy bibasilar infiltrate is seen left slightly greater than right. No sizable effusion is noted. No sizable parenchymal nodules are noted. Musculoskeletal: Mild degenerative changes of the thoracic spine are seen. Review of the MIP images confirms the above findings. CT ABDOMEN and PELVIS FINDINGS Hepatobiliary: No focal liver abnormality is seen. Status post cholecystectomy. No biliary dilatation. Scatter artifact from the patient's arms is noted. Pancreas: Unremarkable. No pancreatic ductal dilatation or surrounding inflammatory changes. Spleen: Normal in size without focal abnormality. Adrenals/Urinary Tract: Adrenal glands are within normal limits. Kidneys demonstrate a normal enhancement pattern. No definitive obstructive changes are seen. Delayed images demonstrate normal excretion of contrast. The bladder is well distended. Stomach/Bowel: Appendix has been surgically removed. There is considerable gaseous distension of the colon involving primarily the transverse and sigmoid colons. No evidence of volvulus is seen. These changes likely represent a colonic ileus as no obstructive lesion is seen. The small bowel is decompressed. Stomach is within normal limits. Vascular/Lymphatic: No significant vascular findings are present. No enlarged abdominal or pelvic lymph nodes. Reproductive: Status post hysterectomy. No adnexal masses. Other: No abdominal wall hernia or abnormality. No abdominopelvic ascites. Musculoskeletal: Degenerative changes of the  lumbar spine are noted. Review of the MIP images confirms the above findings. IMPRESSION: Changes most consistent with a colonic ileus. Correlation with the physical exam is recommended. No large central pulmonary embolism is identified. The contrast opacification the pulmonary arteries is limited. Patchy bibasilar infiltrates left greater than right. Electronically Signed   By: Inez Catalina  M.D.   On: 04/14/2019 16:54   Dg Chest Portable 1 View  Result Date: 04/14/2019 CLINICAL DATA:  Fever EXAM: PORTABLE CHEST 1 VIEW COMPARISON:  Chest CT July 03, 2016 FINDINGS: There Is mild atelectasis in the left mid lung. There is no evident edema or consolidation. Heart is borderline enlarged with pulmonary vascularity normal. No adenopathy. No bone lesions. IMPRESSION: Mild atelectasis left mid lung. No edema or consolidation. Heart borderline prominent. No adenopathy evident. Electronically Signed   By: Lowella Grip III M.D.   On: 04/14/2019 11:54      Discharge Exam: Vitals:   04/15/19 0530 04/15/19 0916  BP: 135/74   Pulse: 77 75  Resp: 18   Temp: 98.1 F (36.7 C)   SpO2: 96% 99%   Vitals:   04/14/19 1938 04/14/19 2056 04/15/19 0530 04/15/19 0916  BP:  128/68 135/74   Pulse:  72 77 75  Resp:  19 18   Temp: 97.9 F (36.6 C) 98.7 F (37.1 C) 98.1 F (36.7 C)   TempSrc:  Oral Oral   SpO2:  95% 96% 99%  Weight:      Height:        General: Pt is alert, awake, not in acute distress Cardiovascular: RRR, S1/S2 +, no rubs, no gallops Respiratory: CTA bilaterally, no wheezing, no rhonchi Abdominal: Soft, NT, ND, bowel sounds + Extremities: no edema, no cyanosis    The results of significant diagnostics from this hospitalization (including imaging, microbiology, ancillary and laboratory) are listed below for reference.     Microbiology: Recent Results (from the past 240 hour(s))  Novel Coronavirus, NAA ([facility])     Status: None   Collection Time: 04/10/19  3:00 PM  Result Value Ref Range Status   SARS-CoV-2, NAA Not Detected Not Detected Final    Comment: This test was developed and its performance characteristics determined by [employer]. This test has not been FDA cleared or approved. This test has been authorized by FDA under an Emergency Use Authorization (EUA). This test is only authorized for the duration of time the declaration that  circumstances exist justifying the authorization of the emergency use of in vitro diagnostic tests for detection of SARS-CoV-2 virus and/or diagnosis of COVID-19 infection under section 564(b)(1) of the Act, 21 U.S.C. 329JME-2(A)(8), unless the authorization is terminated or revoked sooner. When diagnostic testing is negative, the possibility of a false negative result should be considered in the context of a patient's recent exposures and the presence of clinical signs and symptoms consistent with COVID-19. An individual without symptoms of COVID-19 and who is not shedding SARS-CoV-2 virus would expect to have a negative (not detected) result in this assay.   SARS Coronavirus 2 (CEPHEID - Performed in [place] hospital lab), Hosp Order     Status: None   Collection Time: 04/14/19 11:09 AM   Specimen: Urine, Catheterized; Nasopharyngeal  Result Value Ref Range Status   SARS Coronavirus 2 NEGATIVE NEGATIVE Final    Comment: (NOTE) If result is NEGATIVE SARS-CoV-2 target nucleic acids are NOT DETECTED. The SARS-CoV-2 RNA is generally detectable in upper and lower  respiratory specimens during the acute phase  of infection. The lowest  concentration of SARS-CoV-2 viral copies this assay can detect is 250  copies / mL. A negative result does not preclude SARS-CoV-2 infection  and should not be used as the sole basis for treatment or other  patient management decisions.  A negative result may occur with  improper specimen collection / handling, submission of specimen other  than nasopharyngeal swab, presence of viral mutation(s) within the  areas targeted by this assay, and inadequate number of viral copies  (<250 copies / mL). A negative result must be combined with clinical  observations, patient history, and epidemiological information. If result is POSITIVE SARS-CoV-2 target nucleic acids are DETECTED. The SARS-CoV-2 RNA is generally detectable in upper and lower  respiratory  specimens dur ing the acute phase of infection.  Positive  results are indicative of active infection with SARS-CoV-2.  Clinical  correlation with patient history and other diagnostic information is  necessary to determine patient infection status.  Positive results do  not rule out bacterial infection or co-infection with other viruses. If result is PRESUMPTIVE POSTIVE SARS-CoV-2 nucleic acids MAY BE PRESENT.   A presumptive positive result was obtained on the submitted specimen  and confirmed on repeat testing.  While 2019 novel coronavirus  (SARS-CoV-2) nucleic acids may be present in the submitted sample  additional confirmatory testing may be necessary for epidemiological  and / or clinical management purposes  to differentiate between  SARS-CoV-2 and other Sarbecovirus currently known to infect humans.  If clinically indicated additional testing with an alternate test  methodology [phone]) is advised. The SARS-CoV-2 RNA is generally  detectable in upper and lower respiratory sp ecimens during the acute  phase of infection. The expected result is Negative. Fact Sheet for Patients:  StrictlyIdeas.no Fact Sheet for Healthcare Providers: BankingDealers.co.za This test is not yet approved or cleared by the [place] FDA and has been authorized for detection and/or diagnosis of SARS-CoV-2 by FDA under an Emergency Use Authorization (EUA).  This EUA will remain in effect (meaning this test can be used) for the duration of the COVID-19 declaration under Section 564(b)(1) of the Act, 21 U.S.C. section 360bbb-3(b)(1), unless the authorization is terminated or revoked sooner. Performed at [facility], [place] [address] [place], [place] [address]   Urine culture     Status: Abnormal (Preliminary result)   Collection Time: 04/14/19 11:09 AM   Specimen: Urine, Catheterized  Result Value Ref Range Status   Specimen Description URINE,  CATHETERIZED  Final   Special Requests   Final    NONE Performed at [facility], [place] [address], [place],  [address]    Culture >=100,000 COLONIES/mL GRAM NEGATIVE RODS (A)  Final   Report Status PENDING  Incomplete     Labs: BNP (last 3 results) No results for input(s): BNP in the last 8760 hours. Basic Metabolic Panel: Recent Labs  Lab 04/14/19 1057 04/14/19 1232 04/15/19 0332 04/15/19 1038  NA 138 140 140 140  K 3.7 3.6 3.5 3.6  CL 102  --  109 108  CO2 24  --  24 23  GLUCOSE 120*  --  339* 132*  BUN 11  --  8 6  CREATININE 0.98  --  0.73 0.72  CALCIUM 8.9  --  7.9* 8.7*  MG  --   --   --  2.1   Liver Function Tests: Recent Labs  Lab 04/14/19 1057  AST 18  ALT 15  ALKPHOS 92  BILITOT 0.6  PROT 7.2  ALBUMIN 3.4*   No results for input(s): LIPASE, AMYLASE in the last 168 hours. Recent Labs  Lab 04/14/19 1307  AMMONIA 21   CBC: Recent Labs  Lab 04/14/19 1057 04/14/19 1232 04/15/19 0332 04/15/19 1038  WBC 10.0  --  9.2 7.6  NEUTROABS 8.4*  --   --  6.1  HGB 10.2* 10.9* 7.9* 9.8*  HCT 34.7* 32.0* 26.5* 33.1*  MCV 86.8  --  86.6 86.4  PLT 322  --  249 293   Cardiac Enzymes: No results for input(s): CKTOTAL, CKMB, CKMBINDEX, TROPONINI in the last 168 hours. BNP: Invalid input(s): POCBNP CBG: No results for input(s): GLUCAP in the last 168 hours. D-Dimer Recent Labs    04/14/19 1307  DDIMER 1.79*   Hgb A1c No results for input(s): HGBA1C in the last 72 hours. Lipid Profile Recent Labs    04/14/19 1307  TRIG 67   Thyroid function studies Recent Labs    04/14/19 1307  TSH 1.115   Anemia work up Recent Labs    04/14/19 1307  FERRITIN 16   Urinalysis    Component Value Date/Time   COLORURINE YELLOW 04/14/2019 1138   APPEARANCEUR CLOUDY (A) 04/14/2019 1138   LABSPEC 1.023 04/14/2019 1138   PHURINE 5.0 04/14/2019 1138   GLUCOSEU NEGATIVE 04/14/2019 1138   HGBUR MODERATE (A) 04/14/2019 1138   BILIRUBINUR SMALL (A)  04/14/2019 1138   KETONESUR NEGATIVE 04/14/2019 1138   PROTEINUR 30 (A) 04/14/2019 1138   UROBILINOGEN 1.0 03/13/2017 1637   NITRITE NEGATIVE 04/14/2019 1138   LEUKOCYTESUR MODERATE (A) 04/14/2019 1138   Sepsis Labs Invalid input(s): PROCALCITONIN,  WBC,  LACTICIDVEN Microbiology Recent Results (from the past 240 hour(s))  Novel Coronavirus, NAA ([facility])     Status: None   Collection Time: 04/10/19  3:00 PM  Result Value Ref Range Status   SARS-CoV-2, NAA Not Detected Not Detected Final    Comment: This test was developed and its performance characteristics determined by [employer]. This test has not been FDA cleared or approved. This test has been authorized by FDA under an Emergency Use Authorization (EUA). This test is only authorized for the duration of time the declaration that circumstances exist justifying the authorization of the emergency use of in vitro diagnostic tests for detection of SARS-CoV-2 virus and/or diagnosis of COVID-19 infection under section 564(b)(1) of the Act, 21 U.S.C. 062BJS-2(G)(3), unless the authorization is terminated or revoked sooner. When diagnostic testing is negative, the possibility of a false negative result should be considered in the context of a patient's recent exposures and the presence of clinical signs and symptoms consistent with COVID-19. An individual without symptoms of COVID-19 and who is not shedding SARS-CoV-2 virus would expect to have a negative (not detected) result in this assay.   SARS Coronavirus 2 (CEPHEID - Performed in [place] hospital lab), Hosp Order     Status: None   Collection Time: 04/14/19 11:09 AM   Specimen: Urine, Catheterized; Nasopharyngeal  Result Value Ref Range Status   SARS Coronavirus 2 NEGATIVE NEGATIVE Final    Comment: (NOTE) If result is NEGATIVE SARS-CoV-2 target nucleic acids are NOT DETECTED. The SARS-CoV-2 RNA is generally detectable in upper and lower  respiratory specimens  during the acute phase of infection. The lowest  concentration of SARS-CoV-2 viral copies this assay can detect is 250  copies / mL. A negative result does not preclude SARS-CoV-2 infection  and should not be used as the sole  basis for treatment or other  patient management decisions.  A negative result may occur with  improper specimen collection / handling, submission of specimen other  than nasopharyngeal swab, presence of viral mutation(s) within the  areas targeted by this assay, and inadequate number of viral copies  (<250 copies / mL). A negative result must be combined with clinical  observations, patient history, and epidemiological information. If result is POSITIVE SARS-CoV-2 target nucleic acids are DETECTED. The SARS-CoV-2 RNA is generally detectable in upper and lower  respiratory specimens dur ing the acute phase of infection.  Positive  results are indicative of active infection with SARS-CoV-2.  Clinical  correlation with patient history and other diagnostic information is  necessary to determine patient infection status.  Positive results do  not rule out bacterial infection or co-infection with other viruses. If result is PRESUMPTIVE POSTIVE SARS-CoV-2 nucleic acids MAY BE PRESENT.   A presumptive positive result was obtained on the submitted specimen  and confirmed on repeat testing.  While 2019 novel coronavirus  (SARS-CoV-2) nucleic acids may be present in the submitted sample  additional confirmatory testing may be necessary for epidemiological  and / or clinical management purposes  to differentiate between  SARS-CoV-2 and other Sarbecovirus currently known to infect humans.  If clinically indicated additional testing with an alternate test  methodology [phone]) is advised. The SARS-CoV-2 RNA is generally  detectable in upper and lower respiratory sp ecimens during the acute  phase of infection. The expected result is Negative. Fact Sheet for Patients:   StrictlyIdeas.no Fact Sheet for Healthcare Providers: BankingDealers.co.za This test is not yet approved or cleared by the [place] FDA and has been authorized for detection and/or diagnosis of SARS-CoV-2 by FDA under an Emergency Use Authorization (EUA).  This EUA will remain in effect (meaning this test can be used) for the duration of the COVID-19 declaration under Section 564(b)(1) of the Act, 21 U.S.C. section 360bbb-3(b)(1), unless the authorization is terminated or revoked sooner. Performed at [facility], [place] [address] [place], [place] [address]   Urine culture     Status: Abnormal (Preliminary result)   Collection Time: 04/14/19 11:09 AM   Specimen: Urine, Catheterized  Result Value Ref Range Status   Specimen Description URINE, CATHETERIZED  Final   Special Requests   Final    NONE Performed at [facility], [place] [address], [place],  [address]    Culture >=100,000 COLONIES/mL GRAM NEGATIVE RODS (A)  Final   Report Status PENDING  Incomplete     Time coordinating discharge: 29 minutes  SIGNED:   Darliss Cheney, MD  [facility] 04/15/2019, 11:40 AM Pager [phone]  If 7PM-7AM, please contact night-coverage [email or web address] Password [email or web address]

## 2019-04-16 LAB — LEGIONELLA PNEUMOPHILA SEROGP 1 UR AG: L. pneumophila Serogp 1 Ur Ag: NEGATIVE

## 2019-04-16 LAB — URINE CULTURE: Culture: >=100000 — AB

## 2019-04-19 ENCOUNTER — Telehealth: Payer: Self-pay | Admitting: General Practice

## 2019-04-19 LAB — CULTURE, BLOOD (ROUTINE X 2)
Culture: NO GROWTH
Culture: NO GROWTH
Special Requests: ADEQUATE

## 2019-04-19 NOTE — Telephone Encounter (Signed)
Pt notified of Negative COVID result

## 2019-07-30 ENCOUNTER — Encounter (HOSPITAL_COMMUNITY): Payer: Self-pay

## 2019-07-30 ENCOUNTER — Other Ambulatory Visit: Payer: Self-pay

## 2019-07-30 ENCOUNTER — Emergency Department (HOSPITAL_COMMUNITY): Payer: Self-pay

## 2019-07-30 ENCOUNTER — Inpatient Hospital Stay (HOSPITAL_COMMUNITY)
Admission: EM | Admit: 2019-07-30 | Discharge: 2019-08-02 | DRG: 871 | Disposition: A | Discharge status: Home / Self Care | Payer: Self-pay | Attending: Internal Medicine | Admitting: Internal Medicine

## 2019-07-30 DIAGNOSIS — Z8249 Family history of ischemic heart disease and other diseases of the circulatory system: Secondary | ICD-10-CM

## 2019-07-30 DIAGNOSIS — M542 Cervicalgia: Secondary | ICD-10-CM | POA: Diagnosis present

## 2019-07-30 DIAGNOSIS — F411 Generalized anxiety disorder: Secondary | ICD-10-CM | POA: Diagnosis present

## 2019-07-30 DIAGNOSIS — Z6841 Body Mass Index (BMI) 40.0 and over, adult: Secondary | ICD-10-CM

## 2019-07-30 DIAGNOSIS — Z91040 Latex allergy status: Secondary | ICD-10-CM

## 2019-07-30 DIAGNOSIS — R197 Diarrhea, unspecified: Secondary | ICD-10-CM | POA: Diagnosis not present

## 2019-07-30 DIAGNOSIS — I480 Paroxysmal atrial fibrillation: Secondary | ICD-10-CM | POA: Diagnosis present

## 2019-07-30 DIAGNOSIS — R030 Elevated blood-pressure reading, without diagnosis of hypertension: Secondary | ICD-10-CM | POA: Diagnosis present

## 2019-07-30 DIAGNOSIS — F32A Depression, unspecified: Secondary | ICD-10-CM | POA: Diagnosis present

## 2019-07-30 DIAGNOSIS — Z23 Encounter for immunization: Secondary | ICD-10-CM

## 2019-07-30 DIAGNOSIS — M5136 Other intervertebral disc degeneration, lumbar region: Secondary | ICD-10-CM | POA: Diagnosis present

## 2019-07-30 DIAGNOSIS — F419 Anxiety disorder, unspecified: Secondary | ICD-10-CM | POA: Diagnosis present

## 2019-07-30 DIAGNOSIS — F329 Major depressive disorder, single episode, unspecified: Secondary | ICD-10-CM | POA: Diagnosis present

## 2019-07-30 DIAGNOSIS — Z9884 Bariatric surgery status: Secondary | ICD-10-CM

## 2019-07-30 DIAGNOSIS — I248 Other forms of acute ischemic heart disease: Secondary | ICD-10-CM | POA: Diagnosis present

## 2019-07-30 DIAGNOSIS — J189 Pneumonia, unspecified organism: Secondary | ICD-10-CM | POA: Diagnosis present

## 2019-07-30 DIAGNOSIS — J9690 Respiratory failure, unspecified, unspecified whether with hypoxia or hypercapnia: Secondary | ICD-10-CM | POA: Diagnosis present

## 2019-07-30 DIAGNOSIS — Z9181 History of falling: Secondary | ICD-10-CM

## 2019-07-30 DIAGNOSIS — A419 Sepsis, unspecified organism: Principal | ICD-10-CM | POA: Diagnosis present

## 2019-07-30 DIAGNOSIS — I4891 Unspecified atrial fibrillation: Secondary | ICD-10-CM | POA: Diagnosis present

## 2019-07-30 DIAGNOSIS — M25531 Pain in right wrist: Secondary | ICD-10-CM | POA: Diagnosis present

## 2019-07-30 DIAGNOSIS — Z833 Family history of diabetes mellitus: Secondary | ICD-10-CM

## 2019-07-30 DIAGNOSIS — J9601 Acute respiratory failure with hypoxia: Secondary | ICD-10-CM | POA: Diagnosis present

## 2019-07-30 DIAGNOSIS — Z20828 Contact with and (suspected) exposure to other viral communicable diseases: Secondary | ICD-10-CM | POA: Diagnosis present

## 2019-07-30 DIAGNOSIS — Z882 Allergy status to sulfonamides status: Secondary | ICD-10-CM

## 2019-07-30 LAB — COMPREHENSIVE METABOLIC PANEL
ALT: 18 U/L (ref 0–44)
AST: 23 U/L (ref 15–41)
Albumin: 3.5 g/dL (ref 3.5–5.0)
Alkaline Phosphatase: 84 U/L (ref 38–126)
Anion gap: 8 (ref 5–15)
BUN: 13 mg/dL (ref 6–20)
CO2: 24 mmol/L (ref 22–32)
Calcium: 8.2 mg/dL — ABNORMAL LOW (ref 8.9–10.3)
Chloride: 104 mmol/L (ref 98–111)
Creatinine, Ser: 1.01 mg/dL — ABNORMAL HIGH (ref 0.44–1.00)
GFR calc Af Amer: >60 mL/min (ref >60)
GFR calc non Af Amer: >60 mL/min (ref >60)
Glucose, Bld: 123 mg/dL — ABNORMAL HIGH (ref 70–99)
Potassium: 3.9 mmol/L (ref 3.5–5.1)
Sodium: 136 mmol/L (ref 135–145)
Total Bilirubin: 0.8 mg/dL (ref 0.3–1.2)
Total Protein: 6.9 g/dL (ref 6.5–8.1)

## 2019-07-30 LAB — CBC WITH DIFFERENTIAL/PLATELET
Abs Immature Granulocytes: 0.03 10*3/uL (ref 0.00–0.07)
Basophils Absolute: 0 10*3/uL (ref 0.0–0.1)
Basophils Relative: 0 %
Eosinophils Absolute: 0 10*3/uL (ref 0.0–0.5)
Eosinophils Relative: 0 %
HCT: 33.4 % — ABNORMAL LOW (ref 36.0–46.0)
Hemoglobin: 9.6 g/dL — ABNORMAL LOW (ref 12.0–15.0)
Immature Granulocytes: 0 %
Lymphocytes Relative: 17 %
Lymphs Abs: 1.9 10*3/uL (ref 0.7–4.0)
MCH: 24.2 pg — ABNORMAL LOW (ref 26.0–34.0)
MCHC: 28.7 g/dL — ABNORMAL LOW (ref 30.0–36.0)
MCV: 84.1 fL (ref 80.0–100.0)
Monocytes Absolute: 0.7 10*3/uL (ref 0.1–1.0)
Monocytes Relative: 7 %
Neutro Abs: 8.4 10*3/uL — ABNORMAL HIGH (ref 1.7–7.7)
Neutrophils Relative %: 76 %
Platelets: 342 10*3/uL (ref 150–400)
RBC: 3.97 MIL/uL (ref 3.87–5.11)
RDW: 17.9 % — ABNORMAL HIGH (ref 11.5–15.5)
WBC: 11.1 10*3/uL — ABNORMAL HIGH (ref 4.0–10.5)
nRBC: 0 % (ref 0.0–0.2)

## 2019-07-30 LAB — LACTIC ACID, PLASMA: Lactic Acid, Venous: 1.4 mmol/L (ref 0.5–1.9)

## 2019-07-30 MED ORDER — ACETAMINOPHEN 325 MG PO TABS
650.0000 mg | ORAL_TABLET | Freq: Once | ORAL | Status: AC
  Administered 2019-07-30: 650 mg via ORAL
  Filled 2019-07-30: qty 2

## 2019-07-30 MED ORDER — DILTIAZEM HCL-DEXTROSE 125-5 MG/125ML-% IV SOLN (PREMIX)
5.0000 mg/h | INTRAVENOUS | Status: DC
  Administered 2019-07-31: 12.5 mg/h via INTRAVENOUS
  Administered 2019-07-31: 5 mg/h via INTRAVENOUS
  Administered 2019-07-31: 15 mg/h via INTRAVENOUS
  Filled 2019-07-30 (×4): qty 125

## 2019-07-30 MED ORDER — SODIUM CHLORIDE 0.9 % IV SOLN
1000.0000 mL | INTRAVENOUS | Status: DC
  Administered 2019-07-30 – 2019-08-02 (×8): 1000 mL via INTRAVENOUS

## 2019-07-30 MED ORDER — SODIUM CHLORIDE 0.9 % IV SOLN
500.0000 mg | INTRAVENOUS | Status: DC
  Administered 2019-07-30 – 2019-07-31 (×2): 500 mg via INTRAVENOUS
  Filled 2019-07-30 (×2): qty 500

## 2019-07-30 MED ORDER — SODIUM CHLORIDE 0.9 % IV SOLN
2.0000 g | INTRAVENOUS | Status: DC
  Administered 2019-07-30 – 2019-08-01 (×3): 2 g via INTRAVENOUS
  Filled 2019-07-30 (×3): qty 20
  Filled 2019-07-30: qty 2
  Filled 2019-07-30: qty 20

## 2019-07-30 MED ORDER — DILTIAZEM LOAD VIA INFUSION
15.0000 mg | Freq: Once | INTRAVENOUS | Status: AC
  Administered 2019-07-31: 15 mg via INTRAVENOUS
  Filled 2019-07-30: qty 15

## 2019-07-30 MED ORDER — SODIUM CHLORIDE 0.9 % IV BOLUS (SEPSIS)
1000.0000 mL | Freq: Once | INTRAVENOUS | Status: AC
  Administered 2019-07-30: 1000 mL via INTRAVENOUS

## 2019-07-30 NOTE — ED Notes (Signed)
Lab called to change COVID swab order.

## 2019-07-30 NOTE — ED Notes (Signed)
Pt transported to radiology.

## 2019-07-30 NOTE — ED Notes (Signed)
Pt placed on 1L [place]

## 2019-07-30 NOTE — ED Notes (Signed)
Update given to Husband Tammy [name].

## 2019-07-30 NOTE — ED Provider Notes (Signed)
[facility] DEPT Provider Note   CSN: [id number] Arrival date & time: 07/30/19  2107     History   Chief Complaint Chief Complaint  Patient presents with   Fall   Fever    HPI Tammy [name] is a 58 y.o. female.     HPI Patient presents to the ED for evaluation of a fall.  Patient states she has been a bit fatigued since yesterday but she was up and walking around.  Patient slipped out of her bed and landed on her bottom.  She had pain in her neck and back as well as her right wrist.  EMS was called because of her fall.  Patient had a c-collar placed.  She was put on nasal cannula oxygen because her saturations were low.  Patient denied having any fevers although she had a mild cough at home.  She denied any trouble with abdominal pain.  No vomiting or diarrhea.  No dysuria.  Patient is not normally on oxygen at home. Past Medical History:  Diagnosis Date   Anxiety    Arthritis    neck and lower back - deg disc disease, fingers   Depression    Frequency of urination    GERD (gastroesophageal reflux disease)    History of seizure per pt no seizure since    11-07-2015  in setting of stress and sleep deprivation  /  negative work-up by neurologist unknown idiology   Hypothyroidism    Injury of thumb, left    03-31-2016     Pelvic relaxation    SUI (stress urinary incontinence, female)    Wears glasses     Patient Active Problem List   Diagnosis Date Noted   Respiratory failure ([place]) 04/14/2019   Community acquired pneumonia 04/14/2019   Closed nondisplaced fracture of head of left radius 06/20/2018   Sprain of left wrist 06/20/2018   Peroneal tendinitis, right leg 06/23/2017   Trochanteric bursitis, right hip 06/23/2017   Acute right-sided low back pain with left-sided sciatica 03/24/2017   Pain in right hip 03/24/2017   Impingement syndrome of left shoulder 08/30/2016   Impingement syndrome of right shoulder  08/30/2016   Plantar fasciitis, bilateral 08/30/2016   Pelvic relaxation 04/13/2016    Class: Present on Admission   S/P hysterectomy 04/12/2016   Depression 11/16/2015   Generalized anxiety disorder 11/16/2015   Convulsion ([place]) 11/16/2015   SUI (stress urinary incontinence, female) 09/26/2012    Past Surgical History:  Procedure Laterality Date   ANTERIOR AND POSTERIOR REPAIR  09/25/2012   Procedure: ANTERIOR (CYSTOCELE) AND POSTERIOR REPAIR (RECTOCELE);  Surgeon: Lahoma Crocker, MD;  Location: [facility];  Service: Gynecology;  Laterality: N/A;   ANTERIOR AND POSTERIOR REPAIR WITH SACROSPINOUS FIXATION N/A 04/12/2016   Procedure: ANTERIOR AND POSTERIOR REPAIR WITH SACROSPINOUS LIGAMENT SUSPENSION, CYSTOSCOPY;  Surgeon: Arvella Nigh, MD;  Location: [facility];  Service: Gynecology;  Laterality: N/A;   APPENDECTOMY  1987   BREAST EXCISIONAL BIOPSY Left    No scar visable    BREAST REDUCTION SURGERY  2000  aprrox   ENDOMETRIAL ABLATION W/ NOVASURE  2011   in office   EXCISION LEFT BREAST BX  12-07-1999   benign   LAPAROSCOPIC CHOLECYSTECTOMY  02-09-2005   LAPAROSCOPIC VAGINAL HYSTERECTOMY WITH SALPINGO OOPHORECTOMY Bilateral 04/12/2016   Procedure: LAPAROSCOPIC ASSISTED VAGINAL HYSTERECTOMY WITH SALPINGO OOPHORECTOMY;  Surgeon: Arvella Nigh, MD;  Location: [facility];  Service: Gynecology;  Laterality: Bilateral;   REDUCTION MAMMAPLASTY  Bilateral    ROUX-EN-Y GASTRIC BYPASS  06/  2002   TUBAL LIGATION  10/ 1990     OB History   No obstetric history on file.      Home Medications    Prior to Admission medications   Medication Sig Start Date End Date Taking? Authorizing Provider  acetaminophen (TYLENOL) 325 MG tablet Take 650 mg by mouth every 6 (six) hours as needed for mild pain or fever.   Yes [provider]  buPROPion (WELLBUTRIN XL) 300 MG 24 hr tablet Take 300 mg by mouth daily.  07/26/18  Yes [provider]  clonazePAM (KLONOPIN) 1 MG tablet Take 1 mg by mouth 3 (three) times daily as needed for anxiety. 03/26/19  Yes [provider]  FLUoxetine (PROZAC) 20 MG capsule Take 20 mg by mouth at bedtime.  07/26/18  Yes [provider]  gabapentin (NEURONTIN) 600 MG tablet Take 1,200 mg by mouth 3 (three) times daily. 08/12/18  Yes [provider]  OLANZapine (ZYPREXA) 10 MG tablet Take 10 mg by mouth at bedtime. 04/04/19  Yes [provider]    Family History Family History  Problem Relation Age of Onset   Colon polyps Mother    Breast cancer Maternal Aunt    Stomach cancer Maternal Grandmother    Diabetes Maternal Grandmother    Heart disease Maternal Grandmother    Stomach cancer Maternal Uncle    Colon cancer Maternal Uncle        x3   Diabetes Maternal Aunt        Type I   Diabetes Maternal Uncle        x5 - Type II   Heart disease Paternal Grandmother    Esophageal cancer Neg Hx    Gallbladder disease Neg Hx    Seizures Neg Hx     Social History Social History   Tobacco Use   Smoking status: Never Smoker   Smokeless tobacco: Never Used  Substance Use Topics   Alcohol use: No    Comment: drinks daily - none since 08/2012 per pt.   Drug use: No     Allergies   Nsaids, Adhesive [tape], Latex, and Sulfa antibiotics   Review of Systems Review of Systems  Respiratory: Negative for shortness of breath.   Genitourinary: Negative for dysuria.  All other systems reviewed and are negative.    Physical Exam Updated Vital Signs BP (!) 150/106    Pulse (!) 176    Temp (!) 101.6 F (38.7 C) (Oral)    Resp 16    Ht 1.676 m (5\' 6" )    Wt (!) 149.7 kg    LMP 09/30/2012    SpO2 95%    BMI 53.26 kg/m   Physical Exam Vitals signs and nursing note reviewed.  Constitutional:      General: She is not in acute distress.    Appearance: She is well-developed and overweight.  HENT:     Head: Normocephalic and  atraumatic.     Right Ear: External ear normal.     Left Ear: External ear normal.  Eyes:     General: No scleral icterus.       Right eye: No discharge.        Left eye: No discharge.     Conjunctiva/sclera: Conjunctivae normal.  Neck:     Musculoskeletal: Neck supple.     Trachea: No tracheal deviation.  Cardiovascular:     Rate and Rhythm: Normal rate and  regular rhythm.  Pulmonary:     Effort: Pulmonary effort is normal. No respiratory distress.     Breath sounds: Normal breath sounds. No stridor. No wheezing or rales.  Abdominal:     General: Bowel sounds are normal. There is no distension.     Palpations: Abdomen is soft.     Tenderness: There is no abdominal tenderness. There is no guarding or rebound.  Musculoskeletal:        General: No tenderness.     Right wrist: She exhibits bony tenderness.     Cervical back: She exhibits bony tenderness.     Thoracic back: Normal.     Lumbar back: She exhibits bony tenderness.  Skin:    General: Skin is warm and dry.     Findings: No rash.  Neurological:     Mental Status: She is alert.     Cranial Nerves: No cranial nerve deficit (no facial droop, extraocular movements intact, no slurred speech).     Sensory: No sensory deficit.     Motor: No abnormal muscle tone or seizure activity.     Coordination: Coordination normal.      ED Treatments / Results  Labs (all labs ordered are listed, but only abnormal results are displayed) Labs Reviewed  CBC WITH DIFFERENTIAL/PLATELET - Abnormal; Notable for the following components:      Result Value   WBC 11.1 (*)    Hemoglobin 9.6 (*)    HCT 33.4 (*)    MCH 24.2 (*)    MCHC 28.7 (*)    RDW 17.9 (*)    Neutro Abs 8.4 (*)    All other components within normal limits  CULTURE, BLOOD (ROUTINE X 2)  CULTURE, BLOOD (ROUTINE X 2)  URINE CULTURE  SARS CORONAVIRUS 2 BY RT PCR (HOSPITAL ORDER, [facility])  LACTIC ACID, PLASMA  LACTIC ACID, PLASMA    COMPREHENSIVE METABOLIC PANEL  APTT  PROTIME-INR  URINALYSIS, ROUTINE W REFLEX MICROSCOPIC    EKG EKG Interpretation  Date/Time:  Monday July 30 2019 22:53:59 EDT Ventricular Rate:  118 PR Interval:    QRS Duration: 91 QT Interval:  352 QTC Calculation: 494 R Axis:   60 Text Interpretation: Sinus tachycardia Probable left atrial enlargement Low voltage, precordial leads Borderline T wave abnormalities Borderline prolonged QT interval Confirmed by Veryl Speak [phone]) on 07/30/2019 11:07:58 PM   Radiology Dg Lumbar Spine Complete  Result Date: 07/30/2019 CLINICAL DATA:  Fall EXAM: LUMBAR SPINE - COMPLETE 4+ VIEW COMPARISON:  None. FINDINGS: There is no evidence of lumbar spine fracture. Alignment is normal. Disc space narrowing is greatest at L4-5. IMPRESSION: No fracture or listhesis of the lumbar spine. Degenerative disc disease greatest at L4-5. Electronically Signed   By: Ulyses Jarred M.D.   On: 07/30/2019 22:54   Dg Wrist Complete Right  Result Date: 07/30/2019 CLINICAL DATA:  Fall EXAM: RIGHT WRIST - COMPLETE 3+ VIEW COMPARISON:  None. FINDINGS: There is no evidence of fracture or dislocation. There is no evidence of arthropathy or other focal bone abnormality. Soft tissues are unremarkable. IMPRESSION: Negative. Electronically Signed   By: Ulyses Jarred M.D.   On: 07/30/2019 22:53   Ct Head Wo Contrast  Result Date: 07/30/2019 CLINICAL DATA:  Fall EXAM: CT HEAD WITHOUT CONTRAST CT CERVICAL SPINE WITHOUT CONTRAST TECHNIQUE: Multidetector CT imaging of the head and cervical spine was performed following the standard protocol without intravenous contrast. Multiplanar CT image reconstructions of the cervical spine were also generated.  COMPARISON:  04/14/2019 head CT FINDINGS: CT HEAD FINDINGS Brain: There is no mass, hemorrhage or extra-axial collection. The size and configuration of the ventricles and extra-axial CSF spaces are normal. The brain parenchyma is normal,  without evidence of acute or chronic infarction. Vascular: No abnormal hyperdensity of the major intracranial arteries or dural venous sinuses. No intracranial atherosclerosis. Skull: The visualized skull base, calvarium and extracranial soft tissues are normal. Sinuses/Orbits: No fluid levels or advanced mucosal thickening of the visualized paranasal sinuses. No mastoid or middle ear effusion. The orbits are normal. CT CERVICAL SPINE FINDINGS Alignment: No static subluxation. Facets are aligned. Occipital condyles are normally positioned. Skull base and vertebrae: No acute fracture. Soft tissues and spinal canal: No prevertebral fluid or swelling. No visible canal hematoma. Disc levels: No advanced spinal canal or neural foraminal stenosis. Upper chest: No pneumothorax, pulmonary nodule or pleural effusion. Other: Normal visualized paraspinal cervical soft tissues. IMPRESSION: 1. No acute intracranial abnormality. 2. No acute fracture or static subluxation of the cervical spine. Electronically Signed   By: Ulyses Jarred M.D.   On: 07/30/2019 23:00   Ct Cervical Spine Wo Contrast  Result Date: 07/30/2019 CLINICAL DATA:  Fall EXAM: CT HEAD WITHOUT CONTRAST CT CERVICAL SPINE WITHOUT CONTRAST TECHNIQUE: Multidetector CT imaging of the head and cervical spine was performed following the standard protocol without intravenous contrast. Multiplanar CT image reconstructions of the cervical spine were also generated. COMPARISON:  04/14/2019 head CT FINDINGS: CT HEAD FINDINGS Brain: There is no mass, hemorrhage or extra-axial collection. The size and configuration of the ventricles and extra-axial CSF spaces are normal. The brain parenchyma is normal, without evidence of acute or chronic infarction. Vascular: No abnormal hyperdensity of the major intracranial arteries or dural venous sinuses. No intracranial atherosclerosis. Skull: The visualized skull base, calvarium and extracranial soft tissues are normal.  Sinuses/Orbits: No fluid levels or advanced mucosal thickening of the visualized paranasal sinuses. No mastoid or middle ear effusion. The orbits are normal. CT CERVICAL SPINE FINDINGS Alignment: No static subluxation. Facets are aligned. Occipital condyles are normally positioned. Skull base and vertebrae: No acute fracture. Soft tissues and spinal canal: No prevertebral fluid or swelling. No visible canal hematoma. Disc levels: No advanced spinal canal or neural foraminal stenosis. Upper chest: No pneumothorax, pulmonary nodule or pleural effusion. Other: Normal visualized paraspinal cervical soft tissues. IMPRESSION: 1. No acute intracranial abnormality. 2. No acute fracture or static subluxation of the cervical spine. Electronically Signed   By: Ulyses Jarred M.D.   On: 07/30/2019 23:00   Dg Chest Port 1 View  Result Date: 07/30/2019 CLINICAL DATA:  Cough and fever EXAM: PORTABLE CHEST 1 VIEW COMPARISON:  04/14/2019 FINDINGS: The heart size and mediastinal contours are within normal limits. Both lungs are clear. The visualized skeletal structures are unremarkable. IMPRESSION: No active disease. Electronically Signed   By: Ulyses Jarred M.D.   On: 07/30/2019 22:55    Procedures .Critical Care Performed by: Dorie Rank, MD Authorized by: Dorie Rank, MD   Critical care provider statement:    Critical care time (minutes):  45   Critical care was time spent personally by me on the following activities:  Discussions with consultants, evaluation of patient's response to treatment, examination of patient, ordering and performing treatments and interventions, ordering and review of laboratory studies, ordering and review of radiographic studies, pulse oximetry, re-evaluation of patient's condition, obtaining history from patient or surrogate and review of old charts   (including critical care time)  Medications Ordered in  ED Medications  sodium chloride 0.9 % bolus 1,000 mL (1,000 mLs Intravenous New  Bag/Given 07/30/19 2307)    Followed by  0.9 %  sodium chloride infusion (1,000 mLs Intravenous New Bag/Given 07/30/19 2307)  cefTRIAXone (ROCEPHIN) 2 g in sodium chloride 0.9 % 100 mL IVPB (0 g Intravenous Stopped 07/30/19 2341)  azithromycin (ZITHROMAX) 500 mg in sodium chloride 0.9 % 250 mL IVPB (500 mg Intravenous New Bag/Given 07/30/19 2339)  diltiazem (CARDIZEM) 1 mg/mL load via infusion 15 mg (has no administration in time range)    And  diltiazem (CARDIZEM) 125 mg in dextrose 5% 125 mL (1 mg/mL) infusion (has no administration in time range)  acetaminophen (TYLENOL) tablet 650 mg (650 mg Oral Given 07/30/19 2333)     Initial Impression / Assessment and Plan / ED Course  I have reviewed the triage vital signs and the nursing notes.  Pertinent labs & imaging results that were available during my care of the patient were reviewed by me and considered in my medical decision making (see chart for details).  Clinical Course as of Jul 30 2347  Mon Jul 30, 2019  2335 Patient now tachycardic jumping up and to the 160s 170s.  EKG consistent with atrial fibrillation.  I will start the patient on a Cardizem infusion   [JK]    Clinical Course User Index [JK] Dorie Rank, MD   Patient presents to the ED after a fall.  Patient was noted to have a fever with an oxygen requirement in the ED.  Chest x-ray without acute abnormalities.  Presentation is concerning for sepsis.  Pt went into a fib RVR.  Noted on the monitor.  BP remains stable.   Cardizem infusion ordered.  Hold off on full 30 cc/kg bolus now with covid concern.  Will need close monitoring.  Dr Rolland Porter will assume care. Final Clinical Impressions(s) / ED Diagnoses   Final diagnoses:  Sepsis, due to unspecified organism, unspecified whether acute organ dysfunction present [facility])  Atrial fibrillation with RVR ([place])      Dorie Rank, MD 07/30/19 2348

## 2019-07-30 NOTE — ED Triage Notes (Addendum)
Pt BIB [facility] from home. Pt has experience increased fatigue since yesterday. Pt has been up walking around throughout today, however, tonight pt slipped out of her bed and landed on her bottom. Pt is c/o neck and back pain, EMS placed pt in C-Collar. Pt HR is 116. EMS placed pt on 2L [employer] d/t pt O2 saturations 85% on RA. Pt denies SHOB, however has fever of 101.6 with no medications and has had a productive cough for several days producing yellow phlegm. Pt does c/o of dizziness with standing and denies blood thinners.

## 2019-07-30 NOTE — ED Notes (Signed)
EDP made aware of HR into the 170's and placing pt on 1L [place].

## 2019-07-31 ENCOUNTER — Inpatient Hospital Stay (HOSPITAL_COMMUNITY): Payer: Self-pay

## 2019-07-31 ENCOUNTER — Encounter (HOSPITAL_COMMUNITY): Payer: Self-pay | Admitting: Cardiology

## 2019-07-31 DIAGNOSIS — I4891 Unspecified atrial fibrillation: Secondary | ICD-10-CM

## 2019-07-31 DIAGNOSIS — J9601 Acute respiratory failure with hypoxia: Secondary | ICD-10-CM

## 2019-07-31 DIAGNOSIS — J189 Pneumonia, unspecified organism: Secondary | ICD-10-CM | POA: Diagnosis present

## 2019-07-31 DIAGNOSIS — I480 Paroxysmal atrial fibrillation: Secondary | ICD-10-CM

## 2019-07-31 DIAGNOSIS — A419 Sepsis, unspecified organism: Secondary | ICD-10-CM | POA: Diagnosis present

## 2019-07-31 LAB — RESPIRATORY PANEL BY PCR

## 2019-07-31 LAB — CBC
HCT: 30.7 % — ABNORMAL LOW (ref 36.0–46.0)
Hemoglobin: 8.8 g/dL — ABNORMAL LOW (ref 12.0–15.0)
MCH: 24.4 pg — ABNORMAL LOW (ref 26.0–34.0)
MCHC: 28.7 g/dL — ABNORMAL LOW (ref 30.0–36.0)
MCV: 85.3 fL (ref 80.0–100.0)
Platelets: 279 10*3/uL (ref 150–400)
RBC: 3.6 MIL/uL — ABNORMAL LOW (ref 3.87–5.11)
RDW: 17.9 % — ABNORMAL HIGH (ref 11.5–15.5)
WBC: 9.8 10*3/uL (ref 4.0–10.5)
nRBC: 0 % (ref 0.0–0.2)

## 2019-07-31 LAB — BASIC METABOLIC PANEL
Anion gap: 9 (ref 5–15)
BUN: 14 mg/dL (ref 6–20)
CO2: 23 mmol/L (ref 22–32)
Calcium: 7.9 mg/dL — ABNORMAL LOW (ref 8.9–10.3)
Chloride: 107 mmol/L (ref 98–111)
Creatinine, Ser: 0.96 mg/dL (ref 0.44–1.00)
GFR calc Af Amer: >60 mL/min (ref >60)
GFR calc non Af Amer: >60 mL/min (ref >60)
Glucose, Bld: 133 mg/dL — ABNORMAL HIGH (ref 70–99)
Potassium: 3.6 mmol/L (ref 3.5–5.1)
Sodium: 139 mmol/L (ref 135–145)

## 2019-07-31 LAB — INFLUENZA PANEL BY PCR (TYPE A & B)
Influenza A By PCR: NEGATIVE
Influenza B By PCR: NEGATIVE

## 2019-07-31 LAB — HIV ANTIBODY (ROUTINE TESTING W REFLEX): HIV Screen 4th Generation wRfx: NONREACTIVE

## 2019-07-31 LAB — URINALYSIS, ROUTINE W REFLEX MICROSCOPIC
Bilirubin Urine: NEGATIVE
Glucose, UA: NEGATIVE mg/dL
Ketones, ur: 5 mg/dL — AB
Nitrite: NEGATIVE
Protein, ur: NEGATIVE mg/dL
Specific Gravity, Urine: 1.012 (ref 1.005–1.030)
pH: 5 (ref 5.0–8.0)

## 2019-07-31 LAB — SARS CORONAVIRUS 2 (TAT 6-24 HRS): SARS Coronavirus 2: NEGATIVE

## 2019-07-31 LAB — PROTIME-INR
INR: 1.1 (ref 0.8–1.2)
Prothrombin Time: 14.1 seconds (ref 11.4–15.2)

## 2019-07-31 LAB — TROPONIN I (HIGH SENSITIVITY)
Troponin I (High Sensitivity): 1711 ng/L (ref <18)
Troponin I (High Sensitivity): 1102 ng/L (ref <18)
Troponin I (High Sensitivity): 736 ng/L (ref <18)

## 2019-07-31 LAB — ECHOCARDIOGRAM COMPLETE
Height: 66 in
Weight: 5280 oz

## 2019-07-31 LAB — MRSA PCR SCREENING: MRSA by PCR: NEGATIVE

## 2019-07-31 LAB — HEPARIN LEVEL (UNFRACTIONATED): Heparin Unfractionated: 0.17 IU/mL — ABNORMAL LOW (ref 0.30–0.70)

## 2019-07-31 LAB — APTT: aPTT: 31 seconds (ref 24–36)

## 2019-07-31 LAB — SARS CORONAVIRUS 2 BY RT PCR (HOSPITAL ORDER, PERFORMED IN ~~LOC~~ HOSPITAL LAB): SARS Coronavirus 2: NEGATIVE

## 2019-07-31 MED ORDER — HEPARIN BOLUS VIA INFUSION
4000.0000 [IU] | Freq: Once | INTRAVENOUS | Status: AC
  Administered 2019-07-31: 4000 [IU] via INTRAVENOUS
  Filled 2019-07-31: qty 4000

## 2019-07-31 MED ORDER — BUPROPION HCL ER (XL) 300 MG PO TB24
300.0000 mg | ORAL_TABLET | Freq: Every day | ORAL | Status: DC
  Administered 2019-07-31 – 2019-08-02 (×3): 300 mg via ORAL
  Filled 2019-07-31 (×3): qty 1

## 2019-07-31 MED ORDER — FLUOXETINE HCL 20 MG PO CAPS
20.0000 mg | ORAL_CAPSULE | Freq: Every day | ORAL | Status: DC
  Administered 2019-07-31 – 2019-08-01 (×3): 20 mg via ORAL
  Filled 2019-07-31 (×3): qty 1

## 2019-07-31 MED ORDER — ACETAMINOPHEN 325 MG PO TABS: 650.0000 mg | ORAL_TABLET | Freq: Four times a day (QID) | ORAL | Status: DC | PRN

## 2019-07-31 MED ORDER — CLONAZEPAM 1 MG PO TABS
1.0000 mg | ORAL_TABLET | Freq: Three times a day (TID) | ORAL | Status: DC | PRN
  Administered 2019-07-31 – 2019-08-02 (×5): 1 mg via ORAL
  Filled 2019-07-31 (×5): qty 1

## 2019-07-31 MED ORDER — INFLUENZA VAC SPLIT QUAD 0.5 ML IM SUSY
0.5000 mL | PREFILLED_SYRINGE | INTRAMUSCULAR | Status: AC
  Administered 2019-08-01: 0.5 mL via INTRAMUSCULAR
  Filled 2019-07-31: qty 0.5

## 2019-07-31 MED ORDER — ORAL CARE MOUTH RINSE
15.0000 mL | Freq: Two times a day (BID) | OROMUCOSAL | Status: DC
  Administered 2019-07-31 – 2019-08-02 (×4): 15 mL via OROMUCOSAL

## 2019-07-31 MED ORDER — ENOXAPARIN SODIUM 150 MG/ML ~~LOC~~ SOLN
150.0000 mg | Freq: Two times a day (BID) | SUBCUTANEOUS | Status: DC
  Administered 2019-07-31: 150 mg via SUBCUTANEOUS
  Filled 2019-07-31 (×2): qty 1

## 2019-07-31 MED ORDER — GABAPENTIN 400 MG PO CAPS
1200.0000 mg | ORAL_CAPSULE | Freq: Three times a day (TID) | ORAL | Status: DC
  Administered 2019-07-31 – 2019-08-02 (×8): 1200 mg via ORAL
  Filled 2019-07-31 (×8): qty 3

## 2019-07-31 MED ORDER — CHLORHEXIDINE GLUCONATE CLOTH 2 % EX PADS
6.0000 | MEDICATED_PAD | Freq: Every day | CUTANEOUS | Status: DC
  Administered 2019-07-31 – 2019-08-02 (×3): 6 via TOPICAL

## 2019-07-31 MED ORDER — HEPARIN (PORCINE) 25000 UT/250ML-% IV SOLN
1100.0000 [IU]/h | INTRAVENOUS | Status: DC
  Administered 2019-07-31: 1100 [IU]/h via INTRAVENOUS
  Filled 2019-07-31: qty 250

## 2019-07-31 MED ORDER — PERFLUTREN LIPID MICROSPHERE
1.0000 mL | INTRAVENOUS | Status: AC | PRN
  Administered 2019-07-31: 2 mL via INTRAVENOUS
  Filled 2019-07-31: qty 10

## 2019-07-31 MED ORDER — ENOXAPARIN SODIUM 40 MG/0.4ML ~~LOC~~ SOLN
40.0000 mg | Freq: Every day | SUBCUTANEOUS | Status: DC
  Filled 2019-07-31: qty 0.4

## 2019-07-31 MED ORDER — ENOXAPARIN SODIUM 150 MG/ML ~~LOC~~ SOLN
150.0000 mg | Freq: Once | SUBCUTANEOUS | Status: AC
  Administered 2019-07-31: 150 mg via SUBCUTANEOUS
  Filled 2019-07-31: qty 1

## 2019-07-31 MED ORDER — MORPHINE SULFATE (PF) 2 MG/ML IV SOLN
2.0000 mg | INTRAVENOUS | Status: DC | PRN
  Administered 2019-07-31: 2 mg via INTRAVENOUS
  Filled 2019-07-31: qty 1

## 2019-07-31 MED ORDER — OLANZAPINE 5 MG PO TABS
10.0000 mg | ORAL_TABLET | Freq: Every day | ORAL | Status: DC
  Administered 2019-07-31 – 2019-08-01 (×3): 10 mg via ORAL
  Filled 2019-07-31: qty 2
  Filled 2019-07-31 (×3): qty 1

## 2019-07-31 MED ORDER — SODIUM CHLORIDE 0.9 % IV BOLUS
500.0000 mL | Freq: Once | INTRAVENOUS | Status: AC
  Administered 2019-07-31: 500 mL via INTRAVENOUS

## 2019-07-31 NOTE — Progress Notes (Addendum)
2H repeat trop ordered to trend.  Patient just now reporting onset of chest tightness; newly onset for ~ the past 1 hour, this despite the fact that HR is the best its looked all evening (110s)  Will start heparin gtt and call cards.  Update:  Spoke with Dr. Elson Areas (Cardiology on call), still thinks its probably demand ischemia at this point: 1) agreed with heparin gtt (will cover for both ACS and A.Fib) 2) Serial trops 3) doesn't need to come to [facility] right now 4) Cards will see in consult here at [facility] during day 5) morphine PRN pain, dont do NTG right now 6) Repeat EKG grossly unchanged: non-specific T wave flattening, TWI in III and AVF, but nothing that looks like a STEMI.

## 2019-07-31 NOTE — ED Notes (Signed)
Pt placed on [place] 

## 2019-07-31 NOTE — H&P (Signed)
History and Physical    Tammy [name] [id number] DOB: 03-13-1961 DOA: 07/30/2019  PCP: Fanny Bien, MD  Patient coming from: Home  I have personally briefly reviewed patient's old medical records in [place]  Chief Complaint: Fall, Fever  HPI: Tammy [name] is a 58 y.o. female with medical history significant of obesity.  Patient presents to the ED for evaluation after a fall at home.  Patient states she has been a bit fatigued since yesterday but she was up and walking around.  Patient slipped out of her bed and landed on her bottom.  She had pain in her neck and back as well as her right wrist.  EMS was called because of her fall.  EMS noted that she had low O2 sats (low 80s) on room air and put her on O2.  She does not normally need O2 at home at baseline.  Patient denies fevers (subjectively) but has had mild cough at home.  No vomiting, diarrhea, abd pain.  Maybe slight dysuria.   ED Course: Tm 101.6, new O2 requirement as noted above.  Pt with A.Fib RVR up to 170s, now improved to 120s-140s on cardizem gtt.  WBC 11.1k  No rash, no open wounds, no abd pain, no headache nor neck stiffness.  No CP.  CXR neg.  Patient put on rocephin + Azithromycin empircally for CAP.  COVID has come back negative.   Review of Systems: As per HPI, otherwise all review of systems negative.  Past Medical History:  Diagnosis Date   Anxiety    Arthritis    neck and lower back - deg disc disease, fingers   Depression    Frequency of urination    GERD (gastroesophageal reflux disease)    History of seizure per pt no seizure since    11-07-2015  in setting of stress and sleep deprivation  /  negative work-up by neurologist unknown idiology   Hypothyroidism    Injury of thumb, left    03-31-2016     Pelvic relaxation    SUI (stress urinary incontinence, female)    Wears glasses     Past Surgical History:  Procedure Laterality Date   ANTERIOR AND  POSTERIOR REPAIR  09/25/2012   Procedure: ANTERIOR (CYSTOCELE) AND POSTERIOR REPAIR (RECTOCELE);  Surgeon: Lahoma Crocker, MD;  Location: [facility];  Service: Gynecology;  Laterality: N/A;   ANTERIOR AND POSTERIOR REPAIR WITH SACROSPINOUS FIXATION N/A 04/12/2016   Procedure: ANTERIOR AND POSTERIOR REPAIR WITH SACROSPINOUS LIGAMENT SUSPENSION, CYSTOSCOPY;  Surgeon: Arvella Nigh, MD;  Location: [facility];  Service: Gynecology;  Laterality: N/A;   APPENDECTOMY  1987   BREAST EXCISIONAL BIOPSY Left    No scar visable    BREAST REDUCTION SURGERY  2000  aprrox   ENDOMETRIAL ABLATION W/ NOVASURE  2011   in office   EXCISION LEFT BREAST BX  12-07-1999   benign   LAPAROSCOPIC CHOLECYSTECTOMY  02-09-2005   LAPAROSCOPIC VAGINAL HYSTERECTOMY WITH SALPINGO OOPHORECTOMY Bilateral 04/12/2016   Procedure: LAPAROSCOPIC ASSISTED VAGINAL HYSTERECTOMY WITH SALPINGO OOPHORECTOMY;  Surgeon: Arvella Nigh, MD;  Location: [place] [facility];  Service: Gynecology;  Laterality: Bilateral;   REDUCTION MAMMAPLASTY Bilateral    ROUX-EN-Y GASTRIC BYPASS  06/  2002   TUBAL LIGATION  10/ 1990     reports that she has never smoked. She has never used smokeless tobacco. She reports that she does not drink alcohol or use drugs.  Allergies  Allergen Reactions   Nsaids Other (  See Comments)    Post Gastric Bypass   Adhesive [Tape] Itching and Rash   Latex Itching and Rash   Sulfa Antibiotics Itching and Rash    Family History  Problem Relation Age of Onset   Colon polyps Mother    Breast cancer Maternal Aunt    Stomach cancer Maternal Grandmother    Diabetes Maternal Grandmother    Heart disease Maternal Grandmother    Stomach cancer Maternal Uncle    Colon cancer Maternal Uncle        x3   Diabetes Maternal Aunt        Type I   Diabetes Maternal Uncle        x5 - Type II   Heart disease Paternal Grandmother    Esophageal cancer Neg Hx    Gallbladder disease  Neg Hx    Seizures Neg Hx      Prior to Admission medications   Medication Sig Start Date End Date Taking? Authorizing Provider  acetaminophen (TYLENOL) 325 MG tablet Take 650 mg by mouth every 6 (six) hours as needed for mild pain or fever.   Yes [provider]  buPROPion (WELLBUTRIN XL) 300 MG 24 hr tablet Take 300 mg by mouth daily.  07/26/18  Yes [provider]  clonazePAM (KLONOPIN) 1 MG tablet Take 1 mg by mouth 3 (three) times daily as needed for anxiety. 03/26/19  Yes [provider]  FLUoxetine (PROZAC) 20 MG capsule Take 20 mg by mouth at bedtime.  07/26/18  Yes [provider]  gabapentin (NEURONTIN) 600 MG tablet Take 1,200 mg by mouth 3 (three) times daily. 08/12/18  Yes [provider]  OLANZapine (ZYPREXA) 10 MG tablet Take 10 mg by mouth at bedtime. 04/04/19  Yes [provider]    Physical Exam: Vitals:   07/31/19 0125 07/31/19 0130 07/31/19 0135 07/31/19 0140  BP: 114/82 117/80 99/64 (!) 124/56  Pulse: (!) 125 (!) 131 (!) 109 (!) 121  Resp: (!) 24 18 (!) 22 18  Temp:      TempSrc:      SpO2: 97% 95% 94% 96%  Weight:      Height:        Constitutional: NAD, calm, comfortable Eyes: PERRL, lids and conjunctivae normal ENMT: Mucous membranes are moist. Posterior pharynx clear of any exudate or lesions.Normal dentition.  Neck: normal, supple, no masses, no thyromegaly Respiratory: clear to auscultation bilaterally, no wheezing, no crackles. Normal respiratory effort. No accessory muscle use.  Cardiovascular: Regular rate and rhythm, no murmurs / rubs / gallops. No extremity edema. 2+ pedal pulses. No carotid bruits.  Abdomen: no tenderness, no masses palpated. No hepatosplenomegaly. Bowel sounds positive.  Musculoskeletal: no clubbing / cyanosis. No joint deformity upper and lower extremities. Good ROM, no contractures. Normal muscle tone.  Skin: no rashes, lesions, ulcers. No induration Neurologic: CN 2-12 grossly  intact. Sensation intact, DTR normal. Strength 5/5 in all 4.  Psychiatric: Normal judgment and insight. Alert and oriented x 3. Normal mood.    Labs on Admission: I have personally reviewed following labs and imaging studies  CBC: Recent Labs  Lab 07/30/19 2315  WBC 11.1*  NEUTROABS 8.4*  HGB 9.6*  HCT 33.4*  MCV 84.1  PLT [id number]   Basic Metabolic Panel: Recent Labs  Lab 07/30/19 2315  NA 136  K 3.9  CL 104  CO2 24  GLUCOSE 123*  BUN 13  CREATININE 1.01*  CALCIUM 8.2*   GFR: Estimated Creatinine Clearance: 93.8 mL/min (  A) (by C-G formula based on SCr of 1.01 mg/dL (H)). Liver Function Tests: Recent Labs  Lab 07/30/19 2315  AST 23  ALT 18  ALKPHOS 84  BILITOT 0.8  PROT 6.9  ALBUMIN 3.5   No results for input(s): LIPASE, AMYLASE in the last 168 hours. No results for input(s): AMMONIA in the last 168 hours. Coagulation Profile: Recent Labs  Lab 07/30/19 2315  INR 1.1   Cardiac Enzymes: No results for input(s): CKTOTAL, CKMB, CKMBINDEX, TROPONINI in the last 168 hours. BNP (last 3 results) No results for input(s): PROBNP in the last 8760 hours. HbA1C: No results for input(s): HGBA1C in the last 72 hours. CBG: No results for input(s): GLUCAP in the last 168 hours. Lipid Profile: No results for input(s): CHOL, HDL, LDLCALC, TRIG, CHOLHDL, LDLDIRECT in the last 72 hours. Thyroid Function Tests: No results for input(s): TSH, T4TOTAL, FREET4, T3FREE, THYROIDAB in the last 72 hours. Anemia Panel: No results for input(s): VITAMINB12, FOLATE, FERRITIN, TIBC, IRON, RETICCTPCT in the last 72 hours. Urine analysis:    Component Value Date/Time   COLORURINE YELLOW 04/14/2019 1138   APPEARANCEUR CLOUDY (A) 04/14/2019 1138   LABSPEC 1.023 04/14/2019 1138   PHURINE 5.0 04/14/2019 1138   GLUCOSEU NEGATIVE 04/14/2019 1138   HGBUR MODERATE (A) 04/14/2019 1138   BILIRUBINUR SMALL (A) 04/14/2019 1138   KETONESUR NEGATIVE 04/14/2019 1138   PROTEINUR 30 (A) 04/14/2019  1138   UROBILINOGEN 1.0 03/13/2017 1637   NITRITE NEGATIVE 04/14/2019 1138   LEUKOCYTESUR MODERATE (A) 04/14/2019 1138    Radiological Exams on Admission: Dg Lumbar Spine Complete  Result Date: 07/30/2019 CLINICAL DATA:  Fall EXAM: LUMBAR SPINE - COMPLETE 4+ VIEW COMPARISON:  None. FINDINGS: There is no evidence of lumbar spine fracture. Alignment is normal. Disc space narrowing is greatest at L4-5. IMPRESSION: No fracture or listhesis of the lumbar spine. Degenerative disc disease greatest at L4-5. Electronically Signed   By: Ulyses Jarred M.D.   On: 07/30/2019 22:54   Dg Wrist Complete Right  Result Date: 07/30/2019 CLINICAL DATA:  Fall EXAM: RIGHT WRIST - COMPLETE 3+ VIEW COMPARISON:  None. FINDINGS: There is no evidence of fracture or dislocation. There is no evidence of arthropathy or other focal bone abnormality. Soft tissues are unremarkable. IMPRESSION: Negative. Electronically Signed   By: Ulyses Jarred M.D.   On: 07/30/2019 22:53   Ct Head Wo Contrast  Result Date: 07/30/2019 CLINICAL DATA:  Fall EXAM: CT HEAD WITHOUT CONTRAST CT CERVICAL SPINE WITHOUT CONTRAST TECHNIQUE: Multidetector CT imaging of the head and cervical spine was performed following the standard protocol without intravenous contrast. Multiplanar CT image reconstructions of the cervical spine were also generated. COMPARISON:  04/14/2019 head CT FINDINGS: CT HEAD FINDINGS Brain: There is no mass, hemorrhage or extra-axial collection. The size and configuration of the ventricles and extra-axial CSF spaces are normal. The brain parenchyma is normal, without evidence of acute or chronic infarction. Vascular: No abnormal hyperdensity of the major intracranial arteries or dural venous sinuses. No intracranial atherosclerosis. Skull: The visualized skull base, calvarium and extracranial soft tissues are normal. Sinuses/Orbits: No fluid levels or advanced mucosal thickening of the visualized paranasal sinuses. No mastoid or  middle ear effusion. The orbits are normal. CT CERVICAL SPINE FINDINGS Alignment: No static subluxation. Facets are aligned. Occipital condyles are normally positioned. Skull base and vertebrae: No acute fracture. Soft tissues and spinal canal: No prevertebral fluid or swelling. No visible canal hematoma. Disc levels: No advanced spinal canal or neural foraminal stenosis. Upper  chest: No pneumothorax, pulmonary nodule or pleural effusion. Other: Normal visualized paraspinal cervical soft tissues. IMPRESSION: 1. No acute intracranial abnormality. 2. No acute fracture or static subluxation of the cervical spine. Electronically Signed   By: Ulyses Jarred M.D.   On: 07/30/2019 23:00   Ct Cervical Spine Wo Contrast  Result Date: 07/30/2019 CLINICAL DATA:  Fall EXAM: CT HEAD WITHOUT CONTRAST CT CERVICAL SPINE WITHOUT CONTRAST TECHNIQUE: Multidetector CT imaging of the head and cervical spine was performed following the standard protocol without intravenous contrast. Multiplanar CT image reconstructions of the cervical spine were also generated. COMPARISON:  04/14/2019 head CT FINDINGS: CT HEAD FINDINGS Brain: There is no mass, hemorrhage or extra-axial collection. The size and configuration of the ventricles and extra-axial CSF spaces are normal. The brain parenchyma is normal, without evidence of acute or chronic infarction. Vascular: No abnormal hyperdensity of the major intracranial arteries or dural venous sinuses. No intracranial atherosclerosis. Skull: The visualized skull base, calvarium and extracranial soft tissues are normal. Sinuses/Orbits: No fluid levels or advanced mucosal thickening of the visualized paranasal sinuses. No mastoid or middle ear effusion. The orbits are normal. CT CERVICAL SPINE FINDINGS Alignment: No static subluxation. Facets are aligned. Occipital condyles are normally positioned. Skull base and vertebrae: No acute fracture. Soft tissues and spinal canal: No prevertebral fluid or  swelling. No visible canal hematoma. Disc levels: No advanced spinal canal or neural foraminal stenosis. Upper chest: No pneumothorax, pulmonary nodule or pleural effusion. Other: Normal visualized paraspinal cervical soft tissues. IMPRESSION: 1. No acute intracranial abnormality. 2. No acute fracture or static subluxation of the cervical spine. Electronically Signed   By: Ulyses Jarred M.D.   On: 07/30/2019 23:00   Dg Chest Port 1 View  Result Date: 07/30/2019 CLINICAL DATA:  Cough and fever EXAM: PORTABLE CHEST 1 VIEW COMPARISON:  04/14/2019 FINDINGS: The heart size and mediastinal contours are within normal limits. Both lungs are clear. The visualized skeletal structures are unremarkable. IMPRESSION: No active disease. Electronically Signed   By: Ulyses Jarred M.D.   On: 07/30/2019 22:55    EKG: Independently reviewed.  Assessment/Plan Principal Problem:   CAP (community acquired pneumonia) Active Problems:   Acute respiratory failure with hypoxia (HCC)   Atrial fibrillation with RVR (HCC)    1. Acute respiratory failure with hypoxia, fever - 1. Will treat as CAP empirically 2. CAP pathway 3. Rocephin / azithromycin 4. IVF: 1.5L bolus in ED and NS at 125 cc/hr 5. RVP and influenza pending 6. COVID neg but will admit as PUI and leave on airborne precautions 1. Repeat covid testing later today in case false negative. 7. UA pending 8. BCx pending 2. New onset A.Fib RVR - 1. CHADS-VASc is either 1 or 2: 1 point for gender, and possibly a point for HTN (on history, but patient takes no HTN meds chronically), will hold off on starting full dose anticoagulation for the moment. 2. Cardizem gtt 3. Tele monitor 4. 2d echo 5. Will check a troponin as well (though no CP)  DVT prophylaxis: Lovenox Code Status: Full Family Communication: No family in room Disposition Plan: Home after admit Consults called: None Admission status: Admit to inpatient  Severity of Illness: The appropriate  patient status for this patient is INPATIENT. Inpatient status is judged to be reasonable and necessary in order to provide the required intensity of service to ensure the patient's safety. The patient's presenting symptoms, physical exam findings, and initial radiographic and laboratory data in the context of their chronic  comorbidities is felt to place them at high risk for further clinical deterioration. Furthermore, it is not anticipated that the patient will be medically stable for discharge from the hospital within 2 midnights of admission. The following factors support the patient status of inpatient.   IP status for presumably pneumonia of some flavor (fever, new O2 requirement).  As well as new onset A.Fib RVR.   * I certify that at the point of admission it is my clinical judgment that the patient will require inpatient hospital care spanning beyond 2 midnights from the point of admission due to high intensity of service, high risk for further deterioration and high frequency of surveillance required.*    Dashun Borre M. DO [facility]  How to contact the [facility] Attending or Consulting provider [place] or covering provider during after hours [place], for this patient?  1. Check the care team in [facility] and look for a) attending/consulting [facility] provider listed and b) the [facility] team listed 2. Log into [email or web address]  Amion Physician Scheduling and messaging for groups and whole hospitals  On call and physician scheduling software for group practices, residents, hospitalists and other medical providers for call, clinic, rotation and shift schedules. OnCall [employer] is a hospital-wide system for scheduling doctors and paging doctors on call. EasyPlot is for scientific plotting and data analysis.  [email or web address]  and use [place]'s universal password to access. If you do not have the password, please contact the hospital operator.  3. Locate the [facility] provider you are looking for under [facility] and page to a number that you can be directly reached. 4. If you still have difficulty reaching the provider, please page the [facility] (Director on Call) for the Hospitalists listed on amion for assistance.  07/31/2019, 1:49 AM

## 2019-07-31 NOTE — ED Provider Notes (Signed)
Patient left a change of shift, presenting with fever and new onset atrial fibrillation with rapid ventricular response.  She did have an episode of hypotension after her Cardizem bolus into the low 90s.  She responded to a 500 cc bolus.  When I rechecked her at 1250 her heart rate is 120-140, her blood pressure is 118/82.  1:35 AM Dr. Alcario Drought, hospitalist will admit  Results for orders placed or performed during the hospital encounter of 07/30/19  SARS Coronavirus 2 by RT PCR (hospital order, performed in [facility] hospital lab) Nasopharyngeal Nasopharyngeal Swab   Specimen: Nasopharyngeal Swab  Result Value Ref Range   SARS Coronavirus 2 NEGATIVE NEGATIVE  Lactic acid, plasma  Result Value Ref Range   Lactic Acid, Venous 1.4 0.5 - 1.9 mmol/L  Comprehensive metabolic panel  Result Value Ref Range   Sodium 136 135 - 145 mmol/L   Potassium 3.9 3.5 - 5.1 mmol/L   Chloride 104 98 - 111 mmol/L   CO2 24 22 - 32 mmol/L   Glucose, Bld 123 (H) 70 - 99 mg/dL   BUN 13 6 - 20 mg/dL   Creatinine, Ser 1.01 (H) 0.44 - 1.00 mg/dL   Calcium 8.2 (L) 8.9 - 10.3 mg/dL   Total Protein 6.9 6.5 - 8.1 g/dL   Albumin 3.5 3.5 - 5.0 g/dL   AST 23 15 - 41 U/L   ALT 18 0 - 44 U/L   Alkaline Phosphatase 84 38 - 126 U/L   Total Bilirubin 0.8 0.3 - 1.2 mg/dL   GFR calc non Af Amer >60 >60 mL/min   GFR calc Af Amer >60 >60 mL/min   Anion gap 8 5 - 15  CBC WITH DIFFERENTIAL  Result Value Ref Range   WBC 11.1 (H) 4.0 - 10.5 K/uL   RBC 3.97 3.87 - 5.11 MIL/uL   Hemoglobin 9.6 (L) 12.0 - 15.0 g/dL   HCT 33.4 (L) 36.0 - 46.0 %   MCV 84.1 80.0 - 100.0 fL   MCH 24.2 (L) 26.0 - 34.0 pg   MCHC 28.7 (L) 30.0 - 36.0 g/dL   RDW 17.9 (H) 11.5 - 15.5 %   Platelets 342 150 - 400 K/uL   nRBC 0.0 0.0 - 0.2 %   Neutrophils Relative % 76 %   Neutro Abs 8.4 (H) 1.7 - 7.7 K/uL   Lymphocytes Relative 17 %   Lymphs Abs 1.9 0.7 - 4.0 K/uL   Monocytes Relative 7 %   Monocytes Absolute 0.7 0.1 - 1.0 K/uL   Eosinophils  Relative 0 %   Eosinophils Absolute 0.0 0.0 - 0.5 K/uL   Basophils Relative 0 %   Basophils Absolute 0.0 0.0 - 0.1 K/uL   Immature Granulocytes 0 %   Abs Immature Granulocytes 0.03 0.00 - 0.07 K/uL  APTT  Result Value Ref Range   aPTT 31 24 - 36 seconds  Protime-INR  Result Value Ref Range   Prothrombin Time 14.1 11.4 - 15.2 seconds   INR 1.1 0.8 - 1.2   Laboratory interpretation all normal except hyperglycemia, stable anemia, leukocytosis  Dg Lumbar Spine Complete  Result Date: 07/30/2019 CLINICAL DATA:  Fall EXAM: LUMBAR SPINE - COMPLETE 4+ VIEW COMPARISON:  None. FINDINGS: There is no evidence of lumbar spine fracture. Alignment is normal. Disc space narrowing is greatest at L4-5. IMPRESSION: No fracture or listhesis of the lumbar spine. Degenerative disc disease greatest at L4-5. Electronically Signed   By: Ulyses Jarred M.D.   On: 07/30/2019 22:54  Dg Wrist Complete Right  Result Date: 07/30/2019 CLINICAL DATA:  Fall EXAM: RIGHT WRIST - COMPLETE 3+ VIEW COMPARISON:  None. FINDINGS: There is no evidence of fracture or dislocation. There is no evidence of arthropathy or other focal bone abnormality. Soft tissues are unremarkable. IMPRESSION: Negative. Electronically Signed   By: Ulyses Jarred M.D.   On: 07/30/2019 22:53   Ct Head Wo Contrast Ct Cervical Spine Wo Contrast  Result Date: 07/30/2019 CLINICAL DATA:  Fall EXAM: CT HEAD WITHOUT CONTRAST CT CERVICAL SPINE WITHOUT CONTRAST TECHNIQUE: Multidetector CT imaging of the head and cervical spine was performed following the standard protocol without intravenous contrast. Multiplanar CT image reconstructions of the cervical spine were also generated. COMPARISON:  04/14/2019 head CT FINDINGS: CT HEAD FINDINGS Brain: There is no mass, hemorrhage or extra-axial collection. The size and configuration of the ventricles and extra-axial CSF spaces are normal. The brain parenchyma is normal, without evidence of acute or chronic infarction.  Vascular: No abnormal hyperdensity of the major intracranial arteries or dural venous sinuses. No intracranial atherosclerosis. Skull: The visualized skull base, calvarium and extracranial soft tissues are normal. Sinuses/Orbits: No fluid levels or advanced mucosal thickening of the visualized paranasal sinuses. No mastoid or middle ear effusion. The orbits are normal. CT CERVICAL SPINE FINDINGS Alignment: No static subluxation. Facets are aligned. Occipital condyles are normally positioned. Skull base and vertebrae: No acute fracture. Soft tissues and spinal canal: No prevertebral fluid or swelling. No visible canal hematoma. Disc levels: No advanced spinal canal or neural foraminal stenosis. Upper chest: No pneumothorax, pulmonary nodule or pleural effusion. Other: Normal visualized paraspinal cervical soft tissues. IMPRESSION: 1. No acute intracranial abnormality. 2. No acute fracture or static subluxation of the cervical spine. Electronically Signed   By: Ulyses Jarred M.D.   On: 07/30/2019 23:00   Dg Chest Port 1 View  Result Date: 07/30/2019 CLINICAL DATA:  Cough and fever EXAM: PORTABLE CHEST 1 VIEW COMPARISON:  04/14/2019 FINDINGS: The heart size and mediastinal contours are within normal limits. Both lungs are clear. The visualized skeletal structures are unremarkable. IMPRESSION: No active disease. Electronically Signed   By: Ulyses Jarred M.D.   On: 07/30/2019 22:55   Diagnoses that have been ruled out:  None  Diagnoses that are still under consideration:  None  Final diagnoses:  Sepsis, due to unspecified organism, unspecified whether acute organ dysfunction present [facility])  Atrial fibrillation with RVR [facility])   Plan admission  Rolland Porter, MD, Barbette Or, MD 07/31/19 [id number]

## 2019-07-31 NOTE — Progress Notes (Signed)
ANTICOAGULATION CONSULT NOTE - Follow Up Consult  Pharmacy Consult for heparin --> lovenox Indication: chest pain/ACS and atrial fibrillation  Allergies  Allergen Reactions  . Nsaids Other (See Comments)    Post Gastric Bypass  . Adhesive [Tape] Itching and Rash  . Latex Itching and Rash  . Sulfa Antibiotics Itching and Rash    Patient Measurements: Height: 5\' 6"  (167.6 cm) Weight: (!) 330 lb (149.7 kg) IBW/kg (Calculated) : 59.3 Heparin Dosing Weight: 97 kg  Vital Signs: Temp: 97.6 F (36.4 C) (10/27 0757) Temp Source: Oral (10/27 0757) BP: 109/59 (10/27 0700) Pulse Rate: 130 (10/27 0700)  Labs: Recent Labs    07/30/19 2315 07/31/19 0314 07/31/19 0518 07/31/19 0909  HGB 9.6*  --  8.8*  --   HCT 33.4*  --  30.7*  --   PLT 342  --  279  --   APTT 31  --   --   --   LABPROT 14.1  --   --   --   INR 1.1  --   --   --   CREATININE 1.01*  --  0.96  --   TROPONINIHS  --  1,711* 1,102* 736*    Estimated Creatinine Clearance: 98.7 mL/min (by C-G formula based on SCr of 0.96 mg/dL).   Assessment: Patient's a 58 y.o F presented to the ED on 10/26 with c/o fall and fever.  She subsequently had CP with elevated troponin and PAF with RVR.  Cardiology team suspects CP may be demand ischemia.  Heparin drip was started on 10/27 for r/o ACS and Afib.  Per Dr. Percival Spanish, ok to transition patient to [place].  Today, 07/31/2019: - first heparin level is subtherapeutic at 0.17 - hgb down slightly to 8.8, plts ok - no bleeding documented  Goal of Therapy:  Heparin level 0.3-0.7 units/ml Monitor platelets by anticoagulation protocol: Yes   Plan:  - d/c heparin drip  - start lovenox 150 mg SQ q12h (give first dose 1 hr after heparin drip has been d/ced) - cbc q72h - monitor for s/s bleeding - pharmacy will sign off, but will follow pt peripherally along with you  Pham, Anh P 07/31/2019,11:18 AM

## 2019-07-31 NOTE — ED Notes (Signed)
Date and time results received: 07/31/19 0400 (use smartphrase ".now" to insert current time)  Test: Trop  Critical Value: 1711  Name of Provider Notified: Alcario Drought, DO  Orders Received? Or Actions Taken?: Orders Received - See Orders for details

## 2019-07-31 NOTE — ED Notes (Signed)
Husband [phone]

## 2019-07-31 NOTE — Consult Note (Addendum)
Cardiology Consultation:   Patient ID: Tammy [name] MRN: [id number]; DOB: 22-May-1961  Admit date: 07/30/2019 Date of Consult: 07/31/2019  Primary Care Provider: Fanny Bien, MD Primary Cardiologist: Tammy Breeding, MD new Primary Electrophysiologist:  None    Patient Profile:   Tammy [name] is a 58 y.o. female with a hx of obesity with BMI 53.26, stress urinary incontinence, depression and anxiety who was admitted after a fall and neck pain and found to be septic with PNA, COVID neg, and is being seen today for the evaluation of atrial fib and chest pain at the request of Dr. [place]  History of Present Illness:   Tammy [name] with no prior cardiac hx was admitted with PNA and sepsis early AM today.  She is on ABX for her PNA, influenza neg and repeating COVID test.  Then after admit she began with chest pain and IV heparin started and found to be in a fib.  (In July admitted with hypoxia, patchy infiltrates on CXR treated and discharged)  Initially by EKG was in SR but around 0400 was in a fib and had chest pain.  RVR at 118. She was placed on IV heparin and IV dilt started.    EKG:  The EKG was personally reviewed and demonstrates:  At 22:53 07/30/19 in SR at 118 with borderline T wave abnormalities.  Follow up 07/31/19 at 04:18 with ST depression inf leads and in a fib. With RVR Telemetry:  Telemetry was personally reviewed and demonstrates:  A fib   HS Troponin 1711 and 1102 Na 139, K+ 3.6, Cl 107, Glucose 133, BUN 14, Cr 0.96,  Hgb 8,8, Hct 30.7 WBC 9.8 plts279 Blood cultures pending.  CXR -P with no acute disease  Currently  Temp on admit 101.6, now 97.6  respt now 19 BP 109/59  resting  Heart Pathway Score:     Past Medical History:  Diagnosis Date   Anxiety    Arthritis    neck and lower back - deg disc disease, fingers   Depression    Frequency of urination    GERD (gastroesophageal reflux disease)    History of seizure per pt no seizure since    11-07-2015  in setting of stress and sleep deprivation  /  negative work-up by neurologist unknown idiology   Hypothyroidism    Injury of thumb, left    03-31-2016     Pelvic relaxation    SUI (stress urinary incontinence, female)    Wears glasses     Past Surgical History:  Procedure Laterality Date   ANTERIOR AND POSTERIOR REPAIR  09/25/2012   Procedure: ANTERIOR (CYSTOCELE) AND POSTERIOR REPAIR (RECTOCELE);  Surgeon: Tammy Crocker, MD;  Location: [facility];  Service: Gynecology;  Laterality: N/A;   ANTERIOR AND POSTERIOR REPAIR WITH SACROSPINOUS FIXATION N/A 04/12/2016   Procedure: ANTERIOR AND POSTERIOR REPAIR WITH SACROSPINOUS LIGAMENT SUSPENSION, CYSTOSCOPY;  Surgeon: Tammy Nigh, MD;  Location: [facility];  Service: Gynecology;  Laterality: N/A;   APPENDECTOMY  1987   BREAST EXCISIONAL BIOPSY Left    No scar visable    BREAST REDUCTION SURGERY  2000  aprrox   ENDOMETRIAL ABLATION W/ NOVASURE  2011   in office   EXCISION LEFT BREAST BX  12-07-1999   benign   LAPAROSCOPIC CHOLECYSTECTOMY  02-09-2005   LAPAROSCOPIC VAGINAL HYSTERECTOMY WITH SALPINGO OOPHORECTOMY Bilateral 04/12/2016   Procedure: LAPAROSCOPIC ASSISTED VAGINAL HYSTERECTOMY WITH SALPINGO OOPHORECTOMY;  Surgeon: Tammy Nigh, MD;  Location: [facility];  Service: Gynecology;  Laterality: Bilateral;   REDUCTION MAMMAPLASTY Bilateral    ROUX-EN-Y GASTRIC BYPASS  06/  2002   TUBAL LIGATION  10/ 1990     Home Medications:  Prior to Admission medications   Medication Sig Start Date End Date Taking? Authorizing Provider  acetaminophen (TYLENOL) 325 MG tablet Take 650 mg by mouth every 6 (six) hours as needed for mild pain or fever.   Yes [provider]  buPROPion (WELLBUTRIN XL) 300 MG 24 hr tablet Take 300 mg by mouth daily.  07/26/18  Yes [provider]  clonazePAM (KLONOPIN) 1 MG tablet Take 1 mg by mouth 3 (three) times daily as needed for anxiety.  03/26/19  Yes [provider]  FLUoxetine (PROZAC) 20 MG capsule Take 20 mg by mouth at bedtime.  07/26/18  Yes [provider]  gabapentin (NEURONTIN) 600 MG tablet Take 1,200 mg by mouth 3 (three) times daily. 08/12/18  Yes [provider]  OLANZapine (ZYPREXA) 10 MG tablet Take 10 mg by mouth at bedtime. 04/04/19  Yes [provider]    Inpatient Medications: Scheduled Meds:  buPROPion  300 mg Oral Daily   Chlorhexidine Gluconate Cloth  6 each Topical Q0600   FLUoxetine  20 mg Oral QHS   gabapentin  1,200 mg Oral TID   mouth rinse  15 mL Mouth Rinse BID   OLANZapine  10 mg Oral QHS   Continuous Infusions:  sodium chloride 1,000 mL (07/30/19 2307)   azithromycin Stopped (07/31/19 0051)   cefTRIAXone (ROCEPHIN)  IV Stopped (07/30/19 2336)   diltiazem (CARDIZEM) infusion 10 mg/hr (07/31/19 0700)   heparin 1,100 Units/hr (07/31/19 0700)   PRN Meds: acetaminophen, clonazePAM, morphine injection  Allergies:    Allergies  Allergen Reactions   Nsaids Other (See Comments)    Post Gastric Bypass   Adhesive [Tape] Itching and Rash   Latex Itching and Rash   Sulfa Antibiotics Itching and Rash    Social History:   Social History   Socioeconomic History   Marital status: Married    Spouse name: Tammy [name]   Number of children: 3   Years of education: 16   Highest education level: Not on file  Occupational History   Occupation: Journalist, newspaper strain: Not on file   Food insecurity    Worry: Not on file    Inability: Not on file   Transportation needs    Medical: Not on file    Non-medical: Not on file  Tobacco Use   Smoking status: Never Smoker   Smokeless tobacco: Never Used  Substance and Sexual Activity   Alcohol use: No    Comment: drinks daily - none since 08/2012 per pt.   Drug use: No   Sexual activity: Yes    Birth control/protection: Surgical  Lifestyle   Physical  activity    Days per week: Not on file    Minutes per session: Not on file   Stress: Not on file  Relationships   Social connections    Talks on phone: Not on file    Gets together: Not on file    Attends religious service: Not on file    Active member of club or organization: Not on file    Attends meetings of clubs or organizations: Not on file    Relationship status: Not on file   Intimate partner violence    Fear of current or ex partner: Not on file  Emotionally abused: Not on file    Physically abused: Not on file    Forced sexual activity: Not on file  Other Topics Concern   Not on file  Social History Narrative   Lives with spouse   Caffeine use: 2-3 cups per day    Family History:    Family History  Problem Relation Age of Onset   Colon polyps Mother    Breast cancer Maternal Aunt    Stomach cancer Maternal Grandmother    Diabetes Maternal Grandmother    Heart disease Maternal Grandmother    Stomach cancer Maternal Uncle    Colon cancer Maternal Uncle        x3   Diabetes Maternal Aunt        Type I   Diabetes Maternal Uncle        x5 - Type II   Heart disease Paternal Grandmother    Esophageal cancer Neg Hx    Gallbladder disease Neg Hx    Seizures Neg Hx      ROS:  Please see the history of present illness.  General:no colds or fevers, no weight changes Skin:no rashes or ulcers HEENT:no blurred vision, no congestion CV:see HPI PUL:see HPI GI:no diarrhea constipation or melena, no indigestion GU:no hematuria, no dysuria MS:no joint pain, no claudication Neuro:no syncope, no lightheadedness Endo:no diabetes, no thyroid disease  All other ROS reviewed and negative.     Physical Exam/Data:   Vitals:   07/31/19 0545 07/31/19 0600 07/31/19 0700 07/31/19 0757  BP: 124/61  (!) 109/59   Pulse: (!) 127 (!) 109 (!) 130   Resp: 15 14 (!) 21   Temp: 97.8 F (36.6 C)   97.6 F (36.4 C)  TempSrc: Oral   Oral  SpO2: 98% 97% 96%     Weight:      Height:        Intake/Output Summary (Last 24 hours) at 07/31/2019 0802 Last data filed at 07/31/2019 0700 Gross per 24 hour  Intake 1459.05 ml  Output --  Net 1459.05 ml   Last 3 Weights 07/30/2019 04/14/2019 07/03/2016  Weight (lbs) 330 lb 229 lb 15 oz 230 lb  Weight (kg) 149.687 kg 104.3 kg 104.327 kg     Body mass index is 53.26 kg/m.  Exam per Dr. Percival Spanish  Relevant CV Studies: ECHO pending   Laboratory Data:  High Sensitivity Troponin:   Recent Labs  Lab 07/31/19 0314 07/31/19 0518  TROPONINIHS 1,711* 1,102*     Chemistry Recent Labs  Lab 07/30/19 2315 07/31/19 0518  NA 136 139  K 3.9 3.6  CL 104 107  CO2 24 23  GLUCOSE 123* 133*  BUN 13 14  CREATININE 1.01* 0.96  CALCIUM 8.2* 7.9*  GFRNONAA >60 >60  GFRAA >60 >60  ANIONGAP 8 9    Recent Labs  Lab 07/30/19 2315  PROT 6.9  ALBUMIN 3.5  AST 23  ALT 18  ALKPHOS 84  BILITOT 0.8   Hematology Recent Labs  Lab 07/30/19 2315 07/31/19 0518  WBC 11.1* 9.8  RBC 3.97 3.60*  HGB 9.6* 8.8*  HCT 33.4* 30.7*  MCV 84.1 85.3  MCH 24.2* 24.4*  MCHC 28.7* 28.7*  RDW 17.9* 17.9*  PLT 342 279   BNPNo results for input(s): BNP, PROBNP in the last 168 hours.  DDimer No results for input(s): DDIMER in the last 168 hours.   Radiology/Studies:  Dg Lumbar Spine Complete  Result Date: 07/30/2019 CLINICAL DATA:  Fall EXAM: LUMBAR SPINE -  COMPLETE 4+ VIEW COMPARISON:  None. FINDINGS: There is no evidence of lumbar spine fracture. Alignment is normal. Disc space narrowing is greatest at L4-5. IMPRESSION: No fracture or listhesis of the lumbar spine. Degenerative disc disease greatest at L4-5. Electronically Signed   By: Ulyses Jarred M.D.   On: 07/30/2019 22:54   Dg Wrist Complete Right  Result Date: 07/30/2019 CLINICAL DATA:  Fall EXAM: RIGHT WRIST - COMPLETE 3+ VIEW COMPARISON:  None. FINDINGS: There is no evidence of fracture or dislocation. There is no evidence of arthropathy or other  focal bone abnormality. Soft tissues are unremarkable. IMPRESSION: Negative. Electronically Signed   By: Ulyses Jarred M.D.   On: 07/30/2019 22:53   Ct Head Wo Contrast  Result Date: 07/30/2019 CLINICAL DATA:  Fall EXAM: CT HEAD WITHOUT CONTRAST CT CERVICAL SPINE WITHOUT CONTRAST TECHNIQUE: Multidetector CT imaging of the head and cervical spine was performed following the standard protocol without intravenous contrast. Multiplanar CT image reconstructions of the cervical spine were also generated. COMPARISON:  04/14/2019 head CT FINDINGS: CT HEAD FINDINGS Brain: There is no mass, hemorrhage or extra-axial collection. The size and configuration of the ventricles and extra-axial CSF spaces are normal. The brain parenchyma is normal, without evidence of acute or chronic infarction. Vascular: No abnormal hyperdensity of the major intracranial arteries or dural venous sinuses. No intracranial atherosclerosis. Skull: The visualized skull base, calvarium and extracranial soft tissues are normal. Sinuses/Orbits: No fluid levels or advanced mucosal thickening of the visualized paranasal sinuses. No mastoid or middle ear effusion. The orbits are normal. CT CERVICAL SPINE FINDINGS Alignment: No static subluxation. Facets are aligned. Occipital condyles are normally positioned. Skull base and vertebrae: No acute fracture. Soft tissues and spinal canal: No prevertebral fluid or swelling. No visible canal hematoma. Disc levels: No advanced spinal canal or neural foraminal stenosis. Upper chest: No pneumothorax, pulmonary nodule or pleural effusion. Other: Normal visualized paraspinal cervical soft tissues. IMPRESSION: 1. No acute intracranial abnormality. 2. No acute fracture or static subluxation of the cervical spine. Electronically Signed   By: Ulyses Jarred M.D.   On: 07/30/2019 23:00   Ct Cervical Spine Wo Contrast  Result Date: 07/30/2019 CLINICAL DATA:  Fall EXAM: CT HEAD WITHOUT CONTRAST CT CERVICAL SPINE  WITHOUT CONTRAST TECHNIQUE: Multidetector CT imaging of the head and cervical spine was performed following the standard protocol without intravenous contrast. Multiplanar CT image reconstructions of the cervical spine were also generated. COMPARISON:  04/14/2019 head CT FINDINGS: CT HEAD FINDINGS Brain: There is no mass, hemorrhage or extra-axial collection. The size and configuration of the ventricles and extra-axial CSF spaces are normal. The brain parenchyma is normal, without evidence of acute or chronic infarction. Vascular: No abnormal hyperdensity of the major intracranial arteries or dural venous sinuses. No intracranial atherosclerosis. Skull: The visualized skull base, calvarium and extracranial soft tissues are normal. Sinuses/Orbits: No fluid levels or advanced mucosal thickening of the visualized paranasal sinuses. No mastoid or middle ear effusion. The orbits are normal. CT CERVICAL SPINE FINDINGS Alignment: No static subluxation. Facets are aligned. Occipital condyles are normally positioned. Skull base and vertebrae: No acute fracture. Soft tissues and spinal canal: No prevertebral fluid or swelling. No visible canal hematoma. Disc levels: No advanced spinal canal or neural foraminal stenosis. Upper chest: No pneumothorax, pulmonary nodule or pleural effusion. Other: Normal visualized paraspinal cervical soft tissues. IMPRESSION: 1. No acute intracranial abnormality. 2. No acute fracture or static subluxation of the cervical spine. Electronically Signed   By: Cletus Gash.D.  On: 07/30/2019 23:00   Dg Chest Port 1 View  Result Date: 07/30/2019 CLINICAL DATA:  Cough and fever EXAM: PORTABLE CHEST 1 VIEW COMPARISON:  04/14/2019 FINDINGS: The heart size and mediastinal contours are within normal limits. Both lungs are clear. The visualized skeletal structures are unremarkable. IMPRESSION: No active disease. Electronically Signed   By: Ulyses Jarred M.D.   On: 07/30/2019 22:55    Assessment  and Plan:   1. PAF with RVR now on IV heparin and IV dilt, in combination with sepsis. Mostly rate controlled.   Manage with rate control and anticoagulation, CHA2DS2VASc of 1,  If rate can be controlled then d/c with oral anticoagulation and plan DCCV in 3 weeks and anticoagulation for 4 weeks post DCCV.   Med for rate control depending on EF as well.  Echo pending. Dr. Percival Spanish to see. 2. Chest pain that began with a fib.  Her troponin is elevated, 1711>>1102  echo pending.  May be demand ischemia  With sepsis and a fib.  Though may be prudent to do ischemic eval once improved from sepsis. 3. Sepsis and PNA on ABX, respiratory panel not back, COVID neg and flu neg. 4. Morbid obesity  Do not see sleep study in chart, may need sleep study.       Pt on precautions awaiting second COVID result to return  For questions or updates, please contact [facility] Please consult [email or web address] for contact info under     Signed, Cecilie Kicks, NP  07/31/2019 8:02 AM  History and all data above reviewed.  Patient examined.  I agree with the findings as above.    The patient exam reveals     GENERAL:  Well appearing HEENT:  Pupils equal round and reactive, fundi not visualized, oral mucosa unremarkable NECK:  No jugular venous distention, waveform within normal limits, carotid upstroke brisk and symmetric, no bruits, no thyromegaly LYMPHATICS:  No cervical adneopathy LUNGS:  Decreased breath sounds without crackles BACK:  No CVA tenderness CHEST:  Unremarkable HEART:  PMI not displaced or sustained,S1 and S2 within normal limits, no S3,  no clicks, no rubs, no murmurs, irregular ABD:  Flat, positive bowel sounds normal in frequency in pitch, no bruits, no rebound, no guarding, no midline pulsatile mass, no hepatomegaly, no splenomegaly EXT:  2 plus pulses throughout, no edema, no cyanosis no clubbing SKIN:  No rashes no nodules NEURO:  Cranial nerves II through XII grossly intact, motor grossly  intact throughout PSYCH:  Cognitively intact, oriented to person place and time   Agree with documented assessment and plan. Atrial fib likely related to acute pulmonary process.  For today rate control and anticoagulation.  We can switch to Lovenox full dose.  No DOAC at this time.  She does need an echocardiogram.  Rate control today with IV Dilt.  If she does not convert I will change to PO and consider DOAC.  Elevated enzyme likely secondary to atrial fib.  Check echo as above and I will plan likely a non invasive evaluation.      Jeneen Rinks Lashann Hagg  12:11 PM  07/31/2019

## 2019-07-31 NOTE — Progress Notes (Signed)
  Echocardiogram 2D Echocardiogram has been performed.  Tammy [name] 07/31/2019, 3:27 PM

## 2019-07-31 NOTE — Progress Notes (Signed)
ANTICOAGULATION CONSULT NOTE - Initial Consult  Pharmacy Consult for IV heparin Indication: chest pain/ACS  Allergies  Allergen Reactions  . Nsaids Other (See Comments)    Post Gastric Bypass  . Adhesive [Tape] Itching and Rash  . Latex Itching and Rash  . Sulfa Antibiotics Itching and Rash    Patient Measurements: Height: 5\' 6"  (167.6 cm) Weight: (!) 330 lb (149.7 kg) IBW/kg (Calculated) : 59.3 Heparin Dosing Weight: 86 kg  Vital Signs: Temp: 98.1 F (36.7 C) (10/27 0349) Temp Source: Oral (10/27 0349) BP: 103/74 (10/27 0410) Pulse Rate: 111 (10/27 0410)  Labs: Recent Labs    07/30/19 2315 07/31/19 0314  HGB 9.6*  --   HCT 33.4*  --   PLT 342  --   APTT 31  --   LABPROT 14.1  --   INR 1.1  --   CREATININE 1.01*  --   TROPONINIHS  --  1,711*    Estimated Creatinine Clearance: 93.8 mL/min (A) (by C-G formula based on SCr of 1.01 mg/dL (H)).   Medical History: Past Medical History:  Diagnosis Date  . Anxiety   . Arthritis    neck and lower back - deg disc disease, fingers  . Depression   . Frequency of urination   . GERD (gastroesophageal reflux disease)   . History of seizure per pt no seizure since    11-07-2015  in setting of stress and sleep deprivation  /  negative work-up by neurologist unknown idiology  . Hypothyroidism   . Injury of thumb, left    03-31-2016    . Pelvic relaxation   . SUI (stress urinary incontinence, female)   . Wears glasses     Medications:  Scheduled:  . buPROPion  300 mg Oral Daily  . FLUoxetine  20 mg Oral QHS  . gabapentin  1,200 mg Oral TID  . OLANZapine  10 mg Oral QHS   Infusions:  . sodium chloride 1,000 mL (07/30/19 2307)  . azithromycin Stopped (07/31/19 0128)  . cefTRIAXone (ROCEPHIN)  IV Stopped (07/30/19 2341)  . diltiazem (CARDIZEM) infusion 7.5 mg/hr (07/31/19 0304)    Assessment: 57 yoF s/p fall and fever. IV heparin for ACS.  Baseline labs: H/H=9.6/33.4, plts = 342, aptt = 31, INR = 1.1  Goal  of Therapy:  Heparin level 0.3-0.7 units/ml Monitor platelets by anticoagulation protocol: Yes   Plan:  Heparin 4000 unit bolus x1 now Start drip at 1100 units/hr Daily CBC/HL Check 1st HL in 6 hours  Lawana Pai R 07/31/2019,4:22 AM

## 2019-07-31 NOTE — ED Notes (Signed)
Paged Dr. Alcario Drought regarding Trop results to determine if pt needs to still be admitted to [facility]

## 2019-07-31 NOTE — ED Notes (Signed)
Pt now c/o chest tightness and pressure. Dr. Alcario Drought notified, awaiting orders.

## 2019-07-31 NOTE — Progress Notes (Addendum)
PROGRESS NOTE    Tammy [name]  YKZ:[id number] DOB: 03/23/1961 DOA: 07/30/2019 PCP: Fanny Bien, MD    Brief Narrative:   Tammy [name] is a 58 y.o. female with medical history significant of obesity, depression/anxiety, urinary incontinence who presented to the ED for evaluation after a fall at home. She reports progressive fatigue since yesterday but she was up and walking around. Patient slipped out of her bed and landed on her bottom. She had pain in her neck and back as well as her right wrist. EMS was called because of her fall.  EMS noted that she had low O2 sats (low 80s) on room air and put her on O2.  She does not normally need O2 at home at baseline.  Patient denies fevers (subjectively) but has had mild cough at home.No vomiting, diarrhea, abd pain. Maybe slight dysuria.  ED Course: Tm 101.6, new O2 requirement as noted above.  Pt with A.Fib RVR up to 170s, now improved to 120s-140s on cardizem gtt. WBC 11.1k. CXR negative. Patient started on ceftriaxone and azithromycin empirically for presumed community acquired pneumonia.     Assessment & Plan:   Principal Problem:   CAP (community acquired pneumonia) Active Problems:   Acute respiratory failure with hypoxia (HCC)   Atrial fibrillation with RVR (HCC)   Sepsis, present on admission Acute hypoxic respiratory failure Community acquired pneumonia Patient was noted to be hypoxic on EMS arrival with oxygen saturations in the low 80s.  Not on home oxygen at baseline.  Patient was febrile up to 101.6 on arrival with elevated white count of 11.1.  Lactic acid 1.4.  Covid-19/SARS-CoV-2 negative.  Respiratory viral panel negative.  Rapid influenza negative.  Chest x-ray report shows no active disease by review of images with some mild consolidation appreciated right middle/lower lobe. --WBC 11.1-->9.8 --Pending repeat Covid-19/SARS-CoV-2 test to ensure initial not a false negative --Continue airborne  isolation/contact precautions --Continue antibiotics with azithromycin 510m IV daily and ceftriaxone 2 gm IV q24h --Titrate supplemental oxygen to maintain SPO2 greater than 92% --Supportive care, antipyretics  New onset proximal atrial fibrillation with RVR Etiology likely provoked from underlying sepsis as above.  No previous history afib.  Heart rate elevated to 176 on initial presentation.  EKG notable for atrial fibrillation.  CHA2DS2VASc score 2 (1 for age and likely 1 for undiagnosed HTN). --Cardiology following, appreciate assistance --Echocardiogram: Pending --Continue diltiazem drip --Cardiology transitioning heparin drip to treatment dose Lovenox --Continue to monitor on telemetry  Elevated troponin Troponin 1711-->1162.  Etiology likely from demand ischemia from atrial fibrillation with RVR as above.  Currently chest pain-free. --Continue rate control with Cardizem as above --Treatment dose Lovenox as above --Continue to monitor on telemetry  Morbid obesity BMI 53.26, discussed with patient extensively need for aggressive lifestyle changes and weight loss measures as this complicates all facets of care.  Fall Patient presenting with fall at home, denies syncopal episode.  CT head/C-spine negative for acute intracranial pathology or fracture/subluxation.  X-ray lumbar spine with degenerative disc disease no acute fracture/subluxation, right wrist with no acute fractures. --PT consultation   DVT prophylaxis: Lovenox treatment dose 150 mg subcutaneously every 12 hours Code Status: Full code Family Communication: remain inpatient in the stepdown unit on Cardizem drip Disposition Plan: Anticipate discharge home when medically ready.   Consultants:   Cardiology  Procedures:  Transthoracic echocardiogram: Pending   Antimicrobials:   Azithromycin 10/26>>  Ceftriaxone 10/26>>   Subjective: Patient seen and examined bedside, resting comfortably.  Continues  with  mild dyspnea.  Nursing reports urinary and fecal incontinence this morning.  No other specific complaints.  Denies syncopal episode yesterday prior to presentation.  Now chest pain-free.  Heart rate in better control on Cardizem drip.  Awaiting repeat Covid-19 test.  Denies headache, no chills/night sweats, no nausea cefonicid/diarrhea, no chest pain, no palpitations, no abdominal pain.  No other acute events overnight per nursing staff.  Objective: Vitals:   07/31/19 0800 07/31/19 0900 07/31/19 1000 07/31/19 1151  BP: 117/71 119/76 (!) 166/122 137/72  Pulse: (!) 112 (!) 103    Resp: (!) 23 14 18 16   Temp:      TempSrc:      SpO2: 96% 97%    Weight:      Height:        Intake/Output Summary (Last 24 hours) at 07/31/2019 1205 Last data filed at 07/31/2019 0700 Gross per 24 hour  Intake 1459.05 ml  Output --  Net 1459.05 ml   Filed Weights   07/30/19 2343  Weight: (!) 149.7 kg    Examination:  General exam: Appears calm and comfortable, obese Respiratory system: Clear to auscultation. Respiratory effort normal.  On 2 L nasal cannula oxygenating 97% Cardiovascular system: S1 & S2 heard, tachycardic, irregularly irregular rhythm. No JVD, murmurs, rubs, gallops or clicks. No pedal edema. Gastrointestinal system: Abdomen is nondistended, soft and nontender. No organomegaly or masses felt. Normal bowel sounds heard. Central nervous system: Alert and oriented. No focal neurological deficits. Extremities: Symmetric 5 x 5 power. Skin: No rashes, lesions or ulcers Psychiatry: Judgement and insight appear normal. Mood & affect appropriate.     Data Reviewed: I have personally reviewed following labs and imaging studies  CBC: Recent Labs  Lab 07/30/19 2315 07/31/19 0518  WBC 11.1* 9.8  NEUTROABS 8.4*  --   HGB 9.6* 8.8*  HCT 33.4* 30.7*  MCV 84.1 85.3  PLT 342 149   Basic Metabolic Panel: Recent Labs  Lab 07/30/19 2315 07/31/19 0518  NA 136 139  K 3.9 3.6  CL 104 107   CO2 24 23  GLUCOSE 123* 133*  BUN 13 14  CREATININE 1.01* 0.96  CALCIUM 8.2* 7.9*   GFR: Estimated Creatinine Clearance: 98.7 mL/min (by C-G formula based on SCr of 0.96 mg/dL). Liver Function Tests: Recent Labs  Lab 07/30/19 2315  AST 23  ALT 18  ALKPHOS 84  BILITOT 0.8  PROT 6.9  ALBUMIN 3.5   No results for input(s): LIPASE, AMYLASE in the last 168 hours. No results for input(s): AMMONIA in the last 168 hours. Coagulation Profile: Recent Labs  Lab 07/30/19 2315  INR 1.1   Cardiac Enzymes: No results for input(s): CKTOTAL, CKMB, CKMBINDEX, TROPONINI in the last 168 hours. BNP (last 3 results) No results for input(s): PROBNP in the last 8760 hours. HbA1C: No results for input(s): HGBA1C in the last 72 hours. CBG: No results for input(s): GLUCAP in the last 168 hours. Lipid Profile: No results for input(s): CHOL, HDL, LDLCALC, TRIG, CHOLHDL, LDLDIRECT in the last 72 hours. Thyroid Function Tests: No results for input(s): TSH, T4TOTAL, FREET4, T3FREE, THYROIDAB in the last 72 hours. Anemia Panel: No results for input(s): VITAMINB12, FOLATE, FERRITIN, TIBC, IRON, RETICCTPCT in the last 72 hours. Sepsis Labs: Recent Labs  Lab 07/30/19 2315  LATICACIDVEN 1.4    Recent Results (from the past 240 hour(s))  Blood Culture (routine x 2)     Status: None (Preliminary result)   Collection Time: 07/30/19 11:15 PM  Specimen: BLOOD  Result Value Ref Range Status   Specimen Description   Final    BLOOD LEFT ANTECUBITAL Performed at [facility] [address], [place], [place] [address]    Special Requests   Final    BOTTLES DRAWN AEROBIC AND ANAEROBIC Blood Culture adequate volume Performed at [facility] [address] [place], [place] [address]    Culture   Final    NO GROWTH < 12 HOURS Performed at [facility] [address] [place], [place] [address]    Report Status PENDING  Incomplete  Blood Culture  (routine x 2)     Status: None (Preliminary result)   Collection Time: 07/30/19 11:15 PM   Specimen: BLOOD  Result Value Ref Range Status   Specimen Description   Final    BLOOD BLOOD LEFT FOREARM Performed at [facility] [address] [place], [place] [address]    Special Requests   Final    Blood Culture results may not be optimal due to an inadequate volume of blood received in culture bottles BOTTLES DRAWN AEROBIC AND ANAEROBIC Performed at [facility], [place] [address] [place], [place] [address]    Culture   Final    NO GROWTH < 12 HOURS Performed at [facility] [address] [place], [place] [address]    Report Status PENDING  Incomplete  SARS Coronavirus 2 by RT PCR (hospital order, performed in [facility] hospital lab) Nasopharyngeal Nasopharyngeal Swab     Status: None   Collection Time: 07/30/19 11:38 PM   Specimen: Nasopharyngeal Swab  Result Value Ref Range Status   SARS Coronavirus 2 NEGATIVE NEGATIVE Final    Comment: (NOTE) If result is NEGATIVE SARS-CoV-2 target nucleic acids are NOT DETECTED. The SARS-CoV-2 RNA is generally detectable in upper and lower  respiratory specimens during the acute phase of infection. The lowest  concentration of SARS-CoV-2 viral copies this assay can detect is 250  copies / mL. A negative result does not preclude SARS-CoV-2 infection  and should not be used as the sole basis for treatment or other  patient management decisions.  A negative result may occur with  improper specimen collection / handling, submission of specimen other  than nasopharyngeal swab, presence of viral mutation(s) within the  areas targeted by this assay, and inadequate number of viral copies  (<250 copies / mL). A negative result must be combined with clinical  observations, patient history, and epidemiological information. If result is POSITIVE SARS-CoV-2 target nucleic acids are DETECTED. The SARS-CoV-2  RNA is generally detectable in upper and lower  respiratory specimens dur ing the acute phase of infection.  Positive  results are indicative of active infection with SARS-CoV-2.  Clinical  correlation with patient history and other diagnostic information is  necessary to determine patient infection status.  Positive results do  not rule out bacterial infection or co-infection with other viruses. If result is PRESUMPTIVE POSTIVE SARS-CoV-2 nucleic acids MAY BE PRESENT.   A presumptive positive result was obtained on the submitted specimen  and confirmed on repeat testing.  While 2019 novel coronavirus  (SARS-CoV-2) nucleic acids may be present in the submitted sample  additional confirmatory testing may be necessary for epidemiological  and / or clinical management purposes  to differentiate between  SARS-CoV-2 and other Sarbecovirus currently known to infect humans.  If clinically indicated additional testing with an alternate test  methodology [phone]) is advised. The  SARS-CoV-2 RNA is generally  detectable in upper and lower respiratory sp ecimens during the acute  phase of infection. The expected result is Negative. Fact Sheet for Patients:  StrictlyIdeas.no Fact Sheet for Healthcare Providers: BankingDealers.co.za This test is not yet approved or cleared by the [place] FDA and has been authorized for detection and/or diagnosis of SARS-CoV-2 by FDA under an Emergency Use Authorization (EUA).  This EUA will remain in effect (meaning this test can be used) for the duration of the COVID-19 declaration under Section 564(b)(1) of the Act, 21 U.S.C. section 360bbb-3(b)(1), unless the authorization is terminated or revoked sooner. Performed at [facility], [place] [address] [place], [place] [address]   Respiratory Panel by PCR     Status: None   Collection Time: 07/31/19  4:19 AM   Specimen: Flu Kit Nasopharyngeal  Swab; Respiratory  Result Value Ref Range Status   Adenovirus NOT DETECTED NOT DETECTED Final   Coronavirus 229E NOT DETECTED NOT DETECTED Final    Comment: (NOTE) The Coronavirus on the Respiratory Panel, DOES NOT test for the novel  Coronavirus (2019 nCoV)    Coronavirus HKU1 NOT DETECTED NOT DETECTED Final   Coronavirus NL63 NOT DETECTED NOT DETECTED Final   Coronavirus OC43 NOT DETECTED NOT DETECTED Final   Metapneumovirus NOT DETECTED NOT DETECTED Final   Rhinovirus / Enterovirus NOT DETECTED NOT DETECTED Final   Influenza A NOT DETECTED NOT DETECTED Final   Influenza B NOT DETECTED NOT DETECTED Final   Parainfluenza Virus 1 NOT DETECTED NOT DETECTED Final   Parainfluenza Virus 2 NOT DETECTED NOT DETECTED Final   Parainfluenza Virus 3 NOT DETECTED NOT DETECTED Final   Parainfluenza Virus 4 NOT DETECTED NOT DETECTED Final   Respiratory Syncytial Virus NOT DETECTED NOT DETECTED Final   Bordetella pertussis NOT DETECTED NOT DETECTED Final   Chlamydophila pneumoniae NOT DETECTED NOT DETECTED Final   Mycoplasma pneumoniae NOT DETECTED NOT DETECTED Final    Comment: Performed at [facility], [place]. [address], [place], [place] [address]  MRSA PCR Screening     Status: None   Collection Time: 07/31/19  6:10 AM   Specimen: Nasal Mucosa; Nasopharyngeal  Result Value Ref Range Status   MRSA by PCR NEGATIVE NEGATIVE Final    Comment:        The GeneXpert MRSA Assay (FDA approved for NASAL specimens only), is one component of a comprehensive MRSA colonization surveillance program. It is not intended to diagnose MRSA infection nor to guide or monitor treatment for MRSA infections. Performed at [facility], [place] [address] [place], [place] [address]          Radiology Studies: Dg Lumbar Spine Complete  Result Date: 07/30/2019 CLINICAL DATA:  Fall EXAM: LUMBAR SPINE - COMPLETE 4+ VIEW COMPARISON:  None. FINDINGS: There is no evidence of lumbar spine  fracture. Alignment is normal. Disc space narrowing is greatest at L4-5. IMPRESSION: No fracture or listhesis of the lumbar spine. Degenerative disc disease greatest at L4-5. Electronically Signed   By: Ulyses Jarred M.D.   On: 07/30/2019 22:54   Dg Wrist Complete Right  Result Date: 07/30/2019 CLINICAL DATA:  Fall EXAM: RIGHT WRIST - COMPLETE 3+ VIEW COMPARISON:  None. FINDINGS: There is no evidence of fracture or dislocation. There is no evidence of arthropathy or other focal bone abnormality. Soft tissues are unremarkable. IMPRESSION: Negative. Electronically Signed   By: Ulyses Jarred M.D.   On: 07/30/2019 22:53   Ct Head Wo  Contrast  Result Date: 07/30/2019 CLINICAL DATA:  Fall EXAM: CT HEAD WITHOUT CONTRAST CT CERVICAL SPINE WITHOUT CONTRAST TECHNIQUE: Multidetector CT imaging of the head and cervical spine was performed following the standard protocol without intravenous contrast. Multiplanar CT image reconstructions of the cervical spine were also generated. COMPARISON:  04/14/2019 head CT FINDINGS: CT HEAD FINDINGS Brain: There is no mass, hemorrhage or extra-axial collection. The size and configuration of the ventricles and extra-axial CSF spaces are normal. The brain parenchyma is normal, without evidence of acute or chronic infarction. Vascular: No abnormal hyperdensity of the major intracranial arteries or dural venous sinuses. No intracranial atherosclerosis. Skull: The visualized skull base, calvarium and extracranial soft tissues are normal. Sinuses/Orbits: No fluid levels or advanced mucosal thickening of the visualized paranasal sinuses. No mastoid or middle ear effusion. The orbits are normal. CT CERVICAL SPINE FINDINGS Alignment: No static subluxation. Facets are aligned. Occipital condyles are normally positioned. Skull base and vertebrae: No acute fracture. Soft tissues and spinal canal: No prevertebral fluid or swelling. No visible canal hematoma. Disc levels: No advanced spinal  canal or neural foraminal stenosis. Upper chest: No pneumothorax, pulmonary nodule or pleural effusion. Other: Normal visualized paraspinal cervical soft tissues. IMPRESSION: 1. No acute intracranial abnormality. 2. No acute fracture or static subluxation of the cervical spine. Electronically Signed   By: Ulyses Jarred M.D.   On: 07/30/2019 23:00   Ct Cervical Spine Wo Contrast  Result Date: 07/30/2019 CLINICAL DATA:  Fall EXAM: CT HEAD WITHOUT CONTRAST CT CERVICAL SPINE WITHOUT CONTRAST TECHNIQUE: Multidetector CT imaging of the head and cervical spine was performed following the standard protocol without intravenous contrast. Multiplanar CT image reconstructions of the cervical spine were also generated. COMPARISON:  04/14/2019 head CT FINDINGS: CT HEAD FINDINGS Brain: There is no mass, hemorrhage or extra-axial collection. The size and configuration of the ventricles and extra-axial CSF spaces are normal. The brain parenchyma is normal, without evidence of acute or chronic infarction. Vascular: No abnormal hyperdensity of the major intracranial arteries or dural venous sinuses. No intracranial atherosclerosis. Skull: The visualized skull base, calvarium and extracranial soft tissues are normal. Sinuses/Orbits: No fluid levels or advanced mucosal thickening of the visualized paranasal sinuses. No mastoid or middle ear effusion. The orbits are normal. CT CERVICAL SPINE FINDINGS Alignment: No static subluxation. Facets are aligned. Occipital condyles are normally positioned. Skull base and vertebrae: No acute fracture. Soft tissues and spinal canal: No prevertebral fluid or swelling. No visible canal hematoma. Disc levels: No advanced spinal canal or neural foraminal stenosis. Upper chest: No pneumothorax, pulmonary nodule or pleural effusion. Other: Normal visualized paraspinal cervical soft tissues. IMPRESSION: 1. No acute intracranial abnormality. 2. No acute fracture or static subluxation of the cervical  spine. Electronically Signed   By: Ulyses Jarred M.D.   On: 07/30/2019 23:00   Dg Chest Port 1 View  Result Date: 07/30/2019 CLINICAL DATA:  Cough and fever EXAM: PORTABLE CHEST 1 VIEW COMPARISON:  04/14/2019 FINDINGS: The heart size and mediastinal contours are within normal limits. Both lungs are clear. The visualized skeletal structures are unremarkable. IMPRESSION: No active disease. Electronically Signed   By: Ulyses Jarred M.D.   On: 07/30/2019 22:55        Scheduled Meds:  buPROPion  300 mg Oral Daily   Chlorhexidine Gluconate Cloth  6 each Topical Q0600   FLUoxetine  20 mg Oral QHS   gabapentin  1,200 mg Oral TID   mouth rinse  15 mL Mouth Rinse BID  OLANZapine  10 mg Oral QHS   Continuous Infusions:  sodium chloride 1,000 mL (07/31/19 0956)   azithromycin Stopped (07/31/19 0051)   cefTRIAXone (ROCEPHIN)  IV Stopped (07/30/19 2336)   diltiazem (CARDIZEM) infusion 10 mg/hr (07/31/19 0700)   heparin 1,100 Units/hr (07/31/19 0700)     LOS: 0 days    Time spent: 39 minutes spent on chart review, discussion with nursing staff, consultants, updating family and interview/physical exam; more than 50% of that time was spent in counseling and/or coordination of care.    Maribel Hadley J [place] DO [facility] Pager [phone]  If 7PM-7AM, please contact night-coverage [email or web address] Password [email or web address] 07/31/2019, 12:05 PM

## 2019-07-31 NOTE — ED Notes (Signed)
Pt SBP in low 90's, EDP made aware, verbal order of NS 561mL bolus given. Bolus hung. Pt A&O x4.

## 2019-07-31 NOTE — ED Notes (Signed)
Stepdown notified that pt will be holding in ED until further notice due to chest pain.

## 2019-08-01 ENCOUNTER — Telehealth: Payer: Self-pay | Admitting: Cardiology

## 2019-08-01 DIAGNOSIS — F411 Generalized anxiety disorder: Secondary | ICD-10-CM

## 2019-08-01 DIAGNOSIS — Z6841 Body Mass Index (BMI) 40.0 and over, adult: Secondary | ICD-10-CM

## 2019-08-01 DIAGNOSIS — F329 Major depressive disorder, single episode, unspecified: Secondary | ICD-10-CM

## 2019-08-01 LAB — BASIC METABOLIC PANEL
Anion gap: 8 (ref 5–15)
BUN: 10 mg/dL (ref 6–20)
CO2: 22 mmol/L (ref 22–32)
Calcium: 7.9 mg/dL — ABNORMAL LOW (ref 8.9–10.3)
Chloride: 111 mmol/L (ref 98–111)
Creatinine, Ser: 0.67 mg/dL (ref 0.44–1.00)
GFR calc Af Amer: >60 mL/min (ref >60)
GFR calc non Af Amer: >60 mL/min (ref >60)
Glucose, Bld: 104 mg/dL — ABNORMAL HIGH (ref 70–99)
Potassium: 3.2 mmol/L — ABNORMAL LOW (ref 3.5–5.1)
Sodium: 141 mmol/L (ref 135–145)

## 2019-08-01 LAB — LIPID PANEL
Cholesterol: 160 mg/dL (ref 0–200)
HDL: 47 mg/dL (ref >40)
LDL Cholesterol: 98 mg/dL (ref 0–99)
Total CHOL/HDL Ratio: 3.4 RATIO
Triglycerides: 77 mg/dL (ref <150)
VLDL: 15 mg/dL (ref 0–40)

## 2019-08-01 LAB — CBC
HCT: 30.2 % — ABNORMAL LOW (ref 36.0–46.0)
Hemoglobin: 8.7 g/dL — ABNORMAL LOW (ref 12.0–15.0)
MCH: 24.6 pg — ABNORMAL LOW (ref 26.0–34.0)
MCHC: 28.8 g/dL — ABNORMAL LOW (ref 30.0–36.0)
MCV: 85.3 fL (ref 80.0–100.0)
Platelets: 256 10*3/uL (ref 150–400)
RBC: 3.54 MIL/uL — ABNORMAL LOW (ref 3.87–5.11)
RDW: 17.9 % — ABNORMAL HIGH (ref 11.5–15.5)
WBC: 8.1 10*3/uL (ref 4.0–10.5)
nRBC: 0 % (ref 0.0–0.2)

## 2019-08-01 LAB — TSH: TSH: 0.432 u[IU]/mL (ref 0.350–4.500)

## 2019-08-01 LAB — HEMOGLOBIN A1C
Hgb A1c MFr Bld: 5.3 % (ref 4.8–5.6)
Mean Plasma Glucose: 105.41 mg/dL

## 2019-08-01 LAB — PROCALCITONIN: Procalcitonin: 2.35 ng/mL

## 2019-08-01 LAB — MAGNESIUM: Magnesium: 2.1 mg/dL (ref 1.7–2.4)

## 2019-08-01 MED ORDER — APIXABAN 5 MG PO TABS
5.0000 mg | ORAL_TABLET | Freq: Two times a day (BID) | ORAL | Status: DC
  Administered 2019-08-01 – 2019-08-02 (×3): 5 mg via ORAL
  Filled 2019-08-01 (×3): qty 1

## 2019-08-01 MED ORDER — LOPERAMIDE HCL 2 MG PO CAPS
4.0000 mg | ORAL_CAPSULE | ORAL | Status: DC | PRN
  Administered 2019-08-01: 4 mg via ORAL
  Filled 2019-08-01: qty 2

## 2019-08-01 MED ORDER — POTASSIUM CHLORIDE CRYS ER 20 MEQ PO TBCR
40.0000 meq | EXTENDED_RELEASE_TABLET | Freq: Once | ORAL | Status: AC
  Administered 2019-08-01: 40 meq via ORAL
  Filled 2019-08-01: qty 2

## 2019-08-01 MED ORDER — DILTIAZEM HCL ER COATED BEADS 180 MG PO CP24
300.0000 mg | ORAL_CAPSULE | Freq: Every day | ORAL | Status: DC
  Administered 2019-08-01 – 2019-08-02 (×2): 300 mg via ORAL
  Filled 2019-08-01 (×2): qty 1

## 2019-08-01 MED ORDER — AZITHROMYCIN 250 MG PO TABS
500.0000 mg | ORAL_TABLET | Freq: Every day | ORAL | Status: DC
  Administered 2019-08-01: 500 mg via ORAL
  Filled 2019-08-01: qty 2

## 2019-08-01 NOTE — Progress Notes (Signed)
Rec'd pt from ICU. Condition stable. VSS. Afib rate controlled at the time of this note. Pt reports loose stool related to her gastric bypass procedure. Cont with plan of care

## 2019-08-01 NOTE — Progress Notes (Signed)
PROGRESS NOTE    Tammy [name]  ONG:[id number] DOB: 1961-08-30 DOA: 07/30/2019 PCP: Fanny Bien, MD    Brief Narrative:   Tammy [name] is a 58 y.o. female with medical history significant of obesity, depression/anxiety, urinary incontinence who presented to the ED for evaluation after a fall at home. She reports progressive fatigue since yesterday but she was up and walking around. Patient slipped out of her bed and landed on her bottom. She had pain in her neck and back as well as her right wrist. EMS was called because of her fall.  EMS noted that she had low O2 sats (low 80s) on room air and put her on O2.  She does not normally need O2 at home at baseline.  Patient denies fevers (subjectively) but has had mild cough at home.No vomiting, diarrhea, abd pain. Maybe slight dysuria.  ED Course: Tmax 101.6, new O2 requirement as noted above.  Pt with A.Fib RVR up to 170s, now improved to 120s-140s on cardizem gtt. WBC 11.1k. CXR negative. Patient started on ceftriaxone and azithromycin empirically for presumed community acquired pneumonia.     Assessment & Plan:   Principal Problem:   CAP (community acquired pneumonia) Active Problems:   Depression   Generalized anxiety disorder   Acute respiratory failure with hypoxia (HCC)   Atrial fibrillation with RVR (HCC)   Sepsis ([place])   Morbid obesity with BMI of 50.0-59.9, adult (HCC)   Sepsis, present on admission Acute hypoxic respiratory failure Community acquired pneumonia Patient was noted to be hypoxic on EMS arrival with oxygen saturations in the low 80s.  Not on home oxygen at baseline.  Patient was febrile up to 101.6 on arrival with elevated white count of 11.1.  Lactic acid 1.4.  Covid-19/SARS-CoV-2 negative x 2.  Respiratory viral panel negative.  Rapid influenza negative.  Chest x-ray report shows no active disease by review of images with some mild consolidation appreciated right middle/lower lobe. --WBC  11.1-->9.8-->8.1 --Afebrile past 24 hours --Continue antibiotics with azithromycin 583m IV daily and ceftriaxone 2 gm IV q24h --check procalcitonin, if negative will continue antibiotics --Titrate supplemental oxygen to maintain SPO2 greater than 92% --Supportive care, antipyretics  New onset paroxysmal atrial fibrillation with RVR Etiology likely provoked from underlying sepsis as above.  No previous history afib.  Heart rate elevated to 176 on initial presentation.  EKG notable for atrial fibrillation.  CHA2DS2VASc score 2 (1 for age and likely 1 for undiagnosed HTN). Echocardiogram: EF 60-65%, IVC dilated, RA/LA moderate dilation.  TSH 0.432, within normal limits. --Cardiology following, appreciate assistance --diltiazem drip transitioned to Cardizem 3039mPO daily 10/28 --Eliquis 5 mg p.o. twice daily --Continue to monitor on telemetry  Elevated troponin Troponin 1711-->1162.  Etiology likely from demand ischemia from atrial fibrillation with RVR as above.  Currently chest pain-free. --Continue rate control with Cardizem as above --Continue to monitor on telemetry  Morbid obesity BMI 53.26, discussed with patient extensively need for aggressive lifestyle changes and weight loss measures as this complicates all facets of care.  Fall Patient presenting with fall at home, denies syncopal episode.  CT head/C-spine negative for acute intracranial pathology or fracture/subluxation.  X-ray lumbar spine with degenerative disc disease no acute fracture/subluxation, right wrist with no acute fractures. --PT consultation   DVT prophylaxis: Eliquis Code Status: Full code Family Communication: Transfer to telemetry, awaiting PT evaluation, hopeful discharge home in 1-2 days Disposition Plan: Anticipate discharge home when medically ready.   Consultants:   Cardiology  Procedures:  Transthoracic echocardiogram:  IMPRESSIONS    1. Left ventricular ejection fraction, by visual  estimation, is 60 to 65%. The left ventricle has normal function. Left ventricular septal wall thickness was mildly increased. Normal left ventricular posterior wall thickness. There is mildly increased  left ventricular hypertrophy.  2. Global right ventricle has normal systolic function.The right ventricular size is mildly enlarged. No increase in right ventricular wall thickness.  3. Left atrial size was moderately dilated.  4. Right atrial size was moderately dilated.  5. The mitral valve is normal in structure. No evidence of mitral valve regurgitation.  6. The tricuspid valve is normal in structure. Tricuspid valve regurgitation is trivial.  7. The aortic valve is normal in structure. Aortic valve regurgitation was not visualized by color flow Doppler.  8. The pulmonic valve was normal in structure. Pulmonic valve regurgitation is not visualized by color flow Doppler.  9. Mildly elevated pulmonary artery systolic pressure. 10. The inferior vena cava is dilated in size with <50% respiratory variability, suggesting right atrial pressure of 15 mmHg.    Antimicrobials:   Azithromycin 10/26>>  Ceftriaxone 10/26>>   Subjective: Patient seen and examined bedside, resting comfortably. Dyspnea now resolved.  Complains of some diarrhea this morning.  No other complaints.  Denies headache, no chills/night sweats, no nausea/vomiting/diarrhea, no chest pain, no palpitations, no shortness of breath, no abdominal pain.  No other acute events overnight per nursing staff.  Objective: Vitals:   08/01/19 0850 08/01/19 1000 08/01/19 1100 08/01/19 1200  BP:  (!) 142/54 (!) 157/71 (!) 160/82  Pulse:  98 (!) 51 85  Resp:  (!) 31 (!) 24 (!) 21  Temp:      TempSrc:      SpO2: 96% 96% 96% 96%  Weight:      Height:        Intake/Output Summary (Last 24 hours) at 08/01/2019 1229 Last data filed at 08/01/2019 1202 Gross per 24 hour  Intake 4094.44 ml  Output 2000 ml  Net 2094.44 ml   Filed  Weights   07/30/19 2343  Weight: (!) 149.7 kg    Examination:  General exam: Appears calm and comfortable, obese Respiratory system: Clear to auscultation. Respiratory effort normal.  On 2 L nasal cannula oxygenating 97% Cardiovascular system: S1 & S2 heard, tachycardic, irregularly irregular rhythm. No JVD, murmurs, rubs, gallops or clicks. No pedal edema. Gastrointestinal system: Abdomen is nondistended, soft and nontender. No organomegaly or masses felt. Normal bowel sounds heard. Central nervous system: Alert and oriented. No focal neurological deficits. Extremities: Symmetric 5 x 5 power. Skin: No rashes, lesions or ulcers Psychiatry: Judgement and insight appear normal. Mood & affect appropriate.     Data Reviewed: I have personally reviewed following labs and imaging studies  CBC: Recent Labs  Lab 07/30/19 2315 07/31/19 0518 08/01/19 0210  WBC 11.1* 9.8 8.1  NEUTROABS 8.4*  --   --   HGB 9.6* 8.8* 8.7*  HCT 33.4* 30.7* 30.2*  MCV 84.1 85.3 85.3  PLT 342 279 035   Basic Metabolic Panel: Recent Labs  Lab 07/30/19 2315 07/31/19 0518 08/01/19 0210  NA 136 139 141  K 3.9 3.6 3.2*  CL 104 107 111  CO2 24 23 22   GLUCOSE 123* 133* 104*  BUN 13 14 10   CREATININE 1.01* 0.96 0.67  CALCIUM 8.2* 7.9* 7.9*  MG  --   --  2.1   GFR: Estimated Creatinine Clearance: 118.4 mL/min (by C-G formula based on SCr of 0.67 mg/dL). Liver  Function Tests: Recent Labs  Lab 07/30/19 2315  AST 23  ALT 18  ALKPHOS 84  BILITOT 0.8  PROT 6.9  ALBUMIN 3.5   No results for input(s): LIPASE, AMYLASE in the last 168 hours. No results for input(s): AMMONIA in the last 168 hours. Coagulation Profile: Recent Labs  Lab 07/30/19 2315  INR 1.1   Cardiac Enzymes: No results for input(s): CKTOTAL, CKMB, CKMBINDEX, TROPONINI in the last 168 hours. BNP (last 3 results) No results for input(s): PROBNP in the last 8760 hours. HbA1C: Recent Labs    08/01/19 0210  HGBA1C 5.3    CBG: No results for input(s): GLUCAP in the last 168 hours. Lipid Profile: Recent Labs    08/01/19 0210  CHOL 160  HDL 47  LDLCALC 98  TRIG 77  CHOLHDL 3.4   Thyroid Function Tests: Recent Labs    08/01/19 0906  TSH 0.432   Anemia Panel: No results for input(s): VITAMINB12, FOLATE, FERRITIN, TIBC, IRON, RETICCTPCT in the last 72 hours. Sepsis Labs: Recent Labs  Lab 07/30/19 2315  LATICACIDVEN 1.4    Recent Results (from the past 240 hour(s))  Blood Culture (routine x 2)     Status: None (Preliminary result)   Collection Time: 07/30/19 11:15 PM   Specimen: BLOOD  Result Value Ref Range Status   Specimen Description   Final    BLOOD LEFT ANTECUBITAL Performed at [place] [address], [place], [place] [address]    Special Requests   Final    BOTTLES DRAWN AEROBIC AND ANAEROBIC Blood Culture adequate volume Performed at [place] [address] [place], [place] [address]    Culture   Final    NO GROWTH 1 DAY Performed at [facility], [place] [address], [place], [place] [address]    Report Status PENDING  Incomplete  Blood Culture (routine x 2)     Status: None (Preliminary result)   Collection Time: 07/30/19 11:15 PM   Specimen: BLOOD  Result Value Ref Range Status   Specimen Description   Final    BLOOD BLOOD LEFT FOREARM Performed at [place] [place] [address], [place], [place] [address]    Special Requests   Final    Blood Culture results may not be optimal due to an inadequate volume of blood received in culture bottles BOTTLES DRAWN AEROBIC AND ANAEROBIC Performed at [facility], [place] [address], [place], [place] [address]    Culture   Final    NO GROWTH 1 DAY Performed at [facility], [place] [address], [place], [place] [address]    Report Status PENDING  Incomplete  SARS Coronavirus 2 by RT PCR (hospital order, performed in [facility] hospital lab)  Nasopharyngeal Nasopharyngeal Swab     Status: None   Collection Time: 07/30/19 11:38 PM   Specimen: Nasopharyngeal Swab  Result Value Ref Range Status   SARS Coronavirus 2 NEGATIVE NEGATIVE Final    Comment: (NOTE) If result is NEGATIVE SARS-CoV-2 target nucleic acids are NOT DETECTED. The SARS-CoV-2 RNA is generally detectable in upper and lower  respiratory specimens during the acute phase of infection. The lowest  concentration of SARS-CoV-2 viral copies this assay can detect is 250  copies / mL. A negative result does not preclude SARS-CoV-2 infection  and should not be used as the sole basis for treatment or other  patient management decisions.  A negative result may occur with  improper specimen  collection / handling, submission of specimen other  than nasopharyngeal swab, presence of viral mutation(s) within the  areas targeted by this assay, and inadequate number of viral copies  (<250 copies / mL). A negative result must be combined with clinical  observations, patient history, and epidemiological information. If result is POSITIVE SARS-CoV-2 target nucleic acids are DETECTED. The SARS-CoV-2 RNA is generally detectable in upper and lower  respiratory specimens dur ing the acute phase of infection.  Positive  results are indicative of active infection with SARS-CoV-2.  Clinical  correlation with patient history and other diagnostic information is  necessary to determine patient infection status.  Positive results do  not rule out bacterial infection or co-infection with other viruses. If result is PRESUMPTIVE POSTIVE SARS-CoV-2 nucleic acids MAY BE PRESENT.   A presumptive positive result was obtained on the submitted specimen  and confirmed on repeat testing.  While 2019 novel coronavirus  (SARS-CoV-2) nucleic acids may be present in the submitted sample  additional confirmatory testing may be necessary for epidemiological  and / or clinical management purposes  to  differentiate between  SARS-CoV-2 and other Sarbecovirus currently known to infect humans.  If clinically indicated additional testing with an alternate test  methodology [phone]) is advised. The SARS-CoV-2 RNA is generally  detectable in upper and lower respiratory sp ecimens during the acute  phase of infection. The expected result is Negative. Fact Sheet for Patients:  StrictlyIdeas.no Fact Sheet for Healthcare Providers: BankingDealers.co.za This test is not yet approved or cleared by the [place] FDA and has been authorized for detection and/or diagnosis of SARS-CoV-2 by FDA under an Emergency Use Authorization (EUA).  This EUA will remain in effect (meaning this test can be used) for the duration of the COVID-19 declaration under Section 564(b)(1) of the Act, 21 U.S.C. section 360bbb-3(b)(1), unless the authorization is terminated or revoked sooner. Performed at [facility], [place] [address] [place], [place] [address]   Respiratory Panel by PCR     Status: None   Collection Time: 07/31/19  4:19 AM   Specimen: Flu Kit Nasopharyngeal Swab; Respiratory  Result Value Ref Range Status   Adenovirus NOT DETECTED NOT DETECTED Final   Coronavirus 229E NOT DETECTED NOT DETECTED Final    Comment: (NOTE) The Coronavirus on the Respiratory Panel, DOES NOT test for the novel  Coronavirus (2019 nCoV)    Coronavirus HKU1 NOT DETECTED NOT DETECTED Final   Coronavirus NL63 NOT DETECTED NOT DETECTED Final   Coronavirus OC43 NOT DETECTED NOT DETECTED Final   Metapneumovirus NOT DETECTED NOT DETECTED Final   Rhinovirus / Enterovirus NOT DETECTED NOT DETECTED Final   Influenza A NOT DETECTED NOT DETECTED Final   Influenza B NOT DETECTED NOT DETECTED Final   Parainfluenza Virus 1 NOT DETECTED NOT DETECTED Final   Parainfluenza Virus 2 NOT DETECTED NOT DETECTED Final   Parainfluenza Virus 3 NOT DETECTED NOT DETECTED Final    Parainfluenza Virus 4 NOT DETECTED NOT DETECTED Final   Respiratory Syncytial Virus NOT DETECTED NOT DETECTED Final   Bordetella pertussis NOT DETECTED NOT DETECTED Final   Chlamydophila pneumoniae NOT DETECTED NOT DETECTED Final   Mycoplasma pneumoniae NOT DETECTED NOT DETECTED Final    Comment: Performed at [facility], [place]. [address] [place], [place] [address]  MRSA PCR Screening     Status: None   Collection Time: 07/31/19  6:10 AM   Specimen: Nasal Mucosa; Nasopharyngeal  Result Value Ref Range Status   MRSA by  PCR NEGATIVE NEGATIVE Final    Comment:        The GeneXpert MRSA Assay (FDA approved for NASAL specimens only), is one component of a comprehensive MRSA colonization surveillance program. It is not intended to diagnose MRSA infection nor to guide or monitor treatment for MRSA infections. Performed at [facility], [place] [address], [place], [place] [address]   SARS CORONAVIRUS 2 (TAT 6-24 HRS) Nasopharyngeal Nasopharyngeal Swab     Status: None   Collection Time: 07/31/19  8:30 AM   Specimen: Nasopharyngeal Swab  Result Value Ref Range Status   SARS Coronavirus 2 NEGATIVE NEGATIVE Final    Comment: (NOTE) SARS-CoV-2 target nucleic acids are NOT DETECTED. The SARS-CoV-2 RNA is generally detectable in upper and lower respiratory specimens during the acute phase of infection. Negative results do not preclude SARS-CoV-2 infection, do not rule out co-infections with other pathogens, and should not be used as the sole basis for treatment or other patient management decisions. Negative results must be combined with clinical observations, patient history, and epidemiological information. The expected result is Negative. Fact Sheet for Patients: SugarRoll.be Fact Sheet for Healthcare Providers: https://www.woods-mathews.com/ This test is not yet approved or cleared by the [place] FDA and  has been  authorized for detection and/or diagnosis of SARS-CoV-2 by FDA under an Emergency Use Authorization (EUA). This EUA will remain  in effect (meaning this test can be used) for the duration of the COVID-19 declaration under Section 56 4(b)(1) of the Act, 21 U.S.C. section 360bbb-3(b)(1), unless the authorization is terminated or revoked sooner. Performed at [facility], [place] [address], [place],  [address]          Radiology Studies: Dg Lumbar Spine Complete  Result Date: 07/30/2019 CLINICAL DATA:  Fall EXAM: LUMBAR SPINE - COMPLETE 4+ VIEW COMPARISON:  None. FINDINGS: There is no evidence of lumbar spine fracture. Alignment is normal. Disc space narrowing is greatest at L4-5. IMPRESSION: No fracture or listhesis of the lumbar spine. Degenerative disc disease greatest at L4-5. Electronically Signed   By: Ulyses Jarred M.D.   On: 07/30/2019 22:54   Dg Wrist Complete Right  Result Date: 07/30/2019 CLINICAL DATA:  Fall EXAM: RIGHT WRIST - COMPLETE 3+ VIEW COMPARISON:  None. FINDINGS: There is no evidence of fracture or dislocation. There is no evidence of arthropathy or other focal bone abnormality. Soft tissues are unremarkable. IMPRESSION: Negative. Electronically Signed   By: Ulyses Jarred M.D.   On: 07/30/2019 22:53   Ct Head Wo Contrast  Result Date: 07/30/2019 CLINICAL DATA:  Fall EXAM: CT HEAD WITHOUT CONTRAST CT CERVICAL SPINE WITHOUT CONTRAST TECHNIQUE: Multidetector CT imaging of the head and cervical spine was performed following the standard protocol without intravenous contrast. Multiplanar CT image reconstructions of the cervical spine were also generated. COMPARISON:  04/14/2019 head CT FINDINGS: CT HEAD FINDINGS Brain: There is no mass, hemorrhage or extra-axial collection. The size and configuration of the ventricles and extra-axial CSF spaces are normal. The brain parenchyma is normal, without evidence of acute or chronic infarction. Vascular: No abnormal  hyperdensity of the major intracranial arteries or dural venous sinuses. No intracranial atherosclerosis. Skull: The visualized skull base, calvarium and extracranial soft tissues are normal. Sinuses/Orbits: No fluid levels or advanced mucosal thickening of the visualized paranasal sinuses. No mastoid or middle ear effusion. The orbits are normal. CT CERVICAL SPINE FINDINGS Alignment: No static subluxation. Facets are aligned. Occipital condyles are normally positioned. Skull base and vertebrae: No acute  fracture. Soft tissues and spinal canal: No prevertebral fluid or swelling. No visible canal hematoma. Disc levels: No advanced spinal canal or neural foraminal stenosis. Upper chest: No pneumothorax, pulmonary nodule or pleural effusion. Other: Normal visualized paraspinal cervical soft tissues. IMPRESSION: 1. No acute intracranial abnormality. 2. No acute fracture or static subluxation of the cervical spine. Electronically Signed   By: Ulyses Jarred M.D.   On: 07/30/2019 23:00   Ct Cervical Spine Wo Contrast  Result Date: 07/30/2019 CLINICAL DATA:  Fall EXAM: CT HEAD WITHOUT CONTRAST CT CERVICAL SPINE WITHOUT CONTRAST TECHNIQUE: Multidetector CT imaging of the head and cervical spine was performed following the standard protocol without intravenous contrast. Multiplanar CT image reconstructions of the cervical spine were also generated. COMPARISON:  04/14/2019 head CT FINDINGS: CT HEAD FINDINGS Brain: There is no mass, hemorrhage or extra-axial collection. The size and configuration of the ventricles and extra-axial CSF spaces are normal. The brain parenchyma is normal, without evidence of acute or chronic infarction. Vascular: No abnormal hyperdensity of the major intracranial arteries or dural venous sinuses. No intracranial atherosclerosis. Skull: The visualized skull base, calvarium and extracranial soft tissues are normal. Sinuses/Orbits: No fluid levels or advanced mucosal thickening of the visualized  paranasal sinuses. No mastoid or middle ear effusion. The orbits are normal. CT CERVICAL SPINE FINDINGS Alignment: No static subluxation. Facets are aligned. Occipital condyles are normally positioned. Skull base and vertebrae: No acute fracture. Soft tissues and spinal canal: No prevertebral fluid or swelling. No visible canal hematoma. Disc levels: No advanced spinal canal or neural foraminal stenosis. Upper chest: No pneumothorax, pulmonary nodule or pleural effusion. Other: Normal visualized paraspinal cervical soft tissues. IMPRESSION: 1. No acute intracranial abnormality. 2. No acute fracture or static subluxation of the cervical spine. Electronically Signed   By: Ulyses Jarred M.D.   On: 07/30/2019 23:00   Dg Chest Port 1 View  Result Date: 07/30/2019 CLINICAL DATA:  Cough and fever EXAM: PORTABLE CHEST 1 VIEW COMPARISON:  04/14/2019 FINDINGS: The heart size and mediastinal contours are within normal limits. Both lungs are clear. The visualized skeletal structures are unremarkable. IMPRESSION: No active disease. Electronically Signed   By: Ulyses Jarred M.D.   On: 07/30/2019 22:55        Scheduled Meds:  apixaban  5 mg Oral BID   azithromycin  500 mg Oral QHS   buPROPion  300 mg Oral Daily   Chlorhexidine Gluconate Cloth  6 each Topical Q0600   diltiazem  300 mg Oral Daily   FLUoxetine  20 mg Oral QHS   gabapentin  1,200 mg Oral TID   mouth rinse  15 mL Mouth Rinse BID   OLANZapine  10 mg Oral QHS   Continuous Infusions:  sodium chloride 1,000 mL (08/01/19 1227)   cefTRIAXone (ROCEPHIN)  IV Stopped (07/31/19 2234)     LOS: 1 day    Time spent: 36 minutes spent on chart review, discussion with nursing staff, consultants, updating family and interview/physical exam; more than 50% of that time was spent in counseling and/or coordination of care.    Shali Vesey J [place] DO [facility] 08/01/2019, 12:29 PM

## 2019-08-01 NOTE — Telephone Encounter (Signed)
Patient currently admitted

## 2019-08-01 NOTE — Progress Notes (Signed)
PHARMACIST - PHYSICIAN COMMUNICATION DR:   [place] CONCERNING: Antibiotic IV to Oral Route Change Policy  RECOMMENDATION: This patient is receiving azithromycin by the intravenous route.  Based on criteria approved by the Pharmacy and Therapeutics Committee, the antibiotic(s) is/are being converted to the equivalent oral dose form(s).   DESCRIPTION: These criteria include:  Patient being treated for a respiratory tract infection, urinary tract infection, cellulitis or clostridium difficile associated diarrhea if on metronidazole  The patient is not neutropenic and does not exhibit a GI malabsorption state  The patient is eating (either orally or via tube) and/or has been taking other orally administered medications for a least 24 hours  The patient is improving clinically and has a Tmax < 100.5  If you have questions about this conversion, please contact the Pharmacy Department  []   [phone] )  [facility] Na []   [phone] )  [facility] []   [phone] )  [facility] []   [phone] )  [facility] [x]   [phone] )  [place], PharmD, BCPS 08/01/2019 10:10 AM

## 2019-08-01 NOTE — Progress Notes (Signed)
ANTICOAGULATION CONSULT NOTE - Follow Up Consult  Pharmacy Consult for heparin --> lovenox--> Eliquis Indication: atrial fibrillation  Allergies  Allergen Reactions  . Nsaids Other (See Comments)    Post Gastric Bypass  . Adhesive [Tape] Itching and Rash  . Latex Itching and Rash  . Sulfa Antibiotics Itching and Rash    Patient Measurements: Height: 5\' 6"  (167.6 cm) Weight: (!) 330 lb (149.7 kg) IBW/kg (Calculated) : 59.3 Heparin Dosing Weight: 97 kg  Vital Signs: Temp: 97.9 F (36.6 C) (10/28 0800) Temp Source: Oral (10/28 0800) BP: 132/69 (10/28 0600) Pulse Rate: 81 (10/28 0800)  Labs: Recent Labs    07/30/19 2315 07/31/19 0314 07/31/19 0518 07/31/19 0909 07/31/19 1120 08/01/19 0210  HGB 9.6*  --  8.8*  --   --  8.7*  HCT 33.4*  --  30.7*  --   --  30.2*  PLT 342  --  279  --   --  256  APTT 31  --   --   --   --   --   LABPROT 14.1  --   --   --   --   --   INR 1.1  --   --   --   --   --   HEPARINUNFRC  --   --   --   --  0.17*  --   CREATININE 1.01*  --  0.96  --   --  0.67  TROPONINIHS  --  1,711* 1,102* 736*  --   --     Estimated Creatinine Clearance: 118.4 mL/min (by C-G formula based on SCr of 0.67 mg/dL).   Assessment: Patient's a 58 y.o F presented to the ED on 10/26 with c/o fall and fever.  She subsequently had CP with elevated troponin and PAF with RVR.  Cardiology team suspects CP may be demand ischemia. Patient was started on heparin drip, changed to [place] on 10/27, and transitioned to Eliquis on 10/28.   Today, 08/01/2019: - hgb down slightly to 8.7, plts 256 - no bleeding documented - weight 150 kg, scr <1.5   Plan:  - d/c lovenox - start Eliquis 5 mg BID - monitor for s/s bleeding - pharmacy will sign off, but will follow pt peripherally along with you  Pham, Anh P 08/01/2019,10:01 AM

## 2019-08-01 NOTE — Progress Notes (Addendum)
Progress Note  Patient Name: Tammy [name] Date of Encounter: 08/01/2019  Primary Cardiologist: Minus Breeding, MD   Subjective   Patient reports she is feeling better. Asks when she can go home. No complaints of chest pain, SOB, or palpitations. She denies prior sleep study but reports husband notes snoring and apneic episodes.   Inpatient Medications    Scheduled Meds:  buPROPion  300 mg Oral Daily   Chlorhexidine Gluconate Cloth  6 each Topical Q0600   enoxaparin (LOVENOX) injection  150 mg Subcutaneous Q12H   FLUoxetine  20 mg Oral QHS   gabapentin  1,200 mg Oral TID   influenza vac split quadrivalent PF  0.5 mL Intramuscular Tomorrow-1000   mouth rinse  15 mL Mouth Rinse BID   OLANZapine  10 mg Oral QHS   Continuous Infusions:  sodium chloride 125 mL/hr at 08/01/19 0513   azithromycin Stopped (07/31/19 2344)   cefTRIAXone (ROCEPHIN)  IV Stopped (07/31/19 2234)   diltiazem (CARDIZEM) infusion 15 mg/hr (08/01/19 0513)   PRN Meds: acetaminophen, clonazePAM, morphine injection   Vital Signs    Vitals:   08/01/19 0200 08/01/19 0347 08/01/19 0500 08/01/19 0600  BP: 117/62  (!) 150/74 132/69  Pulse: 77  86 (!) 30  Resp: 18  (!) 28 (!) 28  Temp:  97.7 F (36.5 C)    TempSrc:  Axillary    SpO2: 98%  97% 97%  Weight:      Height:        Intake/Output Summary (Last 24 hours) at 08/01/2019 0805 Last data filed at 08/01/2019 0513 Gross per 24 hour  Intake 3160.8 ml  Output 2000 ml  Net 1160.8 ml   Filed Weights   07/30/19 2343  Weight: (!) 149.7 kg    Telemetry    Atrial fibrillation with rates primarily in the 80s-90s - Personally Reviewed  ECG    No new tracings - Personally Reviewed  Physical Exam   GEN: Sitting upright in bed in no acute distress.   Neck: difficult to assess JVD given body habitus, no carotid bruits Cardiac: IRIR, no murmurs, rubs, or gallops.  Respiratory: Clear to auscultation bilaterally, no wheezes/ rales/  rhonchi GI: NABS, Soft, obese, nontender, non-distended  MS: No edema; No deformity. Neuro:  Nonfocal, moving all extremities spontaneously Psych: Normal affect   Labs    Chemistry Recent Labs  Lab 07/30/19 2315 07/31/19 0518 08/01/19 0210  NA 136 139 141  K 3.9 3.6 3.2*  CL 104 107 111  CO2 24 23 22   GLUCOSE 123* 133* 104*  BUN 13 14 10   CREATININE 1.01* 0.96 0.67  CALCIUM 8.2* 7.9* 7.9*  PROT 6.9  --   --   ALBUMIN 3.5  --   --   AST 23  --   --   ALT 18  --   --   ALKPHOS 84  --   --   BILITOT 0.8  --   --   GFRNONAA >60 >60 >60  GFRAA >60 >60 >60  ANIONGAP 8 9 8      Hematology Recent Labs  Lab 07/30/19 2315 07/31/19 0518 08/01/19 0210  WBC 11.1* 9.8 8.1  RBC 3.97 3.60* 3.54*  HGB 9.6* 8.8* 8.7*  HCT 33.4* 30.7* 30.2*  MCV 84.1 85.3 85.3  MCH 24.2* 24.4* 24.6*  MCHC 28.7* 28.7* 28.8*  RDW 17.9* 17.9* 17.9*  PLT 342 279 256    Cardiac EnzymesNo results for input(s): TROPONINI in the last 168 hours. No results  for input(s): TROPIPOC in the last 168 hours.   BNPNo results for input(s): BNP, PROBNP in the last 168 hours.   DDimer No results for input(s): DDIMER in the last 168 hours.   Radiology    Dg Lumbar Spine Complete  Result Date: 07/30/2019 CLINICAL DATA:  Fall EXAM: LUMBAR SPINE - COMPLETE 4+ VIEW COMPARISON:  None. FINDINGS: There is no evidence of lumbar spine fracture. Alignment is normal. Disc space narrowing is greatest at L4-5. IMPRESSION: No fracture or listhesis of the lumbar spine. Degenerative disc disease greatest at L4-5. Electronically Signed   By: Ulyses Jarred M.D.   On: 07/30/2019 22:54   Dg Wrist Complete Right  Result Date: 07/30/2019 CLINICAL DATA:  Fall EXAM: RIGHT WRIST - COMPLETE 3+ VIEW COMPARISON:  None. FINDINGS: There is no evidence of fracture or dislocation. There is no evidence of arthropathy or other focal bone abnormality. Soft tissues are unremarkable. IMPRESSION: Negative. Electronically Signed   By: Ulyses Jarred M.D.   On: 07/30/2019 22:53   Ct Head Wo Contrast  Result Date: 07/30/2019 CLINICAL DATA:  Fall EXAM: CT HEAD WITHOUT CONTRAST CT CERVICAL SPINE WITHOUT CONTRAST TECHNIQUE: Multidetector CT imaging of the head and cervical spine was performed following the standard protocol without intravenous contrast. Multiplanar CT image reconstructions of the cervical spine were also generated. COMPARISON:  04/14/2019 head CT FINDINGS: CT HEAD FINDINGS Brain: There is no mass, hemorrhage or extra-axial collection. The size and configuration of the ventricles and extra-axial CSF spaces are normal. The brain parenchyma is normal, without evidence of acute or chronic infarction. Vascular: No abnormal hyperdensity of the major intracranial arteries or dural venous sinuses. No intracranial atherosclerosis. Skull: The visualized skull base, calvarium and extracranial soft tissues are normal. Sinuses/Orbits: No fluid levels or advanced mucosal thickening of the visualized paranasal sinuses. No mastoid or middle ear effusion. The orbits are normal. CT CERVICAL SPINE FINDINGS Alignment: No static subluxation. Facets are aligned. Occipital condyles are normally positioned. Skull base and vertebrae: No acute fracture. Soft tissues and spinal canal: No prevertebral fluid or swelling. No visible canal hematoma. Disc levels: No advanced spinal canal or neural foraminal stenosis. Upper chest: No pneumothorax, pulmonary nodule or pleural effusion. Other: Normal visualized paraspinal cervical soft tissues. IMPRESSION: 1. No acute intracranial abnormality. 2. No acute fracture or static subluxation of the cervical spine. Electronically Signed   By: Ulyses Jarred M.D.   On: 07/30/2019 23:00   Ct Cervical Spine Wo Contrast  Result Date: 07/30/2019 CLINICAL DATA:  Fall EXAM: CT HEAD WITHOUT CONTRAST CT CERVICAL SPINE WITHOUT CONTRAST TECHNIQUE: Multidetector CT imaging of the head and cervical spine was performed following the  standard protocol without intravenous contrast. Multiplanar CT image reconstructions of the cervical spine were also generated. COMPARISON:  04/14/2019 head CT FINDINGS: CT HEAD FINDINGS Brain: There is no mass, hemorrhage or extra-axial collection. The size and configuration of the ventricles and extra-axial CSF spaces are normal. The brain parenchyma is normal, without evidence of acute or chronic infarction. Vascular: No abnormal hyperdensity of the major intracranial arteries or dural venous sinuses. No intracranial atherosclerosis. Skull: The visualized skull base, calvarium and extracranial soft tissues are normal. Sinuses/Orbits: No fluid levels or advanced mucosal thickening of the visualized paranasal sinuses. No mastoid or middle ear effusion. The orbits are normal. CT CERVICAL SPINE FINDINGS Alignment: No static subluxation. Facets are aligned. Occipital condyles are normally positioned. Skull base and vertebrae: No acute fracture. Soft tissues and spinal canal: No prevertebral fluid or swelling.  No visible canal hematoma. Disc levels: No advanced spinal canal or neural foraminal stenosis. Upper chest: No pneumothorax, pulmonary nodule or pleural effusion. Other: Normal visualized paraspinal cervical soft tissues. IMPRESSION: 1. No acute intracranial abnormality. 2. No acute fracture or static subluxation of the cervical spine. Electronically Signed   By: Ulyses Jarred M.D.   On: 07/30/2019 23:00   Dg Chest Port 1 View  Result Date: 07/30/2019 CLINICAL DATA:  Cough and fever EXAM: PORTABLE CHEST 1 VIEW COMPARISON:  04/14/2019 FINDINGS: The heart size and mediastinal contours are within normal limits. Both lungs are clear. The visualized skeletal structures are unremarkable. IMPRESSION: No active disease. Electronically Signed   By: Ulyses Jarred M.D.   On: 07/30/2019 22:55    Cardiac Studies   Echocardiogram 07/31/2019: 1. Left ventricular ejection fraction, by visual estimation, is 60 to 65%.  The left ventricle has normal function. Left ventricular septal wall thickness was mildly increased. Normal left ventricular posterior wall thickness. There is mildly increased  left ventricular hypertrophy.  2. Global right ventricle has normal systolic function.The right ventricular size is mildly enlarged. No increase in right ventricular wall thickness.  3. Left atrial size was moderately dilated.  4. Right atrial size was moderately dilated.  5. The mitral valve is normal in structure. No evidence of mitral valve regurgitation.  6. The tricuspid valve is normal in structure. Tricuspid valve regurgitation is trivial.  7. The aortic valve is normal in structure. Aortic valve regurgitation was not visualized by color flow Doppler.  8. The pulmonic valve was normal in structure. Pulmonic valve regurgitation is not visualized by color flow Doppler.  9. Mildly elevated pulmonary artery systolic pressure. 10. The inferior vena cava is dilated in size with <50% respiratory variability, suggesting right atrial pressure of 15 mmHg  Patient Profile     58 y.o. female with PMH of morbid obesity, depression, anxiety, and urinary stress incontinence, who presented with a fall, found to be septic with PNA. Cardiology following for atrial fibrillation and chest pain.  Assessment & Plan    1. New onset atrial fibrillation with RVR: patient presented after a fall, found to have PNA and sepsis. Subsequently developed chest pain and was found to be in Afib RVR 07/31/2019. Echo 07/31/2019 showed EF 60-65%, normal LV diastolic function, mild LVH, moderate biatrial enlargement, and no significant valvular abnormalities. She was started on a heparin gtt and diltiazem gtt. Afib likely driving by PNA/sepsis.  - This patients CHA2DS2-VASc Score and unadjusted Ischemic Stroke Rate (% per year) is equal to at least 0.6 % stroke rate/year from a score of 1 (female +/- HTN) - Will add on HgbA1C to AM labs for risk  stratification - At this point favor transition to po diltiazem 300mg  daily and apixaban 5mg  BID with consideration for outpatient DCCV in 4 weeks if she remains in atrial fibrillation. Importance of medication compliance stressed if plans for DCCV pursued.    2. Elevated troponin in patient with chest pain: chest pain occurred in the setting of Afib with RVR. Trop trend [phone]. EKG with afib with RVR with non-specific T wave abnormalities. Echo reassuring with EF 60-65%, normal LV diastolic function, no RWMA. LDL 98. - Will add on HgbA1C to labs this morning for risk stratification - Consider 2-day lexiscan when recovered from present illness  3. PNA/Sepsis: Patient febrile and hypoxic on arrival. WBC elevated. CXR with c/f RML/RLL PNA. Respiratory panel negative and COVID-19 negative x2. She is on IV antibiotics - Continue  antibiotics and supportive care per primary team  4. Elevated blood pressure: BP somewhat labile over the past 24 hours.; significantly elevated at time (max 166/122).  - Continue to monitor  - Recommend patient keep a blood pressure log (AM/PM) at home to determine if antihypertensive medications needed. She should bring these readings to her outpatient follow-up visit for further review.   5. Morbid obesity: BMI 53.  - Continue to encourage aggressive lifestyle/dietary modifications to promote weight loss  6. Suspected OSA: given morbid obesity status and new onset Afib - Consider outpatient sleep study after resolution of present illness  Will arrange close outpatient follow-up.   For questions or updates, please contact [place] Please consult [email or web address] for contact info under Cardiology/STEMI.      Signed, Abigail Butts, PA-C  08/01/2019, 8:05 AM   [phone]  History and all data above reviewed.  Patient examined.  I agree with the findings as above.   She does not feel the fib.  The patient denies any new symptoms such as chest  discomfort, neck or arm discomfort. There has been no new shortness of breath, PND or orthopnea. There have been no reported palpitations, presyncope or syncope. She wants to go home. The patient exam reveals [id number]  ,  Lungs: Clear  ,  Abd: Positive bowel sounds, no rebound no guarding, Ext No edema  .  All available labs, radiology testing, previous records reviewed. Agree with documented assessment and plan.  Atrial fib:  Rate control.  No absolute indication for DOAC except the fact that we might need to do DCCV in the future.  So, I would continue it for now.  I discussed with her [facility] to monitor her rhythm at home and she will look into this.   Likely an out patient stress test in the future.   Jeneen Rinks Dyanara Cozza  12:27 PM  08/01/2019

## 2019-08-01 NOTE — Telephone Encounter (Signed)
New message   Per Tammy [name] scheduled TOC appt with Tammy [name] on 08/09/2019 at 11:15 am.

## 2019-08-01 NOTE — Evaluation (Signed)
Physical Therapy Evaluation Patient Details Name: Tammy [name] MRN: [id number] DOB: 12-10-1960 Today's Date: 08/01/2019   History of Present Illness  58 y.o. female with medical history significant of obesity, depression/anxiety, urinary incontinence who presented to the ED for evaluation after a fall at home. She reports progressive fatigue, slipped out of her bed and landed on her bottom.  She had pain in her neck and back as well as her right wrist.  CT and xrays negative. dx with pna, covid negative x2  Clinical Impression  Pt admitted with above diagnosis.  Pt amb ~ 240' with no device and min./guard for safety, HR and RR elevated, see below for details. No f/u recommended at this time, will follow in acute setting   Pt currently with functional limitations due to the deficits listed below (see PT Problem List). Pt will benefit from skilled PT to increase their independence and safety with mobility to allow discharge to the venue listed below.       Follow Up Recommendations No PT follow up    Equipment Recommendations  None recommended by PT    Recommendations for Other Services       Precautions / Restrictions Precautions Precautions: Fall Precaution Comments: monitor HR      Mobility  Bed Mobility Overal bed mobility: Needs Assistance Bed Mobility: Supine to Sit     Supine to sit: Supervision     General bed mobility comments: only for lines/safety--no physical assist  Transfers Overall transfer level: Needs assistance   Transfers: Sit to/from Stand Sit to Stand: Min guard         General transfer comment: for safety  Ambulation/Gait Ambulation/Gait assistance: Min guard Gait Distance (Feet): 240 Feet Assistive device: None Gait Pattern/deviations: Step-through pattern;Wide base of support;Trendelenburg     General Gait Details: pt with slow but overall steady gait, mild trendelenberg and wide BOS d/t body habitus, HR in 140s after ~ 200', decr to  120s with standing rest; RR up to 40, 30s at rest, SpO2=96-100% on RA  Stairs            Wheelchair Mobility    Modified Rankin (Stroke Patients Only)       Balance Overall balance assessment: Needs assistance   Sitting balance-Leahy Scale: Good       Standing balance-Leahy Scale: Fair Standing balance comment: able to wt shift, unable to tolerate challenges                             Pertinent Vitals/Pain Pain Assessment: No/denies pain    Home Living Family/patient expects to be discharged to:: Private residence Living Arrangements: Spouse/significant other   Type of Home: House       Home Layout: Two level Home Equipment: None      Prior Function Level of Independence: Independent               Hand Dominance        Extremity/Trunk Assessment   Upper Extremity Assessment Upper Extremity Assessment: Overall WFL for tasks assessed    Lower Extremity Assessment Lower Extremity Assessment: Overall WFL for tasks assessed       Communication   Communication: No difficulties  Cognition Arousal/Alertness: Awake/alert Behavior During Therapy: WFL for tasks assessed/performed Overall Cognitive Status: Within Functional Limits for tasks assessed  General Comments      Exercises     Assessment/Plan    PT Assessment Patient needs continued PT services  PT Problem List Decreased activity tolerance;Decreased balance;Cardiopulmonary status limiting activity       PT Treatment Interventions DME instruction;Therapeutic exercise;Gait training;Functional mobility training;Therapeutic activities;Patient/family education;Balance training    PT Goals (Current goals can be found in the Care Plan section)  Acute Rehab PT Goals Patient Stated Goal: home soon PT Goal Formulation: With patient Time For Goal Achievement: 08/14/19 Potential to Achieve Goals: Good    Frequency Min  3X/week   Barriers to discharge        Co-evaluation               AM-PAC PT "6 Clicks" Mobility  Outcome Measure Help needed turning from your back to your side while in a flat bed without using bedrails?: None Help needed moving from lying on your back to sitting on the side of a flat bed without using bedrails?: None Help needed moving to and from a bed to a chair (including a wheelchair)?: A Little Help needed standing up from a chair using your arms (e.g., wheelchair or bedside chair)?: A Little Help needed to walk in hospital room?: A Little Help needed climbing 3-5 steps with a railing? : A Little 6 Click Score: 20    End of Session Equipment Utilized During Treatment: Gait belt Activity Tolerance: Patient limited by fatigue Patient left: in chair;with call bell/phone within reach;with chair alarm set Nurse Communication: Mobility status PT Visit Diagnosis: Difficulty in walking, not elsewhere classified (R26.2);History of falling (Z91.81)    Time: [id number] PT Time Calculation (min) (ACUTE ONLY): 18 min   Charges:   PT Evaluation $PT Eval Low Complexity: 1 Low          Kenyon Ana, PT  Pager: [phone] Acute Rehab Dept [facility]): [phone]   08/01/2019   [facility] 08/01/2019, 2:21 PM

## 2019-08-02 LAB — CBC
HCT: 30.4 % — ABNORMAL LOW (ref 36.0–46.0)
Hemoglobin: 8.7 g/dL — ABNORMAL LOW (ref 12.0–15.0)
MCH: 24.6 pg — ABNORMAL LOW (ref 26.0–34.0)
MCHC: 28.6 g/dL — ABNORMAL LOW (ref 30.0–36.0)
MCV: 85.9 fL (ref 80.0–100.0)
Platelets: 264 10*3/uL (ref 150–400)
RBC: 3.54 MIL/uL — ABNORMAL LOW (ref 3.87–5.11)
RDW: 18.5 % — ABNORMAL HIGH (ref 11.5–15.5)
WBC: 5.8 10*3/uL (ref 4.0–10.5)
nRBC: 0 % (ref 0.0–0.2)

## 2019-08-02 LAB — BASIC METABOLIC PANEL
Anion gap: 6 (ref 5–15)
BUN: 6 mg/dL (ref 6–20)
CO2: 21 mmol/L — ABNORMAL LOW (ref 22–32)
Calcium: 8.1 mg/dL — ABNORMAL LOW (ref 8.9–10.3)
Chloride: 113 mmol/L — ABNORMAL HIGH (ref 98–111)
Creatinine, Ser: 0.73 mg/dL (ref 0.44–1.00)
GFR calc Af Amer: >60 mL/min (ref >60)
GFR calc non Af Amer: >60 mL/min (ref >60)
Glucose, Bld: 90 mg/dL (ref 70–99)
Potassium: 2.9 mmol/L — ABNORMAL LOW (ref 3.5–5.1)
Sodium: 140 mmol/L (ref 135–145)

## 2019-08-02 LAB — URINE CULTURE: Culture: <10000 — AB

## 2019-08-02 LAB — MAGNESIUM: Magnesium: 2.1 mg/dL (ref 1.7–2.4)

## 2019-08-02 MED ORDER — DILTIAZEM HCL ER COATED BEADS 300 MG PO CP24: 300.0000 mg | ORAL_CAPSULE | Freq: Every day | ORAL | 0 refills | Status: DC

## 2019-08-02 MED ORDER — APIXABAN 5 MG PO TABS: 5.0000 mg | ORAL_TABLET | Freq: Two times a day (BID) | ORAL | 0 refills | Status: DC

## 2019-08-02 MED ORDER — POTASSIUM CHLORIDE CRYS ER 20 MEQ PO TBCR
30.0000 meq | EXTENDED_RELEASE_TABLET | ORAL | Status: AC
  Administered 2019-08-02 (×3): 30 meq via ORAL
  Filled 2019-08-02 (×3): qty 1

## 2019-08-02 MED ORDER — AZITHROMYCIN 500 MG PO TABS: 500.0000 mg | ORAL_TABLET | Freq: Every day | ORAL | 0 refills | Status: DC

## 2019-08-02 MED ORDER — CEFDINIR 300 MG PO CAPS: 300.0000 mg | ORAL_CAPSULE | Freq: Two times a day (BID) | ORAL | 0 refills | Status: DC

## 2019-08-02 MED ORDER — AZITHROMYCIN 500 MG PO TABS: 500.0000 mg | ORAL_TABLET | Freq: Every day | ORAL | 0 refills | Status: AC

## 2019-08-02 MED ORDER — CEFDINIR 300 MG PO CAPS: 300.0000 mg | ORAL_CAPSULE | Freq: Two times a day (BID) | ORAL | 0 refills | Status: AC

## 2019-08-02 MED FILL — CEFDINIR 300 MG CAPSULE: 300 | 5 days supply | Qty: 10 | Fill #0

## 2019-08-02 MED FILL — AZITHROMYCIN 500 MG TABLET: 500 | 3 days supply | Qty: 3 | Fill #0

## 2019-08-02 MED FILL — !ELIQUIS 5MG TABLET: 5 | 30 days supply | Qty: 60 | Fill #0

## 2019-08-02 NOTE — Discharge Summary (Signed)
Physician Discharge Summary  Tammy PODOLAK XYO:[id number] DOB: Mar 27, 1961 DOA: 07/30/2019  PCP: Fanny Bien, MD  Admit date: 07/30/2019 Discharge date: 08/02/2019  Admitted From: Home Disposition:  Home  Recommendations for Outpatient Follow-up:  1. Follow up with PCP in 1-2 weeks 2. Follow-up with cardiology as scheduled on 08/09/2019 3. Newly started on the diltiazem 300 mg p.o. daily and Eliquis for A. Fib 4. Likely would benefit from outpatient sleep study to assess for underlying obstructive sleep apnea  Home Health: No Equipment/Devices: None  Discharge Condition: Stable CODE STATUS: Full code Diet recommendation: Heart Healthy   History of present illness: Tammy K Plummeris a 58 y.o.femalewith medical history significant of obesity, depression/anxiety, urinary incontinence who presented to the ED for evaluation after a fall at home. She reports progressive fatigue since yesterday but she was up and walking around. Patient slipped out of her bed and landed on her bottom. She had pain in her neck and back as well as her right wrist. EMS was called because of her fall.  EMS noted that she had low O2 sats (low 80s) on room air and put her on O2. She does not normally need O2 at home at baseline.  Patient denies fevers (subjectively) but has had mild cough at home.No vomiting, diarrhea, abd pain. Maybe slight dysuria.  ED Course:Tmax 101.6, new O2 requirement as noted above. Pt with A.Fib RVR up to 170s, now improved to 120s-140s on cardizem gtt. WBC 11.1k. CXR negative. Patient started on ceftriaxone and azithromycin empirically for presumed community acquired pneumonia.     Hospital course:  Sepsis, present on admission Acute hypoxic respiratory failure Community acquired pneumonia Patient was noted to be hypoxic on EMS arrival with oxygen saturations in the low 80s.  Not on home oxygen at baseline.  Patient was febrile up to 101.6 on arrival with elevated  white count of 11.1.  Lactic acid 1.4.  Covid-19/SARS-CoV-2 negative x 2.  Respiratory viral panel negative.  Rapid influenza negative.  Chest x-ray report shows no active disease by review of images with some mild consolidation appreciated right middle/lower lobe.  Patient was started on antibiotics with azithromycin and ceftriaxone.  WBC count improved to 5.8 at time of discharge.  We will continue antibiotics with azithromycin to complete a 5-day course and cefdinir to complete a 7-day course.  Patient's oxygen was titrated off at time of discharge.  New onset paroxysmal atrial fibrillation with RVR Etiology likely provoked from underlying sepsis as above.  No previous history afib.  Heart rate elevated to 176 on initial presentation.  EKG notable for atrial fibrillation.  CHA2DS2VASc score 2 (1 for age and likely 1 for undiagnosed HTN). Echocardiogram: EF 60-65%, IVC dilated, RA/LA moderate dilation.  TSH 0.432, within normal limits.  Cardiologist consulted and followed during hospital course.  Her Cardizem drip was eventually transitioned 300 mg p.o. daily with adequate rate control.  Patient was started on Eliquis 5 mg p.o. twice daily for anticoagulation.  Patient will has follow-up scheduled in the cardiology outpatient clinic on 08/09/2019.  Elevated troponin Troponin 1711-->1162.  Etiology likely from demand ischemia from atrial fibrillation with RVR as above. Currently chest pain-free.  Rate controlled with Cardizem.  Likely would benefit from outpatient ischemic work-up, per cardiology.  Morbid obesity BMI 53.26, discussed with patient extensively need for aggressive lifestyle changes and weight loss measures as this complicates all facets of care.  Fall Patient presenting with fall at home, denies syncopal episode.  CT head/C-spine negative for  acute intracranial pathology or fracture/subluxation.  X-ray lumbar spine with degenerative disc disease no acute fracture/subluxation, right  wrist with no acute fractures.  Etiology likely from accelerated heart rate from A. fib with RVR.  Seen by physical therapy without any further recommendations.   Discharge Diagnoses:  Principal Problem:   CAP (community acquired pneumonia) Active Problems:   Depression   Generalized anxiety disorder   Atrial fibrillation with RVR ([place])   Morbid obesity with BMI of 50.0-59.9, adult [facility])    Discharge Instructions  Discharge Instructions    Call MD for:  difficulty breathing, headache or visual disturbances   Complete by: As directed    Call MD for:  extreme fatigue   Complete by: As directed    Call MD for:  persistant dizziness or light-headedness   Complete by: As directed    Call MD for:  persistant nausea and vomiting   Complete by: As directed    Call MD for:  severe uncontrolled pain   Complete by: As directed    Call MD for:  temperature >100.4   Complete by: As directed    Diet - low sodium heart healthy   Complete by: As directed    Increase activity slowly   Complete by: As directed      Allergies as of 08/02/2019      Reactions   Nsaids Other (See Comments)   Post Gastric Bypass   Adhesive [tape] Itching, Rash   Latex Itching, Rash   Sulfa Antibiotics Itching, Rash      Medication List    TAKE these medications   acetaminophen 325 MG tablet Commonly known as: TYLENOL Take 650 mg by mouth every 6 (six) hours as needed for mild pain or fever.   apixaban 5 MG Tabs tablet Commonly known as: ELIQUIS Take 1 tablet (5 mg total) by mouth 2 (two) times daily.   azithromycin 500 MG tablet Commonly known as: Zithromax Take 1 tablet (500 mg total) by mouth daily for 3 days. Take 1 tablet daily for 3 days.   buPROPion 300 MG 24 hr tablet Commonly known as: WELLBUTRIN XL Take 300 mg by mouth daily.   cefdinir 300 MG capsule Commonly known as: OMNICEF Take 1 capsule (300 mg total) by mouth 2 (two) times daily for 5 days.   clonazePAM 1 MG  tablet Commonly known as: KLONOPIN Take 1 mg by mouth 3 (three) times daily as needed for anxiety.   diltiazem 300 MG 24 hr capsule Commonly known as: CARDIZEM CD Take 1 capsule (300 mg total) by mouth daily. Start taking on: August 03, 2019   FLUoxetine 20 MG capsule Commonly known as: PROZAC Take 20 mg by mouth at bedtime.   gabapentin 600 MG tablet Commonly known as: NEURONTIN Take 1,200 mg by mouth 3 (three) times daily.   OLANZapine 10 MG tablet Commonly known as: ZYPREXA Take 10 mg by mouth at bedtime.      Follow-up Information    Roby Lofts M., PA-C Follow up on 08/09/2019.   Specialty: Physician Assistant Why: Please arrive 15 minutes early for your 11:15am post-hospital cardiology follow-up appointment.  Contact information: [address] [place] [address] [phone]        Fanny Bien, MD. Schedule an appointment as soon as possible for a visit in 1 week(s).   Specialty: Family Medicine Contact information: [address] [place] [address] [phone]        Minus Breeding, MD .  Specialty: Cardiology Contact information: [address] [place] [address] [phone]          Allergies  Allergen Reactions  . Nsaids Other (See Comments)    Post Gastric Bypass  . Adhesive [Tape] Itching and Rash  . Latex Itching and Rash  . Sulfa Antibiotics Itching and Rash    Consultations:  Cardiology, Dr. Percival Spanish   Procedures/Studies: Dg Lumbar Spine Complete  Result Date: 07/30/2019 CLINICAL DATA:  Fall EXAM: LUMBAR SPINE - COMPLETE 4+ VIEW COMPARISON:  None. FINDINGS: There is no evidence of lumbar spine fracture. Alignment is normal. Disc space narrowing is greatest at L4-5. IMPRESSION: No fracture or listhesis of the lumbar spine. Degenerative disc disease greatest at L4-5. Electronically Signed   By: Ulyses Jarred M.D.   On: 07/30/2019 22:54   Dg Wrist Complete Right  Result  Date: 07/30/2019 CLINICAL DATA:  Fall EXAM: RIGHT WRIST - COMPLETE 3+ VIEW COMPARISON:  None. FINDINGS: There is no evidence of fracture or dislocation. There is no evidence of arthropathy or other focal bone abnormality. Soft tissues are unremarkable. IMPRESSION: Negative. Electronically Signed   By: Ulyses Jarred M.D.   On: 07/30/2019 22:53   Ct Head Wo Contrast  Result Date: 07/30/2019 CLINICAL DATA:  Fall EXAM: CT HEAD WITHOUT CONTRAST CT CERVICAL SPINE WITHOUT CONTRAST TECHNIQUE: Multidetector CT imaging of the head and cervical spine was performed following the standard protocol without intravenous contrast. Multiplanar CT image reconstructions of the cervical spine were also generated. COMPARISON:  04/14/2019 head CT FINDINGS: CT HEAD FINDINGS Brain: There is no mass, hemorrhage or extra-axial collection. The size and configuration of the ventricles and extra-axial CSF spaces are normal. The brain parenchyma is normal, without evidence of acute or chronic infarction. Vascular: No abnormal hyperdensity of the major intracranial arteries or dural venous sinuses. No intracranial atherosclerosis. Skull: The visualized skull base, calvarium and extracranial soft tissues are normal. Sinuses/Orbits: No fluid levels or advanced mucosal thickening of the visualized paranasal sinuses. No mastoid or middle ear effusion. The orbits are normal. CT CERVICAL SPINE FINDINGS Alignment: No static subluxation. Facets are aligned. Occipital condyles are normally positioned. Skull base and vertebrae: No acute fracture. Soft tissues and spinal canal: No prevertebral fluid or swelling. No visible canal hematoma. Disc levels: No advanced spinal canal or neural foraminal stenosis. Upper chest: No pneumothorax, pulmonary nodule or pleural effusion. Other: Normal visualized paraspinal cervical soft tissues. IMPRESSION: 1. No acute intracranial abnormality. 2. No acute fracture or static subluxation of the cervical spine.  Electronically Signed   By: Ulyses Jarred M.D.   On: 07/30/2019 23:00   Ct Cervical Spine Wo Contrast  Result Date: 07/30/2019 CLINICAL DATA:  Fall EXAM: CT HEAD WITHOUT CONTRAST CT CERVICAL SPINE WITHOUT CONTRAST TECHNIQUE: Multidetector CT imaging of the head and cervical spine was performed following the standard protocol without intravenous contrast. Multiplanar CT image reconstructions of the cervical spine were also generated. COMPARISON:  04/14/2019 head CT FINDINGS: CT HEAD FINDINGS Brain: There is no mass, hemorrhage or extra-axial collection. The size and configuration of the ventricles and extra-axial CSF spaces are normal. The brain parenchyma is normal, without evidence of acute or chronic infarction. Vascular: No abnormal hyperdensity of the major intracranial arteries or dural venous sinuses. No intracranial atherosclerosis. Skull: The visualized skull base, calvarium and extracranial soft tissues are normal. Sinuses/Orbits: No fluid levels or advanced mucosal thickening of the visualized paranasal sinuses. No mastoid or middle ear effusion. The orbits are normal. CT CERVICAL SPINE FINDINGS  Alignment: No static subluxation. Facets are aligned. Occipital condyles are normally positioned. Skull base and vertebrae: No acute fracture. Soft tissues and spinal canal: No prevertebral fluid or swelling. No visible canal hematoma. Disc levels: No advanced spinal canal or neural foraminal stenosis. Upper chest: No pneumothorax, pulmonary nodule or pleural effusion. Other: Normal visualized paraspinal cervical soft tissues. IMPRESSION: 1. No acute intracranial abnormality. 2. No acute fracture or static subluxation of the cervical spine. Electronically Signed   By: Ulyses Jarred M.D.   On: 07/30/2019 23:00   Dg Chest Port 1 View  Result Date: 07/30/2019 CLINICAL DATA:  Cough and fever EXAM: PORTABLE CHEST 1 VIEW COMPARISON:  04/14/2019 FINDINGS: The heart size and mediastinal contours are within normal  limits. Both lungs are clear. The visualized skeletal structures are unremarkable. IMPRESSION: No active disease. Electronically Signed   By: Ulyses Jarred M.D.   On: 07/30/2019 22:55     Transthoracic echocardiogram:  IMPRESSIONS   1. Left ventricular ejection fraction, by visual estimation, is 60 to 65%. The left ventricle has normal function. Left ventricular septal wall thickness was mildly increased. Normal left ventricular posterior wall thickness. There is mildly increased  left ventricular hypertrophy. 2. Global right ventricle has normal systolic function.The right ventricular size is mildly enlarged. No increase in right ventricular wall thickness. 3. Left atrial size was moderately dilated. 4. Right atrial size was moderately dilated. 5. The mitral valve is normal in structure. No evidence of mitral valve regurgitation. 6. The tricuspid valve is normal in structure. Tricuspid valve regurgitation is trivial. 7. The aortic valve is normal in structure. Aortic valve regurgitation was not visualized by color flow Doppler. 8. The pulmonic valve was normal in structure. Pulmonic valve regurgitation is not visualized by color flow Doppler. 9. Mildly elevated pulmonary artery systolic pressure. 10. The inferior vena cava is dilated in size with <50% respiratory variability, suggesting right atrial pressure of 15 mmHg.  Subjective: Patient seen and examined at bedside, resting comfortably.  No complaints.  Ready for discharge home.  Has follow-up with cardiology scheduled on 08/09/2019.  Denies fever/chills/night sweats, no headache, no chest pain, no palpitations, no shortness of breath, no nausea/vomiting/diarrhea, no abdominal pain, no weakness, no fatigue, no paresthesias.  No acute events overnight per nursing staff.   Discharge Exam: Vitals:   08/02/19 0435 08/02/19 1300  BP: 100/85 110/79  Pulse: 76 79  Resp: 18 17  Temp: 98.2 F (36.8 C) 98.5 F (36.9 C)  SpO2: 98%  99%   Vitals:   08/01/19 1833 08/01/19 2017 08/02/19 0435 08/02/19 1300  BP: (!) 167/112 (!) 108/57 100/85 110/79  Pulse: 81 78 76 79  Resp: (!) 22 18 18 17   Temp: 98 F (36.7 C) 97.7 F (36.5 C) 98.2 F (36.8 C) 98.5 F (36.9 C)  TempSrc: Oral Oral Oral Oral  SpO2: 97% 100% 98% 99%  Weight: (!) 143.9 kg     Height: 5' 6"  (1.676 m)       General: Pt is alert, awake, not in acute distress, obese Cardiovascular: Irregularly irregular rhythm, normal rate, S1/S2 +, no rubs, no gallops Respiratory: CTA bilaterally, no wheezing, no rhonchi Abdominal: Soft, NT, ND, bowel sounds + Extremities: no edema, no cyanosis    The results of significant diagnostics from this hospitalization (including imaging, microbiology, ancillary and laboratory) are listed below for reference.     Microbiology: Recent Results (from the past 240 hour(s))  Blood Culture (routine x 2)     Status: None (Preliminary  result)   Collection Time: 07/30/19 11:15 PM   Specimen: BLOOD  Result Value Ref Range Status   Specimen Description   Final    BLOOD LEFT ANTECUBITAL Performed at [facility] [address], [place], [place] [address]    Special Requests   Final    BOTTLES DRAWN AEROBIC AND ANAEROBIC Blood Culture adequate volume Performed at [facility] [address], [place], [place] [address]    Culture   Final    NO GROWTH 2 DAYS Performed at [facility] [address] [place], [place] [address]    Report Status PENDING  Incomplete  Blood Culture (routine x 2)     Status: None (Preliminary result)   Collection Time: 07/30/19 11:15 PM   Specimen: BLOOD  Result Value Ref Range Status   Specimen Description   Final    BLOOD BLOOD LEFT FOREARM Performed at [facility] [address] [place], [place] [address]    Special Requests   Final    Blood Culture results may not be optimal due to an inadequate volume of blood received  in culture bottles BOTTLES DRAWN AEROBIC AND ANAEROBIC Performed at [facility], [place] [address], [place], [place] [address]    Culture   Final    NO GROWTH 2 DAYS Performed at [facility] [address], [place], [place] [address]    Report Status PENDING  Incomplete  SARS Coronavirus 2 by RT PCR (hospital order, performed in [facility] hospital lab) Nasopharyngeal Nasopharyngeal Swab     Status: None   Collection Time: 07/30/19 11:38 PM   Specimen: Nasopharyngeal Swab  Result Value Ref Range Status   SARS Coronavirus 2 NEGATIVE NEGATIVE Final    Comment: (NOTE) If result is NEGATIVE SARS-CoV-2 target nucleic acids are NOT DETECTED. The SARS-CoV-2 RNA is generally detectable in upper and lower  respiratory specimens during the acute phase of infection. The lowest  concentration of SARS-CoV-2 viral copies this assay can detect is 250  copies / mL. A negative result does not preclude SARS-CoV-2 infection  and should not be used as the sole basis for treatment or other  patient management decisions.  A negative result may occur with  improper specimen collection / handling, submission of specimen other  than nasopharyngeal swab, presence of viral mutation(s) within the  areas targeted by this assay, and inadequate number of viral copies  (<250 copies / mL). A negative result must be combined with clinical  observations, patient history, and epidemiological information. If result is POSITIVE SARS-CoV-2 target nucleic acids are DETECTED. The SARS-CoV-2 RNA is generally detectable in upper and lower  respiratory specimens dur ing the acute phase of infection.  Positive  results are indicative of active infection with SARS-CoV-2.  Clinical  correlation with patient history and other diagnostic information is  necessary to determine patient infection status.  Positive results do  not rule out bacterial infection or co-infection with other viruses. If result is  PRESUMPTIVE POSTIVE SARS-CoV-2 nucleic acids MAY BE PRESENT.   A presumptive positive result was obtained on the submitted specimen  and confirmed on repeat testing.  While 2019 novel coronavirus  (SARS-CoV-2) nucleic acids may be present in the submitted sample  additional confirmatory testing may be necessary for epidemiological  and / or clinical management purposes  to differentiate between  SARS-CoV-2 and other Sarbecovirus currently known to infect humans.  If clinically indicated additional testing with an  alternate test  methodology [phone]) is advised. The SARS-CoV-2 RNA is generally  detectable in upper and lower respiratory sp ecimens during the acute  phase of infection. The expected result is Negative. Fact Sheet for Patients:  StrictlyIdeas.no Fact Sheet for Healthcare Providers: BankingDealers.co.za This test is not yet approved or cleared by the [place] FDA and has been authorized for detection and/or diagnosis of SARS-CoV-2 by FDA under an Emergency Use Authorization (EUA).  This EUA will remain in effect (meaning this test can be used) for the duration of the COVID-19 declaration under Section 564(b)(1) of the Act, 21 U.S.C. section 360bbb-3(b)(1), unless the authorization is terminated or revoked sooner. Performed at [facility], [place] [address], [place], [place] [address]   Respiratory Panel by PCR     Status: None   Collection Time: 07/31/19  4:19 AM   Specimen: Flu Kit Nasopharyngeal Swab; Respiratory  Result Value Ref Range Status   Adenovirus NOT DETECTED NOT DETECTED Final   Coronavirus 229E NOT DETECTED NOT DETECTED Final    Comment: (NOTE) The Coronavirus on the Respiratory Panel, DOES NOT test for the novel  Coronavirus (2019 nCoV)    Coronavirus HKU1 NOT DETECTED NOT DETECTED Final   Coronavirus NL63 NOT DETECTED NOT DETECTED Final   Coronavirus OC43 NOT DETECTED NOT DETECTED  Final   Metapneumovirus NOT DETECTED NOT DETECTED Final   Rhinovirus / Enterovirus NOT DETECTED NOT DETECTED Final   Influenza A NOT DETECTED NOT DETECTED Final   Influenza B NOT DETECTED NOT DETECTED Final   Parainfluenza Virus 1 NOT DETECTED NOT DETECTED Final   Parainfluenza Virus 2 NOT DETECTED NOT DETECTED Final   Parainfluenza Virus 3 NOT DETECTED NOT DETECTED Final   Parainfluenza Virus 4 NOT DETECTED NOT DETECTED Final   Respiratory Syncytial Virus NOT DETECTED NOT DETECTED Final   Bordetella pertussis NOT DETECTED NOT DETECTED Final   Chlamydophila pneumoniae NOT DETECTED NOT DETECTED Final   Mycoplasma pneumoniae NOT DETECTED NOT DETECTED Final    Comment: Performed at [facility], [place]. [address] [place], [place] [address]  MRSA PCR Screening     Status: None   Collection Time: 07/31/19  6:10 AM   Specimen: Nasal Mucosa; Nasopharyngeal  Result Value Ref Range Status   MRSA by PCR NEGATIVE NEGATIVE Final    Comment:        The GeneXpert MRSA Assay (FDA approved for NASAL specimens only), is one component of a comprehensive MRSA colonization surveillance program. It is not intended to diagnose MRSA infection nor to guide or monitor treatment for MRSA infections. Performed at [facility], [place] [address], [place], [place] [address]   SARS CORONAVIRUS 2 (TAT 6-24 HRS) Nasopharyngeal Nasopharyngeal Swab     Status: None   Collection Time: 07/31/19  8:30 AM   Specimen: Nasopharyngeal Swab  Result Value Ref Range Status   SARS Coronavirus 2 NEGATIVE NEGATIVE Final    Comment: (NOTE) SARS-CoV-2 target nucleic acids are NOT DETECTED. The SARS-CoV-2 RNA is generally detectable in upper and lower respiratory specimens during the acute phase of infection. Negative results do not preclude SARS-CoV-2 infection, do not rule out co-infections with other pathogens, and should not be used as the sole basis for treatment or other patient management  decisions. Negative results must be combined with clinical observations, patient history, and epidemiological information. The expected result is Negative. Fact Sheet for Patients: SugarRoll.be Fact Sheet for Healthcare Providers: https://www.woods-mathews.com/ This test is not yet approved or cleared  by the [place] and  has been authorized for detection and/or diagnosis of SARS-CoV-2 by FDA under an Emergency Use Authorization (EUA). This EUA will remain  in effect (meaning this test can be used) for the duration of the COVID-19 declaration under Section 56 4(b)(1) of the Act, 21 U.S.C. section 360bbb-3(b)(1), unless the authorization is terminated or revoked sooner. Performed at [facility], [place] [address] [place], [place] [address]   Urine culture     Status: Abnormal   Collection Time: 07/31/19 10:00 PM   Specimen: In/Out Cath Urine  Result Value Ref Range Status   Specimen Description   Final    IN/OUT CATH URINE Performed at [facility] [address], [place], [place] [address]    Special Requests   Final    NONE Performed at [facility], [place] [address], [place], [place] [address]    Culture (A)  Final    <10,000 COLONIES/mL INSIGNIFICANT GROWTH Performed at [facility] [address] [place], [place] [address]    Report Status 08/02/2019 FINAL  Final     Labs: BNP (last 3 results) No results for input(s): BNP in the last 8760 hours. Basic Metabolic Panel: Recent Labs  Lab 07/30/19 2315 07/31/19 0518 08/01/19 0210 08/02/19 0458  NA 136 139 141 140  K 3.9 3.6 3.2* 2.9*  CL 104 107 111 113*  CO2 24 23 22  21*  GLUCOSE 123* 133* 104* 90  BUN 13 14 10 6   CREATININE 1.01* 0.96 0.67 0.73  CALCIUM 8.2* 7.9* 7.9* 8.1*  MG  --   --  2.1 2.1   Liver Function Tests: Recent Labs  Lab 07/30/19 2315  AST 23  ALT 18  ALKPHOS 84  BILITOT 0.8  PROT 6.9   ALBUMIN 3.5   No results for input(s): LIPASE, AMYLASE in the last 168 hours. No results for input(s): AMMONIA in the last 168 hours. CBC: Recent Labs  Lab 07/30/19 2315 07/31/19 0518 08/01/19 0210 08/02/19 0458  WBC 11.1* 9.8 8.1 5.8  NEUTROABS 8.4*  --   --   --   HGB 9.6* 8.8* 8.7* 8.7*  HCT 33.4* 30.7* 30.2* 30.4*  MCV 84.1 85.3 85.3 85.9  PLT 342 279 256 264   Cardiac Enzymes: No results for input(s): CKTOTAL, CKMB, CKMBINDEX, TROPONINI in the last 168 hours. BNP: Invalid input(s): POCBNP CBG: No results for input(s): GLUCAP in the last 168 hours. D-Dimer No results for input(s): DDIMER in the last 72 hours. Hgb A1c Recent Labs    08/01/19 0210  HGBA1C 5.3   Lipid Profile Recent Labs    08/01/19 0210  CHOL 160  HDL 47  LDLCALC 98  TRIG 77  CHOLHDL 3.4   Thyroid function studies Recent Labs    08/01/19 0906  TSH 0.432   Anemia work up No results for input(s): VITAMINB12, FOLATE, FERRITIN, TIBC, IRON, RETICCTPCT in the last 72 hours. Urinalysis    Component Value Date/Time   COLORURINE YELLOW 07/31/2019 2200   APPEARANCEUR CLEAR 07/31/2019 2200   LABSPEC 1.012 07/31/2019 2200   PHURINE 5.0 07/31/2019 2200   GLUCOSEU NEGATIVE 07/31/2019 2200   HGBUR MODERATE (A) 07/31/2019 2200   BILIRUBINUR NEGATIVE 07/31/2019 2200   KETONESUR 5 (A) 07/31/2019 2200   PROTEINUR NEGATIVE 07/31/2019 2200   UROBILINOGEN 1.0 03/13/2017 1637   NITRITE NEGATIVE 07/31/2019 2200   LEUKOCYTESUR MODERATE (A) 07/31/2019 2200   Sepsis Labs Invalid input(s): PROCALCITONIN,  WBC,  [place] [facility] Recent Results (from the past 240 hour(s))  Blood Culture (routine x 2)     Status: None (Preliminary result)   Collection Time: 07/30/19 11:15 PM   Specimen: BLOOD  Result Value Ref Range Status   Specimen Description   Final    BLOOD LEFT ANTECUBITAL Performed at [facility] [address] [place], [place] [address]    Special Requests    Final    BOTTLES DRAWN AEROBIC AND ANAEROBIC Blood Culture adequate volume Performed at [facility] [address] [place], [place] [address]    Culture   Final    NO GROWTH 2 DAYS Performed at [facility] [address] [place], [place] [address]    Report Status PENDING  Incomplete  Blood Culture (routine x 2)     Status: None (Preliminary result)   Collection Time: 07/30/19 11:15 PM   Specimen: BLOOD  Result Value Ref Range Status   Specimen Description   Final    BLOOD BLOOD LEFT FOREARM Performed at [facility] [address], [place], [place] [address]    Special Requests   Final    Blood Culture results may not be optimal due to an inadequate volume of blood received in culture bottles BOTTLES DRAWN AEROBIC AND ANAEROBIC Performed at [facility], [place] [address], [place], [place] [address]    Culture   Final    NO GROWTH 2 DAYS Performed at [facility] [address], [place], [place] [address]    Report Status PENDING  Incomplete  SARS Coronavirus 2 by RT PCR (hospital order, performed in [facility] hospital lab) Nasopharyngeal Nasopharyngeal Swab     Status: None   Collection Time: 07/30/19 11:38 PM   Specimen: Nasopharyngeal Swab  Result Value Ref Range Status   SARS Coronavirus 2 NEGATIVE NEGATIVE Final    Comment: (NOTE) If result is NEGATIVE SARS-CoV-2 target nucleic acids are NOT DETECTED. The SARS-CoV-2 RNA is generally detectable in upper and lower  respiratory specimens during the acute phase of infection. The lowest  concentration of SARS-CoV-2 viral copies this assay can detect is 250  copies / mL. A negative result does not preclude SARS-CoV-2 infection  and should not be used as the sole basis for treatment or other  patient management decisions.  A negative result may occur with  improper specimen collection / handling, submission of specimen other  than nasopharyngeal swab,  presence of viral mutation(s) within the  areas targeted by this assay, and inadequate number of viral copies  (<250 copies / mL). A negative result must be combined with clinical  observations, patient history, and epidemiological information. If result is POSITIVE SARS-CoV-2 target nucleic acids are DETECTED. The SARS-CoV-2 RNA is generally detectable in upper and lower  respiratory specimens dur ing the acute phase of infection.  Positive  results are indicative of active infection with SARS-CoV-2.  Clinical  correlation with patient history and other diagnostic information is  necessary to determine patient infection status.  Positive results do  not rule out bacterial infection or co-infection with other viruses. If result is PRESUMPTIVE POSTIVE SARS-CoV-2 nucleic acids MAY BE PRESENT.   A presumptive positive result was obtained on the submitted specimen  and confirmed on repeat testing.  While 2019 novel coronavirus  (SARS-CoV-2) nucleic acids may be present in the submitted sample  additional confirmatory testing may be necessary for epidemiological  and / or clinical management purposes  to differentiate between  SARS-CoV-2 and other Sarbecovirus currently known to infect humans.  If clinically indicated additional testing with an alternate test  methodology [phone]) is advised. The SARS-CoV-2 RNA is generally  detectable in upper and lower respiratory sp ecimens during the acute  phase of infection. The expected result is Negative. Fact Sheet for Patients:  StrictlyIdeas.no Fact Sheet for Healthcare Providers: BankingDealers.co.za This test is not yet approved or cleared by the [place] FDA and has been authorized for detection and/or diagnosis of SARS-CoV-2 by FDA under an Emergency Use Authorization (EUA).  This EUA will remain in effect (meaning this test can be used) for the duration of the COVID-19 declaration under  Section 564(b)(1) of the Act, 21 U.S.C. section 360bbb-3(b)(1), unless the authorization is terminated or revoked sooner. Performed at [facility], [place] [address] [place], [place] [address]   Respiratory Panel by PCR     Status: None   Collection Time: 07/31/19  4:19 AM   Specimen: Flu Kit Nasopharyngeal Swab; Respiratory  Result Value Ref Range Status   Adenovirus NOT DETECTED NOT DETECTED Final   Coronavirus 229E NOT DETECTED NOT DETECTED Final    Comment: (NOTE) The Coronavirus on the Respiratory Panel, DOES NOT test for the novel  Coronavirus (2019 nCoV)    Coronavirus HKU1 NOT DETECTED NOT DETECTED Final   Coronavirus NL63 NOT DETECTED NOT DETECTED Final   Coronavirus OC43 NOT DETECTED NOT DETECTED Final   Metapneumovirus NOT DETECTED NOT DETECTED Final   Rhinovirus / Enterovirus NOT DETECTED NOT DETECTED Final   Influenza A NOT DETECTED NOT DETECTED Final   Influenza B NOT DETECTED NOT DETECTED Final   Parainfluenza Virus 1 NOT DETECTED NOT DETECTED Final   Parainfluenza Virus 2 NOT DETECTED NOT DETECTED Final   Parainfluenza Virus 3 NOT DETECTED NOT DETECTED Final   Parainfluenza Virus 4 NOT DETECTED NOT DETECTED Final   Respiratory Syncytial Virus NOT DETECTED NOT DETECTED Final   Bordetella pertussis NOT DETECTED NOT DETECTED Final   Chlamydophila pneumoniae NOT DETECTED NOT DETECTED Final   Mycoplasma pneumoniae NOT DETECTED NOT DETECTED Final    Comment: Performed at [facility], [place]. [address], [place], [place] [address]  MRSA PCR Screening     Status: None   Collection Time: 07/31/19  6:10 AM   Specimen: Nasal Mucosa; Nasopharyngeal  Result Value Ref Range Status   MRSA by PCR NEGATIVE NEGATIVE Final    Comment:        The GeneXpert MRSA Assay (FDA approved for NASAL specimens only), is one component of a comprehensive MRSA colonization surveillance program. It is not intended to diagnose MRSA infection nor to guide or monitor  treatment for MRSA infections. Performed at [facility], [place] [address], [place], [place] [address]   SARS CORONAVIRUS 2 (TAT 6-24 HRS) Nasopharyngeal Nasopharyngeal Swab     Status: None   Collection Time: 07/31/19  8:30 AM   Specimen: Nasopharyngeal Swab  Result Value Ref Range Status   SARS Coronavirus 2 NEGATIVE NEGATIVE Final    Comment: (NOTE) SARS-CoV-2 target nucleic acids are NOT DETECTED. The SARS-CoV-2 RNA is generally detectable in upper and lower respiratory specimens during the acute phase of infection. Negative results do not preclude SARS-CoV-2 infection, do not rule out co-infections with other pathogens, and should not be used as the sole basis for treatment or other patient management decisions. Negative results must be combined with clinical observations, patient history, and epidemiological information. The expected result  is Negative. Fact Sheet for Patients: SugarRoll.be Fact Sheet for Healthcare Providers: https://www.woods-mathews.com/ This test is not yet approved or cleared by the [place] FDA and  has been authorized for detection and/or diagnosis of SARS-CoV-2 by FDA under an Emergency Use Authorization (EUA). This EUA will remain  in effect (meaning this test can be used) for the duration of the COVID-19 declaration under Section 56 4(b)(1) of the Act, 21 U.S.C. section 360bbb-3(b)(1), unless the authorization is terminated or revoked sooner. Performed at [facility], [place] [address] [place], [place] [address]   Urine culture     Status: Abnormal   Collection Time: 07/31/19 10:00 PM   Specimen: In/Out Cath Urine  Result Value Ref Range Status   Specimen Description   Final    IN/OUT CATH URINE Performed at [facility] [address], [place], [place] [address]    Special Requests   Final    NONE Performed at [facility], [place]  [address] [place], [place] [address]    Culture (A)  Final    <10,000 COLONIES/mL INSIGNIFICANT GROWTH Performed at [facility] [address] [place], [place] [address]    Report Status 08/02/2019 FINAL  Final     Time coordinating discharge: Over 30 minutes  SIGNED:   Eric J [place] DO [facility] 08/02/2019, 2:16 PM

## 2019-08-02 NOTE — Telephone Encounter (Signed)
PT CURRENTLY ADMITTED

## 2019-08-02 NOTE — Discharge Instructions (Signed)
Atrial Fibrillation  Atrial fibrillation is a type of heartbeat that is irregular or fast (rapid). If you have this condition, your heart beats without any order. This makes it hard for your heart to pump blood in a normal way. Having this condition gives you more risk for stroke, heart failure, and other heart problems. Atrial fibrillation may start all of a sudden and then stop on its own, or it may become a long-lasting problem. What are the causes? This condition may be caused by heart conditions, such as:  High blood pressure.  Heart failure.  Heart valve disease.  Heart surgery. Other causes include:  Pneumonia.  Obstructive sleep apnea.  Lung cancer.  Thyroid disease.  Drinking too much alcohol. Sometimes the cause is not known. What increases the risk? You are more likely to develop this condition if:  You smoke.  You are older.  You have diabetes.  You are overweight.  You have a family history of this condition.  You exercise often and hard. What are the signs or symptoms? Common symptoms of this condition include:  A feeling like your heart is beating very fast.  Chest pain.  Feeling short of breath.  Feeling light-headed or weak.  Getting tired easily. Follow these instructions at home: Medicines  Take over-the-counter and prescription medicines only as told by your doctor.  If your doctor gives you a blood-thinning medicine, take it exactly as told. Taking too much of it can cause bleeding. Taking too little of it does not protect you against clots. Clots can cause a stroke. Lifestyle      Do not use any tobacco products. These include cigarettes, chewing tobacco, and e-cigarettes. If you need help quitting, ask your doctor.  Do not drink alcohol.  Do not drink beverages that have caffeine. These include coffee, soda, and tea.  Follow diet instructions as told by your doctor.  Exercise regularly as told by your doctor. General  instructions  If you have a condition that causes breathing to stop for a short period of time (apnea), treat it as told by your doctor.  Keep a healthy weight. Do not use diet pills unless your doctor says they are safe for you. Diet pills may make heart problems worse.  Keep all follow-up visits as told by your doctor. This is important. Contact a doctor if:  You notice a change in the speed, rhythm, or strength of your heartbeat.  You are taking a blood-thinning medicine and you see more bruising.  You get tired more easily when you move or exercise.  You have a sudden change in weight. Get help right away if:   You have pain in your chest or your belly (abdomen).  You have trouble breathing.  You have blood in your vomit, poop, or pee (urine).  You have any signs of a stroke. "BE FAST" is an easy way to remember the main warning signs: ? B - Balance. Signs are dizziness, sudden trouble walking, or loss of balance. ? E - Eyes. Signs are trouble seeing or a change in how you see. ? F - Face. Signs are sudden weakness or loss of feeling in the face, or the face or eyelid drooping on one side. ? A - Arms. Signs are weakness or loss of feeling in an arm. This happens suddenly and usually on one side of the body. ? S - Speech. Signs are sudden trouble speaking, slurred speech, or trouble understanding what people say. ? T -  Time. Time to call emergency services. Write down what time symptoms started.  You have other signs of a stroke, such as: ? A sudden, very bad headache with no known cause. ? Feeling sick to your stomach (nausea). ? Throwing up (vomiting). ? Jerky movements you cannot control (seizure). These symptoms may be an emergency. Do not wait to see if the symptoms will go away. Get medical help right away. Call your local emergency services (911 in the [place] Do not drive yourself to the hospital. Summary  Atrial fibrillation is a type of heartbeat that is irregular  or fast (rapid).  You are at higher risk of this condition if you smoke, are older, have diabetes, or are overweight.  Follow your doctor's instructions about medicines, diet, exercise, and follow-up visits.  Get help right away if you think that you have signs of a stroke. This information is not intended to replace advice given to you by your health care provider. Make sure you discuss any questions you have with your health care provider. Document Released: 06/29/2008 Document Revised: 11/24/2017 Document Reviewed: 11/11/2017 Elsevier Patient Education  [employer]. It is very important that you do not miss any doses of your apixaban (eliquis), as this can interfere with plans to get you back into a normal heart rhythm.   Information on my medicine - ELIQUIS (apixaban)  This medication education was reviewed with me or my healthcare representative as part of my discharge preparation.   Why was Eliquis prescribed for you? Eliquis was prescribed for you to reduce the risk of a blood clot forming that can cause a stroke if you have a medical condition called atrial fibrillation (a type of irregular heartbeat).  What do You need to know about Eliquis ? Take your Eliquis TWICE DAILY - one tablet in the morning and one tablet in the evening with or without food. If you have difficulty swallowing the tablet whole please discuss with your pharmacist how to take the medication safely.  Take Eliquis exactly as prescribed by your doctor and DO NOT stop taking Eliquis without talking to the doctor who prescribed the medication.  Stopping may increase your risk of developing a stroke.  Refill your prescription before you run out.  After discharge, you should have regular check-up appointments with your healthcare provider that is prescribing your Eliquis.  In the future your dose may need to be changed if your kidney function or weight changes by a significant amount or as you get  older.  What do you do if you miss a dose? If you miss a dose, take it as soon as you remember on the same day and resume taking twice daily.  Do not take more than one dose of ELIQUIS at the same time to make up a missed dose.  Important Safety Information A possible side effect of Eliquis is bleeding. You should call your healthcare provider right away if you experience any of the following: ? Bleeding from an injury or your nose that does not stop. ? Unusual colored urine (red or dark brown) or unusual colored stools (red or black). ? Unusual bruising for unknown reasons. ? A serious fall or if you hit your head (even if there is no bleeding).  Some medicines may interact with Eliquis and might increase your risk of bleeding or clotting while on Eliquis. To help avoid this, consult your healthcare provider or pharmacist prior to using any new prescription or non-prescription medications, including herbals, vitamins, non-steroidal  anti-inflammatory drugs (NSAIDs) and supplements.  This website has more information on Eliquis (apixaban): http://www.eliquis.com/eliquis/home  _____________________________________________________________________________________________________  Please keep a log of your blood pressure at home. You can pick up a blood pressure cuff at your pharmacy. Monitor your blood pressure and heart rate in the morning and at night and write them down. Bring the log with you to your outpatient cardiology appointment to determine if additional blood pressure medications are needed.  _____________________________________________________________________________________________________

## 2019-08-02 NOTE — TOC Transition Note (Signed)
Transition of Care [facility]) - CM/SW Discharge Note   Patient Details  Name: Tammy [name] MRN: [id number] Date of Birth: 06/27/1961  [facility] CM/SW Contact:  Dessa Phi, RN Phone Number: 08/02/2019, 2:44 PM   Clinical Narrative: Patient has pcp, nsg to inform patient about good.rx website for discount coupons. Pharmacy to provide eliquis free coupon in kit. No further CM needs.            Patient Goals and CMS Choice        Discharge Placement                       Discharge Plan and Services                                     Social Determinants of Health (SDOH) Interventions     Readmission Risk Interventions No flowsheet data found.

## 2019-08-02 NOTE — TOC Progression Note (Signed)
Transition of Care [facility]) - Progression Note    Patient Details  Name: Tammy [name] MRN: [id number] Date of Birth: 06/03/1961  Transition of Care [facility]) CM/SW Contact  Mahabir, Juliann Pulse, RN Phone Number: 08/02/2019, 9:17 AM  Clinical Narrative:CM referral for eliquis-pharmacy can provide an eliquis kit coupon. Patient can go to the [facility] for a 1 time free fill.            Expected Discharge Plan and Services                                                 Social Determinants of Health (SDOH) Interventions    Readmission Risk Interventions No flowsheet data found.

## 2019-08-03 MED FILL — CARTIA XT 300 MG CAPSULE SA: 300 | 30 days supply | Qty: 30 | Fill #0

## 2019-08-03 NOTE — Telephone Encounter (Signed)
No answer. Left message to call back.   

## 2019-08-05 LAB — CULTURE, BLOOD (ROUTINE X 2)
Culture: NO GROWTH
Culture: NO GROWTH
Special Requests: ADEQUATE

## 2019-08-07 NOTE — Telephone Encounter (Signed)
Patient contacted regarding discharge from [facility] on 10/30.  Patient understands to follow up with provider Roby Lofts, PA on 11/05 at 11:00 AM at [facility]. Patient understands discharge instructions? yes Patient understands medications and regiment? yes Patient understands to bring all medications to this visit? yes

## 2019-08-09 ENCOUNTER — Encounter: Payer: Self-pay | Admitting: Adult Health

## 2019-08-09 ENCOUNTER — Ambulatory Visit: Payer: Self-pay | Admitting: Medical

## 2019-08-09 ENCOUNTER — Telehealth (INDEPENDENT_AMBULATORY_CARE_PROVIDER_SITE_OTHER): Payer: Self-pay | Admitting: Adult Health

## 2019-08-09 VITALS — BP 127/78 | HR 88 | Ht 66.0 in | Wt 315.0 lb

## 2019-08-09 DIAGNOSIS — I4891 Unspecified atrial fibrillation: Secondary | ICD-10-CM

## 2019-08-09 DIAGNOSIS — R0683 Snoring: Secondary | ICD-10-CM

## 2019-08-09 DIAGNOSIS — G4730 Sleep apnea, unspecified: Secondary | ICD-10-CM

## 2019-08-09 DIAGNOSIS — R600 Localized edema: Secondary | ICD-10-CM

## 2019-08-09 MED ORDER — FUROSEMIDE 20 MG PO TABS: 20.0000 mg | ORAL_TABLET | Freq: Every day | ORAL | 3 refills | Status: DC

## 2019-08-09 NOTE — Progress Notes (Signed)
Virtual Visit via Video Note   This visit type was conducted due to national recommendations for restrictions regarding the COVID-19 Pandemic (e.g. social distancing) in an effort to limit this patient's exposure and mitigate transmission in our community.  Due to her co-morbid illnesses, this patient is at least at moderate risk for complications without adequate follow up.  This format is felt to be most appropriate for this patient at this time.  All issues noted in this document were discussed and addressed.  A limited physical exam was performed with this format.  Please refer to the patient's chart for her consent to telehealth for [facility].   Date:  08/09/2019   ID:  Tammy [name], DOB [birth year], MRN [id number]  Patient Location: Home Provider Location: Home  PCP:  Fanny Bien, MD  Cardiologist:  Minus Breeding, MD  Electrophysiologist:  None   Evaluation Performed:  Follow-Up Visit  Chief Complaint:  LEE Edema  History of Present Illness:    Tammy [name] is a 58 y.o. female who presents for post hospital follow up after being seen on consultation by Dr. Percival Spanish in the setting of new onset atrial fibrillation with RVR on 07/31/2019.  The patient was admitted with pneumonia and sepsis.  She also experienced some chest pain.  The patient was febrile with a temperature of 101.6.  She was found to be negative for COVID and the Flu.  It was felt that the atrial fibrillation was related to physiologic stress in the setting of pulmonary infection.  Echocardiogram was ordered.  Echo on 07/31/2019 revealed normal LVEF of 60% to 65%.  LV had normal function.  Left atrial size was moderately dilated.  She did have evidence of increased left ventricular hypertrophy, with mildly elevated pulmonary pressure.  Due to a CHADS VASC Score of 2, she was started on Eliquis 5 mg BID, and was transitioned from IV diltiazem to po diltiazem 300 mg daily.  If she remained in atrial  fibrillation after a minimum of 4 weeks on apixaban, she would be considered for DCCV as outpatient.  Also once recovered from illness she would be considered for 2-day Lexiscan due to elevated troponin which was felt to be demand ischemia.  She was to keep a blood pressure recording each day at home and let us know how she was doing with her blood pressures.  I am seeing her via video visit today and she has multiple complaints.  Main complaint is lower extremity edema causing lower extremity pain.  She states that she was not troubled by this prior to hospitalization.  She states that she cannot put on her normal shoes, skin feels tighter, with no significant improvement with raising her legs.  She also has some mild confusion residual from hospitalization.  She is on multiple psychotropic medications.  Her husband who is with her wonders if there is a drug interaction with the diltiazem and Eliquis.  They are also requesting medication for pain control.  Her husband also states that she snores heavily at night, sometimes stops breathing, and is often very short of breath during the day.  He thought this is left over from her pneumonia.  However this confusion and shortness of breath has continued.  She states she continues to have some mild wheezing and coughing since being home from the hospital.  She has no idea if she is gained weight since returning home.  She and her husband state that they avoid salted foods.  She denies bleeding, excessive bruising, or chest pain since leaving the hospital.  She states she can tell that her heart rate is sometimes irregular, but denies racing heart rate.  She is often tired during the day and falls asleep easily when she sits for prolonged periods of time.   The patient  have symptoms concerning for COVID-19 infection (fever, chills, cough, or new shortness of breath).    Past Medical History:  Diagnosis Date  . Anxiety   . Arthritis    neck and lower back  - deg disc disease, fingers  . Depression   . Frequency of urination   . GERD (gastroesophageal reflux disease)   . History of seizure per pt no seizure since    11-07-2015  in setting of stress and sleep deprivation  /  negative work-up by neurologist unknown idiology  . Hypothyroidism   . Injury of thumb, left    03-31-2016    . Pelvic relaxation   . SUI (stress urinary incontinence, female)    Past Surgical History:  Procedure Laterality Date  . ANTERIOR AND POSTERIOR REPAIR  09/25/2012   Procedure: ANTERIOR (CYSTOCELE) AND POSTERIOR REPAIR (RECTOCELE);  Surgeon: Lahoma Crocker, MD;  Location: [facility];  Service: Gynecology;  Laterality: N/A;  . ANTERIOR AND POSTERIOR REPAIR WITH SACROSPINOUS FIXATION N/A 04/12/2016   Procedure: ANTERIOR AND POSTERIOR REPAIR WITH SACROSPINOUS LIGAMENT SUSPENSION, CYSTOSCOPY;  Surgeon: Arvella Nigh, MD;  Location: [facility];  Service: Gynecology;  Laterality: N/A;  . APPENDECTOMY  1987  . BREAST EXCISIONAL BIOPSY Left    No scar visable   . BREAST REDUCTION SURGERY  2000  aprrox  . ENDOMETRIAL ABLATION W/ NOVASURE  2011   in office  . EXCISION LEFT BREAST BX  12-07-1999   benign  . LAPAROSCOPIC CHOLECYSTECTOMY  02-09-2005  . LAPAROSCOPIC VAGINAL HYSTERECTOMY WITH SALPINGO OOPHORECTOMY Bilateral 04/12/2016   Procedure: LAPAROSCOPIC ASSISTED VAGINAL HYSTERECTOMY WITH SALPINGO OOPHORECTOMY;  Surgeon: Arvella Nigh, MD;  Location: [facility];  Service: Gynecology;  Laterality: Bilateral;  . REDUCTION MAMMAPLASTY Bilateral   . ROUX-EN-Y GASTRIC BYPASS  06/  2002  . TUBAL LIGATION  10/ 1990     Current Meds  Medication Sig  . acetaminophen (TYLENOL) 325 MG tablet Take 650 mg by mouth every 6 (six) hours as needed for mild pain or fever.  Marland Kitchen apixaban (ELIQUIS) 5 MG TABS tablet Take 1 tablet (5 mg total) by mouth 2 (two) times daily.  Marland Kitchen buPROPion (WELLBUTRIN XL) 300 MG 24 hr tablet Take 300 mg by mouth daily.   .  clonazePAM (KLONOPIN) 1 MG tablet Take 1 mg by mouth 3 (three) times daily as needed for anxiety.  Marland Kitchen diltiazem (CARDIZEM CD) 300 MG 24 hr capsule Take 1 capsule (300 mg total) by mouth daily.  Marland Kitchen FLUoxetine (PROZAC) 20 MG capsule Take 20 mg by mouth at bedtime.   . gabapentin (NEURONTIN) 600 MG tablet Take 1,200 mg by mouth as needed.   Marland Kitchen OLANZapine (ZYPREXA) 10 MG tablet Take 10 mg by mouth at bedtime.     Allergies:   Nsaids, Adhesive [tape], Latex, and Sulfa antibiotics   Social History   Tobacco Use  . Smoking status: Never Smoker  . Smokeless tobacco: Never Used  Substance Use Topics  . Alcohol use: No    Comment: drinks daily - none since 08/2012 per pt.  . Drug use: No     Family Hx: The patient's family history includes Breast cancer in her maternal  aunt; Colon cancer in her maternal uncle; Colon polyps in her mother; Diabetes in her maternal aunt, maternal grandmother, and maternal uncle; Heart disease in her maternal grandmother and paternal grandmother; Heart disease (age of onset: 15) in her father; Stomach cancer in her maternal grandmother and maternal uncle. There is no history of Esophageal cancer, Gallbladder disease, or Seizures.  ROS:   Please see the history of present illness.    All other systems reviewed and are negative.   Prior CV studies:   The following studies were reviewed today:  Echocardiogram 07/31/2019 1. Left ventricular ejection fraction, by visual estimation, is 60 to 65%. The left ventricle has normal function. Left ventricular septal wall thickness was mildly increased. Normal left ventricular posterior wall thickness. There is mildly increased  left ventricular hypertrophy.  2. Global right ventricle has normal systolic function.The right ventricular size is mildly enlarged. No increase in right ventricular wall thickness.  3. Left atrial size was moderately dilated.  4. Right atrial size was moderately dilated.  5. The mitral valve is normal  in structure. No evidence of mitral valve regurgitation.  6. The tricuspid valve is normal in structure. Tricuspid valve regurgitation is trivial.  7. The aortic valve is normal in structure. Aortic valve regurgitation was not visualized by color flow Doppler.  8. The pulmonic valve was normal in structure. Pulmonic valve regurgitation is not visualized by color flow Doppler.  9. Mildly elevated pulmonary artery systolic pressure. 10. The inferior vena cava is dilated in size with <50% respiratory variability, suggesting right atrial pressure of 15 mmHg.  Labs/Other Tests and Data Reviewed:    EKG:    Recent Labs: 07/30/2019: ALT 18 08/01/2019: TSH 0.432 08/02/2019: BUN 6; Creatinine, Ser 0.73; Hemoglobin 8.7; Magnesium 2.1; Platelets 264; Potassium 2.9; Sodium 140   Recent Lipid Panel Lab Results  Component Value Date/Time   CHOL 160 08/01/2019 02:10 AM   TRIG 77 08/01/2019 02:10 AM   HDL 47 08/01/2019 02:10 AM   CHOLHDL 3.4 08/01/2019 02:10 AM   LDLCALC 98 08/01/2019 02:10 AM    Wt Readings from Last 3 Encounters:  08/09/19 (!) 315 lb (142.9 kg)  08/01/19 (!) 317 lb 3.9 oz (143.9 kg)  04/14/19 229 lb 15 oz (104.3 kg)     Objective:    Vital Signs:  BP 127/78   Pulse 88   Ht 5\' 6"  (1.676 m)   Wt (!) 315 lb (142.9 kg)   LMP 09/30/2012   BMI 50.84 kg/m    VITAL SIGNS:  reviewed GEN:  no acute distress RESPIRATORY:  normal respiratory effort, symmetric expansion NEURO:  Sleepy, having some trouble remembering her recent hospitalization. Slow speech.  PSYCH:  Flat affect  ASSESSMENT & PLAN:    1.  New onset atrial fib with RVR: She is now on diltiazem 300 mg daily and Eliquis 5 mg twice daily.  She complains of significant lower extremity edema since starting this medication.  She also states that she has a little bit of wheezing.  Weight is 315 pounds.  Discharge weight was actually recorded higher at 329 pounds.  Uncertain of accuracy of weights.  She will continue  on diltiazem 300 mg daily as directed along with Eliquis.  I am giving her Lasix 20 mg which she is to take daily for the next 4 days to assist with lower extremity edema and fluid retention.  I have also explained to her that use of diltiazem can cause some dependent edema.  After 4  days of taking medication, she is to stop and use it as needed only.  Uncertain if lower extremity edema is medication related versus atrial fibrillated.  Begin to see her on close follow-up in 2 weeks to evaluate her response to medications, check her weight, and edema.  It is recommended during hospitalization that she wear a cardiac monitor to to evaluate for heart rate control on diltiazem.  I will order Zio 7-day monitor for her to wear to evaluate heart rates at home.  We will have a follow-up EKG on next office visit.  2.  Hypertension: Blood pressures currently well controlled.  She is complaining of a good bit of lower extremity pain from the swelling.  She is requesting pain control.  Have advised her to follow-up with her PCP for recommendations and prescription if clinically warranted.  Cardiology will not be managing pain control.  3. OSA: Husband reports that she snores heavily and sometimes stops breathing, she does have daytime somnolence, dyspnea on exertion, she has morbid obesity.  It is likely that she has some aspect of obstructive sleep apnea but will order a sleep study for verification.  4. Anxiety: On multiple medications. Is followed by psychiatry She is to see them soon for recommendations concerning her confusion.   COVID-19 Education: The signs and symptoms of COVID-19 were discussed with the patient and how to seek care for testing (follow up with PCP or arrange E-visit).The importance of social distancing was discussed today.  Time:   Today, I have spent 20 minutes with the patient with telehealth technology discussing the above problems.     Medication Adjustments/Labs and Tests Ordered:  Current medicines are reviewed at length with the patient today.  Concerns regarding medicines are outlined above.   Tests Ordered: No orders of the defined types were placed in this encounter.   Medication Changes: No orders of the defined types were placed in this encounter.   Disposition:  Follow up 2 weeks in person.  Will need EKG at [employer], Phill Myron. [place] Pugh, ANP, AACC  08/09/2019 11:12 AM    [facility]

## 2019-08-09 NOTE — Patient Instructions (Signed)
Medication Instructions:  START- Furosemide(Lasix) 20 mg take 1 tablets daily for the next 4 days, then take lasix only as needed  *If you need a refill on your cardiac medications before your next appointment, please call your pharmacy*  Lab Work: None Ordered  Testing/Procedures: Your physician has recommended that you wear a 7 days Zio Patch monitor. Holter monitors are medical devices that record the heart's electrical activity. Doctors most often use these monitors to diagnose arrhythmias. Arrhythmias are problems with the speed or rhythm of the heartbeat. The monitor is a small, portable device. You can wear one while you do your normal daily activities. This is usually used to diagnose what is causing palpitations/syncope (passing out).  Your physician has recommended that you have a sleep study. This test records several body functions during sleep, including: brain activity, eye movement, oxygen and carbon dioxide blood levels, heart rate and rhythm, breathing rate and rhythm, the flow of air through your mouth and nose, snoring, body muscle movements, and chest and belly movement.    Follow-Up: At [facility], you and your health needs are our priority.  As part of our continuing mission to provide you with exceptional heart care, we have created designated Provider Care Teams.  These Care Teams include your primary Cardiologist (physician) and Advanced Practice Providers (APPs -  Physician Assistants and Nurse Practitioners) who all work together to provide you with the care you need, when you need it.  Your next appointment:   2 weeks  The format for your next appointment:   In Person  Provider:   Jory Sims, DNP, ANP

## 2019-08-13 ENCOUNTER — Other Ambulatory Visit: Payer: Self-pay | Admitting: Family Medicine

## 2019-08-13 ENCOUNTER — Ambulatory Visit
Admission: RE | Admit: 2019-08-13 | Discharge: 2019-08-13 | Disposition: A | Discharge status: Home / Self Care | Payer: No Typology Code available for payment source | Source: Ambulatory Visit | Attending: Family Medicine | Admitting: Family Medicine

## 2019-08-13 ENCOUNTER — Other Ambulatory Visit: Payer: Self-pay

## 2019-08-13 DIAGNOSIS — J189 Pneumonia, unspecified organism: Secondary | ICD-10-CM

## 2019-08-14 ENCOUNTER — Other Ambulatory Visit: Payer: Self-pay | Admitting: Family Medicine

## 2019-08-14 ENCOUNTER — Ambulatory Visit
Admission: RE | Admit: 2019-08-14 | Discharge: 2019-08-14 | Disposition: A | Discharge status: Home / Self Care | Payer: No Typology Code available for payment source | Source: Ambulatory Visit | Attending: Family Medicine | Admitting: Family Medicine

## 2019-08-14 DIAGNOSIS — J189 Pneumonia, unspecified organism: Secondary | ICD-10-CM

## 2019-08-21 ENCOUNTER — Telehealth: Payer: Self-pay | Admitting: *Deleted

## 2019-08-21 NOTE — Telephone Encounter (Signed)
7 day ZIO XT long term holter monitor to be mailed to the patients home.  Instructions reviewed briefly as they are included in the monitor kit. 

## 2019-08-25 ENCOUNTER — Ambulatory Visit (INDEPENDENT_AMBULATORY_CARE_PROVIDER_SITE_OTHER): Payer: Self-pay

## 2019-08-25 DIAGNOSIS — I4891 Unspecified atrial fibrillation: Secondary | ICD-10-CM

## 2019-09-03 ENCOUNTER — Other Ambulatory Visit: Payer: Self-pay

## 2019-09-03 MED ORDER — APIXABAN 5 MG PO TABS: 5.0000 mg | ORAL_TABLET | Freq: Two times a day (BID) | ORAL | 1 refills | Status: DC

## 2019-09-04 ENCOUNTER — Telehealth: Payer: Self-pay | Admitting: Adult Health

## 2019-09-04 MED ORDER — DILTIAZEM HCL ER COATED BEADS 300 MG PO CP24: 300.0000 mg | ORAL_CAPSULE | Freq: Every day | ORAL | 1 refills | Status: DC

## 2019-09-04 NOTE — Telephone Encounter (Signed)
New message   Patient wants to know if there is a blood thinner that is cheaper than Eliquis and also diltiazem (CARDIZEM CD) 300 MG 24 hr capsule(Expired) wants a new prescription if she needs to stay on this medication. Please advise.

## 2019-09-04 NOTE — Telephone Encounter (Signed)
Sent in RX to [facility] to patient she states that the Eliquis is too expensive- I asked if she had tried patient assistance, and she said that she's afraid it would take too long and she would run out of meds. I advised patient we could start the process of sending her paperwork to her, and see if we have samples and the primary nurse would call her to come pick up- patient verbalized understanding and was thankful for call. Will route to primary nurse to advise to send pt assistance and see about samples.

## 2019-09-05 NOTE — Telephone Encounter (Signed)
No samples Spoke with patient and she will have insurance in January and wanted to change to Warfarin until then. Spoke with Pharm D and she had enough Eliquis to las until January Advised patient and she will pick up today  Eliquis 5 mg #5 lot [id number] exp 1/23

## 2019-09-14 ENCOUNTER — Telehealth: Payer: Self-pay | Admitting: Adult Health

## 2019-09-14 NOTE — Telephone Encounter (Signed)
Spoke with Tammy [name] with [facility] and she called to report the pt had a Zio patch and it revealed Rapid Aflutter  With average rate 195 over 60 seconds on 09/01/19 at 12:45 pm.    Will forward to Dr. Hans Eden for review.. full monitor report is being uploaded for review. PT has been put on Diltiazem and Eliquis.

## 2019-09-14 NOTE — Telephone Encounter (Signed)
I am out of the office Monday will forward to Evette Cristal RN to call and get patient scheduled  Left message will call back Monday

## 2019-09-14 NOTE — Telephone Encounter (Signed)
Raquel Sarna from [facility] calling with abnormal cardiac results.   Reference number: [id number]

## 2019-09-14 NOTE — Telephone Encounter (Signed)
We need to understand how much of the time she is in flutter with this rapid rate.  She might need TEE DCCV.  I am virtual on Monday.  Could refer to [facility] on Monday if they have opening.

## 2019-09-17 ENCOUNTER — Telehealth: Payer: Self-pay | Admitting: Adult Health

## 2019-09-17 NOTE — Telephone Encounter (Signed)
Pt aware of appt , location and parking code to enter garage .Tammy [name]

## 2019-09-17 NOTE — Telephone Encounter (Signed)
Left message for pt to call, appointment with a fib clinic tomorrow at 3 :30 pm unless patient having problems today.

## 2019-09-17 NOTE — Telephone Encounter (Signed)
Patient returning Debra's call. I told her that she has an appt with the a fib clinic tomorrow at 3:30.

## 2019-09-17 NOTE — Telephone Encounter (Signed)
Spoke with pt, aware of appointment date and time.

## 2019-09-18 ENCOUNTER — Encounter (HOSPITAL_COMMUNITY): Payer: Self-pay | Admitting: Nurse Practitioner

## 2019-09-18 ENCOUNTER — Other Ambulatory Visit: Payer: Self-pay

## 2019-09-18 ENCOUNTER — Ambulatory Visit (HOSPITAL_COMMUNITY)
Admission: RE | Admit: 2019-09-18 | Discharge: 2019-09-18 | Disposition: A | Discharge status: Home / Self Care | Payer: Medicaid Other | Source: Ambulatory Visit | Attending: Nurse Practitioner | Admitting: Nurse Practitioner

## 2019-09-18 VITALS — BP 134/78 | HR 81 | Ht 66.0 in | Wt 316.6 lb

## 2019-09-18 DIAGNOSIS — Z791 Long term (current) use of non-steroidal anti-inflammatories (NSAID): Secondary | ICD-10-CM | POA: Insufficient documentation

## 2019-09-18 DIAGNOSIS — Z882 Allergy status to sulfonamides status: Secondary | ICD-10-CM | POA: Insufficient documentation

## 2019-09-18 DIAGNOSIS — I4891 Unspecified atrial fibrillation: Secondary | ICD-10-CM | POA: Insufficient documentation

## 2019-09-18 DIAGNOSIS — G4730 Sleep apnea, unspecified: Secondary | ICD-10-CM | POA: Insufficient documentation

## 2019-09-18 DIAGNOSIS — Z803 Family history of malignant neoplasm of breast: Secondary | ICD-10-CM | POA: Insufficient documentation

## 2019-09-18 DIAGNOSIS — M1389 Other specified arthritis, multiple sites: Secondary | ICD-10-CM | POA: Insufficient documentation

## 2019-09-18 DIAGNOSIS — Z79899 Other long term (current) drug therapy: Secondary | ICD-10-CM | POA: Insufficient documentation

## 2019-09-18 DIAGNOSIS — Z91040 Latex allergy status: Secondary | ICD-10-CM | POA: Insufficient documentation

## 2019-09-18 DIAGNOSIS — Z800 Family history of malignant neoplasm of digestive organs: Secondary | ICD-10-CM | POA: Insufficient documentation

## 2019-09-18 DIAGNOSIS — Z9851 Tubal ligation status: Secondary | ICD-10-CM | POA: Insufficient documentation

## 2019-09-18 DIAGNOSIS — A419 Sepsis, unspecified organism: Secondary | ICD-10-CM | POA: Insufficient documentation

## 2019-09-18 DIAGNOSIS — D6869 Other thrombophilia: Secondary | ICD-10-CM

## 2019-09-18 DIAGNOSIS — Z90710 Acquired absence of both cervix and uterus: Secondary | ICD-10-CM | POA: Insufficient documentation

## 2019-09-18 DIAGNOSIS — Z9049 Acquired absence of other specified parts of digestive tract: Secondary | ICD-10-CM | POA: Insufficient documentation

## 2019-09-18 DIAGNOSIS — F419 Anxiety disorder, unspecified: Secondary | ICD-10-CM | POA: Insufficient documentation

## 2019-09-18 DIAGNOSIS — Z888 Allergy status to other drugs, medicaments and biological substances status: Secondary | ICD-10-CM | POA: Insufficient documentation

## 2019-09-18 DIAGNOSIS — Z886 Allergy status to analgesic agent status: Secondary | ICD-10-CM | POA: Insufficient documentation

## 2019-09-18 DIAGNOSIS — R0683 Snoring: Secondary | ICD-10-CM | POA: Insufficient documentation

## 2019-09-18 DIAGNOSIS — Z8249 Family history of ischemic heart disease and other diseases of the circulatory system: Secondary | ICD-10-CM | POA: Insufficient documentation

## 2019-09-18 DIAGNOSIS — F329 Major depressive disorder, single episode, unspecified: Secondary | ICD-10-CM | POA: Insufficient documentation

## 2019-09-18 DIAGNOSIS — I4892 Unspecified atrial flutter: Secondary | ICD-10-CM | POA: Insufficient documentation

## 2019-09-18 DIAGNOSIS — J189 Pneumonia, unspecified organism: Secondary | ICD-10-CM | POA: Insufficient documentation

## 2019-09-18 DIAGNOSIS — Z7901 Long term (current) use of anticoagulants: Secondary | ICD-10-CM | POA: Insufficient documentation

## 2019-09-18 DIAGNOSIS — Z833 Family history of diabetes mellitus: Secondary | ICD-10-CM | POA: Insufficient documentation

## 2019-09-18 MED ORDER — METOPROLOL SUCCINATE ER 25 MG PO TB24: 25.0000 mg | ORAL_TABLET | Freq: Every day | ORAL | 2 refills | Status: DC

## 2019-09-18 MED ORDER — FUROSEMIDE 20 MG PO TABS: 40.0000 mg | ORAL_TABLET | Freq: Two times a day (BID) | ORAL | Status: DC

## 2019-09-18 NOTE — Patient Instructions (Signed)
Start Metoprolol 25mg  taking at bedtime

## 2019-09-18 NOTE — Progress Notes (Addendum)
Primary Care Physician: Fanny Bien, MD Referring Physician: Dr. Nuala Alpha Tammy [name] is a 58 y.o. female with a h/o community acquired pneumonia with sepsis and acute hypoxic respiratory failure, 07/2019. She was found to have new onset atrial fibrillation. She was discharged on Cardizem and eliquis 5 mg bid for a CHA2DS2VASc score of 2.  She then had a zio patch placed with SR as predominate rhythm with 7% atrial flutter shown. She was referred to the afib clinic. Pt states that she snores with witnessed apnea. A sleep study was recommended at time of discharge. She also drinks 4 18 oz glasses of tea a day. No tobacco or alcohol use. Sedentary with a BMI of 51.10 kg. She does not really feel any arrhythmia's reported on the monitor.    Today, she denies symptoms of palpitations, chest pain, shortness of breath, orthopnea, PND, lower extremity edema, dizziness, presyncope, syncope, or neurologic sequela. The patient is tolerating medications without difficulties and is otherwise without complaint today.   Past Medical History:  Diagnosis Date  . Anxiety   . Arthritis    neck and lower back - deg disc disease, fingers  . Depression   . Frequency of urination   . GERD (gastroesophageal reflux disease)   . History of seizure per pt no seizure since    11-07-2015  in setting of stress and sleep deprivation  /  negative work-up by neurologist unknown idiology  . Hypothyroidism   . Injury of thumb, left    03-31-2016    . Pelvic relaxation   . SUI (stress urinary incontinence, female)    Past Surgical History:  Procedure Laterality Date  . ANTERIOR AND POSTERIOR REPAIR  09/25/2012   Procedure: ANTERIOR (CYSTOCELE) AND POSTERIOR REPAIR (RECTOCELE);  Surgeon: Lahoma Crocker, MD;  Location: [facility];  Service: Gynecology;  Laterality: N/A;  . ANTERIOR AND POSTERIOR REPAIR WITH SACROSPINOUS FIXATION N/A 04/12/2016   Procedure: ANTERIOR AND POSTERIOR REPAIR WITH SACROSPINOUS  LIGAMENT SUSPENSION, CYSTOSCOPY;  Surgeon: Arvella Nigh, MD;  Location: [facility];  Service: Gynecology;  Laterality: N/A;  . APPENDECTOMY  1987  . BREAST EXCISIONAL BIOPSY Left    No scar visable   . BREAST REDUCTION SURGERY  2000  aprrox  . ENDOMETRIAL ABLATION W/ NOVASURE  2011   in office  . EXCISION LEFT BREAST BX  12-07-1999   benign  . LAPAROSCOPIC CHOLECYSTECTOMY  02-09-2005  . LAPAROSCOPIC VAGINAL HYSTERECTOMY WITH SALPINGO OOPHORECTOMY Bilateral 04/12/2016   Procedure: LAPAROSCOPIC ASSISTED VAGINAL HYSTERECTOMY WITH SALPINGO OOPHORECTOMY;  Surgeon: Arvella Nigh, MD;  Location: [facility];  Service: Gynecology;  Laterality: Bilateral;  . REDUCTION MAMMAPLASTY Bilateral   . ROUX-EN-Y GASTRIC BYPASS  06/  2002  . TUBAL LIGATION  10/ 1990    Current Outpatient Medications  Medication Sig Dispense Refill  . acetaminophen (TYLENOL) 325 MG tablet Take 650 mg by mouth every 6 (six) hours as needed for mild pain or fever.    Marland Kitchen apixaban (ELIQUIS) 5 MG TABS tablet Take 1 tablet (5 mg total) by mouth 2 (two) times daily. 180 tablet 1  . buPROPion (WELLBUTRIN XL) 300 MG 24 hr tablet Take 300 mg by mouth daily.   10  . clonazePAM (KLONOPIN) 1 MG tablet Take 1 mg by mouth 4 (four) times daily as needed for anxiety.     . diclofenac Sodium (VOLTAREN) 1 % GEL APPLY SPARINGLY TO AFFECTED AREA(S) ONCE DAILY EQUIVALENT TO DICLOFENAC    GEL  1%    . diltiazem (CARDIZEM CD) 300 MG 24 hr capsule Take 1 capsule (300 mg total) by mouth daily. 90 capsule 1  . FLUoxetine (PROZAC) 20 MG capsule Take 20 mg by mouth at bedtime.   11  . furosemide (LASIX) 20 MG tablet Take 2 tablets (40 mg total) by mouth 2 (two) times daily.    Marland Kitchen gabapentin (NEURONTIN) 600 MG tablet Take 1,200 mg by mouth as needed.   11  . OLANZapine (ZYPREXA) 10 MG tablet Take 10 mg by mouth at bedtime.    . Vitamins/Minerals TABS Take by mouth.    . metoprolol succinate (TOPROL-XL) 25 MG 24 hr tablet Take 1  tablet (25 mg total) by mouth at bedtime. 30 tablet 2   No current facility-administered medications for this encounter.    Allergies  Allergen Reactions  . Nsaids Other (See Comments)    Post Gastric Bypass  . Adhesive [Tape] Itching and Rash  . Latex Itching and Rash  . Sulfa Antibiotics Itching and Rash    Social History   Socioeconomic History  . Marital status: Married    Spouse name: Arlethia Basso  . Number of children: 3  . Years of education: 71  . Highest education level: Not on file  Occupational History  . Occupation: IT [employer]  Tobacco Use  . Smoking status: Never Smoker  . Smokeless tobacco: Never Used  Substance and Sexual Activity  . Alcohol use: No    Comment: drinks daily - none since 08/2012 per pt.  . Drug use: No  . Sexual activity: Yes    Birth control/protection: Surgical  Other Topics Concern  . Not on file  Social History Narrative   Lives with spouse   Caffeine use: 2-3 cups per day   Social Determinants of Health   Financial Resource Strain:   . Difficulty of Paying Living Expenses: Not on file  Food Insecurity:   . Worried About Charity fundraiser in the Last Year: Not on file  . Ran Out of Food in the Last Year: Not on file  Transportation Needs:   . Lack of Transportation (Medical): Not on file  . Lack of Transportation (Non-Medical): Not on file  Physical Activity:   . Days of Exercise per Week: Not on file  . Minutes of Exercise per Session: Not on file  Stress:   . Feeling of Stress : Not on file  Social Connections:   . Frequency of Communication with Friends and Family: Not on file  . Frequency of Social Gatherings with Friends and Family: Not on file  . Attends Religious Services: Not on file  . Active Member of Clubs or Organizations: Not on file  . Attends Archivist Meetings: Not on file  . Marital Status: Not on file  Intimate Partner Violence:   . Fear of Current or Ex-Partner: Not on file  . Emotionally  Abused: Not on file  . Physically Abused: Not on file  . Sexually Abused: Not on file    Family History  Problem Relation Age of Onset  . Colon polyps Mother   . Heart disease Father 54       CABG  . Breast cancer Maternal Aunt   . Stomach cancer Maternal Grandmother   . Diabetes Maternal Grandmother   . Heart disease Maternal Grandmother   . Stomach cancer Maternal Uncle   . Colon cancer Maternal Uncle        x3  . Diabetes  Maternal Aunt        Type I  . Diabetes Maternal Uncle        x5 - Type II  . Heart disease Paternal Grandmother   . Esophageal cancer Neg Hx   . Gallbladder disease Neg Hx   . Seizures Neg Hx     ROS- All systems are reviewed and negative except as per the HPI above  Physical Exam: Vitals:   09/18/19 1539  BP: 134/78  Pulse: 81  Weight: (!) 143.6 kg  Height: _0  (1.676 m)   Wt Readings from Last 3 Encounters:  09/18/19 (!) 143.6 kg  08/09/19 (!) 142.9 kg  08/01/19 (!) 143.9 kg    Labs: Lab Results  Component Value Date   NA 140 08/02/2019   K 2.9 (L) 08/02/2019   CL 113 (H) 08/02/2019   CO2 21 (L) 08/02/2019   GLUCOSE 90 08/02/2019   BUN 6 08/02/2019   CREATININE 0.73 08/02/2019   CALCIUM 8.1 (L) 08/02/2019   MG 2.1 08/02/2019   Lab Results  Component Value Date   INR 1.1 07/30/2019   Lab Results  Component Value Date   CHOL 160 08/01/2019   HDL 47 08/01/2019   LDLCALC 98 08/01/2019   TRIG 77 08/01/2019     GEN- The patient is well appearing, alert and oriented x 3 today.   Head- normocephalic, atraumatic Eyes-  Sclera clear, conjunctiva pink Ears- hearing intact Oropharynx- clear Neck- supple, no JVP Lymph- no cervical lymphadenopathy Lungs- Clear to ausculation bilaterally, normal work of breathing Heart- Regular rate and rhythm, no murmurs, rubs or gallops, PMI not laterally displaced GI- soft, NT, ND, + BS Extremities- no clubbing, cyanosis, or edema MS- no significant deformity or atrophy Skin- no rash or  lesion Psych- euthymic mood, full affect Neuro- strength and sensation are intact  EKG-NSR, normal EKG   Zio patch-Patient had a min HR of 53 bpm, max HR of 250 bpm, and avg HR of 80 bpm. Predominant underlying rhythm was Sinus Rhythm. 1 run of Ventricular Tachycardia occurred lasting 5 beats with a max rate of 113 bpm (avg 109 bpm). Atrial Flutter occurred (7% burden), ranging from 69-250 bpm (avg of 113 bpm), the longest lasting 4 hours 43 mins with an avg rate of 118 bpm. Isolated SVEs were rare (<1.0%), SVE Couplets were rare (<1.0%), and SVE Triplets were rare (<1.0%). Isolated VEs were rare (<1.0%), and no VE Couplets or VE Triplets were present. MD notification criteria for Rapid Atrial Flutter met - report posted prior to notification per account request (MW).  Echo- IMPRESSIONS    1. Left ventricular ejection fraction, by visual estimation, is 60 to 65%. The left ventricle has normal function. Left ventricular septal wall thickness was mildly increased. Normal left ventricular posterior wall thickness. There is mildly increased  left ventricular hypertrophy.  2. Global right ventricle has normal systolic function.The right ventricular size is mildly enlarged. No increase in right ventricular wall thickness.  3. Left atrial size was moderately dilated.  4. Right atrial size was moderately dilated.  5. The mitral valve is normal in structure. No evidence of mitral valve regurgitation.  6. The tricuspid valve is normal in structure. Tricuspid valve regurgitation is trivial.  7. The aortic valve is normal in structure. Aortic valve regurgitation was not visualized by color flow Doppler.  8. The pulmonic valve was normal in structure. Pulmonic valve regurgitation is not visualized by color flow Doppler.  9. Mildly elevated pulmonary artery systolic  pressure. 10. The inferior vena cava is dilated in size with <50% respiratory variability, suggesting right atrial pressure of 15  mmHg.   Assessment and Plan: 1. New onset afib in the setting of Pneumonia  General education re afib/fluuter Afib noted in the hospital, atrial flutter on zio patch  Pt states mostly asymptomatic  She is already on cardizem 300 mg daily Will add metoprolol succinate 25 mg at hs, hopefully will help to temper aflutter episodes with blunting of v rates    2. Lifestyle issues  Will order sleep study with snoring and witnessed apnea  Reduce  caffeine intake Regular exercise and weight loss encouraged   3. CHA2DS2VASc score of 2 ( per hospital records for sex and probable undiagnosed HTN) Continue eliquis 5 mg bid for now   Will f/u with pt in one month  Butch Penny C. Jeannelle Wiens, [facility] [address] [place], [place] [address] [phone]

## 2019-09-20 ENCOUNTER — Telehealth: Payer: Self-pay | Admitting: *Deleted

## 2019-09-20 NOTE — Telephone Encounter (Signed)
Left message to return a call to discuss sleep study appointment details. 

## 2019-09-20 NOTE — Telephone Encounter (Signed)
Patient returned a call to me and was given sleep study and [place] appointment details. She voiced understanding of quarantine instructions.

## 2019-10-12 ENCOUNTER — Other Ambulatory Visit (HOSPITAL_COMMUNITY): Payer: Medicaid Other

## 2019-10-13 ENCOUNTER — Other Ambulatory Visit (HOSPITAL_COMMUNITY)
Admission: RE | Admit: 2019-10-13 | Discharge: 2019-10-13 | Disposition: A | Discharge status: Home / Self Care | Payer: Medicaid Other | Source: Ambulatory Visit | Attending: Cardiovascular Disease | Admitting: Cardiovascular Disease

## 2019-10-13 DIAGNOSIS — Z20822 Contact with and (suspected) exposure to covid-19: Secondary | ICD-10-CM | POA: Insufficient documentation

## 2019-10-13 DIAGNOSIS — Z01812 Encounter for preprocedural laboratory examination: Secondary | ICD-10-CM | POA: Insufficient documentation

## 2019-10-13 LAB — SARS CORONAVIRUS 2 (TAT 6-24 HRS): SARS Coronavirus 2: NEGATIVE

## 2019-10-15 ENCOUNTER — Ambulatory Visit (HOSPITAL_BASED_OUTPATIENT_CLINIC_OR_DEPARTMENT_OTHER): Payer: Self-pay | Attending: Nurse Practitioner | Admitting: Cardiovascular Disease

## 2019-10-15 ENCOUNTER — Other Ambulatory Visit: Payer: Self-pay

## 2019-10-15 DIAGNOSIS — G4733 Obstructive sleep apnea (adult) (pediatric): Secondary | ICD-10-CM

## 2019-10-15 DIAGNOSIS — R0683 Snoring: Secondary | ICD-10-CM | POA: Insufficient documentation

## 2019-10-15 DIAGNOSIS — I4891 Unspecified atrial fibrillation: Secondary | ICD-10-CM

## 2019-10-17 ENCOUNTER — Other Ambulatory Visit (HOSPITAL_BASED_OUTPATIENT_CLINIC_OR_DEPARTMENT_OTHER): Payer: Self-pay

## 2019-10-17 DIAGNOSIS — G4730 Sleep apnea, unspecified: Secondary | ICD-10-CM

## 2019-10-17 DIAGNOSIS — R0683 Snoring: Secondary | ICD-10-CM

## 2019-10-21 ENCOUNTER — Encounter (HOSPITAL_BASED_OUTPATIENT_CLINIC_OR_DEPARTMENT_OTHER): Payer: Self-pay | Admitting: Cardiovascular Disease

## 2019-10-21 NOTE — Procedures (Signed)
Patient Name: Tammy [name], Tammy [name] Date: 10/15/2019 Gender: Female D.O.B: 1961/09/04 Age (years): 58 Referring Provider: Sherran Needs Height (inches): 25 Interpreting Physician: Shelva Majestic MD, ABSM Weight (lbs): 306 RPSGT: Carolin Coy BMI: 40 MRN: [id number] Neck Size: 14.50  CLINICAL INFORMATION Sleep Study Type: NPSG  Indication for sleep study: Fatigue, Obesity, Snoring  Epworth Sleepiness Score: 3  SLEEP STUDY TECHNIQUE As per the AASM Manual for the Scoring of Sleep and Associated Events v2.3 (April 2016) with a hypopnea requiring 4% desaturations.  The channels recorded and monitored were frontal, central and occipital EEG, electrooculogram (EOG), submentalis EMG (chin), nasal and oral airflow, thoracic and abdominal wall motion, anterior tibialis EMG, snore microphone, electrocardiogram, and pulse oximetry.  MEDICATIONS acetaminophen (TYLENOL) 325 MG tablet  apixaban (ELIQUIS) 5 MG TABS tablet(Expired)  buPROPion (WELLBUTRIN XL) 300 MG 24 hr tablet  clonazePAM (KLONOPIN) 1 MG tablet  diclofenac Sodium (VOLTAREN) 1 % GEL  diltiazem (CARDIZEM CD) 300 MG 24 hr capsule(Expired)  FLUoxetine (PROZAC) 20 MG capsule  furosemide (LASIX) 20 MG tablet  gabapentin (NEURONTIN) 600 MG tablet  metoprolol succinate (TOPROL-XL) 25 MG 24 hr tablet  OLANZapine (ZYPREXA) 10 MG tablet  Vitamins/Minerals TABS   Medications self-administered by patient taken the night of the study : N/A  SLEEP ARCHITECTURE The study was initiated at 10:32:49 PM and ended at 4:45:02 AM.  Sleep onset time was 109.3 minutes and the sleep efficiency was 31.3%%. The total sleep time was 116.5 minutes.  Stage REM latency was N/A minutes.  The patient spent 6.4%% of the night in stage N1 sleep, 93.6%% in stage N2 sleep, 0.0%% in stage N3 and 0% in REM.  Alpha intrusion was absent.  Supine sleep was 0.00%.  RESPIRATORY PARAMETERS The overall apnea/hypopnea index (AHI) was 11.8 per  hour. The respiratory disturbance index (RDI) was severely abnormal at 51.0/h. There were 0 total apneas, including 0 obstructive, 0 central and 0 mixed apneas. There were 23 hypopneas and 76 RERAs.  The AHI during Stage REM sleep was N/A per hour.  AHI while supine was N/A per hour.  The mean oxygen saturation was 93.6%. The minimum SpO2 during sleep was 90.0%.  Loud snoring was noted during this study.  CARDIAC DATA The 2 lead EKG demonstrated sinus rhythm. The mean heart rate was 72.4 beats per minute. Other EKG findings include:PACs  LEG MOVEMENT DATA The total PLMS were 0 with a resulting PLMS index of 0.0. Associated arousal with leg movement index was 2.6 .  IMPRESSIONS - Mild obstructive sleep apnea overall (AHI 11.8/h); however, the respiratory disturbance index was severely abnormal (RDI 51.0/h) and REM sleep was absent. - No significant central sleep apnea occurred during this study (CAI = 0.0/h). - The patient had minimal or no oxygen desaturation during the study (Min O2 = 90.0%) - Abnormal sleep aprchitecture with absence of slow wave and REM sleep. - Poor sleep efficiency at only 31.3%. - The patient snored with loud snoring volume. - Cardiac abnormalities were noted during this study: PACs - Clinically significant periodic limb movements did not occur during sleep. No significant associated arousals.  DIAGNOSIS - Obstructive Sleep Apnea (327.23 [G47.33 ICD-10]) - Nocturnal Hypoxemia (327.26 [G47.36 ICD-10])  RECOMMENDATIONS - In this patient with significant cardiovascular comorbidities, recommend an in-lab therapeutic CPAP titration to determine optimal pressure required to alleviate sleep disordered breathing. - Effort should be made to optimize nasal and oropharyngeal patency. - Avoid alcohol, sedatives and other CNS depressants that may worsen sleep apnea and  disrupt normal sleep architecture. - Sleep hygiene should be reviewed to assess factors that may improve  sleep quality. - Weight management (BMI 49) and regular exercise should be initiated or continued if appropriate.  [Electronically signed] 10/21/2019 03:12 PM  Shelva Majestic MD, [facility], [place], American Board of Sleep Medicine   NPI: [id number] [place] PH: [phone]   FX: [phone] [place]

## 2019-10-22 ENCOUNTER — Other Ambulatory Visit: Payer: Self-pay

## 2019-10-22 ENCOUNTER — Ambulatory Visit (HOSPITAL_COMMUNITY)
Admission: RE | Admit: 2019-10-22 | Discharge: 2019-10-22 | Disposition: A | Discharge status: Home / Self Care | Payer: Medicaid Other | Source: Ambulatory Visit | Attending: Nurse Practitioner | Admitting: Nurse Practitioner

## 2019-10-22 ENCOUNTER — Encounter (HOSPITAL_COMMUNITY): Payer: Self-pay | Admitting: Nurse Practitioner

## 2019-10-22 VITALS — BP 150/94 | HR 93 | Ht 66.0 in | Wt 307.8 lb

## 2019-10-22 DIAGNOSIS — Z7901 Long term (current) use of anticoagulants: Secondary | ICD-10-CM | POA: Insufficient documentation

## 2019-10-22 DIAGNOSIS — E039 Hypothyroidism, unspecified: Secondary | ICD-10-CM | POA: Insufficient documentation

## 2019-10-22 DIAGNOSIS — I4892 Unspecified atrial flutter: Secondary | ICD-10-CM | POA: Insufficient documentation

## 2019-10-22 DIAGNOSIS — I480 Paroxysmal atrial fibrillation: Secondary | ICD-10-CM

## 2019-10-22 DIAGNOSIS — D6869 Other thrombophilia: Secondary | ICD-10-CM

## 2019-10-22 DIAGNOSIS — Z8249 Family history of ischemic heart disease and other diseases of the circulatory system: Secondary | ICD-10-CM | POA: Insufficient documentation

## 2019-10-22 DIAGNOSIS — Z79899 Other long term (current) drug therapy: Secondary | ICD-10-CM | POA: Insufficient documentation

## 2019-10-22 DIAGNOSIS — F419 Anxiety disorder, unspecified: Secondary | ICD-10-CM | POA: Insufficient documentation

## 2019-10-22 DIAGNOSIS — F329 Major depressive disorder, single episode, unspecified: Secondary | ICD-10-CM | POA: Insufficient documentation

## 2019-10-22 DIAGNOSIS — I4891 Unspecified atrial fibrillation: Secondary | ICD-10-CM | POA: Insufficient documentation

## 2019-10-22 DIAGNOSIS — J9601 Acute respiratory failure with hypoxia: Secondary | ICD-10-CM | POA: Insufficient documentation

## 2019-10-22 LAB — CBC
HCT: 35.5 % — ABNORMAL LOW (ref 36.0–46.0)
Hemoglobin: 10.4 g/dL — ABNORMAL LOW (ref 12.0–15.0)
MCH: 23.5 pg — ABNORMAL LOW (ref 26.0–34.0)
MCHC: 29.3 g/dL — ABNORMAL LOW (ref 30.0–36.0)
MCV: 80.3 fL (ref 80.0–100.0)
Platelets: 343 10*3/uL (ref 150–400)
RBC: 4.42 MIL/uL (ref 3.87–5.11)
RDW: 16.2 % — ABNORMAL HIGH (ref 11.5–15.5)
WBC: 6.8 10*3/uL (ref 4.0–10.5)
nRBC: 0 % (ref 0.0–0.2)

## 2019-10-22 LAB — BASIC METABOLIC PANEL
Anion gap: 12 (ref 5–15)
BUN: 5 mg/dL — ABNORMAL LOW (ref 6–20)
CO2: 23 mmol/L (ref 22–32)
Calcium: 8.8 mg/dL — ABNORMAL LOW (ref 8.9–10.3)
Chloride: 107 mmol/L (ref 98–111)
Creatinine, Ser: 0.75 mg/dL (ref 0.44–1.00)
GFR calc Af Amer: >60 mL/min (ref >60)
GFR calc non Af Amer: >60 mL/min (ref >60)
Glucose, Bld: 96 mg/dL (ref 70–99)
Potassium: 3 mmol/L — ABNORMAL LOW (ref 3.5–5.1)
Sodium: 142 mmol/L (ref 135–145)

## 2019-10-22 MED ORDER — FUROSEMIDE 20 MG PO TABS: 20.0000 mg | ORAL_TABLET | Freq: Two times a day (BID) | ORAL | 3 refills | Status: DC

## 2019-10-22 NOTE — Patient Instructions (Signed)
Lasix 20mg  twice a day

## 2019-10-22 NOTE — Progress Notes (Signed)
Primary Care Physician: Fanny Bien, MD Referring Physician: Dr. Nuala Alpha KALISE FICKETT is a 59 y.o. female with a h/o community acquired pneumonia with sepsis and acute hypoxic respiratory failure, 07/2019. She was found to have new onset atrial fibrillation. She was discharged on Cardizem and eliquis 5 mg bid for a CHA2DS2VASc score of 2.  She then had a zio patch placed with SR as predominate rhythm with 7% atrial flutter shown. She was referred to the afib clinic. Pt states that she snores with witnessed apnea. A sleep study was recommended at time of discharge. She also drinks 4 18 oz glasses of tea a day. No tobacco or alcohol use. Sedentary with a BMI of 51.10 kg. She does not really feel any arrhythmia's reported on the monitor.    F/u in afib clinic. 1/18. She  has been diagnosed with OSA and is pending cpap trial. She is  more short of breath over the last 1-2 weeks. This correlates with her running out of lasix about the same time. She  does not appreciate any significant  irregular rhythm. No bleeding issues with eliquis with a CHA2DS2VASc score of 2. She  is in SR  today but since she does not know when she is in SR or afib, for best protection against stroke with her total risk factors,  will continue eliquis.   Today, she denies symptoms of palpitations, chest pain, shortness of breath, orthopnea, PND, lower extremity edema, dizziness, presyncope, syncope, or neurologic sequela. The patient is tolerating medications without difficulties and is otherwise without complaint today.   Past Medical History:  Diagnosis Date  . Anxiety   . Arthritis    neck and lower back - deg disc disease, fingers  . Depression   . Frequency of urination   . GERD (gastroesophageal reflux disease)   . History of seizure per pt no seizure since    11-07-2015  in setting of stress and sleep deprivation  /  negative work-up by neurologist unknown idiology  . Hypothyroidism   . Injury of  thumb, left    03-31-2016    . Pelvic relaxation   . SUI (stress urinary incontinence, female)    Past Surgical History:  Procedure Laterality Date  . ANTERIOR AND POSTERIOR REPAIR  09/25/2012   Procedure: ANTERIOR (CYSTOCELE) AND POSTERIOR REPAIR (RECTOCELE);  Surgeon: Lahoma Crocker, MD;  Location: [facility];  Service: Gynecology;  Laterality: N/A;  . ANTERIOR AND POSTERIOR REPAIR WITH SACROSPINOUS FIXATION N/A 04/12/2016   Procedure: ANTERIOR AND POSTERIOR REPAIR WITH SACROSPINOUS LIGAMENT SUSPENSION, CYSTOSCOPY;  Surgeon: Arvella Nigh, MD;  Location: [facility];  Service: Gynecology;  Laterality: N/A;  . APPENDECTOMY  1987  . BREAST EXCISIONAL BIOPSY Left    No scar visable   . BREAST REDUCTION SURGERY  2000  aprrox  . ENDOMETRIAL ABLATION W/ NOVASURE  2011   in office  . EXCISION LEFT BREAST BX  12-07-1999   benign  . LAPAROSCOPIC CHOLECYSTECTOMY  02-09-2005  . LAPAROSCOPIC VAGINAL HYSTERECTOMY WITH SALPINGO OOPHORECTOMY Bilateral 04/12/2016   Procedure: LAPAROSCOPIC ASSISTED VAGINAL HYSTERECTOMY WITH SALPINGO OOPHORECTOMY;  Surgeon: Arvella Nigh, MD;  Location: [facility];  Service: Gynecology;  Laterality: Bilateral;  . REDUCTION MAMMAPLASTY Bilateral   . ROUX-EN-Y GASTRIC BYPASS  06/  2002  . TUBAL LIGATION  10/ 1990    Current Outpatient Medications  Medication Sig Dispense Refill  . acetaminophen (TYLENOL) 325 MG tablet Take 650 mg by mouth every 6 (  six) hours as needed for mild pain or fever.    Marland Kitchen apixaban (ELIQUIS) 5 MG TABS tablet Take 1 tablet (5 mg total) by mouth 2 (two) times daily. 180 tablet 1  . buPROPion (WELLBUTRIN XL) 300 MG 24 hr tablet Take 300 mg by mouth daily.   10  . clonazePAM (KLONOPIN) 1 MG tablet Take 1 mg by mouth 4 (four) times daily as needed for anxiety.     . diclofenac Sodium (VOLTAREN) 1 % GEL APPLY SPARINGLY TO AFFECTED AREA(S) ONCE DAILY EQUIVALENT TO DICLOFENAC    GEL 1%    . diltiazem (CARDIZEM CD) 300 MG 24 hr  capsule Take 1 capsule (300 mg total) by mouth daily. 90 capsule 1  . FLUoxetine (PROZAC) 20 MG capsule Take 20 mg by mouth at bedtime.   11  . furosemide (LASIX) 20 MG tablet Take 2 tablets (40 mg total) by mouth 2 (two) times daily.    Marland Kitchen gabapentin (NEURONTIN) 600 MG tablet Take 1,200 mg by mouth as needed.   11  . metoprolol succinate (TOPROL-XL) 25 MG 24 hr tablet Take 1 tablet (25 mg total) by mouth at bedtime. 30 tablet 2  . OLANZapine (ZYPREXA) 10 MG tablet Take 10 mg by mouth at bedtime.     No current facility-administered medications for this encounter.    Allergies  Allergen Reactions  . Nsaids Other (See Comments)    Post Gastric Bypass  . Adhesive [Tape] Itching and Rash  . Latex Itching and Rash  . Sulfa Antibiotics Itching and Rash    Social History   Socioeconomic History  . Marital status: Married    Spouse name: Saniyyah Elster  . Number of children: 3  . Years of education: 67  . Highest education level: Not on file  Occupational History  . Occupation: [employer]  Tobacco Use  . Smoking status: Never Smoker  . Smokeless tobacco: Never Used  Substance and Sexual Activity  . Alcohol use: No    Comment: drinks daily - none since 08/2012 per pt.  . Drug use: No  . Sexual activity: Yes    Birth control/protection: Surgical  Other Topics Concern  . Not on file  Social History Narrative   Lives with spouse   Caffeine use: 2-3 cups per day   Social Determinants of Health   Financial Resource Strain:   . Difficulty of Paying Living Expenses: Not on file  Food Insecurity:   . Worried About Charity fundraiser in the Last Year: Not on file  . Ran Out of Food in the Last Year: Not on file  Transportation Needs:   . Lack of Transportation (Medical): Not on file  . Lack of Transportation (Non-Medical): Not on file  Physical Activity:   . Days of Exercise per Week: Not on file  . Minutes of Exercise per Session: Not on file  Stress:   . Feeling of Stress :  Not on file  Social Connections:   . Frequency of Communication with Friends and Family: Not on file  . Frequency of Social Gatherings with Friends and Family: Not on file  . Attends Religious Services: Not on file  . Active Member of Clubs or Organizations: Not on file  . Attends Archivist Meetings: Not on file  . Marital Status: Not on file  Intimate Partner Violence:   . Fear of Current or Ex-Partner: Not on file  . Emotionally Abused: Not on file  . Physically Abused: Not on  file  . Sexually Abused: Not on file    Family History  Problem Relation Age of Onset  . Colon polyps Mother   . Heart disease Father 85       CABG  . Breast cancer Maternal Aunt   . Stomach cancer Maternal Grandmother   . Diabetes Maternal Grandmother   . Heart disease Maternal Grandmother   . Stomach cancer Maternal Uncle   . Colon cancer Maternal Uncle        x3  . Diabetes Maternal Aunt        Type I  . Diabetes Maternal Uncle        x5 - Type II  . Heart disease Paternal Grandmother   . Esophageal cancer Neg Hx   . Gallbladder disease Neg Hx   . Seizures Neg Hx     ROS- All systems are reviewed and negative except as per the HPI above  Physical Exam: Vitals:   10/22/19 1511  BP: (!) 150/94  Pulse: 93  Weight: (!) 139.6 kg  Height: 5' 6" (1.676 m)   Wt Readings from Last 3 Encounters:  10/22/19 (!) 139.6 kg  10/15/19 (!) 138.8 kg  09/18/19 (!) 143.6 kg    Labs: Lab Results  Component Value Date   NA 140 08/02/2019   K 2.9 (L) 08/02/2019   CL 113 (H) 08/02/2019   CO2 21 (L) 08/02/2019   GLUCOSE 90 08/02/2019   BUN 6 08/02/2019   CREATININE 0.73 08/02/2019   CALCIUM 8.1 (L) 08/02/2019   MG 2.1 08/02/2019   Lab Results  Component Value Date   INR 1.1 07/30/2019   Lab Results  Component Value Date   CHOL 160 08/01/2019   HDL 47 08/01/2019   LDLCALC 98 08/01/2019   TRIG 77 08/01/2019     GEN- The patient is well appearing, alert and oriented x 3 today.     Head- normocephalic, atraumatic Eyes-  Sclera clear, conjunctiva pink Ears- hearing intact Oropharynx- clear Neck- supple, no JVP Lymph- no cervical lymphadenopathy Lungs- Clear to ausculation bilaterally, normal work of breathing Heart- Regular rate and rhythm, no murmurs, rubs or gallops, PMI not laterally displaced GI- soft, NT, ND, + BS Extremities- no clubbing, cyanosis, or edema MS- no significant deformity or atrophy Skin- no rash or lesion Psych- euthymic mood, full affect Neuro- strength and sensation are intact  EKG-NSR, with PAC's   Zio patch-Patient had a min HR of 53 bpm, max HR of 250 bpm, and avg HR of 80 bpm. Predominant underlying rhythm was Sinus Rhythm. 1 run of Ventricular Tachycardia occurred lasting 5 beats with a max rate of 113 bpm (avg 109 bpm). Atrial Flutter occurred (7% burden), ranging from 69-250 bpm (avg of 113 bpm), the longest lasting 4 hours 43 mins with an avg rate of 118 bpm. Isolated SVEs were rare (<1.0%), SVE Couplets were rare (<1.0%), and SVE Triplets were rare (<1.0%). Isolated VEs were rare (<1.0%), and no VE Couplets or VE Triplets were present. MD notification criteria for Rapid Atrial Flutter met - report posted prior to notification per account request (MW).  Echo- IMPRESSIONS    1. Left ventricular ejection fraction, by visual estimation, is 60 to 65%. The left ventricle has normal function. Left ventricular septal wall thickness was mildly increased. Normal left ventricular posterior wall thickness. There is mildly increased  left ventricular hypertrophy.  2. Global right ventricle has normal systolic function.The right ventricular size is mildly enlarged. No increase in right ventricular wall  thickness.  3. Left atrial size was moderately dilated.  4. Right atrial size was moderately dilated.  5. The mitral valve is normal in structure. No evidence of mitral valve regurgitation.  6. The tricuspid valve is normal in structure.  Tricuspid valve regurgitation is trivial.  7. The aortic valve is normal in structure. Aortic valve regurgitation was not visualized by color flow Doppler.  8. The pulmonic valve was normal in structure. Pulmonic valve regurgitation is not visualized by color flow Doppler.  9. Mildly elevated pulmonary artery systolic pressure. 10. The inferior vena cava is dilated in size with <50% respiratory variability, suggesting right atrial pressure of 15 mmHg.   Assessment and Plan: 1. New onset afib in the setting of Pneumonia  General education re afib/fluuter Afib noted in the hospital, atrial flutter on zio patch  Pt states mostly asymptomatic  She is already on cardizem 300 mg daily Continue metoprolol succinate 25 mg at hs, hopefully will help to temper aflutter episodes with blunting of v rates   Will refill lasix at 20 mg bid  Cbc/bmet today   2. Lifestyle issues  Recent  sleep study positive  Pending cpap trail  Reduce  caffeine intake Regular exercise and weight loss encouraged   3. CHA2DS2VASc score of 2 ( per hospital records for sex and probable undiagnosed HTN) Continue eliquis 5 mg bid for now   Will f/u with pt in 3 months   Butch Penny C. Lilliane Sposito, [facility] [address] [place], [place] [address] [phone]

## 2019-10-23 ENCOUNTER — Other Ambulatory Visit (HOSPITAL_COMMUNITY): Payer: Self-pay | Admitting: *Deleted

## 2019-10-23 MED ORDER — POTASSIUM CHLORIDE CRYS ER 20 MEQ PO TBCR: EXTENDED_RELEASE_TABLET | ORAL | 0 refills | Status: DC

## 2019-10-24 ENCOUNTER — Telehealth: Payer: Self-pay | Admitting: *Deleted

## 2019-10-24 ENCOUNTER — Other Ambulatory Visit: Payer: Self-pay | Admitting: Cardiovascular Disease

## 2019-10-24 DIAGNOSIS — G4733 Obstructive sleep apnea (adult) (pediatric): Secondary | ICD-10-CM

## 2019-10-24 DIAGNOSIS — I4891 Unspecified atrial fibrillation: Secondary | ICD-10-CM

## 2019-10-24 NOTE — Telephone Encounter (Signed)
Left message to return a call to discuss sleep study results. 

## 2019-10-26 ENCOUNTER — Emergency Department (HOSPITAL_COMMUNITY): Payer: 59

## 2019-10-26 ENCOUNTER — Other Ambulatory Visit: Payer: Self-pay

## 2019-10-26 ENCOUNTER — Encounter (HOSPITAL_COMMUNITY): Payer: Self-pay

## 2019-10-26 ENCOUNTER — Inpatient Hospital Stay (HOSPITAL_COMMUNITY)
Admission: EM | Admit: 2019-10-26 | Discharge: 2019-10-28 | DRG: 092 | Disposition: A | Discharge status: Home / Self Care | Payer: 59 | Attending: Internal Medicine | Admitting: Internal Medicine

## 2019-10-26 DIAGNOSIS — R531 Weakness: Secondary | ICD-10-CM

## 2019-10-26 DIAGNOSIS — Z833 Family history of diabetes mellitus: Secondary | ICD-10-CM

## 2019-10-26 DIAGNOSIS — Z8249 Family history of ischemic heart disease and other diseases of the circulatory system: Secondary | ICD-10-CM

## 2019-10-26 DIAGNOSIS — N390 Urinary tract infection, site not specified: Secondary | ICD-10-CM | POA: Diagnosis present

## 2019-10-26 DIAGNOSIS — Z888 Allergy status to other drugs, medicaments and biological substances status: Secondary | ICD-10-CM

## 2019-10-26 DIAGNOSIS — F329 Major depressive disorder, single episode, unspecified: Secondary | ICD-10-CM | POA: Diagnosis present

## 2019-10-26 DIAGNOSIS — Z803 Family history of malignant neoplasm of breast: Secondary | ICD-10-CM

## 2019-10-26 DIAGNOSIS — N393 Stress incontinence (female) (male): Secondary | ICD-10-CM | POA: Diagnosis present

## 2019-10-26 DIAGNOSIS — R509 Fever, unspecified: Secondary | ICD-10-CM

## 2019-10-26 DIAGNOSIS — Z20822 Contact with and (suspected) exposure to covid-19: Secondary | ICD-10-CM | POA: Diagnosis present

## 2019-10-26 DIAGNOSIS — G9340 Encephalopathy, unspecified: Secondary | ICD-10-CM | POA: Diagnosis present

## 2019-10-26 DIAGNOSIS — Z79899 Other long term (current) drug therapy: Secondary | ICD-10-CM

## 2019-10-26 DIAGNOSIS — I480 Paroxysmal atrial fibrillation: Secondary | ICD-10-CM | POA: Diagnosis present

## 2019-10-26 DIAGNOSIS — Z800 Family history of malignant neoplasm of digestive organs: Secondary | ICD-10-CM

## 2019-10-26 DIAGNOSIS — Z7901 Long term (current) use of anticoagulants: Secondary | ICD-10-CM

## 2019-10-26 DIAGNOSIS — G92 Toxic encephalopathy: Secondary | ICD-10-CM | POA: Diagnosis not present

## 2019-10-26 DIAGNOSIS — E869 Volume depletion, unspecified: Secondary | ICD-10-CM | POA: Diagnosis present

## 2019-10-26 DIAGNOSIS — Z91040 Latex allergy status: Secondary | ICD-10-CM

## 2019-10-26 DIAGNOSIS — E662 Morbid (severe) obesity with alveolar hypoventilation: Secondary | ICD-10-CM | POA: Diagnosis present

## 2019-10-26 DIAGNOSIS — Z882 Allergy status to sulfonamides status: Secondary | ICD-10-CM

## 2019-10-26 DIAGNOSIS — F419 Anxiety disorder, unspecified: Secondary | ICD-10-CM | POA: Diagnosis present

## 2019-10-26 DIAGNOSIS — G894 Chronic pain syndrome: Secondary | ICD-10-CM | POA: Diagnosis present

## 2019-10-26 DIAGNOSIS — Z6841 Body Mass Index (BMI) 40.0 and over, adult: Secondary | ICD-10-CM

## 2019-10-26 LAB — BRAIN NATRIURETIC PEPTIDE: B Natriuretic Peptide: 89 pg/mL (ref 0.0–100.0)

## 2019-10-26 LAB — CBC WITH DIFFERENTIAL/PLATELET
Abs Immature Granulocytes: 0.03 10*3/uL (ref 0.00–0.07)
Basophils Absolute: 0 10*3/uL (ref 0.0–0.1)
Basophils Relative: 0 %
Eosinophils Absolute: 0.1 10*3/uL (ref 0.0–0.5)
Eosinophils Relative: 1 %
HCT: 32.6 % — ABNORMAL LOW (ref 36.0–46.0)
Hemoglobin: 9.7 g/dL — ABNORMAL LOW (ref 12.0–15.0)
Immature Granulocytes: 0 %
Lymphocytes Relative: 13 %
Lymphs Abs: 1.4 10*3/uL (ref 0.7–4.0)
MCH: 23.7 pg — ABNORMAL LOW (ref 26.0–34.0)
MCHC: 29.8 g/dL — ABNORMAL LOW (ref 30.0–36.0)
MCV: 79.5 fL — ABNORMAL LOW (ref 80.0–100.0)
Monocytes Absolute: 0.7 10*3/uL (ref 0.1–1.0)
Monocytes Relative: 7 %
Neutro Abs: 9 10*3/uL — ABNORMAL HIGH (ref 1.7–7.7)
Neutrophils Relative %: 79 %
Platelets: 309 10*3/uL (ref 150–400)
RBC: 4.1 MIL/uL (ref 3.87–5.11)
RDW: 16.3 % — ABNORMAL HIGH (ref 11.5–15.5)
WBC: 11.3 10*3/uL — ABNORMAL HIGH (ref 4.0–10.5)
nRBC: 0 % (ref 0.0–0.2)

## 2019-10-26 LAB — COMPREHENSIVE METABOLIC PANEL
ALT: 14 U/L (ref 0–44)
AST: 18 U/L (ref 15–41)
Albumin: 3.7 g/dL (ref 3.5–5.0)
Alkaline Phosphatase: 113 U/L (ref 38–126)
Anion gap: 9 (ref 5–15)
BUN: 14 mg/dL (ref 6–20)
CO2: 27 mmol/L (ref 22–32)
Calcium: 8.8 mg/dL — ABNORMAL LOW (ref 8.9–10.3)
Chloride: 102 mmol/L (ref 98–111)
Creatinine, Ser: 0.99 mg/dL (ref 0.44–1.00)
GFR calc Af Amer: >60 mL/min (ref >60)
GFR calc non Af Amer: >60 mL/min (ref >60)
Glucose, Bld: 104 mg/dL — ABNORMAL HIGH (ref 70–99)
Potassium: 3.9 mmol/L (ref 3.5–5.1)
Sodium: 138 mmol/L (ref 135–145)
Total Bilirubin: 0.4 mg/dL (ref 0.3–1.2)
Total Protein: 7.1 g/dL (ref 6.5–8.1)

## 2019-10-26 LAB — RESPIRATORY PANEL BY RT PCR (FLU A&B, COVID)
Influenza A by PCR: NEGATIVE
Influenza B by PCR: NEGATIVE
SARS Coronavirus 2 by RT PCR: NEGATIVE

## 2019-10-26 LAB — BLOOD GAS, ARTERIAL
Acid-Base Excess: 2.1 mmol/L — ABNORMAL HIGH (ref 0.0–2.0)
Bicarbonate: 27.6 mmol/L (ref 20.0–28.0)
O2 Saturation: 89.1 %
Patient temperature: 100.6
pCO2 arterial: 53.9 mmHg — ABNORMAL HIGH (ref 32.0–48.0)
pH, Arterial: 7.337 — ABNORMAL LOW (ref 7.350–7.450)
pO2, Arterial: 68.2 mmHg — ABNORMAL LOW (ref 83.0–108.0)

## 2019-10-26 LAB — LACTIC ACID, PLASMA: Lactic Acid, Venous: 2.1 mmol/L (ref 0.5–1.9)

## 2019-10-26 LAB — TROPONIN I (HIGH SENSITIVITY): Troponin I (High Sensitivity): 5 ng/L (ref <18)

## 2019-10-26 MED ORDER — SODIUM CHLORIDE 0.9 % IV BOLUS
500.0000 mL | Freq: Once | INTRAVENOUS | Status: AC
  Administered 2019-10-27: 500 mL via INTRAVENOUS

## 2019-10-26 MED ORDER — SODIUM CHLORIDE 0.9 % IV SOLN
500.0000 mg | Freq: Once | INTRAVENOUS | Status: AC
  Administered 2019-10-27: 500 mg via INTRAVENOUS
  Filled 2019-10-26: qty 500

## 2019-10-26 MED ORDER — ACETAMINOPHEN 500 MG PO TABS: 1000.0000 mg | ORAL_TABLET | Freq: Once | ORAL | Status: DC

## 2019-10-26 MED ORDER — SODIUM CHLORIDE 0.9 % IV SOLN
1.0000 g | Freq: Once | INTRAVENOUS | Status: AC
  Administered 2019-10-27: 1 g via INTRAVENOUS
  Filled 2019-10-26: qty 10

## 2019-10-26 NOTE — ED Notes (Signed)
Pt placed on a purewick at this time. States she will notify staff when able to void.

## 2019-10-26 NOTE — ED Triage Notes (Signed)
Pt BIB [facility] from home reporting generalized weakness x 3 days. EMS found her on the floor, she denies a fall, but states that she lowered herself to the floor and couldn't get back up. She states that she was in the floor for 4 hours. A&Ox4, but slow to respond. She denies known COVID exposure and reports a negative test 2 weeks ago. Fever of 102.7 with EMS, given 1000mg  of tylenol and 4 mg of zofran en route.

## 2019-10-26 NOTE — ED Notes (Signed)
Pt has not voided at this time, pt encouraged to void for specimen.

## 2019-10-27 ENCOUNTER — Emergency Department (HOSPITAL_COMMUNITY): Payer: 59

## 2019-10-27 ENCOUNTER — Other Ambulatory Visit: Payer: Self-pay

## 2019-10-27 DIAGNOSIS — I480 Paroxysmal atrial fibrillation: Secondary | ICD-10-CM | POA: Diagnosis present

## 2019-10-27 DIAGNOSIS — Z20822 Contact with and (suspected) exposure to covid-19: Secondary | ICD-10-CM | POA: Diagnosis present

## 2019-10-27 DIAGNOSIS — G9340 Encephalopathy, unspecified: Secondary | ICD-10-CM | POA: Diagnosis present

## 2019-10-27 DIAGNOSIS — E869 Volume depletion, unspecified: Secondary | ICD-10-CM | POA: Diagnosis present

## 2019-10-27 DIAGNOSIS — R531 Weakness: Secondary | ICD-10-CM | POA: Diagnosis present

## 2019-10-27 DIAGNOSIS — Z6841 Body Mass Index (BMI) 40.0 and over, adult: Secondary | ICD-10-CM | POA: Diagnosis not present

## 2019-10-27 DIAGNOSIS — Z800 Family history of malignant neoplasm of digestive organs: Secondary | ICD-10-CM | POA: Diagnosis not present

## 2019-10-27 DIAGNOSIS — Z882 Allergy status to sulfonamides status: Secondary | ICD-10-CM | POA: Diagnosis not present

## 2019-10-27 DIAGNOSIS — N393 Stress incontinence (female) (male): Secondary | ICD-10-CM | POA: Diagnosis present

## 2019-10-27 DIAGNOSIS — F329 Major depressive disorder, single episode, unspecified: Secondary | ICD-10-CM | POA: Diagnosis present

## 2019-10-27 DIAGNOSIS — Z7901 Long term (current) use of anticoagulants: Secondary | ICD-10-CM | POA: Diagnosis not present

## 2019-10-27 DIAGNOSIS — Z8249 Family history of ischemic heart disease and other diseases of the circulatory system: Secondary | ICD-10-CM | POA: Diagnosis not present

## 2019-10-27 DIAGNOSIS — Z888 Allergy status to other drugs, medicaments and biological substances status: Secondary | ICD-10-CM | POA: Diagnosis not present

## 2019-10-27 DIAGNOSIS — Z803 Family history of malignant neoplasm of breast: Secondary | ICD-10-CM | POA: Diagnosis not present

## 2019-10-27 DIAGNOSIS — Z833 Family history of diabetes mellitus: Secondary | ICD-10-CM | POA: Diagnosis not present

## 2019-10-27 DIAGNOSIS — Z79899 Other long term (current) drug therapy: Secondary | ICD-10-CM | POA: Diagnosis not present

## 2019-10-27 DIAGNOSIS — N390 Urinary tract infection, site not specified: Secondary | ICD-10-CM | POA: Diagnosis present

## 2019-10-27 DIAGNOSIS — E662 Morbid (severe) obesity with alveolar hypoventilation: Secondary | ICD-10-CM | POA: Diagnosis present

## 2019-10-27 DIAGNOSIS — Z91040 Latex allergy status: Secondary | ICD-10-CM | POA: Diagnosis not present

## 2019-10-27 DIAGNOSIS — G894 Chronic pain syndrome: Secondary | ICD-10-CM | POA: Diagnosis present

## 2019-10-27 DIAGNOSIS — G92 Toxic encephalopathy: Secondary | ICD-10-CM | POA: Diagnosis present

## 2019-10-27 DIAGNOSIS — F419 Anxiety disorder, unspecified: Secondary | ICD-10-CM | POA: Diagnosis present

## 2019-10-27 LAB — AMMONIA: Ammonia: 17 umol/L (ref 9–35)

## 2019-10-27 LAB — URINALYSIS, ROUTINE W REFLEX MICROSCOPIC
Bilirubin Urine: NEGATIVE
Glucose, UA: NEGATIVE mg/dL
Ketones, ur: NEGATIVE mg/dL
Nitrite: POSITIVE — AB
Protein, ur: NEGATIVE mg/dL
Specific Gravity, Urine: 1.013 (ref 1.005–1.030)
pH: 5 (ref 5.0–8.0)

## 2019-10-27 LAB — BASIC METABOLIC PANEL
Anion gap: 8 (ref 5–15)
BUN: 12 mg/dL (ref 6–20)
CO2: 28 mmol/L (ref 22–32)
Calcium: 8.4 mg/dL — ABNORMAL LOW (ref 8.9–10.3)
Chloride: 102 mmol/L (ref 98–111)
Creatinine, Ser: 0.81 mg/dL (ref 0.44–1.00)
GFR calc Af Amer: >60 mL/min (ref >60)
GFR calc non Af Amer: >60 mL/min (ref >60)
Glucose, Bld: 113 mg/dL — ABNORMAL HIGH (ref 70–99)
Potassium: 3.6 mmol/L (ref 3.5–5.1)
Sodium: 138 mmol/L (ref 135–145)

## 2019-10-27 LAB — TROPONIN I (HIGH SENSITIVITY): Troponin I (High Sensitivity): 6 ng/L (ref <18)

## 2019-10-27 LAB — RAPID URINE DRUG SCREEN, HOSP PERFORMED
Amphetamines: NOT DETECTED
Barbiturates: NOT DETECTED
Benzodiazepines: POSITIVE — AB
Cocaine: NOT DETECTED
Opiates: POSITIVE — AB
Tetrahydrocannabinol: NOT DETECTED

## 2019-10-27 LAB — LACTIC ACID, PLASMA: Lactic Acid, Venous: 0.9 mmol/L (ref 0.5–1.9)

## 2019-10-27 LAB — CBC
HCT: 31.5 % — ABNORMAL LOW (ref 36.0–46.0)
Hemoglobin: 9 g/dL — ABNORMAL LOW (ref 12.0–15.0)
MCH: 23.1 pg — ABNORMAL LOW (ref 26.0–34.0)
MCHC: 28.6 g/dL — ABNORMAL LOW (ref 30.0–36.0)
MCV: 80.8 fL (ref 80.0–100.0)
Platelets: 273 10*3/uL (ref 150–400)
RBC: 3.9 MIL/uL (ref 3.87–5.11)
RDW: 16.3 % — ABNORMAL HIGH (ref 11.5–15.5)
WBC: 8.4 10*3/uL (ref 4.0–10.5)
nRBC: 0 % (ref 0.0–0.2)

## 2019-10-27 MED ORDER — SODIUM CHLORIDE 0.9 % IV SOLN
1.0000 g | INTRAVENOUS | Status: DC
  Administered 2019-10-27 – 2019-10-28 (×2): 1 g via INTRAVENOUS
  Filled 2019-10-27: qty 1
  Filled 2019-10-27: qty 10

## 2019-10-27 MED ORDER — BISACODYL 5 MG PO TBEC: 5.0000 mg | DELAYED_RELEASE_TABLET | Freq: Every day | ORAL | Status: DC | PRN

## 2019-10-27 MED ORDER — CLONAZEPAM 0.5 MG PO TABS
0.2500 mg | ORAL_TABLET | Freq: Three times a day (TID) | ORAL | Status: DC
  Administered 2019-10-27 – 2019-10-28 (×3): 0.25 mg via ORAL
  Filled 2019-10-27 (×3): qty 1

## 2019-10-27 MED ORDER — APIXABAN 5 MG PO TABS
5.0000 mg | ORAL_TABLET | Freq: Two times a day (BID) | ORAL | Status: DC
  Administered 2019-10-27 – 2019-10-28 (×4): 5 mg via ORAL
  Filled 2019-10-27 (×4): qty 1

## 2019-10-27 MED ORDER — DILTIAZEM HCL ER COATED BEADS 180 MG PO CP24
300.0000 mg | ORAL_CAPSULE | Freq: Every day | ORAL | Status: DC
  Administered 2019-10-27 – 2019-10-28 (×2): 300 mg via ORAL
  Filled 2019-10-27 (×2): qty 1

## 2019-10-27 MED ORDER — ACETAMINOPHEN 325 MG PO TABS
650.0000 mg | ORAL_TABLET | Freq: Four times a day (QID) | ORAL | Status: DC | PRN
  Administered 2019-10-27 (×2): 650 mg via ORAL
  Filled 2019-10-27 (×2): qty 2

## 2019-10-27 MED ORDER — METOPROLOL SUCCINATE ER 25 MG PO TB24
25.0000 mg | ORAL_TABLET | Freq: Every day | ORAL | Status: DC
  Administered 2019-10-27: 25 mg via ORAL
  Filled 2019-10-27: qty 1

## 2019-10-27 MED ORDER — GABAPENTIN 300 MG PO CAPS
300.0000 mg | ORAL_CAPSULE | Freq: Two times a day (BID) | ORAL | Status: DC
  Administered 2019-10-28: 300 mg via ORAL
  Filled 2019-10-27: qty 1

## 2019-10-27 MED ORDER — BUPROPION HCL ER (XL) 300 MG PO TB24
300.0000 mg | ORAL_TABLET | Freq: Every day | ORAL | Status: DC
  Administered 2019-10-27 – 2019-10-28 (×2): 300 mg via ORAL
  Filled 2019-10-27 (×2): qty 1

## 2019-10-27 MED ORDER — CLONAZEPAM 0.5 MG PO TABS: 0.2500 mg | ORAL_TABLET | Freq: Three times a day (TID) | ORAL | Status: DC | PRN

## 2019-10-27 MED ORDER — SODIUM CHLORIDE 0.9 % IV SOLN: INTRAVENOUS | Status: DC

## 2019-10-27 MED ORDER — DICLOFENAC SODIUM 1 % EX GEL: 2.0000 g | Freq: Every day | CUTANEOUS | Status: DC | PRN

## 2019-10-27 MED ORDER — FLUOXETINE HCL 20 MG PO CAPS
20.0000 mg | ORAL_CAPSULE | Freq: Every day | ORAL | Status: DC
  Administered 2019-10-27: 20 mg via ORAL
  Filled 2019-10-27: qty 1

## 2019-10-27 MED ORDER — ALBUTEROL SULFATE (2.5 MG/3ML) 0.083% IN NEBU: 2.5000 mg | INHALATION_SOLUTION | RESPIRATORY_TRACT | Status: DC | PRN

## 2019-10-27 NOTE — Plan of Care (Signed)
Pt admitted due to recent fall. Able to recall event when asked. Alert and oriented x 4. C/o headache 4/5 this AM tylenol was given and was effective. Educated to use call light before attempting to get out of bed. Verbalized understanding and able to do return demonstration on how to call nurse for assistance. Pt restarted IV fluids and was concerned as to why she was not discharged today. Went over current care plan and updated pt on next steps with possible discharge for tomorrow according to MD note. Will continue to monitor

## 2019-10-27 NOTE — ED Notes (Signed)
Pt's belongings moved to 5-east with patient.  Patient's belongings include: -shoes -x2 shirts -jacket  -pants -glasses -cell phone

## 2019-10-27 NOTE — ED Provider Notes (Signed)
[facility] DEPT Provider Note   CSN: [id number] Arrival date & time: 10/26/19  2005     History Chief Complaint  Patient presents with  . Weakness    Tammy [name] is a 59 y.o. female.  59 year old female with prior medical history as detailed below presents for evaluation of weakness, fatigue, fever.  Symptoms began over the last 48 hours.  Apparently the patient was unable to ambulate today secondary to her weakness.  She denies chest pain.  She is mildly lethargic on initial examination. However, with stimulation and she answers all questions appropriately.  The history is provided by the patient and medical records.  Weakness Severity:  Moderate Onset quality:  Gradual Duration:  2 days Timing:  Constant Progression:  Worsening Chronicity:  New Relieved by:  Nothing Worsened by:  Nothing      Past Medical History:  Diagnosis Date  . Anxiety   . Arthritis    neck and lower back - deg disc disease, fingers  . Depression   . Frequency of urination   . GERD (gastroesophageal reflux disease)   . History of seizure per pt no seizure since    11-07-2015  in setting of stress and sleep deprivation  /  negative work-up by neurologist unknown idiology  . Hypothyroidism   . Injury of thumb, left    03-31-2016    . Pelvic relaxation   . SUI (stress urinary incontinence, female)     Patient Active Problem List   Diagnosis Date Noted  . CAP (community acquired pneumonia) 07/31/2019  . Atrial fibrillation with RVR ([place]) 07/31/2019  . Morbid obesity with BMI of 50.0-59.9, adult ([place]) 07/31/2019  . Community acquired pneumonia 04/14/2019  . Closed nondisplaced fracture of head of left radius 06/20/2018  . Sprain of left wrist 06/20/2018  . Peroneal tendinitis, right leg 06/23/2017  . Trochanteric bursitis, right hip 06/23/2017  . Acute right-sided low back pain with left-sided sciatica 03/24/2017  . Pain in right hip 03/24/2017  .  Impingement syndrome of left shoulder 08/30/2016  . Impingement syndrome of right shoulder 08/30/2016  . Plantar fasciitis, bilateral 08/30/2016  . Pelvic relaxation 04/13/2016    Class: Present on Admission  . S/P hysterectomy 04/12/2016  . Depression 11/16/2015  . Generalized anxiety disorder 11/16/2015  . Convulsion ([place]) 11/16/2015  . SUI (stress urinary incontinence, female) 09/26/2012    Past Surgical History:  Procedure Laterality Date  . ANTERIOR AND POSTERIOR REPAIR  09/25/2012   Procedure: ANTERIOR (CYSTOCELE) AND POSTERIOR REPAIR (RECTOCELE);  Surgeon: Lahoma Crocker, MD;  Location: [facility];  Service: Gynecology;  Laterality: N/A;  . ANTERIOR AND POSTERIOR REPAIR WITH SACROSPINOUS FIXATION N/A 04/12/2016   Procedure: ANTERIOR AND POSTERIOR REPAIR WITH SACROSPINOUS LIGAMENT SUSPENSION, CYSTOSCOPY;  Surgeon: Arvella Nigh, MD;  Location: [facility];  Service: Gynecology;  Laterality: N/A;  . APPENDECTOMY  1987  . BREAST EXCISIONAL BIOPSY Left    No scar visable   . BREAST REDUCTION SURGERY  2000  aprrox  . ENDOMETRIAL ABLATION W/ NOVASURE  2011   in office  . EXCISION LEFT BREAST BX  12-07-1999   benign  . LAPAROSCOPIC CHOLECYSTECTOMY  02-09-2005  . LAPAROSCOPIC VAGINAL HYSTERECTOMY WITH SALPINGO OOPHORECTOMY Bilateral 04/12/2016   Procedure: LAPAROSCOPIC ASSISTED VAGINAL HYSTERECTOMY WITH SALPINGO OOPHORECTOMY;  Surgeon: Arvella Nigh, MD;  Location: [facility];  Service: Gynecology;  Laterality: Bilateral;  . REDUCTION MAMMAPLASTY Bilateral   . ROUX-EN-Y GASTRIC BYPASS  06/  2002  .  TUBAL LIGATION  10/ 1990     OB History   No obstetric history on file.     Family History  Problem Relation Age of Onset  . Colon polyps Mother   . Heart disease Father 50       CABG  . Breast cancer Maternal Aunt   . Stomach cancer Maternal Grandmother   . Diabetes Maternal Grandmother   . Heart disease Maternal Grandmother   . Stomach cancer  Maternal Uncle   . Colon cancer Maternal Uncle        x3  . Diabetes Maternal Aunt        Type I  . Diabetes Maternal Uncle        x5 - Type II  . Heart disease Paternal Grandmother   . Esophageal cancer Neg Hx   . Gallbladder disease Neg Hx   . Seizures Neg Hx     Social History   Tobacco Use  . Smoking status: Never Smoker  . Smokeless tobacco: Never Used  Substance Use Topics  . Alcohol use: No    Comment: drinks daily - none since 08/2012 per pt.  . Drug use: No    Home Medications Prior to Admission medications   Medication Sig Start Date End Date Taking? Authorizing Provider  acetaminophen (TYLENOL) 325 MG tablet Take 650 mg by mouth every 6 (six) hours as needed for mild pain or fever.   Yes [provider]  apixaban (ELIQUIS) 5 MG TABS tablet Take 1 tablet (5 mg total) by mouth 2 (two) times daily. 09/03/19 10/26/19 Yes Lendon Colonel, NP  buPROPion (WELLBUTRIN XL) 300 MG 24 hr tablet Take 300 mg by mouth daily.  07/26/18  Yes [provider]  clonazePAM (KLONOPIN) 1 MG tablet Take 1 mg by mouth 4 (four) times daily as needed for anxiety.  03/26/19  Yes [provider]  diclofenac Sodium (VOLTAREN) 1 % GEL Apply 2 g topically daily as needed (pain).  11/10/15  Yes [provider]  diltiazem (CARDIZEM CD) 300 MG 24 hr capsule Take 1 capsule (300 mg total) by mouth daily. 09/04/19 10/26/19 Yes Minus Breeding, MD  FLUoxetine (PROZAC) 20 MG capsule Take 20 mg by mouth at bedtime.  07/26/18  Yes [provider]  furosemide (LASIX) 20 MG tablet Take 1 tablet (20 mg total) by mouth 2 (two) times daily. 10/22/19 01/20/20 Yes Sherran Needs, NP  gabapentin (NEURONTIN) 600 MG tablet Take 1,200 mg by mouth as needed.  08/12/18  Yes [provider]  metoprolol succinate (TOPROL-XL) 25 MG 24 hr tablet Take 1 tablet (25 mg total) by mouth at bedtime. 09/18/19  Yes Sherran Needs, NP  OLANZapine (ZYPREXA) 10 MG tablet Take 10 mg by  mouth at bedtime. 04/04/19  Yes [provider]  potassium chloride SA (KLOR-CON) 20 MEQ tablet Take 2 tablets by mouth for the next 2 days then 1 tablet daily Patient taking differently: Take 20 mEq by mouth daily.  10/23/19  Yes Sherran Needs, NP    Allergies    Nsaids, Adhesive [tape], Latex, and Sulfa antibiotics  Review of Systems   Review of Systems  Neurological: Positive for weakness.  All other systems reviewed and are negative.   Physical Exam Updated Vital Signs BP 132/82   Pulse 72   Temp 99.1 F (37.3 C) (Oral)   Resp 12   Ht 5\' 5"  (1.651 m)   Wt (!) 140.6 kg   LMP 09/30/2012  SpO2 97%   BMI 51.59 kg/m   Physical Exam Vitals and nursing note reviewed.  Constitutional:      General: She is not in acute distress.    Appearance: Normal appearance. She is well-developed.  HENT:     Head: Normocephalic and atraumatic.  Eyes:     Conjunctiva/sclera: Conjunctivae normal.     Pupils: Pupils are equal, round, and reactive to light.  Cardiovascular:     Rate and Rhythm: Normal rate and regular rhythm.     Heart sounds: Normal heart sounds.  Pulmonary:     Effort: Pulmonary effort is normal. No respiratory distress.     Comments: Decreased breath sounds at bilateral bases Abdominal:     General: There is no distension.     Palpations: Abdomen is soft.     Tenderness: There is no abdominal tenderness.  Musculoskeletal:        General: No deformity. Normal range of motion.     Cervical back: Normal range of motion and neck supple.  Skin:    General: Skin is warm and dry.  Neurological:     General: No focal deficit present.     Mental Status: She is alert and oriented to person, place, and time. Mental status is at baseline.     ED Results / Procedures / Treatments   Labs (all labs ordered are listed, but only abnormal results are displayed) Labs Reviewed  COMPREHENSIVE METABOLIC PANEL - Abnormal; Notable for the following components:       Result Value   Glucose, Bld 104 (*)    Calcium 8.8 (*)    All other components within normal limits  CBC WITH DIFFERENTIAL/PLATELET - Abnormal; Notable for the following components:   WBC 11.3 (*)    Hemoglobin 9.7 (*)    HCT 32.6 (*)    MCV 79.5 (*)    MCH 23.7 (*)    MCHC 29.8 (*)    RDW 16.3 (*)    Neutro Abs 9.0 (*)    All other components within normal limits  LACTIC ACID, PLASMA - Abnormal; Notable for the following components:   Lactic Acid, Venous 2.1 (*)    All other components within normal limits  BLOOD GAS, ARTERIAL - Abnormal; Notable for the following components:   pH, Arterial 7.337 (*)    pCO2 arterial 53.9 (*)    pO2, Arterial 68.2 (*)    Acid-Base Excess 2.1 (*)    All other components within normal limits  RESPIRATORY PANEL BY RT PCR (FLU A&B, COVID)  CULTURE, BLOOD (ROUTINE X 2)  CULTURE, BLOOD (ROUTINE X 2)  BRAIN NATRIURETIC PEPTIDE  URINALYSIS, ROUTINE W REFLEX MICROSCOPIC  LACTIC ACID, PLASMA  TROPONIN I (HIGH SENSITIVITY)  TROPONIN I (HIGH SENSITIVITY)    EKG EKG Interpretation  Date/Time:  Friday October 26 2019 20:25:00 EST Ventricular Rate:  81 PR Interval:    QRS Duration: 101 QT Interval:  394 QTC Calculation: 458 R Axis:   62 Text Interpretation: Sinus rhythm Abnormal R-wave progression, early transition Confirmed by Dene Gentry [id number]) on 10/26/2019 8:58:42 PM   Radiology DG Chest Port 1 View  Result Date: 10/26/2019 CLINICAL DATA:  Dyspnea and fever EXAM: PORTABLE CHEST 1 VIEW COMPARISON:  08/13/2019, 07/30/2019 FINDINGS: Cardiomegaly with central vascular congestion. Hazy atelectasis or edema at the bases. No pleural effusion. Aortic atherosclerosis. No pneumothorax. IMPRESSION: Cardiomegaly with vascular congestion and hazy atelectasis or edema at the bases. Electronically Signed   By: Madie Reno.D.  On: 10/26/2019 21:59    Procedures Procedures (including critical care time)  Medications Ordered in ED Medications   cefTRIAXone (ROCEPHIN) 1 g in sodium chloride 0.9 % 100 mL IVPB (1 g Intravenous New Bag/Given 10/27/19 0002)  azithromycin (ZITHROMAX) 500 mg in sodium chloride 0.9 % 250 mL IVPB (has no administration in time range)  sodium chloride 0.9 % bolus 500 mL (500 mLs Intravenous New Bag/Given 10/27/19 0001)    ED Course  I have reviewed the triage vital signs and the nursing notes.  Pertinent labs & imaging results that were available during my care of the patient were reviewed by me and considered in my medical decision making (see chart for details).  Clinical Course as of Oct 26 12  Fri Oct 26, 2019  2355 HCT(!): 32.6 [PM]    Clinical Course User Index [PM] Valarie Merino, MD   MDM Rules/Calculators/A&P                      MDM  Screen complete  Tammy [name] was evaluated in Emergency Department on 10/27/2019 for the symptoms described in the history of present illness. She was evaluated in the context of the global COVID-19 pandemic, which necessitated consideration that the patient might be at risk for infection with the SARS-CoV-2 virus that causes COVID-19. Institutional protocols and algorithms that pertain to the evaluation of patients at risk for COVID-19 are in a state of rapid change based on information released by regulatory bodies including the CDC and federal and state organizations. These policies and algorithms were followed during the patient's care in the ED.  Patient is presenting for evaluation of weakness and fatigue.  Patient is noted to be mildly hypoxic on initial arrival.  Fever also noted on initial evaluation.  Patient given CAP antibiotic coverage.  Covid PCR testing is negative.  Suspect likely CAP.  Patient will require admission for further work-up and treatment.  Hospitalist service is aware of case and need to admit.   Final Clinical Impression(s) / ED Diagnoses Final diagnoses:  Weakness  Fever, unspecified fever cause    Rx / DC Orders ED  Discharge Orders    None       Valarie Merino, MD 10/27/19 [phone]

## 2019-10-27 NOTE — Progress Notes (Addendum)
Patient was admitted earlier today.  As per H&P done earlier today "Azrielle K Plummeris a 59 y.o.femalewith medical history significant for history of atrial fibrillation on anticoagulation, obesity with BMI of 51, depression/anxiety on benzodiazepines, chronic pain syndrome on high dose Gabapentin, urinary incontinence.She presented to the ED for evaluation after a fall at home.  She had presented to the ER in October 2020 with similar complaints of a fall at home.  On that admission, patient was diagnosed with atrial fibrillation and treated for pneumonia.  As per patient, she fell this evening at home was unable to get up, stating husband to call EMS and patient brought to the emergency room.  At the time of evaluation, patient was altered mental status but was able to verbalize some questions appropriately.  She is easily arousable.  She does not recall hitting her head.  Denies any nausea vomiting.  Denies any joint pains at this time except for chronic pain in lower extremities.  She admits to taking her gabapentin and benzodiazepines earlier in the day.  He denies any chest pain or palpitations at this time.  Denies any use of illicit drugs or alcohol use.  She denies any fever or chills.  Denies any cough at this time.  Denies any sick contacts.  She also denies any known contact with COVID-19.  No loss of taste,   She reports progressive fatigue since yesterday but she was up and walking around. uria   ED Course: O2 sats- 92%. Patient had presented with acute encephalopathy and a fall at home. CT scan is yet to be done. Tmax99.1.WBC 11.3k. CXR  Patient started on ceftriaxone and azithromycin empirically".  UA suggestive of likely UTI. Will order urine culture. We will also check ammonia level. Patient seems volume depleted. UDS was positive for benzodiazepine and opiates. Patient has combined respiratory failure, likely OSA and OHS.  We will consult PT and OT. Hopefully, patient be  discharged tomorrow. Consider outpatient sleep studies on discharge, if not already done.

## 2019-10-27 NOTE — Evaluation (Signed)
Occupational Therapy Evaluation and Discharge Patient Details Name: Tammy [name] MRN: [id number] DOB: [name] 02, 1962 Today's Date: 10/27/2019    History of Present Illness Linea K Plummeris a 59 y.o.femalewith PMH including A fib on anticoagulation,obesity with BMI of 51, depression/anxietyon benzodiazepines, chronic pain syndrome on high dose Gabapentin, urinary incontinence.Shepresented to the ED for evaluation after a fall at home. CT of head clear.    Clinical Impression   Prior to admission Pt is independent in ADL and mobility. When I asked what happened she said, "my legs just went out on me and then I couldn't get up" Today during session she was min guard for all apects of ADL and transfers. Able to complete LB bathing/dressing, UB bathing/dressing, sink level grooming (leans against sink), peri care for front and back in standing (no DME). Educated on safety with shower and fall prevention. Pt verbalized understanding. Education complete and OT will sign off from a transfer and ADL perspective. Anticipate that PT will push ambulation further as Pt has a flight of stairs to get up to access bed/bath.     Follow Up Recommendations  No OT follow up    Equipment Recommendations  None recommended by OT(Pt has appropriate DME)    Recommendations for Other Services       Precautions / Restrictions Precautions Precautions: Fall Restrictions Weight Bearing Restrictions: No      Mobility Bed Mobility Overal bed mobility: Needs Assistance Bed Mobility: Supine to Sit;Sit to Supine     Supine to sit: Min guard Sit to supine: Min guard   General bed mobility comments: use of rail to assist, no physical assist from OT  Transfers Overall transfer level: Needs assistance Equipment used: None Transfers: Sit to/from Stand Sit to Stand: Min guard         General transfer comment: for safety    Balance Overall balance assessment: Mild deficits observed, not formally  tested(improved as session went on)                                         ADL either performed or assessed with clinical judgement   ADL Overall ADL's : Needs assistance/impaired Eating/Feeding: Modified independent   Grooming: Wash/dry hands;Wash/dry face;Oral care;Min guard;Standing Grooming Details (indicate cue type and reason): sink level, uses sink for balance Upper Body Bathing: Set up;Sitting   Lower Body Bathing: Set up;Sitting/lateral leans Lower Body Bathing Details (indicate cue type and reason): in recliner, reports that she has a shower stool if she needs it Upper Body Dressing : Set up;Sitting   Lower Body Dressing: Min guard;Sit to/from stand;Sitting/lateral leans Lower Body Dressing Details (indicate cue type and reason): able to don/doff socks, and perform sit<>stand  Toilet Transfer: Min guard;Ambulation Toilet Transfer Details (indicate cue type and reason): did use grab bars Toileting- Clothing Manipulation and Hygiene: Set up;Sit to/from stand Toileting - Clothing Manipulation Details (indicate cue type and reason): able to perform front and rear peri care in standing with warm wash cloths   Tub/Shower Transfer Details (indicate cue type and reason): she typically steps over tub, talked about using her husband's walk in for safety Functional mobility during ADLs: Min guard       Vision Baseline Vision/History: Wears glasses Wears Glasses: At all times Patient Visual Report: No change from baseline Vision Assessment?: No apparent visual deficits     Perception     Praxis  Pertinent Vitals/Pain Pain Assessment: 0-10 Pain Score: 6  Pain Location: headache Pain Descriptors / Indicators: Headache Pain Intervention(s): Monitored during session;RN gave pain meds during session     Hand Dominance Right   Extremity/Trunk Assessment Upper Extremity Assessment Upper Extremity Assessment: Overall WFL for tasks assessed   Lower  Extremity Assessment Lower Extremity Assessment: Defer to PT evaluation   Cervical / Trunk Assessment Cervical / Trunk Assessment: Other exceptions Cervical / Trunk Exceptions: morbid obesity   Communication Communication Communication: No difficulties   Cognition Arousal/Alertness: Awake/alert Behavior During Therapy: WFL for tasks assessed/performed Overall Cognitive Status: Within Functional Limits for tasks assessed                                     General Comments  Pt found in urine saturated bed due to purewick not working properly, Pt is absolutely capable to walking to the bathroom or using the [facility], and she elected to replace the purewick    Exercises     Shoulder Instructions      Home Living Family/patient expects to be discharged to:: Private residence Living Arrangements: Spouse/significant other Available Help at Discharge: Family;Available 24 hours/day Type of Home: House Home Access: Level entry     Home Layout: Two level Alternate Level Stairs-Number of Steps: 13 Alternate Level Stairs-Rails: Right Bathroom Shower/Tub: Tub/shower unit   Bathroom Toilet: Handicapped height     Home Equipment: Environmental consultant - 2 wheels;Bedside commode;Crutches;Shower seat          Prior Functioning/Environment Level of Independence: Independent                 OT Problem List: Obesity;Pain      OT Treatment/Interventions:      OT Goals(Current goals can be found in the care plan section) Acute Rehab OT Goals Patient Stated Goal: to get home OT Goal Formulation: With patient Time For Goal Achievement: 11/10/19 Potential to Achieve Goals: Good  OT Frequency:     Barriers to D/C:            Co-evaluation              AM-PAC OT "6 Clicks" Daily Activity     Outcome Measure Help from another person eating meals?: None Help from another person taking care of personal grooming?: A Little Help from another person toileting, which  includes using toliet, bedpan, or urinal?: A Little Help from another person bathing (including washing, rinsing, drying)?: A Little Help from another person to put on and taking off regular upper body clothing?: A Little Help from another person to put on and taking off regular lower body clothing?: A Little 6 Click Score: 19   End of Session Nurse Communication: Mobility status;Patient requests pain meds  Activity Tolerance: Patient tolerated treatment well Patient left: in bed;with call bell/phone within reach;Other (comment)(fall mats in place)  OT Visit Diagnosis: History of falling (Z91.81);Pain Pain - part of body: (HEADACHE)                Time: [id number] OT Time Calculation (min): 31 min Charges:  OT General Charges $OT Visit: 1 Visit OT Evaluation $OT Eval Moderate Complexity: 1 Mod OT Treatments $Self Care/Home Management : 8-22 mins  Jesse Sans OTR/L Acute Rehabilitation Services Pager: [phone] Office: [place] 10/27/2019, 4:59 PM

## 2019-10-27 NOTE — H&P (Signed)
History and Physical    Tammy [name] [id number] DOB: [birth year] DOA: 10/26/2019  PCP: Fanny Bien, MD   Patient coming from: Home after EMS was called   I have personally briefly reviewed patient's old medical records in [place]  Chief Complaint: Fall at home  History of present illness: Tammy K Plummeris a 59 y.o.femalewith medical history significant for history of atrial fibrillation on anticoagulation, obesity with BMI of 51, depression/anxiety on benzodiazepines, chronic pain syndrome on high dose Gabapentin, urinary incontinence.She presented to the ED for evaluation after a fall at home.  She had presented to the ER in October 2020 with similar complaints of a fall at home.  On that admission, patient was diagnosed with atrial fibrillation and treated for pneumonia.  As per patient, she fell this evening at home was unable to get up, stating husband to call EMS and patient brought to the emergency room.  At the time of evaluation, patient was altered mental status but was able to verbalize some questions appropriately.  She is easily arousable.  She does not recall hitting her head.  Denies any nausea vomiting.  Denies any joint pains at this time except for chronic pain in lower extremities.  She admits to taking her gabapentin and benzodiazepines earlier in the day.  He denies any chest pain or palpitations at this time.  Denies any use of illicit drugs or alcohol use.  She denies any fever or chills.  Denies any cough at this time.  Denies any sick contacts.  She also denies any known contact with COVID-19.  No loss of taste,   She reports progressive fatigue since yesterday but she was up and walking around. uria   ED Course: O2 sats- 92%. Patient had presented with acute encephalopathy and a fall at home. CT scan is yet to be done. Tmax99.1.WBC 11.3k. CXR  Patient started on ceftriaxone and azithromycin empirically    Review of Systems: As per HPI otherwise  10 point review of systems negative.  Pertinent positives and negatives as noted in HPI.  Past Medical History:  Diagnosis Date  . Anxiety   . Arthritis    neck and lower back - deg disc disease, fingers  . Depression   . Frequency of urination   . GERD (gastroesophageal reflux disease)   . History of seizure per pt no seizure since    11-07-2015  in setting of stress and sleep deprivation  /  negative work-up by neurologist unknown idiology  . Hypothyroidism   . Injury of thumb, left    03-31-2016    . Pelvic relaxation   . SUI (stress urinary incontinence, female)     Past Surgical History:  Procedure Laterality Date  . ANTERIOR AND POSTERIOR REPAIR  09/25/2012   Procedure: ANTERIOR (CYSTOCELE) AND POSTERIOR REPAIR (RECTOCELE);  Surgeon: Lahoma Crocker, MD;  Location: [facility];  Service: Gynecology;  Laterality: N/A;  . ANTERIOR AND POSTERIOR REPAIR WITH SACROSPINOUS FIXATION N/A 04/12/2016   Procedure: ANTERIOR AND POSTERIOR REPAIR WITH SACROSPINOUS LIGAMENT SUSPENSION, CYSTOSCOPY;  Surgeon: Arvella Nigh, MD;  Location: [facility];  Service: Gynecology;  Laterality: N/A;  . APPENDECTOMY  1987  . BREAST EXCISIONAL BIOPSY Left    No scar visable   . BREAST REDUCTION SURGERY  2000  aprrox  . ENDOMETRIAL ABLATION W/ NOVASURE  2011   in office  . EXCISION LEFT BREAST BX  12-07-1999   benign  . LAPAROSCOPIC CHOLECYSTECTOMY  02-09-2005  . LAPAROSCOPIC  VAGINAL HYSTERECTOMY WITH SALPINGO OOPHORECTOMY Bilateral 04/12/2016   Procedure: LAPAROSCOPIC ASSISTED VAGINAL HYSTERECTOMY WITH SALPINGO OOPHORECTOMY;  Surgeon: Arvella Nigh, MD;  Location: [facility];  Service: Gynecology;  Laterality: Bilateral;  . REDUCTION MAMMAPLASTY Bilateral   . ROUX-EN-Y GASTRIC BYPASS  06/  2002  . TUBAL LIGATION  10/ 1990     reports that she has never smoked. She has never used smokeless tobacco. She reports that she does not drink alcohol or use drugs.  Allergies   Allergen Reactions  . Nsaids Other (See Comments)    Post Gastric Bypass  . Adhesive [Tape] Itching and Rash  . Latex Itching and Rash  . Sulfa Antibiotics Itching and Rash    Family History  Problem Relation Age of Onset  . Colon polyps Mother   . Heart disease Father 32       CABG  . Breast cancer Maternal Aunt   . Stomach cancer Maternal Grandmother   . Diabetes Maternal Grandmother   . Heart disease Maternal Grandmother   . Stomach cancer Maternal Uncle   . Colon cancer Maternal Uncle        x3  . Diabetes Maternal Aunt        Type I  . Diabetes Maternal Uncle        x5 - Type II  . Heart disease Paternal Grandmother   . Esophageal cancer Neg Hx   . Gallbladder disease Neg Hx   . Seizures Neg Hx     Prior to Admission medications   Medication Sig Start Date End Date Taking? Authorizing Provider  acetaminophen (TYLENOL) 325 MG tablet Take 650 mg by mouth every 6 (six) hours as needed for mild pain or fever.   Yes [provider]  apixaban (ELIQUIS) 5 MG TABS tablet Take 1 tablet (5 mg total) by mouth 2 (two) times daily. 09/03/19 10/26/19 Yes Lendon Colonel, NP  buPROPion (WELLBUTRIN XL) 300 MG 24 hr tablet Take 300 mg by mouth daily.  07/26/18  Yes [provider]  clonazePAM (KLONOPIN) 1 MG tablet Take 1 mg by mouth 4 (four) times daily as needed for anxiety.  03/26/19  Yes [provider]  diclofenac Sodium (VOLTAREN) 1 % GEL Apply 2 g topically daily as needed (pain).  11/10/15  Yes [provider]  diltiazem (CARDIZEM CD) 300 MG 24 hr capsule Take 1 capsule (300 mg total) by mouth daily. 09/04/19 10/26/19 Yes Minus Breeding, MD  FLUoxetine (PROZAC) 20 MG capsule Take 20 mg by mouth at bedtime.  07/26/18  Yes [provider]  furosemide (LASIX) 20 MG tablet Take 1 tablet (20 mg total) by mouth 2 (two) times daily. 10/22/19 01/20/20 Yes Sherran Needs, NP  gabapentin (NEURONTIN) 600 MG tablet Take 1,200 mg by mouth as  needed.  08/12/18  Yes [provider]  metoprolol succinate (TOPROL-XL) 25 MG 24 hr tablet Take 1 tablet (25 mg total) by mouth at bedtime. 09/18/19  Yes Sherran Needs, NP  OLANZapine (ZYPREXA) 10 MG tablet Take 10 mg by mouth at bedtime. 04/04/19  Yes [provider]  potassium chloride SA (KLOR-CON) 20 MEQ tablet Take 2 tablets by mouth for the next 2 days then 1 tablet daily Patient taking differently: Take 20 mEq by mouth daily.  10/23/19  Yes Sherran Needs, NP    Physical Exam: Vitals:   10/26/19 2238 10/26/19 2300 10/26/19 2330 10/27/19 0000  BP: 125/76 119/78 119/67 132/82  Pulse: 72 70 71 72  Resp: 11 12 12 12   Temp: 99.1 F (37.3 C)     TempSrc: Oral     SpO2: 97% 97% 96% 97%  Weight:      Height:        Constitutional: Obese Caucasian female with laying comfortably in bed.  She looks lethargic with difficulty keeping awake.  She is however arousable to verbal stimuli and answers to questions but drifts back to sleep.  No evidence of any acute distress. Vitals:   10/26/19 2238 10/26/19 2300 10/26/19 2330 10/27/19 0000  BP: 125/76 119/78 119/67 132/82  Pulse: 72 70 71 72  Resp: 11 12 12 12   Temp: 99.1 F (37.3 C)     TempSrc: Oral     SpO2: 97% 97% 96% 97%  Weight:      Height:       Eyes: Wears glasses.  PERRL, lids and conjunctivae normal ENMT: Mucous membranes are moist. Posterior pharynx clear of any exudate or lesions.Normal dentition.  Neck: normal, supple, no masses, no thyromegaly Respiratory: Diminished breath sounds bilaterally in lung bases. No wheezing appreciated, no crackles. Normal respiratory effort. No accessory muscle use.  Cardiovascular: Regular rate and rhythm, no murmurs / rubs / gallops. No extremity edema. 2+ pedal pulses. No carotid bruits.  Abdomen: no tenderness, no masses palpated. No hepatosplenomegaly. Bowel sounds positive.  Musculoskeletal: no clubbing / cyanosis. No joint deformity upper and lower extremities. Good  ROM, no contractures. Normal muscle tone.  Skin: no rashes, lesions, ulcers. No induration Neurologic: Difficult to thoroughly assess at this time due to patient's mentation and difficulty falling appropriate commands.  No obvious focal deficits appreciated at this time.  Psychiatric: Poor judgment and insight.  Not oriented to place or time.  I have personally reviewed following labs and imaging studies  CBC: Recent Labs  Lab 10/22/19 1529 10/26/19 2128  WBC 6.8 11.3*  NEUTROABS  --  9.0*  HGB 10.4* 9.7*  HCT 35.5* 32.6*  MCV 80.3 79.5*  PLT 343 [id number]   Basic Metabolic Panel: Recent Labs  Lab 10/22/19 1529 10/26/19 2128  NA 142 138  K 3.0* 3.9  CL 107 102  CO2 23 27  GLUCOSE 96 104*  BUN 5* 14  CREATININE 0.75 0.99  CALCIUM 8.8* 8.8*   GFR: Estimated Creatinine Clearance: 90.6 mL/min (by C-G formula based on SCr of 0.99 mg/dL). Liver Function Tests: Recent Labs  Lab 10/26/19 2128  AST 18  ALT 14  ALKPHOS 113  BILITOT 0.4  PROT 7.1  ALBUMIN 3.7   No results for input(s): LIPASE, AMYLASE in the last 168 hours. No results for input(s): AMMONIA in the last 168 hours. Coagulation Profile: No results for input(s): INR, PROTIME in the last 168 hours. Cardiac Enzymes: No results for input(s): CKTOTAL, CKMB, CKMBINDEX, TROPONINI in the last 168 hours. BNP (last 3 results) No results for input(s): PROBNP in the last 8760 hours. HbA1C: No results for input(s): HGBA1C in the last 72 hours. CBG: No results for input(s): GLUCAP in the last 168 hours. Lipid Profile: No results for input(s): CHOL, HDL, LDLCALC, TRIG, CHOLHDL, LDLDIRECT in the last 72 hours. Thyroid Function Tests: No results for input(s): TSH, T4TOTAL, FREET4, T3FREE, THYROIDAB in the last 72 hours. Anemia Panel: No results for input(s): VITAMINB12, FOLATE, FERRITIN, TIBC, IRON, RETICCTPCT in the last 72 hours. Urine analysis:    Component Value Date/Time   COLORURINE YELLOW 07/31/2019 2200    APPEARANCEUR CLEAR 07/31/2019 2200   LABSPEC 1.012 07/31/2019 2200  PHURINE 5.0 07/31/2019 2200   GLUCOSEU NEGATIVE 07/31/2019 2200   HGBUR MODERATE (A) 07/31/2019 2200   BILIRUBINUR NEGATIVE 07/31/2019 2200   KETONESUR 5 (A) 07/31/2019 2200   PROTEINUR NEGATIVE 07/31/2019 2200   UROBILINOGEN 1.0 03/13/2017 1637   NITRITE NEGATIVE 07/31/2019 2200   LEUKOCYTESUR MODERATE (A) 07/31/2019 2200    Radiological Exams on Admission: DG Chest Port 1 View  Result Date: 10/26/2019 CLINICAL DATA:  Dyspnea and fever EXAM: PORTABLE CHEST 1 VIEW COMPARISON:  08/13/2019, 07/30/2019 FINDINGS: Cardiomegaly with central vascular congestion. Hazy atelectasis or edema at the bases. No pleural effusion. Aortic atherosclerosis. No pneumothorax. IMPRESSION: Cardiomegaly with vascular congestion and hazy atelectasis or edema at the bases. Electronically Signed   By: Donavan Foil M.D.   On: 10/26/2019 21:59    EKG: Independently reviewed.  Assessment/Plan Active Problems:   Encephalopathy acute  #1.  Acute encephalopathy likely medication induced from high doses of gabapentin and benzodiazepines.  Urine drug screen pending.  Conservative management with monitoring is advised at this time.  Fall precautions advised.  Continue with IV fluids.  CT scan was not done initially on admission.  This has been requested and pending.  Rule out any intracranial abnormality.  Follow-up CT scan.  #2.  Suspected UTI in the context of known history of urine incontinence.  Straight cath has been advised to obtain urine for UA.  Awaiting urinalysis and urine cultures.  Empiric antibiotics were initiated with Rocephin.  #3.  Suspected pneumonia based on chest x-ray findings.  As noted above, patient was started on Rocephin and azithromycin on admission.  Symptoms less likely suggesting pneumonia.  #4.  History of atrial fibrillation on chronic anticoagulation with Eliquis. Currently rate controlled.  Continue home medications.   Awaiting CT scan to start NOAC's  #5.  Abnormal chest x-ray findings suggesting vascular congestion and hazy atelectasis/edema.COVID 19 test was negative. Empirically cover with antibiotics pending cultures and sensitivity report.  #6.  Status post fall, Less likely syncopal event.  Most likely related to the encephalopathy.  CT head has been requested).  #7. Morbid obesity: Lifestyle modifications advised.   DVT prophylaxis: Eliquis Code Status: Full CODE Family Communication:  Disposition Plan: Home with family Consults called: None Admission status: Observation  Artist Beach MD [facility] Pager [phone]  If 7PM-7AM, please contact night-coverage [email or web address] Password [facility]  10/27/2019, 12:49 AM

## 2019-10-27 NOTE — Progress Notes (Signed)
ED TO INPATIENT HANDOFF REPORT  ED Nurse Name and Phone #: Carolyn Sylvia [phone]  S Name/Age/Gender Tammy [name] 59 y.o. female Room/Bed: WA01/WA01  Code Status   Code Status: Full Code  Home/SNF/Other Home Patient oriented to: self and place Is this baseline? No   Triage Complete: Triage complete  Chief Complaint Encephalopathy acute [G93.40]  Triage Note Pt BIB [facility] from home reporting generalized weakness x 3 days. EMS found her on the floor, she denies a fall, but states that she lowered herself to the floor and couldn't get back up. She states that she was in the floor for 4 hours. A&Ox4, but slow to respond. She denies known COVID exposure and reports a negative test 2 weeks ago. Fever of 102.7 with EMS, given 1000mg  of tylenol and 4 mg of zofran en route.     Allergies Allergies  Allergen Reactions  . Nsaids Other (See Comments)    Post Gastric Bypass  . Adhesive [Tape] Itching and Rash  . Latex Itching and Rash  . Sulfa Antibiotics Itching and Rash    Level of Care/Admitting Diagnosis ED Disposition    ED Disposition Condition Comment   Admit  Hospital Area: [facility] [id number]  Level of Care: Telemetry [5]  Admit to tele based on following criteria: Eval of Syncope  Covid Evaluation: Confirmed COVID Negative  Date Laboratory Confirmed COVID Negative: 10/28/2019  Diagnosis: Encephalopathy acute [id number]  Admitting Physician: Artist Beach [id number]  Attending Physician: Artist Beach [id number]       B Medical/Surgery History Past Medical History:  Diagnosis Date  . Anxiety   . Arthritis    neck and lower back - deg disc disease, fingers  . Depression   . Frequency of urination   . GERD (gastroesophageal reflux disease)   . History of seizure per pt no seizure since    11-07-2015  in setting of stress and sleep deprivation  /  negative work-up by neurologist unknown idiology  . Hypothyroidism   . Injury of  thumb, left    03-31-2016    . Pelvic relaxation   . SUI (stress urinary incontinence, female)    Past Surgical History:  Procedure Laterality Date  . ANTERIOR AND POSTERIOR REPAIR  09/25/2012   Procedure: ANTERIOR (CYSTOCELE) AND POSTERIOR REPAIR (RECTOCELE);  Surgeon: Lahoma Crocker, MD;  Location: [facility];  Service: Gynecology;  Laterality: N/A;  . ANTERIOR AND POSTERIOR REPAIR WITH SACROSPINOUS FIXATION N/A 04/12/2016   Procedure: ANTERIOR AND POSTERIOR REPAIR WITH SACROSPINOUS LIGAMENT SUSPENSION, CYSTOSCOPY;  Surgeon: Arvella Nigh, MD;  Location: [facility];  Service: Gynecology;  Laterality: N/A;  . APPENDECTOMY  1987  . BREAST EXCISIONAL BIOPSY Left    No scar visable   . BREAST REDUCTION SURGERY  2000  aprrox  . ENDOMETRIAL ABLATION W/ NOVASURE  2011   in office  . EXCISION LEFT BREAST BX  12-07-1999   benign  . LAPAROSCOPIC CHOLECYSTECTOMY  02-09-2005  . LAPAROSCOPIC VAGINAL HYSTERECTOMY WITH SALPINGO OOPHORECTOMY Bilateral 04/12/2016   Procedure: LAPAROSCOPIC ASSISTED VAGINAL HYSTERECTOMY WITH SALPINGO OOPHORECTOMY;  Surgeon: Arvella Nigh, MD;  Location: [facility];  Service: Gynecology;  Laterality: Bilateral;  . REDUCTION MAMMAPLASTY Bilateral   . ROUX-EN-Y GASTRIC BYPASS  06/  2002  . TUBAL LIGATION  10/ 1990     A IV Location/Drains/Wounds Patient Lines/Drains/Airways Status   Active Line/Drains/Airways    Name:   Placement date:   Placement time:   Site:   Days:  Peripheral IV 10/26/19 Left Wrist   10/26/19    2135    Wrist   1   Incision 09/25/12 Perineum Other (Comment)   09/25/12    0755     2588   Incision (Closed) 04/12/16 Abdomen Other (Comment)   04/12/16    1040     1293   Incision - 3 Ports Abdomen Umbilicus Distal  Other (Comment)   04/12/16    0808     1293          Intake/Output Last 24 hours  Intake/Output Summary (Last 24 hours) at 10/27/2019 0326 Last data filed at 10/27/2019 0308 Gross per 24 hour  Intake 950  ml  Output --  Net 950 ml    Labs/Imaging Results for orders placed or performed during the hospital encounter of 10/26/19 (from the past 48 hour(s))  Blood gas, arterial (at [facility])     Status: Abnormal   Collection Time: 10/26/19  8:41 PM  Result Value Ref Range   pH, Arterial 7.337 (L) 7.350 - 7.450   pCO2 arterial 53.9 (H) 32.0 - 48.0 mmHg   pO2, Arterial 68.2 (L) 83.0 - 108.0 mmHg   Bicarbonate 27.6 20.0 - 28.0 mmol/L   Acid-Base Excess 2.1 (H) 0.0 - 2.0 mmol/L   O2 Saturation 89.1 %   Patient temperature 100.6    Collection site RIGHT RADIAL    Allens test (pass/fail) PASS PASS    Comment: Performed at [facility], [place] [address] [place], [place] [address]  Respiratory Panel by RT PCR (Flu A&B, Covid) - Nasopharyngeal Swab     Status: None   Collection Time: 10/26/19  9:28 PM   Specimen: Nasopharyngeal Swab  Result Value Ref Range   SARS Coronavirus 2 by RT PCR NEGATIVE NEGATIVE    Comment: (NOTE) SARS-CoV-2 target nucleic acids are NOT DETECTED. The SARS-CoV-2 RNA is generally detectable in upper respiratoy specimens during the acute phase of infection. The lowest concentration of SARS-CoV-2 viral copies this assay can detect is 131 copies/mL. A negative result does not preclude SARS-Cov-2 infection and should not be used as the sole basis for treatment or other patient management decisions. A negative result may occur with  improper specimen collection/handling, submission of specimen other than nasopharyngeal swab, presence of viral mutation(s) within the areas targeted by this assay, and inadequate number of viral copies (<131 copies/mL). A negative result must be combined with clinical observations, patient history, and epidemiological information. The expected result is Negative. Fact Sheet for Patients:  PinkCheek.be Fact Sheet for Healthcare Providers:  GravelBags.it This test is  not yet ap proved or cleared by the [place] FDA and  has been authorized for detection and/or diagnosis of SARS-CoV-2 by FDA under an Emergency Use Authorization (EUA). This EUA will remain  in effect (meaning this test can be used) for the duration of the COVID-19 declaration under Section 564(b)(1) of the Act, 21 U.S.C. section 360bbb-3(b)(1), unless the authorization is terminated or revoked sooner.    Influenza A by PCR NEGATIVE NEGATIVE   Influenza B by PCR NEGATIVE NEGATIVE    Comment: (NOTE) The Xpert Xpress SARS-CoV-2/FLU/RSV assay is intended as an aid in  the diagnosis of influenza from Nasopharyngeal swab specimens and  should not be used as a sole basis for treatment. Nasal washings and  aspirates are unacceptable for Xpert Xpress SARS-CoV-2/FLU/RSV  testing. Fact Sheet for Patients: PinkCheek.be Fact Sheet for Healthcare Providers: GravelBags.it This test is not yet approved  or cleared by the [place] and  has been authorized for detection and/or diagnosis of SARS-CoV-2 by  FDA under an Emergency Use Authorization (EUA). This EUA will remain  in effect (meaning this test can be used) for the duration of the  Covid-19 declaration under Section 564(b)(1) of the Act, 21  U.S.C. section 360bbb-3(b)(1), unless the authorization is  terminated or revoked. Performed at [facility], [place] [address], [place], [place] [address]   Comprehensive metabolic panel     Status: Abnormal   Collection Time: 10/26/19  9:28 PM  Result Value Ref Range   Sodium 138 135 - 145 mmol/L   Potassium 3.9 3.5 - 5.1 mmol/L   Chloride 102 98 - 111 mmol/L   CO2 27 22 - 32 mmol/L   Glucose, Bld 104 (H) 70 - 99 mg/dL   BUN 14 6 - 20 mg/dL   Creatinine, Ser 0.99 0.44 - 1.00 mg/dL   Calcium 8.8 (L) 8.9 - 10.3 mg/dL   Total Protein 7.1 6.5 - 8.1 g/dL   Albumin 3.7 3.5 - 5.0 g/dL   AST 18 15 - 41 U/L   ALT 14  0 - 44 U/L   Alkaline Phosphatase 113 38 - 126 U/L   Total Bilirubin 0.4 0.3 - 1.2 mg/dL   GFR calc non Af Amer >60 >60 mL/min   GFR calc Af Amer >60 >60 mL/min   Anion gap 9 5 - 15    Comment: Performed at [facility], [place] [address] [place], [place] [address]  CBC with Differential     Status: Abnormal   Collection Time: 10/26/19  9:28 PM  Result Value Ref Range   WBC 11.3 (H) 4.0 - 10.5 K/uL   RBC 4.10 3.87 - 5.11 MIL/uL   Hemoglobin 9.7 (L) 12.0 - 15.0 g/dL   HCT 32.6 (L) 36.0 - 46.0 %   MCV 79.5 (L) 80.0 - 100.0 fL   MCH 23.7 (L) 26.0 - 34.0 pg   MCHC 29.8 (L) 30.0 - 36.0 g/dL   RDW 16.3 (H) 11.5 - 15.5 %   Platelets 309 150 - 400 K/uL   nRBC 0.0 0.0 - 0.2 %   Neutrophils Relative % 79 %   Neutro Abs 9.0 (H) 1.7 - 7.7 K/uL   Lymphocytes Relative 13 %   Lymphs Abs 1.4 0.7 - 4.0 K/uL   Monocytes Relative 7 %   Monocytes Absolute 0.7 0.1 - 1.0 K/uL   Eosinophils Relative 1 %   Eosinophils Absolute 0.1 0.0 - 0.5 K/uL   Basophils Relative 0 %   Basophils Absolute 0.0 0.0 - 0.1 K/uL   Immature Granulocytes 0 %   Abs Immature Granulocytes 0.03 0.00 - 0.07 K/uL    Comment: Performed at [facility], [place] [address] [place], [place] [address]  Lactic acid, plasma     Status: Abnormal   Collection Time: 10/26/19  9:28 PM  Result Value Ref Range   Lactic Acid, Venous 2.1 (HH) 0.5 - 1.9 mmol/L    Comment: CRITICAL RESULT CALLED TO, READ BACK BY AND VERIFIED WITH: J,TALKINGTON AT 2222 ON 10/26/19 BY A,MOHAMED Performed at [facility], [place] [address] [place], [place] [address]   Brain natriuretic peptide     Status: None   Collection Time: 10/26/19  9:28 PM  Result Value Ref Range   B Natriuretic Peptide 89.0 0.0 - 100.0 pg/mL    Comment: Performed at [facility],  [place] [address], [place], [place] [address]  Troponin I (High Sensitivity)     Status: None   Collection Time: 10/26/19  9:28 PM  Result  Value Ref Range   Troponin I (High Sensitivity) 5 <18 ng/L    Comment: (NOTE) Elevated high sensitivity troponin I (hsTnI) values and significant  changes across serial measurements may suggest ACS but many other  chronic and acute conditions are known to elevate hsTnI results.  Refer to the "Links" section for chest pain algorithms and additional  guidance. Performed at [facility], [place] [address], [place], [place] [address]   Urinalysis, Routine w reflex microscopic     Status: Abnormal   Collection Time: 10/27/19  1:17 AM  Result Value Ref Range   Color, Urine YELLOW YELLOW   APPearance HAZY (A) CLEAR   Specific Gravity, Urine 1.013 1.005 - 1.030   pH 5.0 5.0 - 8.0   Glucose, UA NEGATIVE NEGATIVE mg/dL   Hgb urine dipstick MODERATE (A) NEGATIVE   Bilirubin Urine NEGATIVE NEGATIVE   Ketones, ur NEGATIVE NEGATIVE mg/dL   Protein, ur NEGATIVE NEGATIVE mg/dL   Nitrite POSITIVE (A) NEGATIVE   Leukocytes,Ua SMALL (A) NEGATIVE   RBC / HPF 6-10 0 - 5 RBC/hpf   WBC, UA 6-10 0 - 5 WBC/hpf   Bacteria, UA MANY (A) NONE SEEN   Squamous Epithelial / LPF 0-5 0 - 5   Mucus PRESENT    Hyaline Casts, UA PRESENT     Comment: Performed at [facility], [place] [address], [place], [place] [address]  Urine rapid drug screen (hosp performed)     Status: Abnormal   Collection Time: 10/27/19  1:17 AM  Result Value Ref Range   Opiates POSITIVE (A) NONE DETECTED   Cocaine NONE DETECTED NONE DETECTED   Benzodiazepines POSITIVE (A) NONE DETECTED   Amphetamines NONE DETECTED NONE DETECTED   Tetrahydrocannabinol NONE DETECTED NONE DETECTED   Barbiturates NONE DETECTED NONE DETECTED    Comment: (NOTE) DRUG SCREEN FOR MEDICAL PURPOSES ONLY.  IF CONFIRMATION IS NEEDED FOR ANY PURPOSE, NOTIFY LAB WITHIN 5 DAYS. LOWEST DETECTABLE LIMITS FOR URINE DRUG SCREEN Drug Class                     Cutoff (ng/mL) Amphetamine and metabolites    1000 Barbiturate and  metabolites    200 Benzodiazepine                 [id number] Tricyclics and metabolites     300 Opiates and metabolites        300 Cocaine and metabolites        300 THC                            50 Performed at [facility], [place] [address], [place], [place] [address]   Lactic acid, plasma     Status: None   Collection Time: 10/27/19  1:46 AM  Result Value Ref Range   Lactic Acid, Venous 0.9 0.5 - 1.9 mmol/L    Comment: Performed at [facility], [place] [address] [place], [place] [address]  Troponin I (High Sensitivity)     Status: None   Collection Time: 10/27/19  1:46 AM  Result Value Ref Range   Troponin I (High Sensitivity) 6 <18 ng/L    Comment: (NOTE) Elevated high sensitivity troponin I (hsTnI) values and significant  changes across  serial measurements may suggest ACS but many other  chronic and acute conditions are known to elevate hsTnI results.  Refer to the "Links" section for chest pain algorithms and additional  guidance. Performed at [facility], [place] [address] [place], [place] [address]    CT HEAD WO CONTRAST  Result Date: 10/27/2019 CLINICAL DATA:  Encephalopathy. Weakness, fatigue cough fever. EXAM: CT HEAD WITHOUT CONTRAST TECHNIQUE: Contiguous axial images were obtained from the base of the skull through the vertex without intravenous contrast. COMPARISON:  Head CT 07/30/2019 FINDINGS: Brain: No evidence of acute infarction, hemorrhage, hydrocephalus, extra-axial collection or mass lesion/mass effect. Vascular: No hyperdense vessel or unexpected calcification. Skull: No fracture or focal lesion. Sinuses/Orbits: Mucous retention cyst right maxillary sinus. No acute findings. Orbits are unremarkable. Other: None. IMPRESSION: No acute intracranial abnormality. Electronically Signed   By: Keith Rake M.D.   On: 10/27/2019 01:18   DG Chest Port 1 View  Result Date: 10/26/2019 CLINICAL DATA:  Dyspnea and fever  EXAM: PORTABLE CHEST 1 VIEW COMPARISON:  08/13/2019, 07/30/2019 FINDINGS: Cardiomegaly with central vascular congestion. Hazy atelectasis or edema at the bases. No pleural effusion. Aortic atherosclerosis. No pneumothorax. IMPRESSION: Cardiomegaly with vascular congestion and hazy atelectasis or edema at the bases. Electronically Signed   By: Donavan Foil M.D.   On: 10/26/2019 21:59    Pending Labs Unresulted Labs (From admission, onward)    Start     Ordered   10/27/19 0500  CBC  Tomorrow morning,   R     10/27/19 0219   10/27/19 [id number]  Basic metabolic panel  Tomorrow morning,   R     10/27/19 0219   10/27/19 0220  Culture, sputum-assessment  Once,   R     10/27/19 0219   10/27/19 0220  Urinalysis, Routine w reflex microscopic  Once,   STAT     10/27/19 0219   10/27/19 0032  Drugs of abuse scrn w alc, routine urine (ref lab)  Once,   STAT     10/27/19 0031   10/26/19 2040  Culture, blood (routine x 2)  BLOOD CULTURE X 2,   STAT     10/26/19 2039          Vitals/Pain Today's Vitals   10/27/19 0130 10/27/19 0200 10/27/19 0230 10/27/19 0300  BP: (!) 123/92 112/76 122/76 112/79  Pulse: 73 77 73 73  Resp: 13 11 13 13   Temp:      TempSrc:      SpO2: 99% 98% 99% 100%  Weight:      Height:      PainSc:        Isolation Precautions No active isolations  Medications Medications  acetaminophen (TYLENOL) tablet 650 mg (has no administration in time range)  diltiazem (CARDIZEM CD) 24 hr capsule 300 mg (has no administration in time range)  metoprolol succinate (TOPROL-XL) 24 hr tablet 25 mg (has no administration in time range)  FLUoxetine (PROZAC) capsule 20 mg (has no administration in time range)  buPROPion (WELLBUTRIN XL) 24 hr tablet 300 mg (has no administration in time range)  apixaban (ELIQUIS) tablet 5 mg (5 mg Oral Given 10/27/19 0308)  gabapentin (NEURONTIN) capsule 300 mg (has no administration in time range)  diclofenac Sodium (VOLTAREN) 1 % topical gel 2 g (has no  administration in time range)  bisacodyl (DULCOLAX) EC tablet 5 mg (has no administration in time range)  albuterol (PROVENTIL) (2.5 MG/3ML) 0.083% nebulizer solution 2.5 mg (has no administration in  time range)  cefTRIAXone (ROCEPHIN) 1 g in sodium chloride 0.9 % 100 mL IVPB (0 g Intravenous Stopped 10/27/19 0308)  cefTRIAXone (ROCEPHIN) 1 g in sodium chloride 0.9 % 100 mL IVPB (0 g Intravenous Stopped 10/27/19 0055)  azithromycin (ZITHROMAX) 500 mg in sodium chloride 0.9 % 250 mL IVPB (0 mg Intravenous Stopped 10/27/19 0218)  sodium chloride 0.9 % bolus 500 mL (0 mLs Intravenous Stopped 10/27/19 0055)    Mobility walks Moderate fall risk   Focused Assessments    R Recommendations: See Admitting Provider Note  Report given to: Verdis Frederickson, RN  Additional Notes:

## 2019-10-27 NOTE — ED Notes (Signed)
Hospitalist at bedside 

## 2019-10-27 NOTE — ED Notes (Signed)
Pt able to use bedpan for BR needs. Pt able to provide urine specimen with bedpan. Pt given new purewick and linen change.

## 2019-10-27 NOTE — ED Notes (Signed)
Pt able to stand and transfer from EMS stretcher to ED stretcher with minimal assistance.

## 2019-10-27 NOTE — Progress Notes (Signed)
Pt requesting PTA zyrexa HS. On call paged.

## 2019-10-28 LAB — CBC WITH DIFFERENTIAL/PLATELET
Abs Immature Granulocytes: 0.02 10*3/uL (ref 0.00–0.07)
Basophils Absolute: 0 10*3/uL (ref 0.0–0.1)
Basophils Relative: 0 %
Eosinophils Absolute: 0.1 10*3/uL (ref 0.0–0.5)
Eosinophils Relative: 2 %
HCT: 31.4 % — ABNORMAL LOW (ref 36.0–46.0)
Hemoglobin: 9.1 g/dL — ABNORMAL LOW (ref 12.0–15.0)
Immature Granulocytes: 0 %
Lymphocytes Relative: 19 %
Lymphs Abs: 1.3 10*3/uL (ref 0.7–4.0)
MCH: 23.1 pg — ABNORMAL LOW (ref 26.0–34.0)
MCHC: 29 g/dL — ABNORMAL LOW (ref 30.0–36.0)
MCV: 79.7 fL — ABNORMAL LOW (ref 80.0–100.0)
Monocytes Absolute: 0.6 10*3/uL (ref 0.1–1.0)
Monocytes Relative: 9 %
Neutro Abs: 4.7 10*3/uL (ref 1.7–7.7)
Neutrophils Relative %: 70 %
Platelets: 268 10*3/uL (ref 150–400)
RBC: 3.94 MIL/uL (ref 3.87–5.11)
RDW: 16.4 % — ABNORMAL HIGH (ref 11.5–15.5)
WBC: 6.7 10*3/uL (ref 4.0–10.5)
nRBC: 0 % (ref 0.0–0.2)

## 2019-10-28 LAB — RENAL FUNCTION PANEL
Albumin: 3.3 g/dL — ABNORMAL LOW (ref 3.5–5.0)
Anion gap: 8 (ref 5–15)
BUN: 7 mg/dL (ref 6–20)
CO2: 27 mmol/L (ref 22–32)
Calcium: 8.6 mg/dL — ABNORMAL LOW (ref 8.9–10.3)
Chloride: 107 mmol/L (ref 98–111)
Creatinine, Ser: 0.61 mg/dL (ref 0.44–1.00)
GFR calc Af Amer: >60 mL/min (ref >60)
GFR calc non Af Amer: >60 mL/min (ref >60)
Glucose, Bld: 116 mg/dL — ABNORMAL HIGH (ref 70–99)
Phosphorus: 3.5 mg/dL (ref 2.5–4.6)
Potassium: 3.7 mmol/L (ref 3.5–5.1)
Sodium: 142 mmol/L (ref 135–145)

## 2019-10-28 LAB — MAGNESIUM: Magnesium: 2.5 mg/dL — ABNORMAL HIGH (ref 1.7–2.4)

## 2019-10-28 MED ORDER — CLONAZEPAM 0.5 MG PO TABS: 0.2500 mg | ORAL_TABLET | Freq: Three times a day (TID) | ORAL | 0 refills | Status: DC | PRN

## 2019-10-28 MED ORDER — AMOXICILLIN-POT CLAVULANATE 875-125 MG PO TABS: 1.0000 | ORAL_TABLET | Freq: Two times a day (BID) | ORAL | 0 refills | Status: AC

## 2019-10-28 MED ORDER — GABAPENTIN 300 MG PO CAPS: 300.0000 mg | ORAL_CAPSULE | Freq: Two times a day (BID) | ORAL | 0 refills | Status: DC

## 2019-10-28 MED ORDER — AMOXICILLIN-POT CLAVULANATE 875-125 MG PO TABS: 1.0000 | ORAL_TABLET | Freq: Two times a day (BID) | ORAL | 0 refills | Status: DC

## 2019-10-28 NOTE — Discharge Summary (Signed)
Physician Discharge Summary  Patient ID: Tammy [name] MRN: [id number] DOB/AGE: Jul 24, 1961 59 y.o.  Admit date: 10/26/2019 Discharge date: 10/28/2019  Admission Diagnoses:  Discharge Diagnoses:  Active Problems:   Encephalopathy acute   Acute encephalopathy   Discharged Condition: stable  Hospital Course: Patient is a 59 year old female with past medical history significant for atrial fibrillation on anticoagulation,obesity with BMI of 51, depression/anxietyon benzodiazepines, chronic pain syndrome on high dose Gabapentin, urinary incontinence.  Patient presented to the ED for evaluation following a fall at home.  Patient had presented to the ER in October 2020 with similar complaints.  On presentation, patient was altered but easily arousable.  Patient could not recall hitting her head.  Patient was on several mind altering medications.  Patient denied use of illicit drugs or alcohol use.  Work-up done revealed likely UTI.  Patient was also volume depleted.  UDS was positive for benzodiazepines and opiates.  Patient was also felt to likely have OSA/OHS. Done on presentation revealed pH of 7.33, PCO2 of 53 and PO2 of 68.  Patient was hydrated, treated with antibiotics for possible UTI.  Patient's medication has been reviewed and adjusted.  Patient's mentation has improved significantly.  Patient is eager to be discharged back home.  Patient will follow with the primary care provider on discharge.  Further management of the likely OSA/OHS will be on outpatient basis.    Consults: None  Significant Diagnostic Studies:  UA suggestive of UTI.  Discharge Exam: Blood pressure 131/79, pulse 85, temperature 98.3 F (36.8 C), temperature source Oral, resp. rate 17, height 5\' 5"  (1.651 m), weight (!) 140.6 kg, last menstrual period 09/30/2012, SpO2 95 %.   Disposition: Discharge disposition: 01-Home or Self Care  Discharge Instructions    Diet - low sodium heart healthy   Complete by: As  directed    Increase activity slowly   Complete by: As directed      Allergies as of 10/28/2019      Reactions   Nsaids Other (See Comments)   Post Gastric Bypass   Adhesive [tape] Itching, Rash   Latex Itching, Rash   Sulfa Antibiotics Itching, Rash      Medication List    STOP taking these medications   gabapentin 600 MG tablet Commonly known as: NEURONTIN Replaced by: gabapentin 300 MG capsule     TAKE these medications   acetaminophen 325 MG tablet Commonly known as: TYLENOL Take 650 mg by mouth every 6 (six) hours as needed for mild pain or fever.   amoxicillin-clavulanate 875-125 MG tablet Commonly known as: Augmentin Take 1 tablet by mouth 2 (two) times daily for 7 days.   apixaban 5 MG Tabs tablet Commonly known as: ELIQUIS Take 1 tablet (5 mg total) by mouth 2 (two) times daily.   buPROPion 300 MG 24 hr tablet Commonly known as: WELLBUTRIN XL Take 300 mg by mouth daily.   clonazePAM 0.5 MG tablet Commonly known as: KLONOPIN Take 0.5 tablets (0.25 mg total) by mouth 3 (three) times daily as needed for anxiety. What changed:   medication strength  how much to take  when to take this   diltiazem 300 MG 24 hr capsule Commonly known as: CARDIZEM CD Take 1 capsule (300 mg total) by mouth daily.   FLUoxetine 20 MG capsule Commonly known as: PROZAC Take 20 mg by mouth at bedtime.   furosemide 20 MG tablet Commonly known as: LASIX Take 1 tablet (20 mg total) by mouth 2 (two) times daily.  gabapentin 300 MG capsule Commonly known as: NEURONTIN Take 1 capsule (300 mg total) by mouth 2 (two) times daily. Replaces: gabapentin 600 MG tablet   metoprolol succinate 25 MG 24 hr tablet Commonly known as: TOPROL-XL Take 1 tablet (25 mg total) by mouth at bedtime.   OLANZapine 10 MG tablet Commonly known as: ZYPREXA Take 10 mg by mouth at bedtime.   potassium chloride SA 20 MEQ tablet Commonly known as: KLOR-CON Take 2 tablets by mouth for the next 2  days then 1 tablet daily What changed:   how much to take  how to take this  when to take this  additional instructions   Voltaren 1 % Gel Generic drug: diclofenac Sodium Apply 2 g topically daily as needed (pain).        SignedBonnell Public 10/28/2019, 10:46 AM

## 2019-10-28 NOTE — Progress Notes (Signed)
Nurse reviewed discharge instructions with pt.  Pt verbalized understanding of discharge instructions, follow up appointments and new medications.  Pt very anxious to discharge.

## 2019-10-28 NOTE — Evaluation (Signed)
Physical Therapy Evaluation Patient Details Name: Tammy [name] MRN: [id number] DOB: 05/02/1961 Today's Date: 10/28/2019   History of Present Illness  Tammy [name] is a 59 y.o. female with PMH including A fib on anticoagulation, obesity with BMI of 51, depression/anxiety on benzodiazepines, chronic pain syndrome on high dose Gabapentin, urinary incontinence.She presented to the ED for evaluation after a fall at home. CT of head clear.    Clinical Impression  Tammy [name] is 59 y.o. female admitted with above HPI and diagnosis. Patient is currently limited by functional impairments below (see PT problem list). Patient lives with her husband and is independent at baseline. Patient is requesting to leave AMA this date and declined to participate in further balance testing or progress ambulation to greater distance. Acute PT will sign off at this time as pt is Mod I with transfers and supervision level with gait. Please re-consult if there is a change in medical status.     Follow Up Recommendations No PT follow up    Equipment Recommendations  None recommended by PT    Recommendations for Other Services       Precautions / Restrictions Precautions Precautions: Fall Restrictions Weight Bearing Restrictions: No      Mobility  Bed Mobility               General bed mobility comments: Pt OOb in bathroom on PT arrival  Transfers Overall transfer level: Modified independent Equipment used: None Transfers: Sit to/from Stand Sit to Stand: Modified independent (Device/Increase time)         General transfer comment: pt performed sit<> stand transfer in bathroom without assist or cues. Pt does require cues for safety wtih mobilizing with IV pole.  Ambulation/Gait Ambulation/Gait assistance: Supervision Gait Distance (Feet): 15 Feet Assistive device: IV Pole Gait Pattern/deviations: Wide base of support;Decreased stride length Gait velocity: decreased   General Gait  Details: pt with slow and unsteady gait. cues reqrueid for safety with mangement of IV pole and negotiating obstacles in hospital room. pt unsteady but no overt LOB noted. Despite encouragement to mobilize greater distance and perform balance challenges/testing pt declined to leave the room.  Stairs            Wheelchair Mobility    Modified Rankin (Stroke Patients Only)       Balance Overall balance assessment: Mild deficits observed, not formally tested   Sitting balance-Leahy Scale: Good     Standing balance support: During functional activity Standing balance-Leahy Scale: Good              Pertinent Vitals/Pain Pain Assessment: No/denies pain    Home Living Family/patient expects to be discharged to:: Private residence Living Arrangements: Spouse/significant other Available Help at Discharge: Family;Available 24 hours/day Type of Home: House Home Access: Stairs to enter Entrance Stairs-Rails: None Entrance Stairs-Number of Steps: 1 Home Layout: Two level Home Equipment: Walker - 2 wheels;Bedside commode;Crutches;Shower seat;Wheelchair - manual      Prior Function Level of Independence: Independent         Comments: retired Scientist, forensic for [employer]     Hand Dominance   Dominant Hand: Right    Extremity/Trunk Assessment   Upper Extremity Assessment Upper Extremity Assessment: Defer to OT evaluation    Lower Extremity Assessment Lower Extremity Assessment: Overall WFL for tasks assessed    Cervical / Trunk Assessment Cervical / Trunk Assessment: Other exceptions Cervical / Trunk Exceptions: morbid obesity  Communication   Communication: No difficulties  Cognition  Arousal/Alertness: Awake/alert Behavior During Therapy: Agitated Overall Cognitive Status: Within Functional Limits for tasks assessed          General Comments: Pt agitated that she is still in the hospital. she reports she wants to leave today stating "get the  nurse to get this IV out.Marland KitchenMarland KitchenI'm going AMA.Marland KitchenMarland KitchenI'm going home today"      General Comments      Exercises     Assessment/Plan    PT Assessment Patient needs continued PT services  PT Problem List Decreased strength;Decreased balance;Decreased mobility;Decreased knowledge of use of DME;Decreased safety awareness       PT Treatment Interventions DME instruction;Functional mobility training;Balance training;Patient/family education;Therapeutic activities;Gait training;Stair training;Therapeutic exercise    PT Goals (Current goals can be found in the Care Plan section)  Acute Rehab PT Goals Patient Stated Goal: to get home PT Goal Formulation: With patient Time For Goal Achievement: 11/04/19 Potential to Achieve Goals: Good    Frequency Other (Comment)(1 time eval/treat; pt requesting to leave AMA)    AM-PAC PT "6 Clicks" Mobility  Outcome Measure Help needed turning from your back to your side while in a flat bed without using bedrails?: None Help needed moving from lying on your back to sitting on the side of a flat bed without using bedrails?: None Help needed moving to and from a bed to a chair (including a wheelchair)?: A Little Help needed standing up from a chair using your arms (e.g., wheelchair or bedside chair)?: A Little Help needed to walk in hospital room?: A Little Help needed climbing 3-5 steps with a railing? : A Little 6 Click Score: 20    End of Session Equipment Utilized During Treatment: Gait belt Activity Tolerance: Patient tolerated treatment well Patient left: with call bell/phone within reach;in chair Nurse Communication: Mobility status;Other (comment)(Pt requesting to be discharged AMA) PT Visit Diagnosis: Muscle weakness (generalized) (M62.81);Difficulty in walking, not elsewhere classified (R26.2)    Time: [id number] PT Time Calculation (min) (ACUTE ONLY): 20 min   Charges:   PT Evaluation $PT Eval Low Complexity: 1 Low          Verner Mould, DPT Physical Therapist with [facility] [phone]  10/28/2019 11:26 AM

## 2019-10-29 ENCOUNTER — Telehealth: Payer: Self-pay | Admitting: *Deleted

## 2019-10-29 LAB — URINE CULTURE: Culture: >=100000 — AB

## 2019-10-29 NOTE — Telephone Encounter (Signed)
Faxed PA request for CPAP titration Scheduled for 11/09/19. This is a new insurance for patient.

## 2019-10-31 LAB — URINE DRUGS OF ABUSE SCREEN W ALC, ROUTINE (REF LAB)
Amphetamines, Urine: NEGATIVE ng/mL
Barbiturate, Ur: NEGATIVE ng/mL
Cannabinoid Quant, Ur: NEGATIVE ng/mL
Cocaine (Metab.): NEGATIVE ng/mL
Ethanol U, Quan: NEGATIVE %
Methadone Screen, Urine: NEGATIVE ng/mL
Phencyclidine, Ur: NEGATIVE ng/mL
Propoxyphene, Urine: NEGATIVE ng/mL

## 2019-10-31 LAB — DRUG PROFILE 799031: BENZODIAZEPINES: NEGATIVE

## 2019-10-31 LAB — OPIATES CONFIRMATION, URINE
CODEINE: NEGATIVE
MORPHINE: POSITIVE — AB
Morphine GC/MS Conf: >3000 ng/mL
OPIATES: POSITIVE — AB

## 2019-11-01 LAB — CULTURE, BLOOD (ROUTINE X 2)
Culture: NO GROWTH
Culture: NO GROWTH
Special Requests: ADEQUATE

## 2019-11-07 ENCOUNTER — Encounter (HOSPITAL_COMMUNITY): Payer: Self-pay

## 2019-11-07 ENCOUNTER — Other Ambulatory Visit: Payer: Self-pay

## 2019-11-07 ENCOUNTER — Other Ambulatory Visit (HOSPITAL_COMMUNITY): Payer: Self-pay | Admitting: *Deleted

## 2019-11-07 ENCOUNTER — Ambulatory Visit (HOSPITAL_COMMUNITY)
Admission: EM | Admit: 2019-11-07 | Discharge: 2019-11-07 | Disposition: A | Discharge status: Home / Self Care | Payer: 59 | Attending: Family Medicine | Admitting: Family Medicine

## 2019-11-07 ENCOUNTER — Ambulatory Visit (INDEPENDENT_AMBULATORY_CARE_PROVIDER_SITE_OTHER): Payer: 59

## 2019-11-07 ENCOUNTER — Other Ambulatory Visit (HOSPITAL_COMMUNITY): Payer: Medicaid Other | Admitting: Nurse Practitioner

## 2019-11-07 ENCOUNTER — Other Ambulatory Visit (HOSPITAL_COMMUNITY)
Admission: RE | Admit: 2019-11-07 | Discharge: 2019-11-07 | Disposition: A | Discharge status: Home / Self Care | Payer: 59 | Source: Ambulatory Visit | Attending: Cardiovascular Disease | Admitting: Cardiovascular Disease

## 2019-11-07 DIAGNOSIS — R0781 Pleurodynia: Secondary | ICD-10-CM | POA: Diagnosis not present

## 2019-11-07 DIAGNOSIS — R0602 Shortness of breath: Secondary | ICD-10-CM

## 2019-11-07 DIAGNOSIS — I480 Paroxysmal atrial fibrillation: Secondary | ICD-10-CM

## 2019-11-07 DIAGNOSIS — Z20822 Contact with and (suspected) exposure to covid-19: Secondary | ICD-10-CM | POA: Diagnosis not present

## 2019-11-07 DIAGNOSIS — S20211A Contusion of right front wall of thorax, initial encounter: Secondary | ICD-10-CM | POA: Diagnosis not present

## 2019-11-07 DIAGNOSIS — E876 Hypokalemia: Secondary | ICD-10-CM

## 2019-11-07 LAB — BASIC METABOLIC PANEL
Anion gap: 11 (ref 5–15)
BUN: 14 mg/dL (ref 6–20)
CO2: 25 mmol/L (ref 22–32)
Calcium: 9.2 mg/dL (ref 8.9–10.3)
Chloride: 102 mmol/L (ref 98–111)
Creatinine, Ser: 0.91 mg/dL (ref 0.44–1.00)
GFR calc Af Amer: >60 mL/min (ref >60)
GFR calc non Af Amer: >60 mL/min (ref >60)
Glucose, Bld: 89 mg/dL (ref 70–99)
Potassium: 4.2 mmol/L (ref 3.5–5.1)
Sodium: 138 mmol/L (ref 135–145)

## 2019-11-07 LAB — SARS CORONAVIRUS 2 (TAT 6-24 HRS): SARS Coronavirus 2: NEGATIVE

## 2019-11-07 MED ORDER — HYDROCODONE-ACETAMINOPHEN 5-325 MG PO TABS: 1.0000 | ORAL_TABLET | Freq: Four times a day (QID) | ORAL | 0 refills | Status: DC | PRN

## 2019-11-07 NOTE — ED Triage Notes (Addendum)
Pt reports having right sided rig cage pain and SOB after she tripped and feel over her right side 3 days ago. Pt reports she [place] drowsy because of the Klonopin.

## 2019-11-07 NOTE — Discharge Instructions (Addendum)
Be aware, pain medications may cause drowsiness. Please do not drive, operate heavy machinery or make important decisions while on this medication, it can cloud your judgement.  Do not take these with Klonopin.

## 2019-11-08 NOTE — ED Provider Notes (Signed)
[place]   [id number] 11/07/19 Arrival Time: [id number]  ASSESSMENT & PLAN:  1. Rib contusion, right, initial encounter   2. SOB (shortness of breath)   3. Hypokalemia     See AVS for d/c information.  I have personally viewed the imaging studies ordered this visit. No rib fractures or pneumothorax.   Meds ordered this encounter  Medications  . HYDROcodone-acetaminophen (NORCO/VICODIN) 5-325 MG tablet    Sig: Take 1 tablet by mouth every 6 (six) hours as needed for moderate pain or severe pain.    Dispense:  10 tablet    Refill:  0   With h/o hypokalemia, BMP drawn and is pending.  [place] Controlled Substances Registry consulted for this patient. I feel the risk/benefit ratio today is favorable for proceeding with this prescription for a controlled substance. Medication sedation precautions given.   To ED should her she feel worsening SOB or increasing pain. Reviewed expectations re: course of current medical issues. Questions answered. Outlined signs and symptoms indicating need for more acute intervention. Patient verbalized understanding. After Visit Summary given.   SUBJECTIVE:  History from: patient. Tammy [name] is a 59 y.o. female who presents with complaint of right-side rib pain after falling onto her right side 3 d ago. Continued dull ache. Occasional feeling of SOB, esp when pain flares. No SOB at rest or currently. No assoc n/v. Ambulatory without difficulty. Tylenol without much relief. No specific chest or back pain reported. Normal bowel/bladder habits. Denies: fatigue, irregular heart beat, lower extremity edema, near-syncope, orthopnea, palpitations, paroxysmal nocturnal dyspnea and syncope. Aggravating factors: include certain movements. Alleviating factors: sitting still. Recent illnesses: none. Fever: absent. Illicit drug use: none.  Social History   Tobacco Use  Smoking Status Never Smoker  Smokeless Tobacco Never Used   Social History    Substance and Sexual Activity  Alcohol Use No   Comment: drinks daily - none since 08/2012 per pt.      OBJECTIVE:  Vitals:   11/07/19 1433  BP: 105/72  Pulse: 65  Resp: 20  Temp: 98 F (36.7 C)  TempSrc: Oral  SpO2: 97%    General appearance: alert, oriented, no acute distress Eyes: PERRLA; EOMI; conjunctivae normal HENT: normocephalic; atraumatic Neck: supple with FROM Lungs: without labored respirations; speaks full sentences without difficulty; CTAB Heart: regular Chest Wall: tender over R lateral ribs without bruising or gross abnormality Abdomen: soft, non-tender; no guarding or rebound tenderness Extremities: without edema; Skin: warm and dry; without rash or lesions Neuro: normal gait Psychological: alert and cooperative; normal mood and affect  Labs:  Labs Reviewed  BASIC METABOLIC PANEL    Imaging: DG Ribs Unilateral W/Chest Right  Result Date: 11/07/2019 CLINICAL DATA:  Trauma, tripped over root, RIGHT-side rib pain, increased pain with inspiration, limited range of motion RIGHT arm, slight shortness of breath EXAM: RIGHT RIBS AND CHEST - 3+ VIEW COMPARISON:  Chest radiograph 10/26/2019 FINDINGS: Upper normal heart size. Mediastinal contours and pulmonary vascularity normal. Mild bronchitic changes without pulmonary infiltrate, pleural effusion or pneumothorax. Bones appear demineralized. BBs placed at multiple sites of symptoms RIGHT chest wall. No definite rib fracture or bone destruction. IMPRESSION: Mild bronchitic changes without infiltrate. No definite acute RIGHT rib abnormalities. Electronically Signed   By: Lavonia Dana M.D.   On: 11/07/2019 15:25     Allergies  Allergen Reactions  . Nsaids Other (See Comments)    Post Gastric Bypass  . Adhesive [Tape] Itching and Rash  . Latex Itching and Rash  .  Sulfa Antibiotics Itching and Rash    Past Medical History:  Diagnosis Date  . Anxiety   . Arthritis    neck and lower back - deg disc disease,  fingers  . Depression   . Frequency of urination   . GERD (gastroesophageal reflux disease)   . History of seizure per pt no seizure since    11-07-2015  in setting of stress and sleep deprivation  /  negative work-up by neurologist unknown idiology  . Hypothyroidism   . Injury of thumb, left    03-31-2016    . Pelvic relaxation   . SUI (stress urinary incontinence, female)    Social History   Socioeconomic History  . Marital status: Married    Spouse name: Troylynn Maloy  . Number of children: 3  . Years of education: 79  . Highest education level: Not on file  Occupational History  . Occupation: [employer]  Tobacco Use  . Smoking status: Never Smoker  . Smokeless tobacco: Never Used  Substance and Sexual Activity  . Alcohol use: No    Comment: drinks daily - none since 08/2012 per pt.  . Drug use: No  . Sexual activity: Yes    Birth control/protection: Surgical  Other Topics Concern  . Not on file  Social History Narrative   Lives with spouse   Caffeine use: 2-3 cups per day   Social Determinants of Health   Financial Resource Strain:   . Difficulty of Paying Living Expenses: Not on file  Food Insecurity:   . Worried About Charity fundraiser in the Last Year: Not on file  . Ran Out of Food in the Last Year: Not on file  Transportation Needs:   . Lack of Transportation (Medical): Not on file  . Lack of Transportation (Non-Medical): Not on file  Physical Activity:   . Days of Exercise per Week: Not on file  . Minutes of Exercise per Session: Not on file  Stress:   . Feeling of Stress : Not on file  Social Connections:   . Frequency of Communication with Friends and Family: Not on file  . Frequency of Social Gatherings with Friends and Family: Not on file  . Attends Religious Services: Not on file  . Active Member of Clubs or Organizations: Not on file  . Attends Archivist Meetings: Not on file  . Marital Status: Not on file  Intimate Partner  Violence:   . Fear of Current or Ex-Partner: Not on file  . Emotionally Abused: Not on file  . Physically Abused: Not on file  . Sexually Abused: Not on file   Family History  Problem Relation Age of Onset  . Colon polyps Mother   . Heart disease Father 3       CABG  . Breast cancer Maternal Aunt   . Stomach cancer Maternal Grandmother   . Diabetes Maternal Grandmother   . Heart disease Maternal Grandmother   . Stomach cancer Maternal Uncle   . Colon cancer Maternal Uncle        x3  . Diabetes Maternal Aunt        Type I  . Diabetes Maternal Uncle        x5 - Type II  . Heart disease Paternal Grandmother   . Esophageal cancer Neg Hx   . Gallbladder disease Neg Hx   . Seizures Neg Hx    Past Surgical History:  Procedure Laterality Date  . ANTERIOR AND POSTERIOR  REPAIR  09/25/2012   Procedure: ANTERIOR (CYSTOCELE) AND POSTERIOR REPAIR (RECTOCELE);  Surgeon: Lahoma Crocker, MD;  Location: [facility];  Service: Gynecology;  Laterality: N/A;  . ANTERIOR AND POSTERIOR REPAIR WITH SACROSPINOUS FIXATION N/A 04/12/2016   Procedure: ANTERIOR AND POSTERIOR REPAIR WITH SACROSPINOUS LIGAMENT SUSPENSION, CYSTOSCOPY;  Surgeon: Arvella Nigh, MD;  Location: [facility];  Service: Gynecology;  Laterality: N/A;  . APPENDECTOMY  1987  . BREAST EXCISIONAL BIOPSY Left    No scar visable   . BREAST REDUCTION SURGERY  2000  aprrox  . ENDOMETRIAL ABLATION W/ NOVASURE  2011   in office  . EXCISION LEFT BREAST BX  12-07-1999   benign  . LAPAROSCOPIC CHOLECYSTECTOMY  02-09-2005  . LAPAROSCOPIC VAGINAL HYSTERECTOMY WITH SALPINGO OOPHORECTOMY Bilateral 04/12/2016   Procedure: LAPAROSCOPIC ASSISTED VAGINAL HYSTERECTOMY WITH SALPINGO OOPHORECTOMY;  Surgeon: Arvella Nigh, MD;  Location: [facility];  Service: Gynecology;  Laterality: Bilateral;  . REDUCTION MAMMAPLASTY Bilateral   . ROUX-EN-Y GASTRIC BYPASS  06/  2002  . TUBAL LIGATION  10/ 1990     Vanessa Kick,  MD 11/08/19 [phone]

## 2019-11-09 ENCOUNTER — Other Ambulatory Visit: Payer: Self-pay

## 2019-11-09 ENCOUNTER — Ambulatory Visit (HOSPITAL_BASED_OUTPATIENT_CLINIC_OR_DEPARTMENT_OTHER): Payer: 59 | Admitting: Cardiovascular Disease

## 2019-11-14 ENCOUNTER — Other Ambulatory Visit (HOSPITAL_COMMUNITY)
Admission: RE | Admit: 2019-11-14 | Discharge: 2019-11-14 | Disposition: A | Discharge status: Home / Self Care | Payer: 59 | Source: Ambulatory Visit | Attending: Cardiovascular Disease | Admitting: Cardiovascular Disease

## 2019-11-14 DIAGNOSIS — Z01812 Encounter for preprocedural laboratory examination: Secondary | ICD-10-CM | POA: Diagnosis present

## 2019-11-14 DIAGNOSIS — Z20822 Contact with and (suspected) exposure to covid-19: Secondary | ICD-10-CM | POA: Diagnosis not present

## 2019-11-14 LAB — SARS CORONAVIRUS 2 (TAT 6-24 HRS): SARS Coronavirus 2: NEGATIVE

## 2019-11-15 ENCOUNTER — Other Ambulatory Visit (HOSPITAL_COMMUNITY): Payer: Self-pay | Admitting: Nurse Practitioner

## 2019-11-16 ENCOUNTER — Other Ambulatory Visit: Payer: Self-pay

## 2019-11-16 ENCOUNTER — Ambulatory Visit (HOSPITAL_BASED_OUTPATIENT_CLINIC_OR_DEPARTMENT_OTHER): Payer: 59 | Attending: Cardiovascular Disease | Admitting: Cardiovascular Disease

## 2019-11-16 DIAGNOSIS — G4733 Obstructive sleep apnea (adult) (pediatric): Secondary | ICD-10-CM | POA: Insufficient documentation

## 2019-11-16 DIAGNOSIS — I4891 Unspecified atrial fibrillation: Secondary | ICD-10-CM | POA: Diagnosis not present

## 2019-11-19 ENCOUNTER — Other Ambulatory Visit: Payer: Self-pay

## 2019-12-02 NOTE — Procedures (Signed)
Patient Name: Tammy [name], Meer Date: 11/16/2019 Gender: Female D.O.B: Feb 08, 1961 Age (years): 13 Referring Provider: Shelva Majestic MD, ABSM Height (inches): 66 Interpreting Physician: Shelva Majestic MD, ABSM Weight (lbs): 306 RPSGT: Earney Hamburg BMI: 86 MRN: [id number] Neck Size: 14.50  CLINICAL INFORMATION The patient is referred for a CPAP titration to treat sleep apnea.  Date of NPSG: 10/15/2019: AHI 11.8/h; RDI 51.0/h; absence of REM sleep; loud snoring.  SLEEP STUDY TECHNIQUE As per the AASM Manual for the Scoring of Sleep and Associated Events v2.3 (April 2016) with a hypopnea requiring 4% desaturations.  The channels recorded and monitored were frontal, central and occipital EEG, electrooculogram (EOG), submentalis EMG (chin), nasal and oral airflow, thoracic and abdominal wall motion, anterior tibialis EMG, snore microphone, electrocardiogram, and pulse oximetry. Continuous positive airway pressure (CPAP) was initiated at the beginning of the study and titrated to treat sleep-disordered breathing.  MEDICATIONS acetaminophen (TYLENOL) 325 MG tablet  apixaban (ELIQUIS) 5 MG TABS tablet(Expired)  buPROPion (WELLBUTRIN XL) 300 MG 24 hr tablet  clonazePAM (KLONOPIN) 0.5 MG tablet  diclofenac Sodium (VOLTAREN) 1 % GEL  diltiazem (CARDIZEM CD) 300 MG 24 hr capsule(Expired)  FLUoxetine (PROZAC) 20 MG capsule  furosemide (LASIX) 20 MG tablet  gabapentin (NEURONTIN) 300 MG capsule  HYDROcodone-acetaminophen (NORCO/VICODIN) 5-325 MG tablet  metoprolol succinate (TOPROL-XL) 25 MG 24 hr tablet  OLANZapine (ZYPREXA) 10 MG tablet  potassium chloride SA (KLOR-CON M20) 20 MEQ tablet  Medications self-administered by patient taken the night of the study : N/A  TECHNICIAN COMMENTS Comments added by technician: Patient had difficulty initiating sleep. Comments added by scorer: N/A RESPIRATORY PARAMETERS Optimal PAP Pressure (cm): 9 AHI at Optimal Pressure  (/hr): 0.0 Overall Minimal O2 (%): 90.0 Supine % at Optimal Pressure (%): 0 Minimal O2 at Optimal Pressure (%): 90.0   SLEEP ARCHITECTURE The study was initiated at 10:15:57 PM and ended at 4:20:38 AM.  Sleep onset time was 71.9 minutes and the sleep efficiency was 69.6%%. The total sleep time was 254 minutes.  The patient spent 1.6%% of the night in stage N1 sleep, 85.0%% in stage N2 sleep, 0.4%% in stage N3 and 13% in REM.Stage REM latency was 212.0 minutes  Wake after sleep onset was 38.8. Alpha intrusion was absent. Supine sleep was 0.00%.  CARDIAC DATA The 2 lead EKG demonstrated sinus rhythm. The mean heart rate was 67.9 beats per minute. Other EKG findings include: rare isolated PVCs.  LEG MOVEMENT DATA The total Periodic Limb Movements of Sleep (PLMS) were 0. The PLMS index was 0.0. A PLMS index of <15 is considered normal in adults.  IMPRESSIONS - CPAP was initiated at 5 cm and was titrated to optimal PAP pressure at 9 cm of water: AHI 0; O2 nadir 90%. - Central sleep apnea was not noted during this titration (CAI = 0.0/h). - Significant oxygen desaturations were not observed during this titration (min O2 = 90.0%). - The patient snored with soft snoring volume during this titration study. - No cardiac abnormalities were observed during this study. - Clinically significant periodic limb movements were not noted during this study. Arousals associated with PLMs were rare.  DIAGNOSIS - Obstructive Sleep Apnea (327.23 [G47.33 ICD-10])  RECOMMENDATIONS - Recommend an initial trial of CPAP therapy at 9 cm H2O with heated humidification.  A Small size Fisher&Paykel Nasal Mask Eson mask was used for the titration. - Avoid alcohol, sedatives and other CNS depressants that may worsen sleep apnea and disrupt normal  sleep architecture. - Sleep hygiene should be reviewed to assess factors that may improve sleep quality. - Weight management and regular exercise should be initiated or  continued. - Recommend a download in 30 days and sleep clinic evaluation after 4 weeks of therapy   [Electronically signed] 12/02/2019 11:57 AM  Shelva Majestic MD, [facility], ABSM Diplomate, American Board of Sleep Medicine   NPI: [id number]  [place] PH: [phone]   FX: [phone] [place]

## 2019-12-10 ENCOUNTER — Telehealth: Payer: Self-pay | Admitting: *Deleted

## 2019-12-10 NOTE — Telephone Encounter (Signed)
Patient notified sleep study has been completed. Order for CPAP machine will be sent to whichever [employer] company accepts her insurance.

## 2019-12-15 ENCOUNTER — Other Ambulatory Visit (HOSPITAL_COMMUNITY): Payer: Self-pay | Admitting: Nurse Practitioner

## 2019-12-18 ENCOUNTER — Telehealth: Payer: Self-pay | Admitting: *Deleted

## 2019-12-18 NOTE — Telephone Encounter (Signed)
Received a phone call from Ivin Booty at [employer] informing me that the only [employer] company that excepts the patients insurance is [employer]. She will forward the order to them.

## 2020-01-08 ENCOUNTER — Ambulatory Visit (INDEPENDENT_AMBULATORY_CARE_PROVIDER_SITE_OTHER): Payer: 59 | Admitting: Orthopaedic Surgery

## 2020-01-08 ENCOUNTER — Encounter: Payer: Self-pay | Admitting: Orthopaedic Surgery

## 2020-01-08 ENCOUNTER — Ambulatory Visit: Payer: Self-pay

## 2020-01-08 ENCOUNTER — Ambulatory Visit (INDEPENDENT_AMBULATORY_CARE_PROVIDER_SITE_OTHER): Payer: 59

## 2020-01-08 ENCOUNTER — Other Ambulatory Visit: Payer: Self-pay

## 2020-01-08 DIAGNOSIS — M25561 Pain in right knee: Secondary | ICD-10-CM

## 2020-01-08 DIAGNOSIS — M25562 Pain in left knee: Secondary | ICD-10-CM

## 2020-01-08 DIAGNOSIS — M722 Plantar fascial fibromatosis: Secondary | ICD-10-CM | POA: Diagnosis not present

## 2020-01-08 DIAGNOSIS — G8929 Other chronic pain: Secondary | ICD-10-CM

## 2020-01-08 MED ORDER — METHYLPREDNISOLONE ACETATE 40 MG/ML IJ SUSP
40.0000 mg | INTRAMUSCULAR | Status: AC | PRN
  Administered 2020-01-08: 16:00:00 40 mg via INTRA_ARTICULAR

## 2020-01-08 MED ORDER — METHYLPREDNISOLONE ACETATE 40 MG/ML IJ SUSP
40.0000 mg | INTRAMUSCULAR | Status: AC | PRN
  Administered 2020-01-08: 40 mg via INTRA_ARTICULAR

## 2020-01-08 MED ORDER — LIDOCAINE HCL 1 % IJ SOLN
2.0000 mL | INTRAMUSCULAR | Status: AC | PRN
  Administered 2020-01-08: 2 mL

## 2020-01-08 MED ORDER — PENNSAID 2 % EX SOLN: 2.0000 g | Freq: Two times a day (BID) | CUTANEOUS | 3 refills | Status: DC | PRN

## 2020-01-08 MED ORDER — BUPIVACAINE HCL 0.5 % IJ SOLN
2.0000 mL | INTRAMUSCULAR | Status: AC | PRN
  Administered 2020-01-08: 2 mL via INTRA_ARTICULAR

## 2020-01-08 NOTE — Progress Notes (Signed)
Office Visit Note   Patient: Tammy [name]           Date of Birth: 1960-10-15           MRN: [id number] Visit Date: 01/08/2020              Requested by: Fanny Bien, [address] [place],  [place] [address] PCP: Fanny Bien, MD   Assessment & Plan: Visit Diagnoses:  1. Chronic pain of both knees   2. Plantar fasciitis, bilateral     Plan: Impression is bilateral knee pain due to mild osteoarthritis and morbid obesity and bilateral plantar fasciitis.  Prescription for Pennsaid was sent in today.  Based on discussion patient would like to try bilateral knee injections today.  She tolerated this well.  Recommend spacing out the plantar fasciitis injections for another couple weeks.  Questions encouraged and answered.  Follow-up in 2 weeks for bilateral plantar fascia injections.  Disability paperwork filled out today.  Follow-Up Instructions: Return in about 2 weeks (around 01/22/2020).   Orders:  Orders Placed This Encounter  Procedures  . XR KNEE 3 VIEW RIGHT  . XR KNEE 3 VIEW LEFT   Meds ordered this encounter  Medications  . Diclofenac Sodium (PENNSAID) 2 % SOLN    Sig: Apply 2 g topically 2 (two) times daily as needed (to affected area).    Dispense:  112 g    Refill:  3      Procedures: Large Joint Inj: bilateral knee on 01/08/2020 3:51 PM Indications: pain Details: 22 G needle  Arthrogram: No  Medications (Right): 2 mL lidocaine 1 %; 2 mL bupivacaine 0.5 %; 40 mg methylPREDNISolone acetate 40 MG/ML Medications (Left): 2 mL lidocaine 1 %; 2 mL bupivacaine 0.5 %; 40 mg methylPREDNISolone acetate 40 MG/ML Outcome: tolerated well, no immediate complications Patient was prepped and draped in the usual sterile fashion.       Clinical Data: No additional findings.   Subjective: Chief Complaint  Patient presents with  . Left Knee - Pain  . Right Knee - Pain  . Left Foot - Pain  . Right Foot - Pain    Bil plantar fascia    Tammy [name] is  a 59 year old female who is well-known to me who comes in for evaluation of chronic bilateral heel and knee pain.  She has chronic plantar fasciitis and her previous injection was a year ago.  She is also having chronic bilateral knee pain that is worse with transitioning from sit to stand and using stairs.  She has been out of work for 3 years and is currently seeking long-term disability.  Denies any injuries.   Review of Systems  Constitutional: Negative.   HENT: Negative.   Eyes: Negative.   Respiratory: Negative.   Cardiovascular: Negative.   Endocrine: Negative.   Musculoskeletal: Negative.   Neurological: Negative.   Hematological: Negative.   Psychiatric/Behavioral: Negative.   All other systems reviewed and are negative.    Objective: Vital Signs: LMP 09/30/2012   Physical Exam Vitals and nursing note reviewed.  Constitutional:      Appearance: She is well-developed.  Pulmonary:     Effort: Pulmonary effort is normal.  Skin:    General: Skin is warm.     Capillary Refill: Capillary refill takes less than 2 seconds.  Neurological:     Mental Status: She is alert and oriented to person, place, and time.  Psychiatric:  Behavior: Behavior normal.        Thought Content: Thought content normal.        Judgment: Judgment normal.     Ortho Exam Bilateral knees show no joint effusion.  Mild pain with range of motion slightly restricted.  Collaterals cruciates are stable.  Bilateral feet reveal tenderness at the insertion of the plantar fascia.  Mild Achilles contracture. Specialty Comments:  No specialty comments available.  Imaging: XR KNEE 3 VIEW LEFT  Result Date: 01/08/2020 Mild osteoarthritis.  XR KNEE 3 VIEW RIGHT  Result Date: 01/08/2020 Mild osteoarthritis    PMFS History: Patient Active Problem List   Diagnosis Date Noted  . Chronic pain of both knees 01/08/2020  . Encephalopathy acute 10/27/2019  . Acute encephalopathy 10/27/2019  . CAP  (community acquired pneumonia) 07/31/2019  . Atrial fibrillation with RVR ([facility]) 07/31/2019  . Morbid obesity with BMI of 50.0-59.9, adult ([facility]) 07/31/2019  . Community acquired pneumonia 04/14/2019  . Closed nondisplaced fracture of head of left radius 06/20/2018  . Sprain of left wrist 06/20/2018  . Peroneal tendinitis, right leg 06/23/2017  . Trochanteric bursitis, right hip 06/23/2017  . Acute right-sided low back pain with left-sided sciatica 03/24/2017  . Pain in right hip 03/24/2017  . Impingement syndrome of left shoulder 08/30/2016  . Impingement syndrome of right shoulder 08/30/2016  . Plantar fasciitis, bilateral 08/30/2016  . Pelvic relaxation 04/13/2016    Class: Present on Admission  . S/P hysterectomy 04/12/2016  . Depression 11/16/2015  . Generalized anxiety disorder 11/16/2015  . Convulsion ([facility]) 11/16/2015  . SUI (stress urinary incontinence, female) 09/26/2012   Past Medical History:  Diagnosis Date  . Anxiety   . Arthritis    neck and lower back - deg disc disease, fingers  . Depression   . Frequency of urination   . GERD (gastroesophageal reflux disease)   . History of seizure per pt no seizure since    11-07-2015  in setting of stress and sleep deprivation  /  negative work-up by neurologist unknown idiology  . Hypothyroidism   . Injury of thumb, left    03-31-2016    . Pelvic relaxation   . SUI (stress urinary incontinence, female)     Family History  Problem Relation Age of Onset  . Colon polyps Mother   . Heart disease Father 22       CABG  . Breast cancer Maternal Aunt   . Stomach cancer Maternal Grandmother   . Diabetes Maternal Grandmother   . Heart disease Maternal Grandmother   . Stomach cancer Maternal Uncle   . Colon cancer Maternal Uncle        x3  . Diabetes Maternal Aunt        Type I  . Diabetes Maternal Uncle        x5 - Type II  . Heart disease Paternal Grandmother   . Esophageal cancer Neg Hx   . Gallbladder disease Neg Hx    . Seizures Neg Hx     Past Surgical History:  Procedure Laterality Date  . ANTERIOR AND POSTERIOR REPAIR  09/25/2012   Procedure: ANTERIOR (CYSTOCELE) AND POSTERIOR REPAIR (RECTOCELE);  Surgeon: Lahoma Crocker, MD;  Location: [facility];  Service: Gynecology;  Laterality: N/A;  . ANTERIOR AND POSTERIOR REPAIR WITH SACROSPINOUS FIXATION N/A 04/12/2016   Procedure: ANTERIOR AND POSTERIOR REPAIR WITH SACROSPINOUS LIGAMENT SUSPENSION, CYSTOSCOPY;  Surgeon: Arvella Nigh, MD;  Location: [facility];  Service: Gynecology;  Laterality: N/A;  .  APPENDECTOMY  1987  . BREAST EXCISIONAL BIOPSY Left    No scar visable   . BREAST REDUCTION SURGERY  2000  aprrox  . ENDOMETRIAL ABLATION W/ NOVASURE  2011   in office  . EXCISION LEFT BREAST BX  12-07-1999   benign  . LAPAROSCOPIC CHOLECYSTECTOMY  02-09-2005  . LAPAROSCOPIC VAGINAL HYSTERECTOMY WITH SALPINGO OOPHORECTOMY Bilateral 04/12/2016   Procedure: LAPAROSCOPIC ASSISTED VAGINAL HYSTERECTOMY WITH SALPINGO OOPHORECTOMY;  Surgeon: Arvella Nigh, MD;  Location: [facility];  Service: Gynecology;  Laterality: Bilateral;  . REDUCTION MAMMAPLASTY Bilateral   . ROUX-EN-Y GASTRIC BYPASS  06/  2002  . TUBAL LIGATION  10/ 1990   Social History   Occupational History  . Occupation: [employer]  Tobacco Use  . Smoking status: Never Smoker  . Smokeless tobacco: Never Used  Substance and Sexual Activity  . Alcohol use: No    Comment: drinks daily - none since 08/2012 per pt.  . Drug use: No  . Sexual activity: Yes    Birth control/protection: Surgical

## 2020-01-09 ENCOUNTER — Telehealth: Payer: Self-pay | Admitting: Orthopaedic Surgery

## 2020-01-09 NOTE — Telephone Encounter (Signed)
She's had this medication before.  Please let pharmacy know.  Thanks.

## 2020-01-09 NOTE — Telephone Encounter (Signed)
Pharmacy called.   According to her records, the patient has an allergy to the medication she prescribed. They are requesting a call back to clear that up.  Call back: [phone]

## 2020-01-10 NOTE — Telephone Encounter (Signed)
Spoke to pharmacist. Advised on message below.

## 2020-01-22 ENCOUNTER — Other Ambulatory Visit: Payer: Self-pay

## 2020-01-22 ENCOUNTER — Encounter (HOSPITAL_COMMUNITY): Payer: Self-pay | Admitting: Nurse Practitioner

## 2020-01-22 ENCOUNTER — Ambulatory Visit (HOSPITAL_COMMUNITY)
Admission: RE | Admit: 2020-01-22 | Discharge: 2020-01-22 | Disposition: A | Discharge status: Home / Self Care | Payer: 59 | Source: Ambulatory Visit | Attending: Nurse Practitioner | Admitting: Nurse Practitioner

## 2020-01-22 VITALS — BP 120/76 | HR 77 | Ht 66.0 in | Wt 308.6 lb

## 2020-01-22 DIAGNOSIS — N393 Stress incontinence (female) (male): Secondary | ICD-10-CM | POA: Insufficient documentation

## 2020-01-22 DIAGNOSIS — Z79899 Other long term (current) drug therapy: Secondary | ICD-10-CM | POA: Insufficient documentation

## 2020-01-22 DIAGNOSIS — I4891 Unspecified atrial fibrillation: Secondary | ICD-10-CM | POA: Insufficient documentation

## 2020-01-22 DIAGNOSIS — E039 Hypothyroidism, unspecified: Secondary | ICD-10-CM | POA: Diagnosis not present

## 2020-01-22 DIAGNOSIS — Z7901 Long term (current) use of anticoagulants: Secondary | ICD-10-CM | POA: Insufficient documentation

## 2020-01-22 DIAGNOSIS — G4733 Obstructive sleep apnea (adult) (pediatric): Secondary | ICD-10-CM | POA: Insufficient documentation

## 2020-01-22 DIAGNOSIS — F419 Anxiety disorder, unspecified: Secondary | ICD-10-CM | POA: Insufficient documentation

## 2020-01-22 DIAGNOSIS — F329 Major depressive disorder, single episode, unspecified: Secondary | ICD-10-CM | POA: Diagnosis not present

## 2020-01-22 DIAGNOSIS — I480 Paroxysmal atrial fibrillation: Secondary | ICD-10-CM | POA: Diagnosis not present

## 2020-01-22 DIAGNOSIS — I10 Essential (primary) hypertension: Secondary | ICD-10-CM | POA: Diagnosis not present

## 2020-01-22 DIAGNOSIS — D6869 Other thrombophilia: Secondary | ICD-10-CM

## 2020-01-22 NOTE — Progress Notes (Signed)
Primary Care Physician: Fanny Bien, MD Referring Physician: Dr. Nuala Alpha Tammy [name] is a 59 y.o. female with a h/o community acquired pneumonia with sepsis and acute hypoxic respiratory failure, 07/2019. She was found to have new onset atrial fibrillation. She was discharged on Cardizem and eliquis 5 mg bid for a CHA2DS2VASc score of 2.  She then had a zio patch placed with SR as predominate rhythm with 7% atrial flutter shown. She was referred to the afib clinic. Pt states that she snores with witnessed apnea. A sleep study was recommended at time of discharge. She also drinks 4 18 oz glasses of tea a day. No tobacco or alcohol use. Sedentary with a BMI of 51.10 kg. She does not really feel any arrhythmia's reported on the monitor.    F/u in afib clinic. 1/18. She  has been diagnosed with OSA and is pending cpap trial. She is  more short of breath over the last 1-2 weeks. This correlates with her running out of lasix about the same time. She  does not appreciate any significant  irregular rhythm. No bleeding issues with eliquis with a CHA2DS2VASc score of 2. She  is in SR  today but since she does not know when she is in SR or afib, for best protection against stroke with her total risk factors,  will continue eliquis.   F/u in afib clinic, 01/22/20. Pt reports that she has not had any further occurrence of afib. She stopped her daily diltiazem for unclear reasons, but has not had any further afib, BP controlled. Remains on metoprolol and eliquis for a CHA2DS2VASc score of 2. No bleeding issues. Has just started on CPAP for treatment of sleep apnea, not quite leaving it on all night yet. .   Today, she denies symptoms of palpitations, chest pain, shortness of breath, orthopnea, PND, lower extremity edema, dizziness, presyncope, syncope, or neurologic sequela. The patient is tolerating medications without difficulties and is otherwise without complaint today.   Past Medical History:   Diagnosis Date  . Anxiety   . Arthritis    neck and lower back - deg disc disease, fingers  . Depression   . Frequency of urination   . GERD (gastroesophageal reflux disease)   . History of seizure per pt no seizure since    11-07-2015  in setting of stress and sleep deprivation  /  negative work-up by neurologist unknown idiology  . Hypothyroidism   . Injury of thumb, left    03-31-2016    . Pelvic relaxation   . SUI (stress urinary incontinence, female)    Past Surgical History:  Procedure Laterality Date  . ANTERIOR AND POSTERIOR REPAIR  09/25/2012   Procedure: ANTERIOR (CYSTOCELE) AND POSTERIOR REPAIR (RECTOCELE);  Surgeon: Lahoma Crocker, MD;  Location: [facility];  Service: Gynecology;  Laterality: N/A;  . ANTERIOR AND POSTERIOR REPAIR WITH SACROSPINOUS FIXATION N/A 04/12/2016   Procedure: ANTERIOR AND POSTERIOR REPAIR WITH SACROSPINOUS LIGAMENT SUSPENSION, CYSTOSCOPY;  Surgeon: Arvella Nigh, MD;  Location: [facility];  Service: Gynecology;  Laterality: N/A;  . APPENDECTOMY  1987  . BREAST EXCISIONAL BIOPSY Left    No scar visable   . BREAST REDUCTION SURGERY  2000  aprrox  . ENDOMETRIAL ABLATION W/ NOVASURE  2011   in office  . EXCISION LEFT BREAST BX  12-07-1999   benign  . LAPAROSCOPIC CHOLECYSTECTOMY  02-09-2005  . LAPAROSCOPIC VAGINAL HYSTERECTOMY WITH SALPINGO OOPHORECTOMY Bilateral 04/12/2016   Procedure: LAPAROSCOPIC ASSISTED  VAGINAL HYSTERECTOMY WITH SALPINGO OOPHORECTOMY;  Surgeon: Arvella Nigh, MD;  Location: [facility];  Service: Gynecology;  Laterality: Bilateral;  . REDUCTION MAMMAPLASTY Bilateral   . ROUX-EN-Y GASTRIC BYPASS  06/  2002  . TUBAL LIGATION  10/ 1990    Current Outpatient Medications  Medication Sig Dispense Refill  . apixaban (ELIQUIS) 5 MG TABS tablet Take 1 tablet (5 mg total) by mouth 2 (two) times daily. 180 tablet 1  . buPROPion (WELLBUTRIN XL) 150 MG 24 hr tablet Take 450 mg by mouth daily.    Marland Kitchen buPROPion  (WELLBUTRIN XL) 300 MG 24 hr tablet Take 300 mg by mouth daily.   10  . clonazePAM (KLONOPIN) 0.5 MG tablet Take 0.5 tablets (0.25 mg total) by mouth 3 (three) times daily as needed for anxiety. 10 tablet 0  . Diclofenac Sodium (PENNSAID) 2 % SOLN Apply 2 g topically 2 (two) times daily as needed (to affected area). 112 g 3  . diclofenac Sodium (VOLTAREN) 1 % GEL Apply 2 g topically daily as needed (pain).     Marland Kitchen diltiazem (CARDIZEM CD) 300 MG 24 hr capsule Take 1 capsule (300 mg total) by mouth daily. 90 capsule 1  . FLUoxetine (PROZAC) 20 MG capsule Take 20 mg by mouth at bedtime.   11  . furosemide (LASIX) 20 MG tablet Take 1 tablet (20 mg total) by mouth 2 (two) times daily. 60 tablet 3  . gabapentin (NEURONTIN) 300 MG capsule Take 1 capsule (300 mg total) by mouth 2 (two) times daily. 60 capsule 0  . metoprolol succinate (TOPROL-XL) 25 MG 24 hr tablet TAKE 1 TABLET BY MOUTH AT BEDTIME 30 tablet 1  . OLANZapine (ZYPREXA) 10 MG tablet Take 10 mg by mouth at bedtime.    . potassium chloride SA (KLOR-CON M20) 20 MEQ tablet Take 1 tablet (20 mEq total) by mouth daily. 30 tablet 6   No current facility-administered medications for this encounter.    Allergies  Allergen Reactions  . Nsaids Other (See Comments)    Post Gastric Bypass  . Adhesive [Tape] Itching and Rash  . Latex Itching and Rash  . Sulfa Antibiotics Itching and Rash    Social History   Socioeconomic History  . Marital status: Married    Spouse name: Anamaria Dusenbury  . Number of children: 3  . Years of education: 78  . Highest education level: Not on file  Occupational History  . Occupation: [employer]  Tobacco Use  . Smoking status: Never Smoker  . Smokeless tobacco: Never Used  Substance and Sexual Activity  . Alcohol use: No    Comment: drinks daily - none since 08/2012 per pt.  . Drug use: No  . Sexual activity: Yes    Birth control/protection: Surgical  Other Topics Concern  . Not on file  Social History  Narrative   Lives with spouse   Caffeine use: 2-3 cups per day   Social Determinants of Health   Financial Resource Strain:   . Difficulty of Paying Living Expenses:   Food Insecurity:   . Worried About Charity fundraiser in the Last Year:   . Arboriculturist in the Last Year:   Transportation Needs:   . Film/video editor (Medical):   Marland Kitchen Lack of Transportation (Non-Medical):   Physical Activity:   . Days of Exercise per Week:   . Minutes of Exercise per Session:   Stress:   . Feeling of Stress :  Social Connections:   . Frequency of Communication with Friends and Family:   . Frequency of Social Gatherings with Friends and Family:   . Attends Religious Services:   . Active Member of Clubs or Organizations:   . Attends Archivist Meetings:   Marland Kitchen Marital Status:   Intimate Partner Violence:   . Fear of Current or Ex-Partner:   . Emotionally Abused:   Marland Kitchen Physically Abused:   . Sexually Abused:     Family History  Problem Relation Age of Onset  . Colon polyps Mother   . Heart disease Father 71       CABG  . Breast cancer Maternal Aunt   . Stomach cancer Maternal Grandmother   . Diabetes Maternal Grandmother   . Heart disease Maternal Grandmother   . Stomach cancer Maternal Uncle   . Colon cancer Maternal Uncle        x3  . Diabetes Maternal Aunt        Type I  . Diabetes Maternal Uncle        x5 - Type II  . Heart disease Paternal Grandmother   . Esophageal cancer Neg Hx   . Gallbladder disease Neg Hx   . Seizures Neg Hx     ROS- All systems are reviewed and negative except as per the HPI above  Physical Exam: There were no vitals filed for this visit. Wt Readings from Last 3 Encounters:  11/16/19 (!) 139.3 kg  10/26/19 (!) 140.6 kg  10/22/19 (!) 139.6 kg    Labs: Lab Results  Component Value Date   NA 138 11/07/2019   K 4.2 11/07/2019   CL 102 11/07/2019   CO2 25 11/07/2019   GLUCOSE 89 11/07/2019   BUN 14 11/07/2019   CREATININE 0.91  11/07/2019   CALCIUM 9.2 11/07/2019   PHOS 3.5 10/28/2019   MG 2.5 (H) 10/28/2019   Lab Results  Component Value Date   INR 1.1 07/30/2019   Lab Results  Component Value Date   CHOL 160 08/01/2019   HDL 47 08/01/2019   LDLCALC 98 08/01/2019   TRIG 77 08/01/2019     GEN- The patient is well appearing, alert and oriented x 3 today.   Head- normocephalic, atraumatic Eyes-  Sclera clear, conjunctiva pink Ears- hearing intact Oropharynx- clear Neck- supple, no JVP Lymph- no cervical lymphadenopathy Lungs- Clear to ausculation bilaterally, normal work of breathing Heart- Regular rate and rhythm, no murmurs, rubs or gallops, PMI not laterally displaced GI- soft, NT, ND, + BS Extremities- no clubbing, cyanosis, or edema MS- no significant deformity or atrophy Skin- no rash or lesion Psych- euthymic mood, full affect Neuro- strength and sensation are intact  EKG-NSR, 77 bpm, qrs int 84 ms, qtc 448 md  Zio patch-Patient had a min HR of 53 bpm, max HR of 250 bpm, and avg HR of 80 bpm. Predominant underlying rhythm was Sinus Rhythm. 1 run of Ventricular Tachycardia occurred lasting 5 beats with a max rate of 113 bpm (avg 109 bpm). Atrial Flutter occurred (7% burden), ranging from 69-250 bpm (avg of 113 bpm), the longest lasting 4 hours 43 mins with an avg rate of 118 bpm. Isolated SVEs were rare (<1.0%), SVE Couplets were rare (<1.0%), and SVE Triplets were rare (<1.0%). Isolated VEs were rare (<1.0%), and no VE Couplets or VE Triplets were present. MD notification criteria for Rapid Atrial Flutter met - report posted prior to notification per account request (MW).  Echo- IMPRESSIONS  1. Left ventricular ejection fraction, by visual estimation, is 60 to 65%. The left ventricle has normal function. Left ventricular septal wall thickness was mildly increased. Normal left ventricular posterior wall thickness. There is mildly increased  left ventricular hypertrophy.  2. Global  right ventricle has normal systolic function.The right ventricular size is mildly enlarged. No increase in right ventricular wall thickness.  3. Left atrial size was moderately dilated.  4. Right atrial size was moderately dilated.  5. The mitral valve is normal in structure. No evidence of mitral valve regurgitation.  6. The tricuspid valve is normal in structure. Tricuspid valve regurgitation is trivial.  7. The aortic valve is normal in structure. Aortic valve regurgitation was not visualized by color flow Doppler.  8. The pulmonic valve was normal in structure. Pulmonic valve regurgitation is not visualized by color flow Doppler.  9. Mildly elevated pulmonary artery systolic pressure. 10. The inferior vena cava is dilated in size with <50% respiratory variability, suggesting right atrial pressure of 15 mmHg.   Assessment and Plan: 1. New onset afib in the setting of Pneumonia  Afib noted in the hospital, atrial flutter on zio patch  Pt has not noted any further afib  She has stopped Cardizem 300 mg daily thru unclear issues Continue metoprolol succinate 25 mg at hs Lasix at 20 mg bid   2. Lifestyle issues  Recent  sleep study positive,just started using CPAP  Minimize caffeine intake Regular exercise and weight loss encouraged   3. CHA2DS2VASc score of 2 ( per hospital records for sex and probable undiagnosed HTN) Continue eliquis 5 mg bid   4. HTN Stable today   Will f/u with pt as needed    Butch Penny C. Carroll, [facility] [address] [place], [place] [address] [phone]

## 2020-02-05 ENCOUNTER — Other Ambulatory Visit (HOSPITAL_COMMUNITY): Payer: Self-pay | Admitting: Nurse Practitioner

## 2020-02-11 ENCOUNTER — Other Ambulatory Visit (HOSPITAL_COMMUNITY): Payer: Self-pay | Admitting: Nurse Practitioner

## 2020-02-19 ENCOUNTER — Other Ambulatory Visit: Payer: Self-pay

## 2020-02-19 ENCOUNTER — Inpatient Hospital Stay (HOSPITAL_COMMUNITY)
Admission: EM | Admit: 2020-02-19 | Discharge: 2020-02-20 | DRG: 871 | Discharge status: Against Medical Advice | Payer: 59 | Attending: Internal Medicine | Admitting: Internal Medicine

## 2020-02-19 DIAGNOSIS — Z8249 Family history of ischemic heart disease and other diseases of the circulatory system: Secondary | ICD-10-CM

## 2020-02-19 DIAGNOSIS — I480 Paroxysmal atrial fibrillation: Secondary | ICD-10-CM | POA: Diagnosis present

## 2020-02-19 DIAGNOSIS — Z90710 Acquired absence of both cervix and uterus: Secondary | ICD-10-CM

## 2020-02-19 DIAGNOSIS — J9601 Acute respiratory failure with hypoxia: Secondary | ICD-10-CM | POA: Diagnosis present

## 2020-02-19 DIAGNOSIS — Z9049 Acquired absence of other specified parts of digestive tract: Secondary | ICD-10-CM

## 2020-02-19 DIAGNOSIS — Z9884 Bariatric surgery status: Secondary | ICD-10-CM

## 2020-02-19 DIAGNOSIS — Z5329 Procedure and treatment not carried out because of patient's decision for other reasons: Secondary | ICD-10-CM | POA: Diagnosis not present

## 2020-02-19 DIAGNOSIS — F32A Depression, unspecified: Secondary | ICD-10-CM | POA: Diagnosis present

## 2020-02-19 DIAGNOSIS — J9690 Respiratory failure, unspecified, unspecified whether with hypoxia or hypercapnia: Secondary | ICD-10-CM

## 2020-02-19 DIAGNOSIS — K219 Gastro-esophageal reflux disease without esophagitis: Secondary | ICD-10-CM | POA: Diagnosis present

## 2020-02-19 DIAGNOSIS — I4891 Unspecified atrial fibrillation: Secondary | ICD-10-CM | POA: Diagnosis present

## 2020-02-19 DIAGNOSIS — D509 Iron deficiency anemia, unspecified: Secondary | ICD-10-CM | POA: Diagnosis present

## 2020-02-19 DIAGNOSIS — E872 Acidosis, unspecified: Secondary | ICD-10-CM | POA: Diagnosis present

## 2020-02-19 DIAGNOSIS — Z8371 Family history of colonic polyps: Secondary | ICD-10-CM

## 2020-02-19 DIAGNOSIS — K567 Ileus, unspecified: Secondary | ICD-10-CM | POA: Diagnosis present

## 2020-02-19 DIAGNOSIS — J9602 Acute respiratory failure with hypercapnia: Secondary | ICD-10-CM | POA: Diagnosis present

## 2020-02-19 DIAGNOSIS — Z833 Family history of diabetes mellitus: Secondary | ICD-10-CM

## 2020-02-19 DIAGNOSIS — A419 Sepsis, unspecified organism: Principal | ICD-10-CM | POA: Diagnosis present

## 2020-02-19 DIAGNOSIS — Z20822 Contact with and (suspected) exposure to covid-19: Secondary | ICD-10-CM | POA: Diagnosis present

## 2020-02-19 DIAGNOSIS — G9341 Metabolic encephalopathy: Secondary | ICD-10-CM | POA: Diagnosis present

## 2020-02-19 DIAGNOSIS — Z800 Family history of malignant neoplasm of digestive organs: Secondary | ICD-10-CM

## 2020-02-19 DIAGNOSIS — Z6841 Body Mass Index (BMI) 40.0 and over, adult: Secondary | ICD-10-CM

## 2020-02-19 DIAGNOSIS — F329 Major depressive disorder, single episode, unspecified: Secondary | ICD-10-CM | POA: Diagnosis present

## 2020-02-19 DIAGNOSIS — K529 Noninfective gastroenteritis and colitis, unspecified: Secondary | ICD-10-CM | POA: Diagnosis present

## 2020-02-19 DIAGNOSIS — M545 Low back pain: Secondary | ICD-10-CM | POA: Diagnosis present

## 2020-02-19 DIAGNOSIS — J690 Pneumonitis due to inhalation of food and vomit: Secondary | ICD-10-CM | POA: Diagnosis present

## 2020-02-19 DIAGNOSIS — Z803 Family history of malignant neoplasm of breast: Secondary | ICD-10-CM

## 2020-02-19 DIAGNOSIS — Z7901 Long term (current) use of anticoagulants: Secondary | ICD-10-CM

## 2020-02-19 DIAGNOSIS — Z79899 Other long term (current) drug therapy: Secondary | ICD-10-CM

## 2020-02-20 ENCOUNTER — Emergency Department (HOSPITAL_COMMUNITY): Payer: 59

## 2020-02-20 ENCOUNTER — Inpatient Hospital Stay (HOSPITAL_COMMUNITY): Payer: 59

## 2020-02-20 ENCOUNTER — Encounter (HOSPITAL_COMMUNITY): Payer: Self-pay

## 2020-02-20 DIAGNOSIS — J690 Pneumonitis due to inhalation of food and vomit: Secondary | ICD-10-CM | POA: Diagnosis present

## 2020-02-20 DIAGNOSIS — Z800 Family history of malignant neoplasm of digestive organs: Secondary | ICD-10-CM | POA: Diagnosis not present

## 2020-02-20 DIAGNOSIS — J9602 Acute respiratory failure with hypercapnia: Secondary | ICD-10-CM | POA: Diagnosis present

## 2020-02-20 DIAGNOSIS — K567 Ileus, unspecified: Secondary | ICD-10-CM | POA: Diagnosis present

## 2020-02-20 DIAGNOSIS — K219 Gastro-esophageal reflux disease without esophagitis: Secondary | ICD-10-CM | POA: Diagnosis present

## 2020-02-20 DIAGNOSIS — Z20822 Contact with and (suspected) exposure to covid-19: Secondary | ICD-10-CM | POA: Diagnosis present

## 2020-02-20 DIAGNOSIS — D509 Iron deficiency anemia, unspecified: Secondary | ICD-10-CM | POA: Diagnosis present

## 2020-02-20 DIAGNOSIS — Z803 Family history of malignant neoplasm of breast: Secondary | ICD-10-CM | POA: Diagnosis not present

## 2020-02-20 DIAGNOSIS — M545 Low back pain: Secondary | ICD-10-CM | POA: Diagnosis present

## 2020-02-20 DIAGNOSIS — Z833 Family history of diabetes mellitus: Secondary | ICD-10-CM | POA: Diagnosis not present

## 2020-02-20 DIAGNOSIS — G9341 Metabolic encephalopathy: Secondary | ICD-10-CM | POA: Diagnosis present

## 2020-02-20 DIAGNOSIS — Z5329 Procedure and treatment not carried out because of patient's decision for other reasons: Secondary | ICD-10-CM | POA: Diagnosis not present

## 2020-02-20 DIAGNOSIS — K529 Noninfective gastroenteritis and colitis, unspecified: Secondary | ICD-10-CM | POA: Diagnosis present

## 2020-02-20 DIAGNOSIS — Z7901 Long term (current) use of anticoagulants: Secondary | ICD-10-CM | POA: Diagnosis not present

## 2020-02-20 DIAGNOSIS — I5030 Unspecified diastolic (congestive) heart failure: Secondary | ICD-10-CM | POA: Diagnosis not present

## 2020-02-20 DIAGNOSIS — Z8371 Family history of colonic polyps: Secondary | ICD-10-CM | POA: Diagnosis not present

## 2020-02-20 DIAGNOSIS — Z6841 Body Mass Index (BMI) 40.0 and over, adult: Secondary | ICD-10-CM | POA: Diagnosis not present

## 2020-02-20 DIAGNOSIS — Z9049 Acquired absence of other specified parts of digestive tract: Secondary | ICD-10-CM | POA: Diagnosis not present

## 2020-02-20 DIAGNOSIS — Z79899 Other long term (current) drug therapy: Secondary | ICD-10-CM | POA: Diagnosis not present

## 2020-02-20 DIAGNOSIS — I480 Paroxysmal atrial fibrillation: Secondary | ICD-10-CM | POA: Diagnosis present

## 2020-02-20 DIAGNOSIS — F329 Major depressive disorder, single episode, unspecified: Secondary | ICD-10-CM | POA: Diagnosis present

## 2020-02-20 DIAGNOSIS — A419 Sepsis, unspecified organism: Secondary | ICD-10-CM | POA: Diagnosis present

## 2020-02-20 DIAGNOSIS — E872 Acidosis, unspecified: Secondary | ICD-10-CM | POA: Diagnosis present

## 2020-02-20 DIAGNOSIS — Z8249 Family history of ischemic heart disease and other diseases of the circulatory system: Secondary | ICD-10-CM | POA: Diagnosis not present

## 2020-02-20 DIAGNOSIS — J9601 Acute respiratory failure with hypoxia: Secondary | ICD-10-CM | POA: Diagnosis present

## 2020-02-20 LAB — CBC WITH DIFFERENTIAL/PLATELET
Abs Immature Granulocytes: 0.05 10*3/uL (ref 0.00–0.07)
Basophils Absolute: 0 10*3/uL (ref 0.0–0.1)
Basophils Relative: 0 %
Eosinophils Absolute: 0.1 10*3/uL (ref 0.0–0.5)
Eosinophils Relative: 1 %
HCT: 34.7 % — ABNORMAL LOW (ref 36.0–46.0)
Hemoglobin: 10 g/dL — ABNORMAL LOW (ref 12.0–15.0)
Immature Granulocytes: 0 %
Lymphocytes Relative: 7 %
Lymphs Abs: 1 10*3/uL (ref 0.7–4.0)
MCH: 22.2 pg — ABNORMAL LOW (ref 26.0–34.0)
MCHC: 28.8 g/dL — ABNORMAL LOW (ref 30.0–36.0)
MCV: 77.1 fL — ABNORMAL LOW (ref 80.0–100.0)
Monocytes Absolute: 0.6 10*3/uL (ref 0.1–1.0)
Monocytes Relative: 4 %
Neutro Abs: 12.8 10*3/uL — ABNORMAL HIGH (ref 1.7–7.7)
Neutrophils Relative %: 88 %
Platelets: 370 10*3/uL (ref 150–400)
RBC: 4.5 MIL/uL (ref 3.87–5.11)
RDW: 17.1 % — ABNORMAL HIGH (ref 11.5–15.5)
WBC: 14.6 10*3/uL — ABNORMAL HIGH (ref 4.0–10.5)
nRBC: 0 % (ref 0.0–0.2)

## 2020-02-20 LAB — COMPREHENSIVE METABOLIC PANEL
ALT: 15 U/L (ref 0–44)
AST: 17 U/L (ref 15–41)
Albumin: 3.4 g/dL — ABNORMAL LOW (ref 3.5–5.0)
Alkaline Phosphatase: 104 U/L (ref 38–126)
Anion gap: 8 (ref 5–15)
BUN: 14 mg/dL (ref 6–20)
CO2: 23 mmol/L (ref 22–32)
Calcium: 7.5 mg/dL — ABNORMAL LOW (ref 8.9–10.3)
Chloride: 104 mmol/L (ref 98–111)
Creatinine, Ser: 0.92 mg/dL (ref 0.44–1.00)
GFR calc Af Amer: >60 mL/min (ref >60)
GFR calc non Af Amer: >60 mL/min (ref >60)
Glucose, Bld: 104 mg/dL — ABNORMAL HIGH (ref 70–99)
Potassium: 3.9 mmol/L (ref 3.5–5.1)
Sodium: 135 mmol/L (ref 135–145)
Total Bilirubin: 0.4 mg/dL (ref 0.3–1.2)
Total Protein: 6.7 g/dL (ref 6.5–8.1)

## 2020-02-20 LAB — URINALYSIS, ROUTINE W REFLEX MICROSCOPIC
Bilirubin Urine: NEGATIVE
Glucose, UA: NEGATIVE mg/dL
Ketones, ur: NEGATIVE mg/dL
Leukocytes,Ua: NEGATIVE
Nitrite: POSITIVE — AB
Protein, ur: NEGATIVE mg/dL
Specific Gravity, Urine: 1.009 (ref 1.005–1.030)
pH: 5 (ref 5.0–8.0)

## 2020-02-20 LAB — IRON AND TIBC
Iron: 17 ug/dL — ABNORMAL LOW (ref 28–170)
Saturation Ratios: 3 % — ABNORMAL LOW (ref 10.4–31.8)
TIBC: 487 ug/dL — ABNORMAL HIGH (ref 250–450)
UIBC: 470 ug/dL

## 2020-02-20 LAB — BLOOD GAS, ARTERIAL
Acid-base deficit: 3.6 mmol/L — ABNORMAL HIGH (ref 0.0–2.0)
Bicarbonate: 21.9 mmol/L (ref 20.0–28.0)
O2 Saturation: 94.1 %
Patient temperature: 103.1
pCO2 arterial: 50.3 mmHg — ABNORMAL HIGH (ref 32.0–48.0)
pH, Arterial: 7.277 — ABNORMAL LOW (ref 7.350–7.450)
pO2, Arterial: 95.3 mmHg (ref 83.0–108.0)

## 2020-02-20 LAB — BLOOD GAS, VENOUS
Acid-base deficit: 0.6 mmol/L (ref 0.0–2.0)
Bicarbonate: 24.2 mmol/L (ref 20.0–28.0)
O2 Saturation: 89.2 %
Patient temperature: 98.6
pCO2, Ven: 43.4 mmHg — ABNORMAL LOW (ref 44.0–60.0)
pH, Ven: 7.365 (ref 7.250–7.430)
pO2, Ven: 69.5 mmHg — ABNORMAL HIGH (ref 32.0–45.0)

## 2020-02-20 LAB — ECHOCARDIOGRAM COMPLETE
Height: 66 in
Weight: 4945.36 oz

## 2020-02-20 LAB — RAPID URINE DRUG SCREEN, HOSP PERFORMED
Amphetamines: NOT DETECTED
Barbiturates: NOT DETECTED
Benzodiazepines: NOT DETECTED
Cocaine: NOT DETECTED
Opiates: POSITIVE — AB
Tetrahydrocannabinol: NOT DETECTED

## 2020-02-20 LAB — BRAIN NATRIURETIC PEPTIDE: B Natriuretic Peptide: 50.6 pg/mL (ref 0.0–100.0)

## 2020-02-20 LAB — LACTIC ACID, PLASMA
Lactic Acid, Venous: 2.4 mmol/L (ref 0.5–1.9)
Lactic Acid, Venous: 1.7 mmol/L (ref 0.5–1.9)

## 2020-02-20 LAB — TSH: TSH: 2.012 u[IU]/mL (ref 0.350–4.500)

## 2020-02-20 LAB — APTT: aPTT: 32 seconds (ref 24–36)

## 2020-02-20 LAB — PROTIME-INR
INR: 1.2 (ref 0.8–1.2)
Prothrombin Time: 14.2 seconds (ref 11.4–15.2)

## 2020-02-20 LAB — I-STAT BETA HCG BLOOD, ED (MC, WL, AP ONLY): I-stat hCG, quantitative: <5 m[IU]/mL (ref <5)

## 2020-02-20 LAB — FERRITIN: Ferritin: 10 ng/mL — ABNORMAL LOW (ref 11–307)

## 2020-02-20 LAB — SARS CORONAVIRUS 2 BY RT PCR (HOSPITAL ORDER, PERFORMED IN ~~LOC~~ HOSPITAL LAB): SARS Coronavirus 2: NEGATIVE

## 2020-02-20 LAB — AMMONIA: Ammonia: 9 umol/L (ref 9–35)

## 2020-02-20 LAB — MRSA PCR SCREENING: MRSA by PCR: NEGATIVE

## 2020-02-20 MED ORDER — VANCOMYCIN HCL IN DEXTROSE 1-5 GM/200ML-% IV SOLN
1000.0000 mg | Freq: Once | INTRAVENOUS | Status: AC
  Administered 2020-02-20: 1000 mg via INTRAVENOUS
  Filled 2020-02-20: qty 200

## 2020-02-20 MED ORDER — ACETAMINOPHEN 325 MG PO TABS
650.0000 mg | ORAL_TABLET | Freq: Four times a day (QID) | ORAL | Status: DC | PRN
  Administered 2020-02-20: 650 mg via ORAL
  Filled 2020-02-20 (×2): qty 2

## 2020-02-20 MED ORDER — SODIUM CHLORIDE 0.9 % IV SOLN
2.0000 g | Freq: Once | INTRAVENOUS | Status: AC
  Administered 2020-02-20: 2 g via INTRAVENOUS
  Filled 2020-02-20: qty 2

## 2020-02-20 MED ORDER — METRONIDAZOLE IN NACL 5-0.79 MG/ML-% IV SOLN
500.0000 mg | Freq: Three times a day (TID) | INTRAVENOUS | Status: DC
  Administered 2020-02-20: 500 mg via INTRAVENOUS
  Filled 2020-02-20: qty 100

## 2020-02-20 MED ORDER — APIXABAN 5 MG PO TABS
5.0000 mg | ORAL_TABLET | Freq: Two times a day (BID) | ORAL | Status: DC
  Administered 2020-02-20: 5 mg via ORAL
  Filled 2020-02-20: qty 1

## 2020-02-20 MED ORDER — BUPROPION HCL ER (XL) 300 MG PO TB24
300.0000 mg | ORAL_TABLET | Freq: Every day | ORAL | Status: DC
  Administered 2020-02-20: 300 mg via ORAL
  Filled 2020-02-20: qty 1

## 2020-02-20 MED ORDER — METRONIDAZOLE IN NACL 5-0.79 MG/ML-% IV SOLN
500.0000 mg | Freq: Once | INTRAVENOUS | Status: AC
  Administered 2020-02-20: 500 mg via INTRAVENOUS
  Filled 2020-02-20: qty 100

## 2020-02-20 MED ORDER — SODIUM CHLORIDE 0.9 % IV SOLN
3.0000 g | Freq: Four times a day (QID) | INTRAVENOUS | Status: DC
  Administered 2020-02-20 (×2): 3 g via INTRAVENOUS
  Filled 2020-02-20: qty 3
  Filled 2020-02-20: qty 8
  Filled 2020-02-20: qty 3

## 2020-02-20 MED ORDER — FLUOXETINE HCL 20 MG PO CAPS: 20.0000 mg | ORAL_CAPSULE | Freq: Every day | ORAL | Status: DC

## 2020-02-20 MED ORDER — METOPROLOL SUCCINATE ER 25 MG PO TB24: 25.0000 mg | ORAL_TABLET | Freq: Every day | ORAL | Status: DC

## 2020-02-20 MED ORDER — SODIUM CHLORIDE 0.9 % IV BOLUS
1000.0000 mL | Freq: Once | INTRAVENOUS | Status: AC
  Administered 2020-02-20: 1000 mL via INTRAVENOUS

## 2020-02-20 MED ORDER — IPRATROPIUM BROMIDE 0.02 % IN SOLN: 0.5000 mg | Freq: Four times a day (QID) | RESPIRATORY_TRACT | Status: DC | PRN

## 2020-02-20 MED ORDER — SODIUM CHLORIDE 0.9 % IV SOLN
1.0000 g | INTRAVENOUS | Status: DC
  Filled 2020-02-20: qty 10

## 2020-02-20 MED ORDER — ACETAMINOPHEN 650 MG RE SUPP: 650.0000 mg | Freq: Four times a day (QID) | RECTAL | Status: DC | PRN

## 2020-02-20 MED ORDER — FUROSEMIDE 20 MG PO TABS
20.0000 mg | ORAL_TABLET | Freq: Two times a day (BID) | ORAL | Status: DC
  Administered 2020-02-20: 20 mg via ORAL
  Filled 2020-02-20: qty 1

## 2020-02-20 MED ORDER — CHLORHEXIDINE GLUCONATE CLOTH 2 % EX PADS
6.0000 | MEDICATED_PAD | Freq: Every day | CUTANEOUS | Status: DC
  Administered 2020-02-20: 6 via TOPICAL

## 2020-02-20 MED ORDER — ACETAMINOPHEN 325 MG PO TABS
650.0000 mg | ORAL_TABLET | Freq: Once | ORAL | Status: AC
  Administered 2020-02-20: 650 mg via ORAL
  Filled 2020-02-20: qty 2

## 2020-02-20 MED ORDER — POLYETHYLENE GLYCOL 3350 17 G PO PACK: 17.0000 g | PACK | Freq: Every day | ORAL | Status: DC | PRN

## 2020-02-20 NOTE — Evaluation (Signed)
Clinical/Bedside Swallow Evaluation Patient Details  Name: Tammy [name] MRN: [id number] Date of Birth: Apr 06, 1961  Today's Date: 02/20/2020 Time: SLP Start Time (ACUTE ONLY): 20 SLP Stop Time (ACUTE ONLY): 1105 SLP Time Calculation (min) (ACUTE ONLY): 25 min  Past Medical History:  Past Medical History:  Diagnosis Date  . Anxiety   . Arthritis    neck and lower back - deg disc disease, fingers  . Depression   . Frequency of urination   . GERD (gastroesophageal reflux disease)   . History of seizure per pt no seizure since    11-07-2015  in setting of stress and sleep deprivation  /  negative work-up by neurologist unknown idiology  . Hypothyroidism   . Injury of thumb, left    03-31-2016    . Pelvic relaxation   . SUI (stress urinary incontinence, female)    Past Surgical History:  Past Surgical History:  Procedure Laterality Date  . ANTERIOR AND POSTERIOR REPAIR  09/25/2012   Procedure: ANTERIOR (CYSTOCELE) AND POSTERIOR REPAIR (RECTOCELE);  Surgeon: Lahoma Crocker, MD;  Location: [facility];  Service: Gynecology;  Laterality: N/A;  . ANTERIOR AND POSTERIOR REPAIR WITH SACROSPINOUS FIXATION N/A 04/12/2016   Procedure: ANTERIOR AND POSTERIOR REPAIR WITH SACROSPINOUS LIGAMENT SUSPENSION, CYSTOSCOPY;  Surgeon: Arvella Nigh, MD;  Location: [facility];  Service: Gynecology;  Laterality: N/A;  . APPENDECTOMY  1987  . BREAST EXCISIONAL BIOPSY Left    No scar visable   . BREAST REDUCTION SURGERY  2000  aprrox  . ENDOMETRIAL ABLATION W/ NOVASURE  2011   in office  . EXCISION LEFT BREAST BX  12-07-1999   benign  . LAPAROSCOPIC CHOLECYSTECTOMY  02-09-2005  . LAPAROSCOPIC VAGINAL HYSTERECTOMY WITH SALPINGO OOPHORECTOMY Bilateral 04/12/2016   Procedure: LAPAROSCOPIC ASSISTED VAGINAL HYSTERECTOMY WITH SALPINGO OOPHORECTOMY;  Surgeon: Arvella Nigh, MD;  Location: [facility];  Service: Gynecology;  Laterality: Bilateral;  . REDUCTION MAMMAPLASTY Bilateral    . ROUX-EN-Y GASTRIC BYPASS  06/  2002  . TUBAL LIGATION  10/ 1990   HPI:  59yo female admitted 02/19/20 with AMS, back pain. PMH: GERD, seizures, depression, anxiety, arthritis. CXR = Cardiomegaly with vascular congestion and hazy bilateral opacities, suspicious for edema. No pleural effusion or pneumothorax. Aortic atherosclerosis   Assessment / Plan / Recommendation Clinical Impression  Pt presents with adequate dentition, functional oral motor strength. Pt is missing some lower teeth, but reports tolerance of regular diet and thin liquids without difficulty swallowing prior to admit. Pt accepted trials of thin liquid, puree, and solid textures. No obvious oral issues or overt s/s aspiration observed. Pt reports recurrent pneumonia, however, occuring 2-3 x per year. If aspiration is occuring, it is silent, and silent aspiration cannot be determined at bedside. Recommend MBS to objectively assess swallow function and safety, and to rule out silent aspiration. Will schedule with radiology. RN and MD informed. Pt in agreement.   SLP Visit Diagnosis: Dysphagia, unspecified (R13.10)    Aspiration Risk  Mild aspiration risk    Diet Recommendation Regular;Thin liquid   Liquid Administration via: Straw;Cup Medication Administration: Whole meds with liquid Supervision: Patient able to self feed Compensations: Slow rate;Small sips/bites Postural Changes: Seated upright at 90 degrees;Remain upright for at least 30 minutes after po intake    Other  Recommendations Oral Care Recommendations: Oral care BID   Follow up Recommendations Other (comment)(TBD)      Frequency and Duration min 1 x/week  1 week;2 weeks       Prognosis  Prognosis for Safe Diet Advancement: Good      Swallow Study   General Date of Onset: 02/19/20 HPI: 59yo female admitted 02/19/20 with AMS, back pain. PMH: GERD, seizures, depression, anxiety, arthritis. CXR = Cardiomegaly with vascular congestion and hazy bilateral  opacities, suspicious for edema. No pleural effusion or pneumothorax. Aortic atherosclerosis Type of Study: Bedside Swallow Evaluation Previous Swallow Assessment: none Diet Prior to this Study: Regular;Thin liquids Temperature Spikes Noted: Yes(101 on admit) Respiratory Status: Room air History of Recent Intubation: No Behavior/Cognition: Cooperative;Pleasant mood Oral Cavity Assessment: Within Functional Limits Oral Care Completed by SLP: No Oral Cavity - Dentition: Adequate natural dentition;Missing dentition Vision: Functional for self-feeding Self-Feeding Abilities: Able to feed self Patient Positioning: Upright in bed Baseline Vocal Quality: Normal Volitional Cough: Weak Volitional Swallow: Able to elicit    Oral/Motor/Sensory Function Overall Oral Motor/Sensory Function: Within functional limits   Ice Chips Ice chips: Not tested   Thin Liquid Thin Liquid: Within functional limits Presentation: Straw    Nectar Thick Nectar Thick Liquid: Not tested   Honey Thick Honey Thick Liquid: Not tested   Puree Puree: Within functional limits Presentation: Self Fed;Spoon   Solid     Solid: Within functional limits Presentation: [place] B. Quentin Ore, [facility], [place] Speech Language Pathologist Office: [phone] Pager: [phone]  Shonna Chock 02/20/2020,11:12 AM

## 2020-02-20 NOTE — ED Triage Notes (Signed)
Patient in by Ems from home due to being altered and increased weakness per husband, patient complaining of back pain, last time she had these s/s she had an uti per husband

## 2020-02-20 NOTE — Progress Notes (Signed)
Pt. Left AMA at approx 1720. RN took other Left AC IV out and pt. Signed AMA form.

## 2020-02-20 NOTE — Progress Notes (Signed)
A consult was received from an ED physician for Vancomycin, cefepime per pharmacy dosing.  The patient's profile has been reviewed for ht/wt/allergies/indication/available labs.   A one time order has been placed for Vancomycin 1gm iv x1, and Cefepime 2gm iv x1.  Further antibiotics/pharmacy consults should be ordered by admitting physician if indicated.                       Thank you, Nani Skillern Crowford 02/20/2020  12:13 AM

## 2020-02-20 NOTE — TOC Progression Note (Signed)
Transition of Care [facility]) - Progression Note    Patient Details  Name: MOESHIA WILFONG MRN: [id number] Date of Birth: 1960-11-15  Transition of [facility]) CM/SW Contact  Leeroy Cha, RN Phone Number: 02/20/2020, 10:47 AM  Clinical Narrative:    Temp-103, cxr-hazy pulmonary edema,iv unasyn,uds+opiates, wbc 14.6,From home has pcp   Expected Discharge Plan: Home/Self Care Barriers to Discharge: Continued Medical Work up  Expected Discharge Plan and Services Expected Discharge Plan: Home/Self Care   Discharge Planning Services: CM Consult   Living arrangements for the past 2 months: Single Family Home                                       Social Determinants of Health (SDOH) Interventions    Readmission Risk Interventions No flowsheet data found.

## 2020-02-20 NOTE — Plan of Care (Signed)
  Problem: Education: Goal: Knowledge of General Education information will improve Description Including pain rating scale, medication(s)/side effects and non-pharmacologic comfort measures Outcome: Progressing   

## 2020-02-20 NOTE — ED Provider Notes (Addendum)
[facility] DEPT Provider Note   CSN: [id number] Arrival date & time: 02/19/20  2349     History Chief Complaint  Patient presents with  . Altered Mental Status  . Back Pain    Tammy [name] is a 59 y.o. female.  Level 5 caveat for altered mental status.  Patient brought in by EMS with confusion, increasing shortness of breath and low back pain onset today.  She apparently has had increased fatigue while mowing the neighbors yard several days ago.  Husband was concerned about a possible UTI or pneumonia.  Patient is confused but oriented to person and place.  She complains of low back pain and some shortness of breath.  EMS reports she was febrile on arrival, tachypneic and tachycardic.  Patient denies any chest pain.  She does feel some shortness of breath.  She has low back pain but no abdominal pain.  No nausea or vomiting.  Denies pain with urination or blood in the urine.  Denies cough.  Past medical history includes GERD, history of seizures, depression and anxiety. She is on Eliquis for history of atrial fibrillation  The history is provided by the patient and the EMS personnel. The history is limited by the condition of the patient.       Past Medical History:  Diagnosis Date  . Anxiety   . Arthritis    neck and lower back - deg disc disease, fingers  . Depression   . Frequency of urination   . GERD (gastroesophageal reflux disease)   . History of seizure per pt no seizure since    11-07-2015  in setting of stress and sleep deprivation  /  negative work-up by neurologist unknown idiology  . Hypothyroidism   . Injury of thumb, left    03-31-2016    . Pelvic relaxation   . SUI (stress urinary incontinence, female)     Patient Active Problem List   Diagnosis Date Noted  . Chronic pain of both knees 01/08/2020  . Encephalopathy acute 10/27/2019  . Acute encephalopathy 10/27/2019  . CAP (community acquired pneumonia) 07/31/2019  .  Atrial fibrillation with RVR ([place]) 07/31/2019  . Morbid obesity with BMI of 50.0-59.9, adult ([place]) 07/31/2019  . Community acquired pneumonia 04/14/2019  . Closed nondisplaced fracture of head of left radius 06/20/2018  . Sprain of left wrist 06/20/2018  . Peroneal tendinitis, right leg 06/23/2017  . Trochanteric bursitis, right hip 06/23/2017  . Acute right-sided low back pain with left-sided sciatica 03/24/2017  . Pain in right hip 03/24/2017  . Impingement syndrome of left shoulder 08/30/2016  . Impingement syndrome of right shoulder 08/30/2016  . Plantar fasciitis, bilateral 08/30/2016  . Pelvic relaxation 04/13/2016    Class: Present on Admission  . S/P hysterectomy 04/12/2016  . Depression 11/16/2015  . Generalized anxiety disorder 11/16/2015  . Convulsion ([place]) 11/16/2015  . SUI (stress urinary incontinence, female) 09/26/2012    Past Surgical History:  Procedure Laterality Date  . ANTERIOR AND POSTERIOR REPAIR  09/25/2012   Procedure: ANTERIOR (CYSTOCELE) AND POSTERIOR REPAIR (RECTOCELE);  Surgeon: Lahoma Crocker, MD;  Location: [facility];  Service: Gynecology;  Laterality: N/A;  . ANTERIOR AND POSTERIOR REPAIR WITH SACROSPINOUS FIXATION N/A 04/12/2016   Procedure: ANTERIOR AND POSTERIOR REPAIR WITH SACROSPINOUS LIGAMENT SUSPENSION, CYSTOSCOPY;  Surgeon: Arvella Nigh, MD;  Location: [facility];  Service: Gynecology;  Laterality: N/A;  . APPENDECTOMY  1987  . BREAST EXCISIONAL BIOPSY Left    No scar visable   .  BREAST REDUCTION SURGERY  2000  aprrox  . ENDOMETRIAL ABLATION W/ NOVASURE  2011   in office  . EXCISION LEFT BREAST BX  12-07-1999   benign  . LAPAROSCOPIC CHOLECYSTECTOMY  02-09-2005  . LAPAROSCOPIC VAGINAL HYSTERECTOMY WITH SALPINGO OOPHORECTOMY Bilateral 04/12/2016   Procedure: LAPAROSCOPIC ASSISTED VAGINAL HYSTERECTOMY WITH SALPINGO OOPHORECTOMY;  Surgeon: Arvella Nigh, MD;  Location: [facility];  Service: Gynecology;  Laterality:  Bilateral;  . REDUCTION MAMMAPLASTY Bilateral   . ROUX-EN-Y GASTRIC BYPASS  06/  2002  . TUBAL LIGATION  10/ 1990     OB History   No obstetric history on file.     Family History  Problem Relation Age of Onset  . Colon polyps Mother   . Heart disease Father 40       CABG  . Breast cancer Maternal Aunt   . Stomach cancer Maternal Grandmother   . Diabetes Maternal Grandmother   . Heart disease Maternal Grandmother   . Stomach cancer Maternal Uncle   . Colon cancer Maternal Uncle        x3  . Diabetes Maternal Aunt        Type I  . Diabetes Maternal Uncle        x5 - Type II  . Heart disease Paternal Grandmother   . Esophageal cancer Neg Hx   . Gallbladder disease Neg Hx   . Seizures Neg Hx     Social History   Tobacco Use  . Smoking status: Never Smoker  . Smokeless tobacco: Never Used  Substance Use Topics  . Alcohol use: No    Comment: drinks daily - none since 08/2012 per pt.  . Drug use: No    Home Medications Prior to Admission medications   Medication Sig Start Date End Date Taking? Authorizing Provider  apixaban (ELIQUIS) 5 MG TABS tablet Take 1 tablet (5 mg total) by mouth 2 (two) times daily. 09/03/19 01/22/20  Lendon Colonel, NP  buPROPion (WELLBUTRIN XL) 150 MG 24 hr tablet Take 450 mg by mouth daily. 12/17/19   [provider]  buPROPion (WELLBUTRIN XL) 300 MG 24 hr tablet Take 300 mg by mouth daily.  07/26/18   [provider]  clonazePAM (KLONOPIN) 0.5 MG tablet Take 0.5 tablets (0.25 mg total) by mouth 3 (three) times daily as needed for anxiety. 10/28/19   Bonnell Public, MD  Diclofenac Sodium (PENNSAID) 2 % SOLN Apply 2 g topically 2 (two) times daily as needed (to affected area). 01/08/20   Leandrew Koyanagi, MD  diclofenac Sodium (VOLTAREN) 1 % GEL Apply 2 g topically daily as needed (pain).  11/10/15   [provider]  diltiazem (CARDIZEM CD) 300 MG 24 hr capsule Take 1 capsule (300 mg total) by mouth daily. Patient  not taking: Reported on 01/22/2020 09/04/19 01/22/20  Minus Breeding, MD  FLUoxetine (PROZAC) 20 MG capsule Take 20 mg by mouth at bedtime.  07/26/18   [provider]  furosemide (LASIX) 20 MG tablet TAKE ONE TABLET BY MOUTH TWICE A DAY 02/11/20   Sherran Needs, NP  gabapentin (NEURONTIN) 300 MG capsule Take 1 capsule (300 mg total) by mouth 2 (two) times daily. 10/28/19   Dana Allan I, MD  metoprolol succinate (TOPROL-XL) 25 MG 24 hr tablet TAKE ONE TABLET BY MOUTH AT BEDTIME 02/05/20   Sherran Needs, NP  OLANZapine (ZYPREXA) 10 MG tablet Take 10 mg by mouth at bedtime. 04/04/19   [provider]  potassium  chloride SA (KLOR-CON M20) 20 MEQ tablet Take 1 tablet (20 mEq total) by mouth daily. 11/16/19   Sherran Needs, NP    Allergies    Nsaids, Adhesive [tape], Latex, and Sulfa antibiotics  Review of Systems   Review of Systems  Unable to perform ROS: Mental status change    Physical Exam Updated Vital Signs BP (!) 122/95 (BP Location: Left Wrist)   Pulse 78   Temp 98.2 F (36.8 C) (Oral)   Resp 17   Ht 5\' 6"  (1.676 m)   Wt (!) 140.2 kg   LMP 09/30/2012   SpO2 96%   BMI 49.89 kg/m   Physical Exam Vitals and nursing note reviewed.  Constitutional:      General: She is not in acute distress.    Appearance: She is well-developed. She is obese. She is ill-appearing.     Comments: Obtunded, slow to respond, follows commands, oriented to person place  HENT:     Head: Normocephalic and atraumatic.     Mouth/Throat:     Mouth: Mucous membranes are dry.     Pharynx: No oropharyngeal exudate.  Eyes:     Conjunctiva/sclera: Conjunctivae normal.     Pupils: Pupils are equal, round, and reactive to light.  Neck:     Comments: No meningismus. Cardiovascular:     Rate and Rhythm: Regular rhythm. Tachycardia present.     Heart sounds: Normal heart sounds. No murmur.  Pulmonary:     Effort: Pulmonary effort is normal. No respiratory distress.     Breath  sounds: Normal breath sounds.  Abdominal:     Palpations: Abdomen is soft.     Tenderness: There is no abdominal tenderness. There is no guarding or rebound.  Musculoskeletal:        General: Tenderness present. Normal range of motion.     Cervical back: Normal range of motion and neck supple.     Comments: Paraspinal lumbar tenderness bilaterally, no midline tenderness  Skin:    General: Skin is warm.  Neurological:     Mental Status: She is alert.     Cranial Nerves: No cranial nerve deficit.     Motor: No abnormal muscle tone.     Coordination: Coordination normal.     Comments: Obtunded, slow to respond, oriented to person and place, moves all extremities equally  Psychiatric:        Behavior: Behavior normal.     ED Results / Procedures / Treatments   Labs (all labs ordered are listed, but only abnormal results are displayed) Labs Reviewed  LACTIC ACID, PLASMA - Abnormal; Notable for the following components:      Result Value   Lactic Acid, Venous 2.4 (*)    All other components within normal limits  COMPREHENSIVE METABOLIC PANEL - Abnormal; Notable for the following components:   Glucose, Bld 104 (*)    Calcium 7.5 (*)    Albumin 3.4 (*)    All other components within normal limits  CBC WITH DIFFERENTIAL/PLATELET - Abnormal; Notable for the following components:   WBC 14.6 (*)    Hemoglobin 10.0 (*)    HCT 34.7 (*)    MCV 77.1 (*)    MCH 22.2 (*)    MCHC 28.8 (*)    RDW 17.1 (*)    Neutro Abs 12.8 (*)    All other components within normal limits  URINALYSIS, ROUTINE W REFLEX MICROSCOPIC - Abnormal; Notable for the following components:   Hgb urine dipstick MODERATE (*)  Nitrite POSITIVE (*)    Bacteria, UA MANY (*)    All other components within normal limits  BLOOD GAS, ARTERIAL - Abnormal; Notable for the following components:   pH, Arterial 7.277 (*)    pCO2 arterial 50.3 (*)    Acid-base deficit 3.6 (*)    All other components within normal limits   RAPID URINE DRUG SCREEN, HOSP PERFORMED - Abnormal; Notable for the following components:   Opiates POSITIVE (*)    All other components within normal limits  BLOOD GAS, VENOUS - Abnormal; Notable for the following components:   pCO2, Ven 43.4 (*)    pO2, Ven 69.5 (*)    All other components within normal limits  IRON AND TIBC - Abnormal; Notable for the following components:   Iron 17 (*)    TIBC 487 (*)    Saturation Ratios 3 (*)    All other components within normal limits  FERRITIN - Abnormal; Notable for the following components:   Ferritin 10 (*)    All other components within normal limits  SARS CORONAVIRUS 2 BY RT PCR (HOSPITAL ORDER, [facility])  MRSA PCR SCREENING  CULTURE, BLOOD (ROUTINE X 2)  CULTURE, BLOOD (ROUTINE X 2)  URINE CULTURE  LACTIC ACID, PLASMA  APTT  PROTIME-INR  AMMONIA  BRAIN NATRIURETIC PEPTIDE  TSH  I-STAT BETA HCG BLOOD, ED ([facility], [facility], [facility] ONLY)    EKG EKG Interpretation  Date/Time:  Wednesday Feb 20 2020 00:42:07 EDT Ventricular Rate:  94 PR Interval:    QRS Duration: 99 QT Interval:  339 QTC Calculation: 424 R Axis:   36 Text Interpretation: Sinus rhythm Abnormal inferior Q waves Minimal ST depression, inferior leads No significant change was found Confirmed by Ezequiel Essex [phone]) on 02/20/2020 1:04:03 AM   Radiology CT Head Wo Contrast  Result Date: 02/20/2020 CLINICAL DATA:  Altered mental status EXAM: CT HEAD WITHOUT CONTRAST TECHNIQUE: Contiguous axial images were obtained from the base of the skull through the vertex without intravenous contrast. COMPARISON:  CT brain 10/27/2019 FINDINGS: Brain: No acute territorial infarction, hemorrhage, or intracranial mass. Stable ventricle size with slight asymmetric prominence of the right lateral ventricle. Vascular: No hyperdense vessels.  No unexpected calcification Skull: Normal. Negative for fracture or focal lesion. Sinuses/Orbits: No acute finding. Other: None  IMPRESSION: Negative.  No CT evidence for acute intracranial abnormality Electronically Signed   By: Donavan Foil M.D.   On: 02/20/2020 01:24   DG Chest Port 1 View  Result Date: 02/20/2020 CLINICAL DATA:  Sepsis EXAM: PORTABLE CHEST 1 VIEW COMPARISON:  11/07/2019 FINDINGS: Cardiomegaly with vascular congestion and hazy bilateral opacities suspicious for edema. No pleural effusion or pneumothorax. Aortic atherosclerosis IMPRESSION: Cardiomegaly with vascular congestion and hazy pulmonary edema Electronically Signed   By: Donavan Foil M.D.   On: 02/20/2020 01:38   CT Renal Stone Study  Result Date: 02/20/2020 CLINICAL DATA:  Flank pain back pain EXAM: CT ABDOMEN AND PELVIS WITHOUT CONTRAST TECHNIQUE: Multidetector CT imaging of the abdomen and pelvis was performed following the standard protocol without IV contrast. COMPARISON:  CT 04/14/2019 FINDINGS: Lower chest: Lung bases demonstrate patchy consolidations and ground-glass densities within the bilateral lower lobes with slight nodularity. No pleural effusion. Borderline cardiomegaly. Hepatobiliary: No focal liver abnormality is seen. Status post cholecystectomy. No biliary dilatation. Pancreas: Unremarkable. No pancreatic ductal dilatation or surrounding inflammatory changes. Spleen: Normal in size without focal abnormality. Adrenals/Urinary Tract: Adrenal glands are unremarkable. Kidneys are normal, without renal calculi, focal  lesion, or hydronephrosis. Bladder is unremarkable. Stomach/Bowel: Status post gastric bypass. Fluid-filled slightly dilated jejunal loops with gradual transition to non dilated mid and distal small bowel. Mild gaseous dilatation of the colon as well. No bowel wall thickening. Status post appendectomy. Vascular/Lymphatic: No significant vascular findings are present. No enlarged abdominal or pelvic lymph nodes. Reproductive: Status post hysterectomy. No adnexal masses. Other: Negative for free air or free fluid. Musculoskeletal:  Degenerative changes at L4-L5 and L5-S1. No acute or suspicious osseous abnormality. IMPRESSION: 1. Patchy bilateral lung base consolidations which may reflect bilateral pneumonia or possible aspiration. 2. Scattered fluid-filled slightly dilated jejunal loops with air distension of colon, suggestive of ileus or enteritis. No convincing evidence for obstruction Electronically Signed   By: Donavan Foil M.D.   On: 02/20/2020 01:31    Procedures .Critical Care Performed by: Ezequiel Essex, MD Authorized by: Ezequiel Essex, MD   Critical care provider statement:    Critical care time (minutes):  45   Critical care was necessary to treat or prevent imminent or life-threatening deterioration of the following conditions:  Sepsis and respiratory failure   Critical care was time spent personally by me on the following activities:  Discussions with consultants, evaluation of patient's response to treatment, examination of patient, ordering and performing treatments and interventions, ordering and review of laboratory studies, ordering and review of radiographic studies, pulse oximetry, re-evaluation of patient's condition, obtaining history from patient or surrogate and review of old charts   (including critical care time)  Medications Ordered in ED Medications - No data to display  ED Course  I have reviewed the triage vital signs and the nursing notes.  Pertinent labs & imaging results that were available during my care of the patient were reviewed by me and considered in my medical decision making (see chart for details).    MDM Rules/Calculators/A&P                     Patient from home with altered mental status, fever, shortness of breath and low back pain with concern for UTI.  Code sepsis activated.  Patient will be given broad-spectrum antibiotics and IVF after cultures are obtained.  Obtain chest x-ray and labs.  Lactate 2.4.  Leukocytosis noted.  CT head is negative.  Chest  x-ray with edema a patchy airspace disease at the bases.  Patient does have new oxygen requirement and fever. Last echo with normal E. Judicious IVF given. Mixed acidosis on ABG with mild hypercarbia. Does where CPAP at night.  On multiple sedating meds.   Suspect aspiration pneumonia. UA also with bacteria and nitrite positive.  Protecting airway. No obstructive uropathy on CT.  Will need admission for sepsis with respiratory failure and encephalopathy.  D/w Dr. Vallery Ridge.  Tammy [name] was evaluated in Emergency Department on 02/20/2020 for the symptoms described in the history of present illness. She was evaluated in the context of the global COVID-19 pandemic, which necessitated consideration that the patient might be at risk for infection with the SARS-CoV-2 virus that causes COVID-19. Institutional protocols and algorithms that pertain to the evaluation of patients at risk for COVID-19 are in a state of rapid change based on information released by regulatory bodies including the CDC and federal and state organizations. These policies and algorithms were followed during the patient's care in the ED.    Final Clinical Impression(s) / ED Diagnoses Final diagnoses:  Sepsis with encephalopathy without septic shock, due to unspecified organism ([place])  Aspiration  pneumonia of both lungs, unspecified aspiration pneumonia type, unspecified part of lung [facility])    Rx / DC Orders ED Discharge Orders    None       Rosary Filosa, Annie Main, MD 02/20/20 [id number]    Ezequiel Essex, MD 02/20/20 [phone]

## 2020-02-20 NOTE — Progress Notes (Addendum)
Pt. Right FA IV infiltrated IV removed by Tarence Searcy RN. Pt. States "I am leaving AMA after my antibiotic finishes and my husband brings my clothes." MD made aware. RN will take out other IV and notify MD when pt. Leaves.

## 2020-02-20 NOTE — Progress Notes (Signed)
  Echocardiogram 2D Echocardiogram has been performed.  Tammy [name] 02/20/2020, 8:53 AM

## 2020-02-20 NOTE — Evaluation (Signed)
Physical Therapy Evaluation Patient Details Name: Tammy [name] MRN: [id number] DOB: 06/17/1961 Today's Date: 02/20/2020   History of Present Illness  Tammy [name] is a 59 y.o. female with morbid obesity s/p Roux en Y bypass, back pain, depression, GERD, A. fib on Eliquis presented with generalized malaise for few days.  Clinical Impression  Patient very drowsy, arouses and indicates that she is ready to go home, will leave AMA. Patient mobilized to [facility] and a few feet to recliner. No balance losses noted but  Did not ambulate very far. Pt admitted with above diagnosis.  Pt currently with functional limitations due to the deficits listed below (see PT Problem List). Pt will benefit from skilled PT to increase their independence and safety with mobility to allow discharge to the venue listed below.       Follow Up Recommendations No PT follow up    Equipment Recommendations  None recommended by PT    Recommendations for Other Services       Precautions / Restrictions Precautions Precautions: Fall Precaution Comments: very drowsy      Mobility  Bed Mobility Overal bed mobility: Independent                Transfers Overall transfer level: Needs assistance   Transfers: Sit to/from Stand Sit to Stand: Min guard            Ambulation/Gait Ambulation/Gait assistance: Min guard Gait Distance (Feet): 6 Feet Assistive device: None Gait Pattern/deviations: Step-to pattern     General Gait Details: patient reports thjat she does not need assistance,  Stairs            Wheelchair Mobility    Modified Rankin (Stroke Patients Only)       Balance Overall balance assessment: No apparent balance deficits (not formally assessed)                                           Pertinent Vitals/Pain Pain Assessment: No/denies pain Pain Score: 6  Pain Location: headache, back Pain Descriptors / Indicators: Aching Pain Intervention(s): Limited  activity within patient's tolerance;Monitored during session;Repositioned    Home Living Family/patient expects to be discharged to:: Private residence   Available Help at Discharge: Family;Available 24 hours/day Type of Home: House Home Access: Stairs to enter   [employer] of Steps: 1 Home Layout: Two level;Bed/bath upstairs Home Equipment: Walker - 2 wheels;Bedside commode;Crutches;Shower seat;Wheelchair - manual      Prior Function Level of Independence: Independent         Comments: retired Scientist, forensic for [employer]     Hand Dominance   Dominant Hand: Right    Extremity/Trunk Assessment   Upper Extremity Assessment Upper Extremity Assessment: Overall WFL for tasks assessed    Lower Extremity Assessment Lower Extremity Assessment: Overall WFL for tasks assessed    Cervical / Trunk Assessment Cervical / Trunk Assessment: Normal  Communication   Communication: No difficulties  Cognition   Behavior During Therapy: Agitated;Flat affect Overall Cognitive Status: Within Functional Limits for tasks assessed                                 General Comments: reports that she is leaving AMA if she does not get DC'd. RN in room.      General Comments  Exercises     Assessment/Plan    PT Assessment Patient needs continued PT services  PT Problem List Decreased strength;Decreased mobility;Decreased activity tolerance;Decreased knowledge of use of DME;Decreased knowledge of precautions       PT Treatment Interventions Gait training;Functional mobility training;Stair training;Therapeutic activities;Therapeutic exercise;Patient/family education    PT Goals (Current goals can be found in the Care Plan section)  Acute Rehab PT Goals Patient Stated Goal: go home today PT Goal Formulation: With patient Time For Goal Achievement: 03/05/20 Potential to Achieve Goals: Good    Frequency Min 3X/week   Barriers to discharge         Co-evaluation               AM-PAC PT "6 Clicks" Mobility  Outcome Measure Help needed turning from your back to your side while in a flat bed without using bedrails?: None Help needed moving from lying on your back to sitting on the side of a flat bed without using bedrails?: None Help needed moving to and from a bed to a chair (including a wheelchair)?: A Little Help needed standing up from a chair using your arms (e.g., wheelchair or bedside chair)?: A Little Help needed to walk in hospital room?: A Little Help needed climbing 3-5 steps with a railing? : A Lot 6 Click Score: 19    End of Session   Activity Tolerance: Patient tolerated treatment well Patient left: in chair;with call bell/phone within reach;with chair alarm set Nurse Communication: Mobility status PT Visit Diagnosis: Unsteadiness on feet (R26.81)    Time: [id number] PT Time Calculation (min) (ACUTE ONLY): 16 min   Charges:   PT Evaluation $PT Eval Low Complexity: [place] Pager [phone] Office [phone]   Claretha Cooper 02/20/2020, 2:01 PM

## 2020-02-20 NOTE — Progress Notes (Signed)
Pharmacy Antibiotic Note  Tammy [name] is a 59 y.o. female admitted on 02/19/2020 with aspiration pneumonia.  Pharmacy has been consulted for Unasyn dosing.  Plan: Unasyn 3g IV q6 per current renal function  Height: 5\' 6"  (167.6 cm) Weight: (!) 140.2 kg (309 lb 1.4 oz) IBW/kg (Calculated) : 59.3  Temp (24hrs), Avg:100.2 F (37.9 C), Min:98.2 F (36.8 C), Max:103.1 F (39.5 C)  Recent Labs  Lab 02/20/20 0008 02/20/20 0330  WBC 14.6*  --   CREATININE 0.92  --   LATICACIDVEN 2.4* 1.7    Estimated Creatinine Clearance: 98.8 mL/min (by C-G formula based on SCr of 0.92 mg/dL).    Allergies  Allergen Reactions  . Nsaids Other (See Comments)    Post Gastric Bypass  . Adhesive [Tape] Itching and Rash  . Latex Itching and Rash  . Sulfa Antibiotics Itching and Rash    Thank you for allowing pharmacy to be a part of this patient's care.  Kara Mead 02/20/2020 8:26 AM

## 2020-02-20 NOTE — H&P (Addendum)
History and Physical    Tammy [name] [id number] DOB: 07-12-1961 DOA: 02/19/2020  PCP: Fanny Bien, MD Patient coming from: Home  I have personally briefly reviewed patient's old medical records in [place]  Chief Complaint: Patient arrived through EMS with altered mentation, back pain and malaise.  HPI: Tammy [name] is a 59 y.o. female with morbid obesity s/p Roux en Y bypass, back pain, depression, GERD, A. fib on Eliquis presented with generalized malaise for few days.  Husband called EMS after noticed increasing confusion and dyspnea.  Reportedly patient in the bed not engaging in activity which got him concerned.  He was also concerned that symptoms possibly from a UTI. Reportedly symptoms started after mowing the lawn few days ago. Patient does report dry cough with wheezing however denies any chest pain, abdominal pain or dysuria. Does admit to fever and chills.  Personal social history: Married with 3 children.  Does not work.  No history of smoking drinking or drug use.  Family history: Dad had CABG.   ED Course:  Vitals with 115/67, HR 101, T-max 103.1  -Labs with lactic acid 2.4, sodium 135, potassium 3.9, BUN 14, creatinine 0.9, calcium 7.5, albumin 3.4 -WBC 14.6, Hb 10, MCV 77, platelets 370 -ABG 7.2 7/50/95/22   -CXR with cardiomegaly and pulmonary edema -CT with renal protocol with patchy lung base consolidation may reflect bilateral pneumonia or possible aspiration.  Also noted was dilated jejunal loops with air distention precluded: Suggestive of ileus or enteritis.  No evidence of obstruction.  -HCT unremarkable  Meds in ED: 2 L normal saline. Cefepime Flagyl. IV vancomycin.   Review of Systems: As per HPI otherwise 10 point review of systems negative.  Review of Systems  Constitutional: Positive for chills, fever and malaise/fatigue. Negative for diaphoresis and weight loss.  HENT: Negative.   Eyes: Negative.   Respiratory:  Positive for cough and shortness of breath. Negative for hemoptysis, sputum production and wheezing.   Cardiovascular: Negative for chest pain, palpitations, orthopnea, claudication, leg swelling and PND.  Gastrointestinal: Negative for abdominal pain, blood in stool, constipation, diarrhea, heartburn, melena, nausea and vomiting.  Genitourinary: Negative.   Musculoskeletal: Negative.   Skin: Negative.   Neurological: Negative.   Endo/Heme/Allergies: Negative.   Psychiatric/Behavioral: Positive for depression. Negative for hallucinations, substance abuse and suicidal ideas. The patient is not nervous/anxious and does not have insomnia.     Past Medical History:  Diagnosis Date  . Anxiety   . Arthritis    neck and lower back - deg disc disease, fingers  . Depression   . Frequency of urination   . GERD (gastroesophageal reflux disease)   . History of seizure per pt no seizure since    11-07-2015  in setting of stress and sleep deprivation  /  negative work-up by neurologist unknown idiology  . Hypothyroidism   . Injury of thumb, left    03-31-2016    . Pelvic relaxation   . SUI (stress urinary incontinence, female)     Past Surgical History:  Procedure Laterality Date  . ANTERIOR AND POSTERIOR REPAIR  09/25/2012   Procedure: ANTERIOR (CYSTOCELE) AND POSTERIOR REPAIR (RECTOCELE);  Surgeon: Lahoma Crocker, MD;  Location: [facility];  Service: Gynecology;  Laterality: N/A;  . ANTERIOR AND POSTERIOR REPAIR WITH SACROSPINOUS FIXATION N/A 04/12/2016   Procedure: ANTERIOR AND POSTERIOR REPAIR WITH SACROSPINOUS LIGAMENT SUSPENSION, CYSTOSCOPY;  Surgeon: Arvella Nigh, MD;  Location: [facility];  Service: Gynecology;  Laterality: N/A;  .  APPENDECTOMY  1987  . BREAST EXCISIONAL BIOPSY Left    No scar visable   . BREAST REDUCTION SURGERY  2000  aprrox  . ENDOMETRIAL ABLATION W/ NOVASURE  2011   in office  . EXCISION LEFT BREAST BX  12-07-1999   benign  . LAPAROSCOPIC  CHOLECYSTECTOMY  02-09-2005  . LAPAROSCOPIC VAGINAL HYSTERECTOMY WITH SALPINGO OOPHORECTOMY Bilateral 04/12/2016   Procedure: LAPAROSCOPIC ASSISTED VAGINAL HYSTERECTOMY WITH SALPINGO OOPHORECTOMY;  Surgeon: Arvella Nigh, MD;  Location: [facility];  Service: Gynecology;  Laterality: Bilateral;  . REDUCTION MAMMAPLASTY Bilateral   . ROUX-EN-Y GASTRIC BYPASS  06/  2002  . TUBAL LIGATION  10/ 1990     reports that she has never smoked. She has never used smokeless tobacco. She reports that she does not drink alcohol or use drugs.  Allergies  Allergen Reactions  . Nsaids Other (See Comments)    Post Gastric Bypass  . Adhesive [Tape] Itching and Rash  . Latex Itching and Rash  . Sulfa Antibiotics Itching and Rash    Family History  Problem Relation Age of Onset  . Colon polyps Mother   . Heart disease Father 67       CABG  . Breast cancer Maternal Aunt   . Stomach cancer Maternal Grandmother   . Diabetes Maternal Grandmother   . Heart disease Maternal Grandmother   . Stomach cancer Maternal Uncle   . Colon cancer Maternal Uncle        x3  . Diabetes Maternal Aunt        Type I  . Diabetes Maternal Uncle        x5 - Type II  . Heart disease Paternal Grandmother   . Esophageal cancer Neg Hx   . Gallbladder disease Neg Hx   . Seizures Neg Hx      Prior to Admission medications   Medication Sig Start Date End Date Taking? Authorizing Provider  apixaban (ELIQUIS) 5 MG TABS tablet Take 1 tablet (5 mg total) by mouth 2 (two) times daily. 09/03/19 02/20/20 Yes Lendon Colonel, NP  buPROPion (WELLBUTRIN XL) 150 MG 24 hr tablet Take 450 mg by mouth daily. 12/17/19  Yes [provider]  buPROPion (WELLBUTRIN XL) 300 MG 24 hr tablet Take 300 mg by mouth daily.  07/26/18  Yes [provider]  clonazePAM (KLONOPIN) 0.5 MG tablet Take 0.5 tablets (0.25 mg total) by mouth 3 (three) times daily as needed for anxiety. 10/28/19  Yes Bonnell Public, MD   Diclofenac Sodium (PENNSAID) 2 % SOLN Apply 2 g topically 2 (two) times daily as needed (to affected area). 01/08/20  Yes Leandrew Koyanagi, MD  diclofenac Sodium (VOLTAREN) 1 % GEL Apply 2 g topically daily as needed (pain).  11/10/15  Yes [provider]  FLUoxetine (PROZAC) 20 MG capsule Take 20 mg by mouth at bedtime.  07/26/18  Yes [provider]  furosemide (LASIX) 20 MG tablet TAKE ONE TABLET BY MOUTH TWICE A DAY Patient taking differently: Take 20 mg by mouth 2 (two) times daily.  02/11/20  Yes Sherran Needs, NP  gabapentin (NEURONTIN) 600 MG tablet Take 600 mg by mouth 3 (three) times daily.   Yes [provider]  metoprolol succinate (TOPROL-XL) 25 MG 24 hr tablet TAKE ONE TABLET BY MOUTH AT BEDTIME Patient taking differently: Take 25 mg by mouth at bedtime.  02/05/20  Yes Sherran Needs, NP  OLANZapine (ZYPREXA) 10 MG tablet Take 10 mg by mouth  at bedtime. 04/04/19  Yes [provider]  potassium chloride SA (KLOR-CON M20) 20 MEQ tablet Take 1 tablet (20 mEq total) by mouth daily. 11/16/19  Yes Sherran Needs, NP  diltiazem (CARDIZEM CD) 300 MG 24 hr capsule Take 1 capsule (300 mg total) by mouth daily. Patient not taking: Reported on 01/22/2020 09/04/19 01/22/20  Minus Breeding, MD  gabapentin (NEURONTIN) 300 MG capsule Take 1 capsule (300 mg total) by mouth 2 (two) times daily. Patient not taking: Reported on 02/20/2020 10/28/19   Bonnell Public, MD    Physical Exam: Vitals:   02/20/20 0009 02/20/20 0030 02/20/20 0130  BP: 115/67 128/85 (!) 103/91  Pulse: (!) 101 95 90  Resp: (!) 24 20 18   Temp: (!) 103.1 F (39.5 C)    TempSrc: Oral    SpO2: 90% 97% 98%    Constitutional: NAD, calm, comfortable Vitals:   02/20/20 0009 02/20/20 0030 02/20/20 0130  BP: 115/67 128/85 (!) 103/91  Pulse: (!) 101 95 90  Resp: (!) 24 20 18   Temp: (!) 103.1 F (39.5 C)    TempSrc: Oral    SpO2: 90% 97% 98%   Physical Exam  Constitutional: She appears  well-developed and well-nourished. No distress.  Morbidly obese.  Lethargic but easily arousable and responds appropriately to questions.  HENT:  Head: Normocephalic and atraumatic.  Right Ear: External ear normal.  Left Ear: External ear normal.  Mouth/Throat: No oropharyngeal exudate.  Eyes: Pupils are equal, round, and reactive to light. EOM are normal. Right eye exhibits no discharge. Left eye exhibits no discharge. No scleral icterus.  Neck: No JVD present.  Cardiovascular: Normal rate, regular rhythm and normal heart sounds. Exam reveals no gallop and no friction rub.  No murmur heard. Pulmonary/Chest: No respiratory distress. She has no wheezes. She has rales. She exhibits no tenderness.  Abdominal: Soft. Bowel sounds are normal. She exhibits distension. She exhibits no mass. There is no abdominal tenderness. There is no rebound and no guarding.  Musculoskeletal:        General: No tenderness or edema. Normal range of motion.     Cervical back: Normal range of motion and neck supple.  Lymphadenopathy:    She has no cervical adenopathy.  Neurological: No cranial nerve deficit. Coordination normal.  Lethargic but easily arousable.  Skin: Skin is warm and dry. No rash noted. No erythema. No pallor.  Vitals reviewed.    Labs on Admission: I have personally reviewed following labs and imaging studies  CBC: Recent Labs  Lab 02/20/20 0008  WBC 14.6*  NEUTROABS 12.8*  HGB 10.0*  HCT 34.7*  MCV 77.1*  PLT 0000000   Basic Metabolic Panel: Recent Labs  Lab 02/20/20 0008  NA 135  K 3.9  CL 104  CO2 23  GLUCOSE 104*  BUN 14  CREATININE 0.92  CALCIUM 7.5*   GFR: CrCl cannot be calculated (Unknown ideal weight.). Liver Function Tests: Recent Labs  Lab 02/20/20 0008  AST 17  ALT 15  ALKPHOS 104  BILITOT 0.4  PROT 6.7  ALBUMIN 3.4*   No results for input(s): LIPASE, AMYLASE in the last 168 hours. Recent Labs  Lab 02/20/20 0153  AMMONIA 9   Coagulation  Profile: Recent Labs  Lab 02/20/20 0008  INR 1.2   Cardiac Enzymes: No results for input(s): CKTOTAL, CKMB, CKMBINDEX, TROPONINI in the last 168 hours. BNP (last 3 results) No results for input(s): PROBNP in the last 8760 hours. HbA1C: No results for input(s): HGBA1C  in the last 72 hours. CBG: No results for input(s): GLUCAP in the last 168 hours. Lipid Profile: No results for input(s): CHOL, HDL, LDLCALC, TRIG, CHOLHDL, LDLDIRECT in the last 72 hours. Thyroid Function Tests: No results for input(s): TSH, T4TOTAL, FREET4, T3FREE, THYROIDAB in the last 72 hours. Anemia Panel: No results for input(s): VITAMINB12, FOLATE, FERRITIN, TIBC, IRON, RETICCTPCT in the last 72 hours. Urine analysis:    Component Value Date/Time   COLORURINE YELLOW 10/27/2019 0117   APPEARANCEUR HAZY (A) 10/27/2019 0117   LABSPEC 1.013 10/27/2019 0117   PHURINE 5.0 10/27/2019 0117   GLUCOSEU NEGATIVE 10/27/2019 0117   HGBUR MODERATE (A) 10/27/2019 0117   BILIRUBINUR NEGATIVE 10/27/2019 0117   KETONESUR NEGATIVE 10/27/2019 0117   PROTEINUR NEGATIVE 10/27/2019 0117   UROBILINOGEN 1.0 03/13/2017 1637   NITRITE POSITIVE (A) 10/27/2019 0117   LEUKOCYTESUR SMALL (A) 10/27/2019 0117    Radiological Exams on Admission: CT Head Wo Contrast  Result Date: 02/20/2020 CLINICAL DATA:  Altered mental status EXAM: CT HEAD WITHOUT CONTRAST TECHNIQUE: Contiguous axial images were obtained from the base of the skull through the vertex without intravenous contrast. COMPARISON:  CT brain 10/27/2019 FINDINGS: Brain: No acute territorial infarction, hemorrhage, or intracranial mass. Stable ventricle size with slight asymmetric prominence of the right lateral ventricle. Vascular: No hyperdense vessels.  No unexpected calcification Skull: Normal. Negative for fracture or focal lesion. Sinuses/Orbits: No acute finding. Other: None IMPRESSION: Negative.  No CT evidence for acute intracranial abnormality Electronically Signed   By:  Donavan Foil M.D.   On: 02/20/2020 01:24   DG Chest Port 1 View  Result Date: 02/20/2020 CLINICAL DATA:  Sepsis EXAM: PORTABLE CHEST 1 VIEW COMPARISON:  11/07/2019 FINDINGS: Cardiomegaly with vascular congestion and hazy bilateral opacities suspicious for edema. No pleural effusion or pneumothorax. Aortic atherosclerosis IMPRESSION: Cardiomegaly with vascular congestion and hazy pulmonary edema Electronically Signed   By: Donavan Foil M.D.   On: 02/20/2020 01:38   CT Renal Stone Study  Result Date: 02/20/2020 CLINICAL DATA:  Flank pain back pain EXAM: CT ABDOMEN AND PELVIS WITHOUT CONTRAST TECHNIQUE: Multidetector CT imaging of the abdomen and pelvis was performed following the standard protocol without IV contrast. COMPARISON:  CT 04/14/2019 FINDINGS: Lower chest: Lung bases demonstrate patchy consolidations and ground-glass densities within the bilateral lower lobes with slight nodularity. No pleural effusion. Borderline cardiomegaly. Hepatobiliary: No focal liver abnormality is seen. Status post cholecystectomy. No biliary dilatation. Pancreas: Unremarkable. No pancreatic ductal dilatation or surrounding inflammatory changes. Spleen: Normal in size without focal abnormality. Adrenals/Urinary Tract: Adrenal glands are unremarkable. Kidneys are normal, without renal calculi, focal lesion, or hydronephrosis. Bladder is unremarkable. Stomach/Bowel: Status post gastric bypass. Fluid-filled slightly dilated jejunal loops with gradual transition to non dilated mid and distal small bowel. Mild gaseous dilatation of the colon as well. No bowel wall thickening. Status post appendectomy. Vascular/Lymphatic: No significant vascular findings are present. No enlarged abdominal or pelvic lymph nodes. Reproductive: Status post hysterectomy. No adnexal masses. Other: Negative for free air or free fluid. Musculoskeletal: Degenerative changes at L4-L5 and L5-S1. No acute or suspicious osseous abnormality. IMPRESSION: 1.  Patchy bilateral lung base consolidations which may reflect bilateral pneumonia or possible aspiration. 2. Scattered fluid-filled slightly dilated jejunal loops with air distension of colon, suggestive of ileus or enteritis. No convincing evidence for obstruction Electronically Signed   By: Donavan Foil M.D.   On: 02/20/2020 01:31    EKG: Independently reviewed.  NSR at 94 bpm, normal axis, no ST-T wave changes.  Q's inferior leads.  Intervals within normal limits.  Assessment/Plan Active Problems:   Aspiration pneumonia (HCC)   Depression   Respiratory failure (HCC)   Atrial fibrillation with RVR (HCC)   Morbid obesity with BMI of 50.0-59.9, adult (HCC)   Lactic acidosis   Acute metabolic encephalopathy   Sepsis ([place])   #Sepsis with lactic acidosis 2.4 WBC 14.6, tachycardia, fever 103 -CT with bilateral pneumonia possible aspiration -Covid pending -UA pending.  Blood cultures pending -Continue with ceftriaxone/Flagyl   #Ileus versus enteritis -Nonspecific.  Will monitor.  #A. Fib -Continue with Eliquis -Continue with Cardizem and Toprol -We will hold Lasix for now and restart once vitals stabilize. .  # Hypercarbic resp failure requiring 2 LNC  -VBG 7.27/50/95/22 -Likely from encephalopathy -CPAP as needed  #Encephalopathy acute -Suspect toxic from multiple psychoactive medications that led to altered mentation and aspiration.  Also explains hypercarbic respiratory failure. -HCT unremarkable -Ammonia level 9 -We will hold Wellbutrin, Klonopin, Zyprexa, gabapentin and Prozac. -She has been advised to follow-up with psychiatry for review of her antidepressant regimen.   #CPAP  #Microcytic anemia Hb 10, MCV 77, RDW 17 -Iron panel pending.   DVT prophylaxis: Patient already on Eliquis   code Status: Full code  Family Communication: Husband at bedside.  Disposition Plan: Home versus home with PT.    Consults called: None Admission status:  Inpatient   Zulay Corrie MD [facility]   If 7PM-7AM, please contact night-coverage [email or web address] Password [email or web address]  02/20/2020, 2:30 AM

## 2020-02-20 NOTE — Progress Notes (Signed)
PROGRESS NOTE    Tammy [name]  [id number] DOB: 03-29-1961 DOA: 02/19/2020 PCP: Fanny Bien, MD   Brief Narrative: Patient is a 59 year old female with history of morbid obesity status post gastric bypass, chronic back pain, depression, GERD, A. fib on Eliquis who presented with general malaise.  She was also increasingly confused, dyspneic at home.  The symptoms started after mowing the lawn few days ago.  She reported dry cough with wheezing.  Also complained of fever and chills at home.  On presentation, chest x-ray showed cardiomegaly with vascular congestion, pulmonary edema also showed bilateral lung base consolidation, hazy opacities.  Covid PCR negative.  Lactic acid was elevated 2.4.  She had leukocytosis and fever of 103 F on presentation.  Assessment & Plan:   Active Problems:   Depression   Respiratory failure (HCC)   Atrial fibrillation with RVR (HCC)   Morbid obesity with BMI of 50.0-59.9, adult (HCC)   Aspiration pneumonia (HCC)   Lactic acidosis   Acute metabolic encephalopathy   Sepsis ([place])    Sepsis: Lactic acid was elevated 2.4.  She had leukocytosis and fever of 103 F on presentation. Follow-up cultures.  Started on antibiotics.  This morning she was hemodynamically stable, afebrile.  Pneumonia/possible aspiration: Chest x-ray showed bilateral lung base consolidation, hazy opacities.  She could have aspirated as she was confused. Covid PCR negative.  Currently on ceftriaxone, Flagyl.  Will change to Unasyn to cover for aspiration pneumonia.  Speech therapy   Consulted,plan for MBS.  Acute respiratory failure with hypoxia: Currently requiring 2 L of oxygen per minute.  Likely secondary from pneumonia.  Continue to monitor respiratory status.  We will try to wean the oxygen to off.  Ileus/enteritis: Incidental finding denies any abdominal pain. No nausea or vomiting. Continue to monitor.  Paroxysmal A. fib: On Eliquis for anticoagulation.  On Cardizem  and Toprol for rate control.  Pulmonary edema: Restart Lasix from home.   Echocardiogram done here showed ejection fraction of 66 -65%, normal left ventricular function.  Findings are similar to the previous echo done in 2020.  Acute encephalopathy: She was on multiple psychoactive medication at home.  Head CT was unremarkable.  Ammonia level normal.  On Wellbutrin, Klonopin, Zyprexa, gabapentin, Prozac at home which are on hold.  Most likely this is secondary to her polypharmacy.  Microcytic anemia: Hemoglobin of 10.  Severely deficient  with iron of 17.  Will transfuse with a dose of IV iron.           DVT prophylaxis:Eliquis Code Status: Full Family Communication: Discussed with the patient. Status is: Inpatient  Remains inpatient appropriate because:IV treatments appropriate due to intensity of illness or inability to take PO   Dispo: The patient is from: Home              Anticipated d/c is to: Home              Anticipated d/c date is: 2 days              Patient currently is not medically stable to d/c.    Consultants: None  Procedures:None  Antimicrobials:  Anti-infectives (From admission, onward)   Start     Dose/Rate Route Frequency Ordered Stop   02/20/20 0800  cefTRIAXone (ROCEPHIN) 1 g in sodium chloride 0.9 % 100 mL IVPB     1 g 200 mL/hr over 30 Minutes Intravenous Every 24 hours 02/20/20 0250     02/20/20 0600  metroNIDAZOLE (  FLAGYL) IVPB 500 mg     500 mg 100 mL/hr over 60 Minutes Intravenous Every 8 hours 02/20/20 0250     02/20/20 0015  ceFEPIme (MAXIPIME) 2 g in sodium chloride 0.9 % 100 mL IVPB     2 g 200 mL/hr over 30 Minutes Intravenous  Once 02/20/20 0008 02/20/20 0239   02/20/20 0015  metroNIDAZOLE (FLAGYL) IVPB 500 mg     500 mg 100 mL/hr over 60 Minutes Intravenous  Once 02/20/20 0008 02/20/20 0239   02/20/20 0015  vancomycin (VANCOCIN) IVPB 1000 mg/200 mL premix     1,000 mg 200 mL/hr over 60 Minutes Intravenous  Once 02/20/20 0008  02/20/20 0348      Subjective: Patient seen and examined the bedside this morning.  Hemodynamically stable.  Looks more comfortable but is still very sleepy and drowsy.  Alert and oriented.  Denies any nausea, vomiting or abdominal  Objective: Vitals:   02/20/20 0230 02/20/20 0400 02/20/20 0500 02/20/20 0530  BP: (!) 105/59 119/76    Pulse: 84 84  75  Resp: 15 14  19   Temp:   99.3 F (37.4 C)   TempSrc:   Oral   SpO2: 95% 95%  98%  Weight:   (!) 140.2 kg   Height:   5\' 6"  (1.676 m)     Intake/Output Summary (Last 24 hours) at 02/20/2020 0754 Last data filed at 02/20/2020 0542 Gross per 24 hour  Intake 2640 ml  Output --  Net 2640 ml   Filed Weights   02/20/20 0500  Weight: (!) 140.2 kg    Examination:  General exam: Not in apparent distress, morbidly obese, sleepy Respiratory system: Bilateral equal air entry, normal vesicular breath sounds, no wheezes or crackles  Cardiovascular system: S1 & S2 heard, RRR. No JVD, murmurs, rubs, gallops or clicks. No pedal edema. Gastrointestinal system: Abdomen is nondistended, soft and nontender. No organomegaly or masses felt. Normal bowel sounds heard. Central nervous system: Alert and oriented. No focal neurological deficits. Extremities: 1+ pedal  edema, no clubbing ,no cyanosis Skin: No rashes, lesions or ulcers,no icterus ,no pallor   Data Reviewed: I have personally reviewed following labs and imaging studies  CBC: Recent Labs  Lab 02/20/20 0008  WBC 14.6*  NEUTROABS 12.8*  HGB 10.0*  HCT 34.7*  MCV 77.1*  PLT 0000000   Basic Metabolic Panel: Recent Labs  Lab 02/20/20 0008  NA 135  K 3.9  CL 104  CO2 23  GLUCOSE 104*  BUN 14  CREATININE 0.92  CALCIUM 7.5*   GFR: Estimated Creatinine Clearance: 98.8 mL/min (by C-G formula based on SCr of 0.92 mg/dL). Liver Function Tests: Recent Labs  Lab 02/20/20 0008  AST 17  ALT 15  ALKPHOS 104  BILITOT 0.4  PROT 6.7  ALBUMIN 3.4*   No results for input(s):  LIPASE, AMYLASE in the last 168 hours. Recent Labs  Lab 02/20/20 0153  AMMONIA 9   Coagulation Profile: Recent Labs  Lab 02/20/20 0008  INR 1.2   Cardiac Enzymes: No results for input(s): CKTOTAL, CKMB, CKMBINDEX, TROPONINI in the last 168 hours. BNP (last 3 results) No results for input(s): PROBNP in the last 8760 hours. HbA1C: No results for input(s): HGBA1C in the last 72 hours. CBG: No results for input(s): GLUCAP in the last 168 hours. Lipid Profile: No results for input(s): CHOL, HDL, LDLCALC, TRIG, CHOLHDL, LDLDIRECT in the last 72 hours. Thyroid Function Tests: Recent Labs    02/20/20 0153  TSH 2.012  Anemia Panel: Recent Labs    02/20/20 0153  FERRITIN 10*  TIBC 487*  IRON 17*   Sepsis Labs: Recent Labs  Lab 02/20/20 0008 02/20/20 0330  LATICACIDVEN 2.4* 1.7    Recent Results (from the past 240 hour(s))  SARS Coronavirus 2 by RT PCR (hospital order, performed in [facility] hospital lab) Nasopharyngeal Nasopharyngeal Swab     Status: None   Collection Time: 02/20/20  4:44 AM   Specimen: Nasopharyngeal Swab  Result Value Ref Range Status   SARS Coronavirus 2 NEGATIVE NEGATIVE Final    Comment: (NOTE) SARS-CoV-2 target nucleic acids are NOT DETECTED. The SARS-CoV-2 RNA is generally detectable in upper and lower respiratory specimens during the acute phase of infection. The lowest concentration of SARS-CoV-2 viral copies this assay can detect is 250 copies / mL. A negative result does not preclude SARS-CoV-2 infection and should not be used as the sole basis for treatment or other patient management decisions.  A negative result may occur with improper specimen collection / handling, submission of specimen other than nasopharyngeal swab, presence of viral mutation(s) within the areas targeted by this assay, and inadequate number of viral copies (<250 copies / mL). A negative result must be combined with clinical observations, patient history, and  epidemiological information. Fact Sheet for Patients:   StrictlyIdeas.no Fact Sheet for Healthcare Providers: BankingDealers.co.za This test is not yet approved or cleared  by the [place] FDA and has been authorized for detection and/or diagnosis of SARS-CoV-2 by FDA under an Emergency Use Authorization (EUA).  This EUA will remain in effect (meaning this test can be used) for the duration of the COVID-19 declaration under Section 564(b)(1) of the Act, 21 U.S.C. section 360bbb-3(b)(1), unless the authorization is terminated or revoked sooner. Performed at [facility], [place] [address] [place], [place] [address]   MRSA PCR Screening     Status: None   Collection Time: 02/20/20  5:42 AM   Specimen: Nasal Mucosa; Nasopharyngeal  Result Value Ref Range Status   MRSA by PCR NEGATIVE NEGATIVE Final    Comment:        The GeneXpert MRSA Assay (FDA approved for NASAL specimens only), is one component of a comprehensive MRSA colonization surveillance program. It is not intended to diagnose MRSA infection nor to guide or monitor treatment for MRSA infections. Performed at [facility], [place] [address] [place], [place] [address]          Radiology Studies: CT Head Wo Contrast  Result Date: 02/20/2020 CLINICAL DATA:  Altered mental status EXAM: CT HEAD WITHOUT CONTRAST TECHNIQUE: Contiguous axial images were obtained from the base of the skull through the vertex without intravenous contrast. COMPARISON:  CT brain 10/27/2019 FINDINGS: Brain: No acute territorial infarction, hemorrhage, or intracranial mass. Stable ventricle size with slight asymmetric prominence of the right lateral ventricle. Vascular: No hyperdense vessels.  No unexpected calcification Skull: Normal. Negative for fracture or focal lesion. Sinuses/Orbits: No acute finding. Other: None IMPRESSION: Negative.  No CT evidence for acute  intracranial abnormality Electronically Signed   By: Donavan Foil M.D.   On: 02/20/2020 01:24   DG Chest Port 1 View  Result Date: 02/20/2020 CLINICAL DATA:  Sepsis EXAM: PORTABLE CHEST 1 VIEW COMPARISON:  11/07/2019 FINDINGS: Cardiomegaly with vascular congestion and hazy bilateral opacities suspicious for edema. No pleural effusion or pneumothorax. Aortic atherosclerosis IMPRESSION: Cardiomegaly with vascular congestion and hazy pulmonary edema Electronically Signed   By: Madie Reno.D.  On: 02/20/2020 01:38   CT Renal Stone Study  Result Date: 02/20/2020 CLINICAL DATA:  Flank pain back pain EXAM: CT ABDOMEN AND PELVIS WITHOUT CONTRAST TECHNIQUE: Multidetector CT imaging of the abdomen and pelvis was performed following the standard protocol without IV contrast. COMPARISON:  CT 04/14/2019 FINDINGS: Lower chest: Lung bases demonstrate patchy consolidations and ground-glass densities within the bilateral lower lobes with slight nodularity. No pleural effusion. Borderline cardiomegaly. Hepatobiliary: No focal liver abnormality is seen. Status post cholecystectomy. No biliary dilatation. Pancreas: Unremarkable. No pancreatic ductal dilatation or surrounding inflammatory changes. Spleen: Normal in size without focal abnormality. Adrenals/Urinary Tract: Adrenal glands are unremarkable. Kidneys are normal, without renal calculi, focal lesion, or hydronephrosis. Bladder is unremarkable. Stomach/Bowel: Status post gastric bypass. Fluid-filled slightly dilated jejunal loops with gradual transition to non dilated mid and distal small bowel. Mild gaseous dilatation of the colon as well. No bowel wall thickening. Status post appendectomy. Vascular/Lymphatic: No significant vascular findings are present. No enlarged abdominal or pelvic lymph nodes. Reproductive: Status post hysterectomy. No adnexal masses. Other: Negative for free air or free fluid. Musculoskeletal: Degenerative changes at L4-L5 and L5-S1. No  acute or suspicious osseous abnormality. IMPRESSION: 1. Patchy bilateral lung base consolidations which may reflect bilateral pneumonia or possible aspiration. 2. Scattered fluid-filled slightly dilated jejunal loops with air distension of colon, suggestive of ileus or enteritis. No convincing evidence for obstruction Electronically Signed   By: Donavan Foil M.D.   On: 02/20/2020 01:31        Scheduled Meds: . apixaban  5 mg Oral BID  . Chlorhexidine Gluconate Cloth  6 each Topical Daily  . metoprolol succinate  25 mg Oral QHS   Continuous Infusions: . cefTRIAXone (ROCEPHIN)  IV    . metronidazole Stopped (02/20/20 0641)     LOS: 0 days    Time spent: 25 mins.More than 50% of that time was spent in counseling and/or coordination of care.      Shelly Coss, MD [facility] P5/19/2021, 7:54 AM

## 2020-02-21 LAB — URINE CULTURE: Culture: NO GROWTH

## 2020-02-22 ENCOUNTER — Emergency Department (HOSPITAL_COMMUNITY)
Admission: EM | Admit: 2020-02-22 | Discharge: 2020-02-22 | Disposition: A | Discharge status: Home / Self Care | Payer: 59 | Attending: Emergency Medicine | Admitting: Emergency Medicine

## 2020-02-22 ENCOUNTER — Emergency Department (HOSPITAL_COMMUNITY): Payer: 59

## 2020-02-22 ENCOUNTER — Other Ambulatory Visit: Payer: Self-pay

## 2020-02-22 ENCOUNTER — Encounter (HOSPITAL_COMMUNITY): Payer: Self-pay

## 2020-02-22 DIAGNOSIS — J690 Pneumonitis due to inhalation of food and vomit: Secondary | ICD-10-CM | POA: Diagnosis not present

## 2020-02-22 DIAGNOSIS — Z7901 Long term (current) use of anticoagulants: Secondary | ICD-10-CM | POA: Insufficient documentation

## 2020-02-22 DIAGNOSIS — Z79899 Other long term (current) drug therapy: Secondary | ICD-10-CM | POA: Insufficient documentation

## 2020-02-22 DIAGNOSIS — E039 Hypothyroidism, unspecified: Secondary | ICD-10-CM | POA: Diagnosis not present

## 2020-02-22 DIAGNOSIS — Z91040 Latex allergy status: Secondary | ICD-10-CM | POA: Insufficient documentation

## 2020-02-22 DIAGNOSIS — R0602 Shortness of breath: Secondary | ICD-10-CM | POA: Diagnosis present

## 2020-02-22 DIAGNOSIS — R400 Somnolence: Secondary | ICD-10-CM

## 2020-02-22 LAB — COMPREHENSIVE METABOLIC PANEL
ALT: 16 U/L (ref 0–44)
AST: 25 U/L (ref 15–41)
Albumin: 3.6 g/dL (ref 3.5–5.0)
Alkaline Phosphatase: 95 U/L (ref 38–126)
Anion gap: 8 (ref 5–15)
BUN: 10 mg/dL (ref 6–20)
CO2: 30 mmol/L (ref 22–32)
Calcium: 8.5 mg/dL — ABNORMAL LOW (ref 8.9–10.3)
Chloride: 103 mmol/L (ref 98–111)
Creatinine, Ser: 0.88 mg/dL (ref 0.44–1.00)
GFR calc Af Amer: >60 mL/min (ref >60)
GFR calc non Af Amer: >60 mL/min (ref >60)
Glucose, Bld: 97 mg/dL (ref 70–99)
Potassium: 3.8 mmol/L (ref 3.5–5.1)
Sodium: 141 mmol/L (ref 135–145)
Total Bilirubin: 0.5 mg/dL (ref 0.3–1.2)
Total Protein: 6.9 g/dL (ref 6.5–8.1)

## 2020-02-22 LAB — CBC WITH DIFFERENTIAL/PLATELET
Abs Immature Granulocytes: 0.03 10*3/uL (ref 0.00–0.07)
Basophils Absolute: 0 10*3/uL (ref 0.0–0.1)
Basophils Relative: 0 %
Eosinophils Absolute: 0.2 10*3/uL (ref 0.0–0.5)
Eosinophils Relative: 3 %
HCT: 30.7 % — ABNORMAL LOW (ref 36.0–46.0)
Hemoglobin: 8.9 g/dL — ABNORMAL LOW (ref 12.0–15.0)
Immature Granulocytes: 0 %
Lymphocytes Relative: 24 %
Lymphs Abs: 2.1 10*3/uL (ref 0.7–4.0)
MCH: 22.6 pg — ABNORMAL LOW (ref 26.0–34.0)
MCHC: 29 g/dL — ABNORMAL LOW (ref 30.0–36.0)
MCV: 78.1 fL — ABNORMAL LOW (ref 80.0–100.0)
Monocytes Absolute: 0.7 10*3/uL (ref 0.1–1.0)
Monocytes Relative: 8 %
Neutro Abs: 5.8 10*3/uL (ref 1.7–7.7)
Neutrophils Relative %: 65 %
Platelets: 358 10*3/uL (ref 150–400)
RBC: 3.93 MIL/uL (ref 3.87–5.11)
RDW: 17.1 % — ABNORMAL HIGH (ref 11.5–15.5)
WBC: 8.9 10*3/uL (ref 4.0–10.5)
nRBC: 0 % (ref 0.0–0.2)

## 2020-02-22 LAB — RAPID URINE DRUG SCREEN, HOSP PERFORMED
Amphetamines: NOT DETECTED
Barbiturates: NOT DETECTED
Benzodiazepines: POSITIVE — AB
Cocaine: NOT DETECTED
Opiates: POSITIVE — AB
Tetrahydrocannabinol: NOT DETECTED

## 2020-02-22 LAB — BLOOD GAS, VENOUS
Acid-Base Excess: 1.5 mmol/L (ref 0.0–2.0)
Bicarbonate: 28.1 mmol/L — ABNORMAL HIGH (ref 20.0–28.0)
FIO2: 21
O2 Saturation: 25.8 %
Patient temperature: 98.6
pCO2, Ven: 59.1 mmHg (ref 44.0–60.0)
pH, Ven: 7.299 (ref 7.250–7.430)
pO2, Ven: <31 mmHg — CL (ref 32.0–45.0)

## 2020-02-22 LAB — PROTIME-INR
INR: 1.1 (ref 0.8–1.2)
Prothrombin Time: 13.6 seconds (ref 11.4–15.2)

## 2020-02-22 LAB — LACTIC ACID, PLASMA: Lactic Acid, Venous: 1 mmol/L (ref 0.5–1.9)

## 2020-02-22 MED ORDER — IOHEXOL 350 MG/ML SOLN
100.0000 mL | Freq: Once | INTRAVENOUS | Status: AC | PRN
  Administered 2020-02-22: 100 mL via INTRAVENOUS

## 2020-02-22 MED ORDER — SODIUM CHLORIDE (PF) 0.9 % IJ SOLN
INTRAMUSCULAR | Status: AC
  Filled 2020-02-22: qty 50

## 2020-02-22 MED ORDER — AMOXICILLIN-POT CLAVULANATE 875-125 MG PO TABS
1.0000 | ORAL_TABLET | Freq: Two times a day (BID) | ORAL | 0 refills | Status: AC
Start: 2020-02-22 — End: 2020-02-29

## 2020-02-22 NOTE — Discharge Instructions (Signed)
I have provided a prescription for antibiotics, please take 1 tablet daily for the next 7 days.   Please complete the changes in medication to Dr. Myna Hidalgo discussed with you.  If you experience any worsening symptoms, shortness of breath please return to the ED.

## 2020-02-22 NOTE — ED Provider Notes (Signed)
Medical screening examination/treatment/procedure(s) were conducted as a shared visit with non-physician practitioner(s) and myself.  I personally evaluated the patient during the encounter.  EKG Interpretation  Date/Time:  Friday Feb 22 2020 15:58:04 EDT Ventricular Rate:  72 PR Interval:    QRS Duration: 101 QT Interval:  402 QTC Calculation: 440 R Axis:   41 Text Interpretation: Sinus rhythm Borderline T abnormalities, anterior leads No significant change since last tracing Confirmed by Lacretia Leigh ([id number]) on 02/22/2020 4:67:62 PM 60 year old female who presents with increasing shortness of breath.  Left AMA from the hospital 2 days ago where she has been treated for pneumonia.  She is sleepy here and does admit to chronic use of opiates.  Chest x-ray without acute findings and will order chest CT and disposition pending   Lacretia Leigh, MD 02/22/20 1717

## 2020-02-22 NOTE — ED Provider Notes (Signed)
[facility] DEPT Provider Note   CSN: [id number] Arrival date & time: 02/22/20  1410     History Chief Complaint  Patient presents with  . Shortness of Breath    Tammy [name] is a 59 y.o. female.  59 y.o female with a PMH of Anxiety, Aspiration pneumonia presents to the ED with chief complaint of worsening shortness of breath. Patient was admitted due to aspiration pneumonia on 02/20/2020, she left AMA the same day.  Returns today as she was seen by PCP this morning, referred to the ED as her condition was likely worsening.  According to daughter who is providing most of the history, she is had increasing shortness of breath, increase in muscle ache and fatigue.  Does have a dry cough.  Daughter reports that she is unable to stay awake during conversations.  She has also been checking her vitals at home, reports yesterday she found patient to be very hypotensive with a systolic in the Q000111Q.  Patient is agreeable to staying for treatment today.  Does have a resting oxygen saturation of 94%.  Denies any chest pain, abdominal pain, fevers.  The history is provided by the patient, medical records and a relative.  Shortness of Breath Associated symptoms: no abdominal pain, no chest pain, no fever, no headaches, no sore throat and no vomiting        Past Medical History:  Diagnosis Date  . Anxiety   . Arthritis    neck and lower back - deg disc disease, fingers  . Depression   . Frequency of urination   . GERD (gastroesophageal reflux disease)   . History of seizure per pt no seizure since    11-07-2015  in setting of stress and sleep deprivation  /  negative work-up by neurologist unknown idiology  . Hypothyroidism   . Injury of thumb, left    03-31-2016    . Pelvic relaxation   . SUI (stress urinary incontinence, female)     Patient Active Problem List   Diagnosis Date Noted  . Aspiration pneumonia ([place]) 02/20/2020  . Lactic acidosis 02/20/2020   . Acute metabolic encephalopathy [id number]  . Sepsis ([place]) 02/20/2020  . Chronic pain of both knees 01/08/2020  . Encephalopathy acute 10/27/2019  . Acute encephalopathy 10/27/2019  . CAP (community acquired pneumonia) 07/31/2019  . Atrial fibrillation with RVR ([place]) 07/31/2019  . Morbid obesity with BMI of 50.0-59.9, adult ([place]) 07/31/2019  . Respiratory failure ([place]) 04/14/2019  . Community acquired pneumonia 04/14/2019  . Closed nondisplaced fracture of head of left radius 06/20/2018  . Sprain of left wrist 06/20/2018  . Peroneal tendinitis, right leg 06/23/2017  . Trochanteric bursitis, right hip 06/23/2017  . Acute right-sided low back pain with left-sided sciatica 03/24/2017  . Pain in right hip 03/24/2017  . Impingement syndrome of left shoulder 08/30/2016  . Impingement syndrome of right shoulder 08/30/2016  . Plantar fasciitis, bilateral 08/30/2016  . Pelvic relaxation 04/13/2016    Class: Present on Admission  . S/P hysterectomy 04/12/2016  . Depression 11/16/2015  . Generalized anxiety disorder 11/16/2015  . Convulsion ([place]) 11/16/2015  . SUI (stress urinary incontinence, female) 09/26/2012    Past Surgical History:  Procedure Laterality Date  . ANTERIOR AND POSTERIOR REPAIR  09/25/2012   Procedure: ANTERIOR (CYSTOCELE) AND POSTERIOR REPAIR (RECTOCELE);  Surgeon: Lahoma Crocker, MD;  Location: [facility];  Service: Gynecology;  Laterality: N/A;  . ANTERIOR AND POSTERIOR REPAIR WITH SACROSPINOUS FIXATION N/A 04/12/2016   Procedure:  ANTERIOR AND POSTERIOR REPAIR WITH SACROSPINOUS LIGAMENT SUSPENSION, CYSTOSCOPY;  Surgeon: Arvella Nigh, MD;  Location: [facility];  Service: Gynecology;  Laterality: N/A;  . APPENDECTOMY  1987  . BREAST EXCISIONAL BIOPSY Left    No scar visable   . BREAST REDUCTION SURGERY  2000  aprrox  . ENDOMETRIAL ABLATION W/ NOVASURE  2011   in office  . EXCISION LEFT BREAST BX  12-07-1999   benign  . LAPAROSCOPIC CHOLECYSTECTOMY   02-09-2005  . LAPAROSCOPIC VAGINAL HYSTERECTOMY WITH SALPINGO OOPHORECTOMY Bilateral 04/12/2016   Procedure: LAPAROSCOPIC ASSISTED VAGINAL HYSTERECTOMY WITH SALPINGO OOPHORECTOMY;  Surgeon: Arvella Nigh, MD;  Location: [facility];  Service: Gynecology;  Laterality: Bilateral;  . REDUCTION MAMMAPLASTY Bilateral   . ROUX-EN-Y GASTRIC BYPASS  06/  2002  . TUBAL LIGATION  10/ 1990     OB History   No obstetric history on file.     Family History  Problem Relation Age of Onset  . Colon polyps Mother   . Heart disease Father 58       CABG  . Breast cancer Maternal Aunt   . Stomach cancer Maternal Grandmother   . Diabetes Maternal Grandmother   . Heart disease Maternal Grandmother   . Stomach cancer Maternal Uncle   . Colon cancer Maternal Uncle        x3  . Diabetes Maternal Aunt        Type I  . Diabetes Maternal Uncle        x5 - Type II  . Heart disease Paternal Grandmother   . Esophageal cancer Neg Hx   . Gallbladder disease Neg Hx   . Seizures Neg Hx     Social History   Tobacco Use  . Smoking status: Never Smoker  . Smokeless tobacco: Never Used  Substance Use Topics  . Alcohol use: No    Comment: drinks daily - none since 08/2012 per pt.  . Drug use: No    Home Medications Prior to Admission medications   Medication Sig Start Date End Date Taking? Authorizing Provider  apixaban (ELIQUIS) 5 MG TABS tablet Take 5 mg by mouth 2 (two) times daily.   Yes [provider]  buPROPion (WELLBUTRIN XL) 150 MG 24 hr tablet Take 150 mg by mouth in the morning and at bedtime.  12/17/19  Yes [provider]  clonazePAM (KLONOPIN) 0.5 MG tablet Take 0.5 tablets (0.25 mg total) by mouth 3 (three) times daily as needed for anxiety. 10/28/19  Yes Bonnell Public, MD  Diclofenac Sodium (PENNSAID) 2 % SOLN Apply 2 g topically 2 (two) times daily as needed (to affected area). 01/08/20  Yes Leandrew Koyanagi, MD  FLUoxetine (PROZAC) 20 MG capsule Take 20 mg  by mouth at bedtime.  07/26/18  Yes [provider]  furosemide (LASIX) 20 MG tablet TAKE ONE TABLET BY MOUTH TWICE A DAY Patient taking differently: Take 20 mg by mouth 2 (two) times daily.  02/11/20  Yes Sherran Needs, NP  gabapentin (NEURONTIN) 600 MG tablet Take 600 mg by mouth 3 (three) times daily.   Yes [provider]  metoprolol succinate (TOPROL-XL) 25 MG 24 hr tablet TAKE ONE TABLET BY MOUTH AT BEDTIME Patient taking differently: Take 25 mg by mouth at bedtime.  02/05/20  Yes Sherran Needs, NP  OLANZapine (ZYPREXA) 10 MG tablet Take 10 mg by mouth at bedtime. 04/04/19  Yes [provider]  potassium chloride SA (KLOR-CON M20) 20 MEQ  tablet Take 1 tablet (20 mEq total) by mouth daily. 11/16/19  Yes Sherran Needs, NP  amoxicillin-clavulanate (AUGMENTIN) 875-125 MG tablet Take 1 tablet by mouth 2 (two) times daily for 7 days. 02/22/20 02/29/20  [name], PA-C  apixaban (ELIQUIS) 5 MG TABS tablet Take 1 tablet (5 mg total) by mouth 2 (two) times daily. 09/03/19 02/20/20  Lendon Colonel, NP  diltiazem (CARDIZEM CD) 300 MG 24 hr capsule Take 1 capsule (300 mg total) by mouth daily. Patient not taking: Reported on 01/22/2020 09/04/19 01/22/20  Minus Breeding, MD  gabapentin (NEURONTIN) 300 MG capsule Take 1 capsule (300 mg total) by mouth 2 (two) times daily. Patient not taking: Reported on 02/20/2020 10/28/19   Dana Allan I, MD    Allergies    Nsaids, Adhesive [tape], Latex, and Sulfa antibiotics  Review of Systems   Review of Systems  Constitutional: Positive for fatigue. Negative for chills and fever.  HENT: Negative for sore throat.   Respiratory: Positive for shortness of breath.   Cardiovascular: Negative for chest pain and leg swelling.  Gastrointestinal: Negative for abdominal pain, diarrhea, nausea and vomiting.  Genitourinary: Negative for flank pain.  Musculoskeletal: Negative for back pain.  Skin: Negative for pallor and wound.   Neurological: Negative for light-headedness and headaches.  All other systems reviewed and are negative.   Physical Exam Updated Vital Signs BP (!) 153/72 (BP Location: Left Arm) Comment (BP Location): left forearm  Pulse 83   Temp 98.2 F (36.8 C) (Oral)   Resp 14   LMP 09/30/2012   SpO2 100%   Physical Exam Vitals and nursing note reviewed.  Constitutional:      Appearance: She is well-developed. She is not ill-appearing or toxic-appearing.     Comments: Falls in and out of sleep during interview.  HENT:     Head: Normocephalic and atraumatic.     Mouth/Throat:     Mouth: Mucous membranes are dry.     Pharynx: Oropharynx is clear.     Tonsils: No tonsillar exudate or tonsillar abscesses. 1+ on the right. 1+ on the left.     Comments: Oropharynx is dry throughout.  Eyes:     Pupils: Pupils are equal, round, and reactive to light.  Cardiovascular:     Rate and Rhythm: Normal rate.  Pulmonary:     Effort: Pulmonary effort is normal. No tachypnea.     Breath sounds: No stridor. Decreased breath sounds and rales present. No wheezing or rhonchi.  Chest:     Chest wall: No tenderness.  Abdominal:     Palpations: Abdomen is soft.     Tenderness: There is no abdominal tenderness.  Musculoskeletal:     Right lower leg: No tenderness. No edema.     Left lower leg: No tenderness. No edema.  Skin:    General: Skin is warm and dry.  Neurological:     Mental Status: She is alert.     ED Results / Procedures / Treatments   Labs (all labs ordered are listed, but only abnormal results are displayed) Labs Reviewed  CBC WITH DIFFERENTIAL/PLATELET - Abnormal; Notable for the following components:      Result Value   Hemoglobin 8.9 (*)    HCT 30.7 (*)    MCV 78.1 (*)    MCH 22.6 (*)    MCHC 29.0 (*)    RDW 17.1 (*)    All other components within normal limits  COMPREHENSIVE METABOLIC PANEL - Abnormal; Notable for  the following components:   Calcium 8.5 (*)    All other  components within normal limits  RAPID URINE DRUG SCREEN, HOSP PERFORMED - Abnormal; Notable for the following components:   Opiates POSITIVE (*)    Benzodiazepines POSITIVE (*)    All other components within normal limits  BLOOD GAS, VENOUS - Abnormal; Notable for the following components:   pO2, Ven <31.0 (*)    Bicarbonate 28.1 (*)    All other components within normal limits  LACTIC ACID, PLASMA  PROTIME-INR    EKG EKG Interpretation  Date/Time:  Friday Feb 22 2020 15:58:04 EDT Ventricular Rate:  72 PR Interval:    QRS Duration: 101 QT Interval:  402 QTC Calculation: 440 R Axis:   41 Text Interpretation: Sinus rhythm Borderline T abnormalities, anterior leads No significant change since last tracing Confirmed by Lacretia Leigh ([id number]) on 02/22/2020 4:16:11 PM   Radiology CT Angio Chest PE W and/or Wo Contrast  Result Date: 02/22/2020 CLINICAL DATA:  Shortness of breath. Possible pneumonia. Recent hospitalization. EXAM: CT ANGIOGRAPHY CHEST WITH CONTRAST TECHNIQUE: Multidetector CT imaging of the chest was performed using the standard protocol during bolus administration of intravenous contrast. Multiplanar CT image reconstructions and MIPs were obtained to evaluate the vascular anatomy. CONTRAST:  149mL OMNIPAQUE IOHEXOL 350 MG/ML SOLNx 2 for total of 200 cc. Initial contrast bolus was suboptimal for pulmonary artery evaluation. COMPARISON:  Radiograph earlier this day. Most recent chest CTA 04/14/2019. Lung bases from abdominal CT 02/20/2020 FINDINGS: Cardiovascular: There are no filling defects within the pulmonary arteries to suggest pulmonary embolus. Breathing motion artifact partially obscures assessment. Repeat bolus is better diagnostic quality than initial exam. There is prominence of the central pulmonary arteries, main pulmonary artery diameter 3.5 cm. 6 The thoracic aorta is normal in caliber. No aneurysm or dissection. Mild cardiomegaly. No pericardial effusion.  Mediastinum/Nodes: No enlarged mediastinal or hilar lymph nodes, few prominent bilateral hilar lymph nodes are not enlarged by size criteria. No esophageal wall thickening. No thyroid nodule. Lungs/Pleura: Breathing motion artifact partially obscures detailed assessment. Improved bibasilar aeration from prior exam with improving patchy bibasilar opacities. There is no new or confluent airspace disease. Slight heterogeneous pulmonary parenchyma suggesting underlying small airways disease. No pleural effusion. The trachea and mainstem bronchi are patent. No convincing pulmonary edema. Upper Abdomen: Gastric bypass anatomy. No acute findings. Musculoskeletal: There are no acute or suspicious osseous abnormalities. Review of the MIP images confirms the above findings. IMPRESSION: 1. No pulmonary embolus. Breathing motion artifact partially obscures assessment. 2. Improved bibasilar aeration from recent abdominal CT with improving patchy bibasilar opacities. 3. Slight heterogeneous pulmonary parenchyma suggesting underlying small airways disease. 4. Mild cardiomegaly. Dilatation of the central pulmonary arteries, can be seen with pulmonary arterial hypertension. Electronically Signed   By: Keith Rake M.D.   On: 02/22/2020 18:45   DG Chest Portable 1 View  Result Date: 02/22/2020 CLINICAL DATA:  Shortness of breath. EXAM: PORTABLE CHEST 1 VIEW COMPARISON:  Feb 20, 2020 FINDINGS: The lungs are hyperinflated. Mild diffuse chronic appearing increased lung markings are seen. There is no evidence of acute infiltrate, pleural effusion or pneumothorax. The heart size and mediastinal contours are within normal limits. The visualized skeletal structures are unremarkable. IMPRESSION: No active disease. Electronically Signed   By: Virgina Norfolk M.D.   On: 02/22/2020 15:48    Procedures Procedures (including critical care time)  Medications Ordered in ED Medications  sodium chloride (PF) 0.9 % injection (has no  administration in time range)  iohexol (OMNIPAQUE) 350 MG/ML injection 100 mL (100 mLs Intravenous Contrast Given 02/22/20 1814)    ED Course  I have reviewed the triage vital signs and the nursing notes.  Pertinent labs & imaging results that were available during my care of the patient were reviewed by me and considered in my medical decision making (see chart for details).  Clinical Course as of Feb 22 2044  Fri Feb 22, 2020  1650 pO2, Ven(!!): <31.0 [JS]  1850 Opiates(!): POSITIVE [JS]  1850 Benzodiazepines(!): POSITIVE [JS]    Clinical Course User Index [JS] [name], PA-C   MDM Rules/Calculators/A&P   Patient with a past medical history of A. fib, Neri edema, hypothyroidism presents to the ED today with worsening shortness of breath.  Recently admitted to the hospital on Feb 19, 2020 for sepsis with encephalopathy along with aspiration pneumonia, patient left AMA after 1 day of treatment.  According to daughter who has been taking care of patient at home patient had been worsening.  Did arrive today with oxygen saturations 94%, she is difficult to arouse, is axo x2.  Has follow-up appointment today with PCP who recommended patient be seen in the ED again.  According to his note which have extensively reviewing her file, she was treated with Rocephin and Flagyl.  Has only had treatment for 2 days.  According to his note and according to patient, she thought that this could be treated in an outpatient basis however family members including husband and daughter report her condition has been worsening at home.  Daughter reports recording 2 episodes of hypotension while at home yesterday with systolic pressure in the Q000111Q.  Interpretation of her labs by me revealed a CBC without any leukocytosis, her hemoglobin slightly decreased at 8.9, decreased from her previous visit 2 days ago.  CMP without any electrolyte abnormality.  Creatinine level is within normal limits.  LFTs are  unremarkable.  PT and INR at baseline.  Her lactic acid is negative.  This was obtained with less than 31 for her O2, patient does fall asleep in the middle of our conversation, some suspicion of polypharmacy which was noted on her previous visit.  Her UDS was positive for opiates along with benzos.  Chest x-ray did not show any consolidation, pathology noted.  Due to patient's recent O2 along with prior diagnosis of aspiration pneumonia will obtain a CT angio to further evaluate and rule out any acute pathology.  She is currently on blood thinner therapy and reports compliance with this.  CT angio showed: 1. No pulmonary embolus. Breathing motion artifact partially  obscures assessment.  2. Improved bibasilar aeration from recent abdominal CT with  improving patchy bibasilar opacities.  3. Slight heterogeneous pulmonary parenchyma suggesting underlying  small airways disease.  4. Mild cardiomegaly. Dilatation of the central pulmonary arteries,  can be seen with pulmonary arterial hypertension.    Patient was ambulated by nursing staff, her oxygen saturation did not drop below 93%, however patient wa admitted 2 days ago for aspiration pneumonia.   7:54 PM spoke to Dr. Myna Hidalgo hospitalist, appreciate his help.  Patient could be treated in an outpatient basis with a course of antibiotics.  He has agreed to see the patient in the ED prior to discharge.  8:42 PM patient and patient's daughters were addressed by me, Dr. Myna Hidalgo had spoken to them about 2 sets of antibiotics, I personally consulted him and we feel that is appropriate to send patient with Augmentin and consider mono therapy  at this time with 1 tablet twice daily for the next 7 days.Patient's vitals are within normal limits, patient is stable for discharge.    Portions of this note were generated with [employer]. Dictation errors may occur despite best attempts at proofreading.  Final Clinical Impression(s) / ED  Diagnoses Final diagnoses:  Aspiration pneumonia of both lungs, unspecified aspiration pneumonia type, unspecified part of lung ([place])    Rx / DC Orders ED Discharge Orders         Ordered    amoxicillin-clavulanate (AUGMENTIN) 875-125 MG tablet  2 times daily     02/22/20 2042           [name], Hershal Coria 02/22/20 2045    Lacretia Leigh, MD 02/25/20 1108

## 2020-02-22 NOTE — Discharge Summary (Signed)
Physician Discharge Summary  Tammy [name] [id number] DOB: 1961/04/10 DOA: 02/19/2020  PCP: Fanny Bien, MD  Admit date: 02/19/2020 Discharge date: 02/22/2020  Admitted From: Home Disposition:  Home  Brief/Interim Summary: Patient signed AMA on 02/19/2020.  Please see my progress note on that day for further details.   Discharge Diagnoses:  Active Problems:   Depression   Respiratory failure (HCC)   Atrial fibrillation with RVR (HCC)   Morbid obesity with BMI of 50.0-59.9, adult (HCC)   Aspiration pneumonia (HCC)   Lactic acidosis   Acute metabolic encephalopathy   Sepsis ([place])    Discharge Instructions   Allergies as of 02/20/2020      Reactions   Nsaids Other (See Comments)   Post Gastric Bypass   Adhesive [tape] Itching, Rash   Latex Itching, Rash   Sulfa Antibiotics Itching, Rash      Medication List    ASK your doctor about these medications   apixaban 5 MG Tabs tablet Commonly known as: ELIQUIS Take 1 tablet (5 mg total) by mouth 2 (two) times daily.   buPROPion 300 MG 24 hr tablet Commonly known as: WELLBUTRIN XL Take 300 mg by mouth daily.   buPROPion 150 MG 24 hr tablet Commonly known as: WELLBUTRIN XL Take 450 mg by mouth daily.   clonazePAM 0.5 MG tablet Commonly known as: KLONOPIN Take 0.5 tablets (0.25 mg total) by mouth 3 (three) times daily as needed for anxiety.   diltiazem 300 MG 24 hr capsule Commonly known as: CARDIZEM CD Take 1 capsule (300 mg total) by mouth daily.   FLUoxetine 20 MG capsule Commonly known as: PROZAC Take 20 mg by mouth at bedtime.   furosemide 20 MG tablet Commonly known as: LASIX TAKE ONE TABLET BY MOUTH TWICE A DAY   gabapentin 600 MG tablet Commonly known as: NEURONTIN Take 600 mg by mouth 3 (three) times daily.   gabapentin 300 MG capsule Commonly known as: NEURONTIN Take 1 capsule (300 mg total) by mouth 2 (two) times daily.   metoprolol succinate 25 MG 24 hr tablet Commonly known as:  TOPROL-XL TAKE ONE TABLET BY MOUTH AT BEDTIME   OLANZapine 10 MG tablet Commonly known as: ZYPREXA Take 10 mg by mouth at bedtime.   potassium chloride SA 20 MEQ tablet Commonly known as: Klor-Con M20 Take 1 tablet (20 mEq total) by mouth daily.   Voltaren 1 % Gel Generic drug: diclofenac Sodium Apply 2 g topically daily as needed (pain).   Pennsaid 2 % Soln Generic drug: Diclofenac Sodium Apply 2 g topically 2 (two) times daily as needed (to affected area).       Allergies  Allergen Reactions  . Nsaids Other (See Comments)    Post Gastric Bypass  . Adhesive [Tape] Itching and Rash  . Latex Itching and Rash  . Sulfa Antibiotics Itching and Rash    Consultations:     Procedures/Studies: CT Head Wo Contrast  Result Date: 02/20/2020 CLINICAL DATA:  Altered mental status EXAM: CT HEAD WITHOUT CONTRAST TECHNIQUE: Contiguous axial images were obtained from the base of the skull through the vertex without intravenous contrast. COMPARISON:  CT brain 10/27/2019 FINDINGS: Brain: No acute territorial infarction, hemorrhage, or intracranial mass. Stable ventricle size with slight asymmetric prominence of the right lateral ventricle. Vascular: No hyperdense vessels.  No unexpected calcification Skull: Normal. Negative for fracture or focal lesion. Sinuses/Orbits: No acute finding. Other: None IMPRESSION: Negative.  No CT evidence for acute intracranial abnormality Electronically Signed  By: Donavan Foil M.D.   On: 02/20/2020 01:24   DG Chest Port 1 View  Result Date: 02/20/2020 CLINICAL DATA:  Sepsis EXAM: PORTABLE CHEST 1 VIEW COMPARISON:  11/07/2019 FINDINGS: Cardiomegaly with vascular congestion and hazy bilateral opacities suspicious for edema. No pleural effusion or pneumothorax. Aortic atherosclerosis IMPRESSION: Cardiomegaly with vascular congestion and hazy pulmonary edema Electronically Signed   By: Donavan Foil M.D.   On: 02/20/2020 01:38   ECHOCARDIOGRAM  COMPLETE  Result Date: 02/20/2020    ECHOCARDIOGRAM REPORT   Patient Name:   Tammy [name] [facility] Date of Exam: 02/20/2020 Medical Rec #:  [id number]      Height:       66.0 in Accession #:    [id number]     Weight:       309.1 lb Date of Birth:  1961-07-07      BSA:          2.406 m Patient Age:    59 years       BP:           113/61 mmHg Patient Gender: F              HR:           75 bpm. Exam Location:  Inpatient Procedure: 2D Echo, Cardiac Doppler and Color Doppler Indications:    I50.30* Unspecified diastolic (congestive) heart failure  History:        Patient has prior history of Echocardiogram examinations, most                 recent 07/31/2019. Abnormal ECG; Signs/Symptoms:Bacteremia.  Sonographer:    [place] Referring Phys: [id number] AMRIT ADHIKARI  Sonographer Comments: Technically difficult study due to poor echo windows and patient is morbidly obese. Image acquisition challenging due to patient body habitus. IMPRESSIONS  1. Left ventricular ejection fraction, by estimation, is 60 to 65%. The left ventricle has normal function. The left ventricle has no regional wall motion abnormalities. There is mild left ventricular hypertrophy. Left ventricular diastolic parameters were normal.  2. Right ventricular systolic function is normal. The right ventricular size is normal. There is severely elevated pulmonary artery systolic pressure.  3. The mitral valve is normal in structure. Trivial mitral valve regurgitation. No evidence of mitral stenosis.  4. The aortic valve is normal in structure. Aortic valve regurgitation is not visualized. No aortic stenosis is present.  5. The inferior vena cava is normal in size with greater than 50% respiratory variability, suggesting right atrial pressure of 3 mmHg. FINDINGS  Left Ventricle: Left ventricular ejection fraction, by estimation, is 60 to 65%. The left ventricle has normal function. The left ventricle has no regional wall motion abnormalities. The left ventricular  internal cavity size was normal in size. There is  mild left ventricular hypertrophy. Left ventricular diastolic parameters were normal. Right Ventricle: The right ventricular size is normal. No increase in right ventricular wall thickness. Right ventricular systolic function is normal. There is severely elevated pulmonary artery systolic pressure. The tricuspid regurgitant velocity is 3.67 m/s, and with an assumed right atrial pressure of 8 mmHg, the estimated right ventricular systolic pressure is [id number] mmHg. Left Atrium: Left atrial size was normal in size. Right Atrium: Right atrial size was normal in size. Pericardium: There is no evidence of pericardial effusion. Mitral Valve: The mitral valve is normal in structure. There is mild calcification of the mitral valve leaflet(s). Normal mobility of the mitral valve leaflets. Mild mitral annular calcification.  Trivial mitral valve regurgitation. No evidence of mitral valve stenosis. Tricuspid Valve: The tricuspid valve is normal in structure. Tricuspid valve regurgitation is mild . No evidence of tricuspid stenosis. Aortic Valve: The aortic valve is normal in structure. Aortic valve regurgitation is not visualized. No aortic stenosis is present. Pulmonic Valve: The pulmonic valve was normal in structure. Pulmonic valve regurgitation is not visualized. No evidence of pulmonic stenosis. Aorta: The aortic root is normal in size and structure. Venous: The inferior vena cava is normal in size with greater than 50% respiratory variability, suggesting right atrial pressure of 3 mmHg. IAS/Shunts: No atrial level shunt detected by color flow Doppler.  LEFT VENTRICLE PLAX 2D LVIDd:         5.02 cm      Diastology LVIDs:         2.88 cm      LV e' lateral:   13.40 cm/s LV PW:         0.96 cm      LV E/e' lateral: 7.1 LV IVS:        1.31 cm      LV e' medial:    9.57 cm/s LVOT diam:     1.90 cm      LV E/e' medial:  10.0 LV SV:         74 LV SV Index:   31 LVOT Area:     2.84  cm  LV Volumes (MOD) LV vol d, MOD A2C: 118.0 ml LV vol d, MOD A4C: 82.9 ml LV vol s, MOD A2C: 38.5 ml LV vol s, MOD A4C: 33.9 ml LV SV MOD A2C:     79.5 ml LV SV MOD A4C:     82.9 ml LV SV MOD BP:      61.8 ml RIGHT VENTRICLE             IVC RV S prime:     14.40 cm/s  IVC diam: 2.60 cm TAPSE (M-mode): 2.8 cm LEFT ATRIUM             Index       RIGHT ATRIUM           Index LA diam:        3.70 cm 1.54 cm/m  RA Area:     15.10 cm LA Vol (A2C):   34.8 ml 14.46 ml/m RA Volume:   45.10 ml  18.74 ml/m LA Vol (A4C):   71.5 ml 29.71 ml/m LA Biplane Vol: 51.1 ml 21.24 ml/m  AORTIC VALVE LVOT Vmax:   139.00 cm/s LVOT Vmean:  98.600 cm/s LVOT VTI:    0.262 m  AORTA Ao Root diam: 3.30 cm Ao Asc diam:  3.50 cm MITRAL VALVE               TRICUSPID VALVE MV Area (PHT): 3.23 cm    TR Peak grad:   53.9 mmHg MV Decel Time: 235 msec    TR Vmax:        367.00 cm/s MV E velocity: 95.35 cm/s MV A velocity: 55.18 cm/s  SHUNTS MV E/A ratio:  1.73        Systemic VTI:  0.26 m                            Systemic Diam: 1.90 cm Jenkins Rouge MD Electronically signed by Jenkins Rouge MD Signature Date/Time: 02/20/2020/9:42:07 AM    Final    CT Renal  Stone Study  Result Date: 02/20/2020 CLINICAL DATA:  Flank pain back pain EXAM: CT ABDOMEN AND PELVIS WITHOUT CONTRAST TECHNIQUE: Multidetector CT imaging of the abdomen and pelvis was performed following the standard protocol without IV contrast. COMPARISON:  CT 04/14/2019 FINDINGS: Lower chest: Lung bases demonstrate patchy consolidations and ground-glass densities within the bilateral lower lobes with slight nodularity. No pleural effusion. Borderline cardiomegaly. Hepatobiliary: No focal liver abnormality is seen. Status post cholecystectomy. No biliary dilatation. Pancreas: Unremarkable. No pancreatic ductal dilatation or surrounding inflammatory changes. Spleen: Normal in size without focal abnormality. Adrenals/Urinary Tract: Adrenal glands are unremarkable. Kidneys are normal,  without renal calculi, focal lesion, or hydronephrosis. Bladder is unremarkable. Stomach/Bowel: Status post gastric bypass. Fluid-filled slightly dilated jejunal loops with gradual transition to non dilated mid and distal small bowel. Mild gaseous dilatation of the colon as well. No bowel wall thickening. Status post appendectomy. Vascular/Lymphatic: No significant vascular findings are present. No enlarged abdominal or pelvic lymph nodes. Reproductive: Status post hysterectomy. No adnexal masses. Other: Negative for free air or free fluid. Musculoskeletal: Degenerative changes at L4-L5 and L5-S1. No acute or suspicious osseous abnormality. IMPRESSION: 1. Patchy bilateral lung base consolidations which may reflect bilateral pneumonia or possible aspiration. 2. Scattered fluid-filled slightly dilated jejunal loops with air distension of colon, suggestive of ileus or enteritis. No convincing evidence for obstruction Electronically Signed   By: Donavan Foil M.D.   On: 02/20/2020 01:31       Subjective:   Discharge Exam: Vitals:   02/20/20 1525 02/20/20 1600  BP: (!) 107/58   Pulse: 83   Resp: 14   Temp:  98.4 F (36.9 C)  SpO2: 97%    Vitals:   02/20/20 1400 02/20/20 1500 02/20/20 1525 02/20/20 1600  BP:   (!) 107/58   Pulse: 81 97 83   Resp: 18 20 14    Temp:    98.4 F (36.9 C)  TempSrc:    Oral  SpO2: 97% 98% 97%   Weight:      Height:           The results of significant diagnostics from this hospitalization (including imaging, microbiology, ancillary and laboratory) are listed below for reference.     Microbiology: Recent Results (from the past 240 hour(s))  Blood Culture (routine x 2)     Status: None (Preliminary result)   Collection Time: 02/20/20 12:08 AM   Specimen: BLOOD  Result Value Ref Range Status   Specimen Description   Final    BLOOD BLOOD LEFT FOREARM Performed at [facility] [address] [place] [address] [place], [place] [address]    Special  Requests   Final    BOTTLES DRAWN AEROBIC AND ANAEROBIC Blood Culture adequate volume Performed at [facility] [address], [place], [place] [address]    Culture   Final    NO GROWTH 2 DAYS Performed at [facility] [address] [place], [place] [address]    Report Status PENDING  Incomplete  Blood Culture (routine x 2)     Status: None (Preliminary result)   Collection Time: 02/20/20 12:13 AM   Specimen: BLOOD  Result Value Ref Range Status   Specimen Description   Final    BLOOD BLOOD RIGHT FOREARM Performed at [facility] [address], [place], [place] [address]    Special Requests   Final    BOTTLES DRAWN AEROBIC AND ANAEROBIC Blood Culture adequate volume Performed at [facility],  [place] [address] [place], [place] [address]    Culture   Final    NO GROWTH 2 DAYS Performed at [facility] [address], [place], [place] [address]    Report Status PENDING  Incomplete  Urine culture     Status: None   Collection Time: 02/20/20  4:00 AM   Specimen: In/Out Cath Urine  Result Value Ref Range Status   Specimen Description   Final    IN/OUT CATH URINE Performed at [facility] [address], [place], [place] [address]    Special Requests   Final    NONE Performed at [facility], [place] [address], [place], [place] [address]    Culture   Final    NO GROWTH Performed at [facility], [place] [address], [place], [place] [address]    Report Status 02/21/2020 FINAL  Final  SARS Coronavirus 2 by RT PCR (hospital order, performed in [facility] hospital lab) Nasopharyngeal Nasopharyngeal Swab     Status: None   Collection Time: 02/20/20  4:44 AM   Specimen: Nasopharyngeal Swab  Result Value Ref Range Status   SARS Coronavirus 2 NEGATIVE NEGATIVE Final    Comment: (NOTE) SARS-CoV-2 target nucleic acids are NOT DETECTED. The SARS-CoV-2 RNA is generally  detectable in upper and lower respiratory specimens during the acute phase of infection. The lowest concentration of SARS-CoV-2 viral copies this assay can detect is 250 copies / mL. A negative result does not preclude SARS-CoV-2 infection and should not be used as the sole basis for treatment or other patient management decisions.  A negative result may occur with improper specimen collection / handling, submission of specimen other than nasopharyngeal swab, presence of viral mutation(s) within the areas targeted by this assay, and inadequate number of viral copies (<250 copies / mL). A negative result must be combined with clinical observations, patient history, and epidemiological information. Fact Sheet for Patients:   StrictlyIdeas.no Fact Sheet for Healthcare Providers: BankingDealers.co.za This test is not yet approved or cleared  by the [place] FDA and has been authorized for detection and/or diagnosis of SARS-CoV-2 by FDA under an Emergency Use Authorization (EUA).  This EUA will remain in effect (meaning this test can be used) for the duration of the COVID-19 declaration under Section 564(b)(1) of the Act, 21 U.S.C. section 360bbb-3(b)(1), unless the authorization is terminated or revoked sooner. Performed at [facility], [place] [address] [place], [place] [address]   MRSA PCR Screening     Status: None   Collection Time: 02/20/20  5:42 AM   Specimen: Nasal Mucosa; Nasopharyngeal  Result Value Ref Range Status   MRSA by PCR NEGATIVE NEGATIVE Final    Comment:        The GeneXpert MRSA Assay (FDA approved for NASAL specimens only), is one component of a comprehensive MRSA colonization surveillance program. It is not intended to diagnose MRSA infection nor to guide or monitor treatment for MRSA infections. Performed at [facility], [place] [address] [place], [place] [address]       Labs: BNP (last 3 results) Recent Labs    10/26/19 2128 02/20/20 0324  BNP 89.0 [id number]   Basic Metabolic Panel: Recent Labs  Lab 02/20/20 0008  NA 135  K 3.9  CL 104  CO2 23  GLUCOSE 104*  BUN 14  CREATININE 0.92  CALCIUM 7.5*   Liver Function Tests: Recent Labs  Lab 02/20/20 0008  AST  17  ALT 15  ALKPHOS 104  BILITOT 0.4  PROT 6.7  ALBUMIN 3.4*   No results for input(s): LIPASE, AMYLASE in the last 168 hours. Recent Labs  Lab 02/20/20 0153  AMMONIA 9   CBC: Recent Labs  Lab 02/20/20 0008  WBC 14.6*  NEUTROABS 12.8*  HGB 10.0*  HCT 34.7*  MCV 77.1*  PLT 370   Cardiac Enzymes: No results for input(s): CKTOTAL, CKMB, CKMBINDEX, TROPONINI in the last 168 hours. BNP: Invalid input(s): POCBNP CBG: No results for input(s): GLUCAP in the last 168 hours. D-Dimer No results for input(s): DDIMER in the last 72 hours. Hgb A1c No results for input(s): HGBA1C in the last 72 hours. Lipid Profile No results for input(s): CHOL, HDL, LDLCALC, TRIG, CHOLHDL, LDLDIRECT in the last 72 hours. Thyroid function studies Recent Labs    02/20/20 0153  TSH 2.012   Anemia work up Recent Labs    02/20/20 0153  FERRITIN 10*  TIBC 487*  IRON 17*   Urinalysis    Component Value Date/Time   COLORURINE YELLOW 02/20/2020 0400   APPEARANCEUR CLEAR 02/20/2020 0400   LABSPEC 1.009 02/20/2020 0400   PHURINE 5.0 02/20/2020 0400   GLUCOSEU NEGATIVE 02/20/2020 0400   HGBUR MODERATE (A) 02/20/2020 0400   BILIRUBINUR NEGATIVE 02/20/2020 0400   KETONESUR NEGATIVE 02/20/2020 0400   PROTEINUR NEGATIVE 02/20/2020 0400   UROBILINOGEN 1.0 03/13/2017 1637   NITRITE POSITIVE (A) 02/20/2020 0400   LEUKOCYTESUR NEGATIVE 02/20/2020 0400   Sepsis Labs Invalid input(s): PROCALCITONIN,  WBC,  LACTICIDVEN Microbiology Recent Results (from the past 240 hour(s))  Blood Culture (routine x 2)     Status: None (Preliminary result)   Collection Time: 02/20/20 12:08 AM   Specimen:  BLOOD  Result Value Ref Range Status   Specimen Description   Final    BLOOD BLOOD LEFT FOREARM Performed at [facility], [place] [address], [place], [place] [address]    Special Requests   Final    BOTTLES DRAWN AEROBIC AND ANAEROBIC Blood Culture adequate volume Performed at [facility] [address] [place], [place] [address]    Culture   Final    NO GROWTH 2 DAYS Performed at [facility] [address], [place], [place] [address]    Report Status PENDING  Incomplete  Blood Culture (routine x 2)     Status: None (Preliminary result)   Collection Time: 02/20/20 12:13 AM   Specimen: BLOOD  Result Value Ref Range Status   Specimen Description   Final    BLOOD BLOOD RIGHT FOREARM Performed at [facility] [address], [place], [place] [address]    Special Requests   Final    BOTTLES DRAWN AEROBIC AND ANAEROBIC Blood Culture adequate volume Performed at [facility] [address] [place], [place] [address]    Culture   Final    NO GROWTH 2 DAYS Performed at [facility] [address], [place], [place] [address]    Report Status PENDING  Incomplete  Urine culture     Status: None   Collection Time: 02/20/20  4:00 AM   Specimen: In/Out Cath Urine  Result Value Ref Range Status   Specimen Description   Final    IN/OUT CATH URINE Performed at [facility] [address], [place], [place] [address]    Special Requests   Final    NONE Performed at [facility], [address] [place], [place] [address]    Culture   Final    NO GROWTH Performed at [facility], [place] [address], [place], [place] [address]    Report Status 02/21/2020 FINAL  Final  SARS Coronavirus 2 by RT PCR (hospital order, performed in [facility] hospital lab) Nasopharyngeal Nasopharyngeal Swab     Status: None   Collection Time: 02/20/20  4:44 AM   Specimen:  Nasopharyngeal Swab  Result Value Ref Range Status   SARS Coronavirus 2 NEGATIVE NEGATIVE Final    Comment: (NOTE) SARS-CoV-2 target nucleic acids are NOT DETECTED. The SARS-CoV-2 RNA is generally detectable in upper and lower respiratory specimens during the acute phase of infection. The lowest concentration of SARS-CoV-2 viral copies this assay can detect is 250 copies / mL. A negative result does not preclude SARS-CoV-2 infection and should not be used as the sole basis for treatment or other patient management decisions.  A negative result may occur with improper specimen collection / handling, submission of specimen other than nasopharyngeal swab, presence of viral mutation(s) within the areas targeted by this assay, and inadequate number of viral copies (<250 copies / mL). A negative result must be combined with clinical observations, patient history, and epidemiological information. Fact Sheet for Patients:   StrictlyIdeas.no Fact Sheet for Healthcare Providers: BankingDealers.co.za This test is not yet approved or cleared  by the [place] FDA and has been authorized for detection and/or diagnosis of SARS-CoV-2 by FDA under an Emergency Use Authorization (EUA).  This EUA will remain in effect (meaning this test can be used) for the duration of the COVID-19 declaration under Section 564(b)(1) of the Act, 21 U.S.C. section 360bbb-3(b)(1), unless the authorization is terminated or revoked sooner. Performed at [facility], [place] [address], [place], [place] [address]   MRSA PCR Screening     Status: None   Collection Time: 02/20/20  5:42 AM   Specimen: Nasal Mucosa; Nasopharyngeal  Result Value Ref Range Status   MRSA by PCR NEGATIVE NEGATIVE Final    Comment:        The GeneXpert MRSA Assay (FDA approved for NASAL specimens only), is one component of a comprehensive MRSA colonization surveillance program.  It is not intended to diagnose MRSA infection nor to guide or monitor treatment for MRSA infections. Performed at [facility], [place] [address], [place], [place] [address]     Please note: You were cared for by a hospitalist during your hospital stay. Once you are discharged, your primary care physician will handle any further medical issues. Please note that NO REFILLS for any discharge medications will be authorized once you are discharged, as it is imperative that you return to your primary care physician (or establish a relationship with a primary care physician if you do not have one) for your post hospital discharge needs so that they can reassess your need for medications and monitor your lab values.    Time coordinating discharge: 40 minutes  SIGNED:   Shelly Coss, MD  [facility] 02/22/2020, 1:24 PM Pager [phone]  If 7PM-7AM, please contact night-coverage [email or web address] Password [email or web address]

## 2020-02-22 NOTE — Consult Note (Signed)
Medical Consultation   Tammy [name]  [id number]  DOB: June 02, 1961  DOA: 02/22/2020  PCP: [name], MD   Requesting physician: [name], PA   Reason for consultation: Suspected pneumonia    History of Present Illness: Tammy [name] is an 59 y.o. female with history of chronic pain, paroxysmal atrial fibrillation on Eliquis, BMI 50 and status post gastric bypass, now presenting to the emergency department at the direction of her PCP for suspected pneumonia.  Patient had been admitted to the hospital on 02/20/2020 with fever, leukocytosis, elevated lactic acid, new oxygen requirement, somnolence, and suspected aspiration pneumonitis/pneumonia.  She was treated with ceftriaxone and Flagyl initially, then switched to Unasyn.  She had an echocardiogram with normal EF and normal diastolic function, had unremarkable head CT and her somnolence was attributed to polypharmacy.  Patient elected to leave the hospital [facility].    She saw her PCP today for hospital follow-up and it was recommended that she return to the hospital to complete treatment of the suspected pneumonia.  The patient reports that her cough has resolved, she denies any further dyspnea, denies fevers or chills, and reports feeling well overall.  Family member at the bedside is concerned for the patient's somnolence that began several days prior to the recent admission and has improved. The patient attributes her somnolence to poor sleep at night but acknowledges that medications may be playing a role as well and is open to cutting back on these.  She denies any focal numbness or weakness and denies headache, change in vision, or change in hearing.  Patient takes 1200 mg of gabapentin at night and 600 mg once or twice during the day.  She also uses Klonopin 2-3 times a day and has been taking Percocet for chronic pain.  Patient is agreeable to cutting back on these medications gradually over the  next couple weeks and trying to limit gabapentin and Klonopin to bedtime only.   Review of Systems:  ROS As per HPI otherwise 10 point review of systems negative.   Past Medical History: Past Medical History:  Diagnosis Date  . Anxiety   . Arthritis    neck and lower back - deg disc disease, fingers  . Depression   . Frequency of urination   . GERD (gastroesophageal reflux disease)   . History of seizure per pt no seizure since    11-07-2015  in setting of stress and sleep deprivation  /  negative work-up by neurologist unknown idiology  . Hypothyroidism   . Injury of thumb, left    03-31-2016    . Pelvic relaxation   . SUI (stress urinary incontinence, female)     Past Surgical History: Past Surgical History:  Procedure Laterality Date  . ANTERIOR AND POSTERIOR REPAIR  09/25/2012   Procedure: ANTERIOR (CYSTOCELE) AND POSTERIOR REPAIR (RECTOCELE);  Surgeon: Lahoma Crocker, MD;  Location: [facility];  Service: Gynecology;  Laterality: N/A;  . ANTERIOR AND POSTERIOR REPAIR WITH SACROSPINOUS FIXATION N/A 04/12/2016   Procedure: ANTERIOR AND POSTERIOR REPAIR WITH SACROSPINOUS LIGAMENT SUSPENSION, CYSTOSCOPY;  Surgeon: Arvella Nigh, MD;  Location: [facility];  Service: Gynecology;  Laterality: N/A;  . APPENDECTOMY  1987  . BREAST EXCISIONAL BIOPSY Left    No scar visable   . BREAST REDUCTION SURGERY  2000  aprrox  . ENDOMETRIAL ABLATION W/ NOVASURE  2011   in office  .  EXCISION LEFT BREAST BX  12-07-1999   benign  . LAPAROSCOPIC CHOLECYSTECTOMY  02-09-2005  . LAPAROSCOPIC VAGINAL HYSTERECTOMY WITH SALPINGO OOPHORECTOMY Bilateral 04/12/2016   Procedure: LAPAROSCOPIC ASSISTED VAGINAL HYSTERECTOMY WITH SALPINGO OOPHORECTOMY;  Surgeon: Arvella Nigh, MD;  Location: [facility];  Service: Gynecology;  Laterality: Bilateral;  . REDUCTION MAMMAPLASTY Bilateral   . ROUX-EN-Y GASTRIC BYPASS  06/  2002  . TUBAL LIGATION  10/ 1990     Allergies:   Allergies   Allergen Reactions  . Nsaids Other (See Comments)    Post Gastric Bypass  . Adhesive [Tape] Itching and Rash  . Latex Itching and Rash  . Sulfa Antibiotics Itching and Rash     Social History:  reports that she has never smoked. She has never used smokeless tobacco. She reports that she does not drink alcohol or use drugs.   Family History: Family History  Problem Relation Age of Onset  . Colon polyps Mother   . Heart disease Father 58       CABG  . Breast cancer Maternal Aunt   . Stomach cancer Maternal Grandmother   . Diabetes Maternal Grandmother   . Heart disease Maternal Grandmother   . Stomach cancer Maternal Uncle   . Colon cancer Maternal Uncle        x3  . Diabetes Maternal Aunt        Type I  . Diabetes Maternal Uncle        x5 - Type II  . Heart disease Paternal Grandmother   . Esophageal cancer Neg Hx   . Gallbladder disease Neg Hx   . Seizures Neg Hx      Physical Exam: Vitals:   02/22/20 1730 02/22/20 1800 02/22/20 1830 02/22/20 1953  BP: 137/70 136/76 136/76 (!) 153/72  Pulse: 72 78 78 83  Resp: 14 13 16 14   Temp:      TempSrc:      SpO2: 97% 94% 94% 100%    Constitutional: Alert and awake, oriented x3, not in any acute distress. Eyes: PERLA, EOMI, irises appear normal, anicteric sclera,  ENMT: external ears and nose appear normal.  Lips appears normal, oropharynx mucosa, tongue, posterior pharynx appear normal  Neck: neck appears normal, no masses, normal ROM, no thyromegaly, no JVD  CVS: S1-S2 clear, no murmur rubs or gallops, no LE edema, normal pedal pulses  Respiratory:  clear to auscultation bilaterally, no wheezing, rales or rhonchi. Respiratory effort normal. No accessory muscle use.  Abdomen: soft nontender, nondistended, normal bowel sounds, no hepatosplenomegaly, no hernias  Musculoskeletal: : no cyanosis, clubbing or edema noted bilaterally Neuro: Cranial nerves II-XII intact, strength, sensation, reflexes Psych: judgement and  insight appear normal, stable mood and affect, mental status Skin: no rashes or lesions or ulcers, no induration or nodules   Data reviewed:  I have personally reviewed following labs and imaging studies Labs:  CBC: Recent Labs  Lab 02/20/20 0008 02/22/20 1523  WBC 14.6* 8.9  NEUTROABS 12.8* 5.8  HGB 10.0* 8.9*  HCT 34.7* 30.7*  MCV 77.1* 78.1*  PLT 370 [id number]    Basic Metabolic Panel: Recent Labs  Lab 02/20/20 0008 02/22/20 1523  NA 135 141  K 3.9 3.8  CL 104 103  CO2 23 30  GLUCOSE 104* 97  BUN 14 10  CREATININE 0.92 0.88  CALCIUM 7.5* 8.5*   GFR Estimated Creatinine Clearance: 103.3 mL/min (by C-G formula based on SCr of 0.88 mg/dL). Liver Function Tests: Recent Labs  Lab 02/20/20  0008 02/22/20 1523  AST 17 25  ALT 15 16  ALKPHOS 104 95  BILITOT 0.4 0.5  PROT 6.7 6.9  ALBUMIN 3.4* 3.6   No results for input(s): LIPASE, AMYLASE in the last 168 hours. Recent Labs  Lab 02/20/20 0153  AMMONIA 9   Coagulation profile Recent Labs  Lab 02/20/20 0008 02/22/20 1523  INR 1.2 1.1    Cardiac Enzymes: No results for input(s): CKTOTAL, CKMB, CKMBINDEX, TROPONINI in the last 168 hours. BNP: Invalid input(s): POCBNP CBG: No results for input(s): GLUCAP in the last 168 hours. D-Dimer No results for input(s): DDIMER in the last 72 hours. Hgb A1c No results for input(s): HGBA1C in the last 72 hours. Lipid Profile No results for input(s): CHOL, HDL, LDLCALC, TRIG, CHOLHDL, LDLDIRECT in the last 72 hours. Thyroid function studies Recent Labs    02/20/20 0153  TSH 2.012   Anemia work up Recent Labs    02/20/20 0153  FERRITIN 10*  TIBC 487*  IRON 17*   Urinalysis    Component Value Date/Time   COLORURINE YELLOW 02/20/2020 0400   APPEARANCEUR CLEAR 02/20/2020 0400   LABSPEC 1.009 02/20/2020 0400   PHURINE 5.0 02/20/2020 0400   GLUCOSEU NEGATIVE 02/20/2020 0400   HGBUR MODERATE (A) 02/20/2020 0400   BILIRUBINUR NEGATIVE 02/20/2020 0400    KETONESUR NEGATIVE 02/20/2020 0400   PROTEINUR NEGATIVE 02/20/2020 0400   UROBILINOGEN 1.0 03/13/2017 1637   NITRITE POSITIVE (A) 02/20/2020 0400   LEUKOCYTESUR NEGATIVE 02/20/2020 0400     Microbiology Recent Results (from the past 240 hour(s))  Blood Culture (routine x 2)     Status: None (Preliminary result)   Collection Time: 02/20/20 12:08 AM   Specimen: BLOOD  Result Value Ref Range Status   Specimen Description   Final    BLOOD BLOOD LEFT FOREARM Performed at [facility], [place] [address] [place], [place] [address]    Special Requests   Final    BOTTLES DRAWN AEROBIC AND ANAEROBIC Blood Culture adequate volume Performed at [facility] [address], [place], [place] [address]    Culture   Final    NO GROWTH 2 DAYS Performed at [facility] [address], [place], [place] [address]    Report Status PENDING  Incomplete  Blood Culture (routine x 2)     Status: None (Preliminary result)   Collection Time: 02/20/20 12:13 AM   Specimen: BLOOD  Result Value Ref Range Status   Specimen Description   Final    BLOOD BLOOD RIGHT FOREARM Performed at [facility] [address], [place], [place] [address]    Special Requests   Final    BOTTLES DRAWN AEROBIC AND ANAEROBIC Blood Culture adequate volume Performed at [facility] [address] [place], [place] [address]    Culture   Final    NO GROWTH 2 DAYS Performed at [facility] [address], [place], [place] [address]    Report Status PENDING  Incomplete  Urine culture     Status: None   Collection Time: 02/20/20  4:00 AM   Specimen: In/Out Cath Urine  Result Value Ref Range Status   Specimen Description   Final    IN/OUT CATH URINE Performed at [facility] [address], [place], [place] [address]    Special Requests   Final    NONE Performed at [facility], [place] [address] [place], [place]  [address]    Culture   Final    NO GROWTH Performed at [facility], [place] [address], [place], [place] [address]    Report Status 02/21/2020 FINAL  Final  SARS Coronavirus 2 by RT PCR (hospital order, performed in [facility] hospital lab) Nasopharyngeal Nasopharyngeal Swab     Status: None   Collection Time: 02/20/20  4:44 AM   Specimen: Nasopharyngeal Swab  Result Value Ref Range Status   SARS Coronavirus 2 NEGATIVE NEGATIVE Final    Comment: (NOTE) SARS-CoV-2 target nucleic acids are NOT DETECTED. The SARS-CoV-2 RNA is generally detectable in upper and lower respiratory specimens during the acute phase of infection. The lowest concentration of SARS-CoV-2 viral copies this assay can detect is 250 copies / mL. A negative result does not preclude SARS-CoV-2 infection and should not be used as the sole basis for treatment or other patient management decisions.  A negative result may occur with improper specimen collection / handling, submission of specimen other than nasopharyngeal swab, presence of viral mutation(s) within the areas targeted by this assay, and inadequate number of viral copies (<250 copies / mL). A negative result must be combined with clinical observations, patient history, and epidemiological information. Fact Sheet for Patients:   StrictlyIdeas.no Fact Sheet for Healthcare Providers: BankingDealers.co.za This test is not yet approved or cleared  by the [place] FDA and has been authorized for detection and/or diagnosis of SARS-CoV-2 by FDA under an Emergency Use Authorization (EUA).  This EUA will remain in effect (meaning this test can be used) for the duration of the COVID-19 declaration under Section 564(b)(1) of the Act, 21 U.S.C. section 360bbb-3(b)(1), unless the authorization is terminated or revoked sooner. Performed at [facility], [place] [address], [place], [place] [address]   MRSA PCR Screening     Status: None   Collection Time: 02/20/20  5:42 AM   Specimen: Nasal Mucosa; Nasopharyngeal  Result Value Ref Range Status   MRSA by PCR NEGATIVE NEGATIVE Final    Comment:        The GeneXpert MRSA Assay (FDA approved for NASAL specimens only), is one component of a comprehensive MRSA colonization surveillance program. It is not intended to diagnose MRSA infection nor to guide or monitor treatment for MRSA infections. Performed at [facility], [place] [address] [place],  [address]        Inpatient Medications:   Scheduled Meds: . sodium chloride (PF)       Continuous Infusions:   Radiological Exams on Admission: CT Angio Chest PE W and/or Wo Contrast  Result Date: 02/22/2020 CLINICAL DATA:  Shortness of breath. Possible pneumonia. Recent hospitalization. EXAM: CT ANGIOGRAPHY CHEST WITH CONTRAST TECHNIQUE: Multidetector CT imaging of the chest was performed using the standard protocol during bolus administration of intravenous contrast. Multiplanar CT image reconstructions and MIPs were obtained to evaluate the vascular anatomy. CONTRAST:  154mL OMNIPAQUE IOHEXOL 350 MG/ML SOLNx 2 for total of 200 cc. Initial contrast bolus was suboptimal for pulmonary artery evaluation. COMPARISON:  Radiograph earlier this day. Most recent chest CTA 04/14/2019. Lung bases from abdominal CT 02/20/2020 FINDINGS: Cardiovascular: There are no filling defects within the pulmonary arteries to suggest pulmonary embolus. Breathing motion artifact partially obscures assessment. Repeat bolus is better diagnostic quality than initial exam. There is prominence of the central pulmonary arteries, main pulmonary artery diameter 3.5 cm. 6 The thoracic aorta is normal in caliber. No aneurysm or dissection. Mild cardiomegaly. No pericardial effusion. Mediastinum/Nodes:  No enlarged mediastinal or hilar lymph nodes, few prominent bilateral  hilar lymph nodes are not enlarged by size criteria. No esophageal wall thickening. No thyroid nodule. Lungs/Pleura: Breathing motion artifact partially obscures detailed assessment. Improved bibasilar aeration from prior exam with improving patchy bibasilar opacities. There is no new or confluent airspace disease. Slight heterogeneous pulmonary parenchyma suggesting underlying small airways disease. No pleural effusion. The trachea and mainstem bronchi are patent. No convincing pulmonary edema. Upper Abdomen: Gastric bypass anatomy. No acute findings. Musculoskeletal: There are no acute or suspicious osseous abnormalities. Review of the MIP images confirms the above findings. IMPRESSION: 1. No pulmonary embolus. Breathing motion artifact partially obscures assessment. 2. Improved bibasilar aeration from recent abdominal CT with improving patchy bibasilar opacities. 3. Slight heterogeneous pulmonary parenchyma suggesting underlying small airways disease. 4. Mild cardiomegaly. Dilatation of the central pulmonary arteries, can be seen with pulmonary arterial hypertension. Electronically Signed   By: Keith Rake M.D.   On: 02/22/2020 18:45   DG Chest Portable 1 View  Result Date: 02/22/2020 CLINICAL DATA:  Shortness of breath. EXAM: PORTABLE CHEST 1 VIEW COMPARISON:  Feb 20, 2020 FINDINGS: The lungs are hyperinflated. Mild diffuse chronic appearing increased lung markings are seen. There is no evidence of acute infiltrate, pleural effusion or pneumothorax. The heart size and mediastinal contours are within normal limits. The visualized skeletal structures are unremarkable. IMPRESSION: No active disease. Electronically Signed   By: Virgina Norfolk M.D.   On: 02/22/2020 15:48    Impression/Recommendations  1. Aspiration pneumonitis or pneumonia  - Patient reports that cough and SOB have resolved, she has defervesced and now has normal lactate and WBC, improved opacities on CT chest, no tachypnea, and no  hypoxia while ambulating  - PORT/PSI score is low, patient and her family agree with outpatient treatment and will return to ED for any worsening    2. Somnolence  - No focal neurologic deficits, recent head CT unremarkable, and this is most likely related to medications (I.e., Klonopin, Percocet, gabapentin)  - Patient is agreeable to cutting back on sedating medications and limiting gabapentin and Klonopin use to qHS     Thank you for this consultation.  Our [facility] hospitalist team will follow the patient with you.   Time Spent: 60 minutes   Vianne Bulls M.D. [facility] 02/22/2020, 9:10 PM

## 2020-02-22 NOTE — ED Notes (Signed)
Pt advised of need to collect urine sample and she said she felt like she could provide one.  Pt ambulatory to RR w/out assistance. Gait steady. Will check back for urine sample momentarily.

## 2020-02-22 NOTE — ED Triage Notes (Signed)
Pt states she left this morning AMA after being treated for PNA, spoke to family and PCP and decided to return. C/o SOB. Pt states she has not slept in two days.

## 2020-02-22 NOTE — ED Notes (Signed)
Pt ambulated to RR while I monitored oxygen saturation.  Oxygen maintained at or above 93% throughout.

## 2020-02-25 LAB — CULTURE, BLOOD (ROUTINE X 2)
Culture: NO GROWTH
Culture: NO GROWTH
Special Requests: ADEQUATE
Special Requests: ADEQUATE

## 2020-03-26 ENCOUNTER — Encounter (HOSPITAL_COMMUNITY): Payer: Self-pay | Admitting: Emergency Medicine

## 2020-03-26 ENCOUNTER — Other Ambulatory Visit: Payer: Self-pay

## 2020-03-26 ENCOUNTER — Emergency Department (HOSPITAL_COMMUNITY)
Admission: EM | Admit: 2020-03-26 | Discharge: 2020-03-26 | Disposition: A | Discharge status: Home / Self Care | Payer: 59 | Attending: Emergency Medicine | Admitting: Emergency Medicine

## 2020-03-26 ENCOUNTER — Emergency Department (HOSPITAL_COMMUNITY)
Admission: EM | Admit: 2020-03-26 | Discharge: 2020-03-26 | Discharge status: Against Medical Advice | Payer: 59 | Source: Home / Self Care

## 2020-03-26 ENCOUNTER — Emergency Department (HOSPITAL_COMMUNITY): Payer: 59

## 2020-03-26 DIAGNOSIS — M545 Low back pain, unspecified: Secondary | ICD-10-CM

## 2020-03-26 DIAGNOSIS — M549 Dorsalgia, unspecified: Secondary | ICD-10-CM | POA: Insufficient documentation

## 2020-03-26 DIAGNOSIS — R410 Disorientation, unspecified: Secondary | ICD-10-CM

## 2020-03-26 DIAGNOSIS — Z7984 Long term (current) use of oral hypoglycemic drugs: Secondary | ICD-10-CM | POA: Diagnosis not present

## 2020-03-26 DIAGNOSIS — M25521 Pain in right elbow: Secondary | ICD-10-CM | POA: Diagnosis not present

## 2020-03-26 DIAGNOSIS — R4182 Altered mental status, unspecified: Secondary | ICD-10-CM | POA: Diagnosis not present

## 2020-03-26 DIAGNOSIS — Z79899 Other long term (current) drug therapy: Secondary | ICD-10-CM | POA: Insufficient documentation

## 2020-03-26 DIAGNOSIS — G479 Sleep disorder, unspecified: Secondary | ICD-10-CM | POA: Insufficient documentation

## 2020-03-26 DIAGNOSIS — Z5321 Procedure and treatment not carried out due to patient leaving prior to being seen by health care provider: Secondary | ICD-10-CM | POA: Insufficient documentation

## 2020-03-26 LAB — COMPREHENSIVE METABOLIC PANEL
ALT: 14 U/L (ref 0–44)
AST: 21 U/L (ref 15–41)
Albumin: 4 g/dL (ref 3.5–5.0)
Alkaline Phosphatase: 110 U/L (ref 38–126)
Anion gap: 12 (ref 5–15)
BUN: 12 mg/dL (ref 6–20)
CO2: 22 mmol/L (ref 22–32)
Calcium: 9.2 mg/dL (ref 8.9–10.3)
Chloride: 101 mmol/L (ref 98–111)
Creatinine, Ser: 1.05 mg/dL — ABNORMAL HIGH (ref 0.44–1.00)
GFR calc Af Amer: >60 mL/min (ref >60)
GFR calc non Af Amer: 59 mL/min — ABNORMAL LOW (ref >60)
Glucose, Bld: 99 mg/dL (ref 70–99)
Potassium: 3.6 mmol/L (ref 3.5–5.1)
Sodium: 135 mmol/L (ref 135–145)
Total Bilirubin: 0.8 mg/dL (ref 0.3–1.2)
Total Protein: 7.5 g/dL (ref 6.5–8.1)

## 2020-03-26 LAB — TSH: TSH: 3.621 u[IU]/mL (ref 0.350–4.500)

## 2020-03-26 LAB — CBC WITH DIFFERENTIAL/PLATELET
Abs Immature Granulocytes: 0.03 10*3/uL (ref 0.00–0.07)
Basophils Absolute: 0.1 10*3/uL (ref 0.0–0.1)
Basophils Relative: 1 %
Eosinophils Absolute: 0.1 10*3/uL (ref 0.0–0.5)
Eosinophils Relative: 1 %
HCT: 33 % — ABNORMAL LOW (ref 36.0–46.0)
Hemoglobin: 9.6 g/dL — ABNORMAL LOW (ref 12.0–15.0)
Immature Granulocytes: 0 %
Lymphocytes Relative: 28 %
Lymphs Abs: 2.5 10*3/uL (ref 0.7–4.0)
MCH: 22.1 pg — ABNORMAL LOW (ref 26.0–34.0)
MCHC: 29.1 g/dL — ABNORMAL LOW (ref 30.0–36.0)
MCV: 75.9 fL — ABNORMAL LOW (ref 80.0–100.0)
Monocytes Absolute: 0.8 10*3/uL (ref 0.1–1.0)
Monocytes Relative: 9 %
Neutro Abs: 5.6 10*3/uL (ref 1.7–7.7)
Neutrophils Relative %: 61 %
Platelets: 341 10*3/uL (ref 150–400)
RBC: 4.35 MIL/uL (ref 3.87–5.11)
RDW: 17.2 % — ABNORMAL HIGH (ref 11.5–15.5)
WBC: 9.1 10*3/uL (ref 4.0–10.5)
nRBC: 0 % (ref 0.0–0.2)

## 2020-03-26 LAB — AMMONIA: Ammonia: 17 umol/L (ref 9–35)

## 2020-03-26 LAB — ETHANOL: Alcohol, Ethyl (B): <10 mg/dL (ref <10)

## 2020-03-26 MED ORDER — SODIUM CHLORIDE 0.9 % IV BOLUS
1000.0000 mL | Freq: Once | INTRAVENOUS | Status: AC
  Administered 2020-03-26: 1000 mL via INTRAVENOUS

## 2020-03-26 NOTE — ED Triage Notes (Signed)
Patient return after refusing to be seen. Back pain 2 days. Constantly falling asleep in triage states she only took 1 Klonopin tablet. Also reports right elbow pain after falling out of recliner.

## 2020-03-26 NOTE — ED Provider Notes (Signed)
[facility] DEPT Provider Note   CSN: [id number] Arrival date & time: 03/26/20  1338     History Chief Complaint  Patient presents with  . Back Pain  . Elbow Pain    Tammy [name] is a 59 y.o. female with history of anxiety, arthritis, GERD, hypothyroidism, A. fib, obesity status post Roux-en-Y gastric bypass presents for evaluation of acute onset, persistent low back pain.  It is unclear how long her symptoms have been present as triage note mentions that she has been complaining of back pain for 2 days.  She tells me that today she jumped off of a trampoline and landed on her right side on a metal pole.  Denies head injury or loss of consciousness.  She complained to the triage nurse of right elbow pain after falling out of a recliner but when asked about elbow pain by myself she states "I must have landed on it when I fell off the trampoline".  Pain to the back bilateral, radiates from the right side to the left side.  Denies bowel or bladder incontinence, saddle anesthesia, fevers or history of IV drug use.  Has been taking aspirin and applying cool compresses and using warm soaks with little relief per her report.  Denies numbness or weakness of the extremities.  There appeared to be some inconsistencies between what she tells me and what she has told triage nurse.  Triage nurse states that the patient was sent down from the unit in the hospital.  Patient's husband has been admitted to the hospital and the patient has been with him all night but she was sent down here due to belligerent behavior and altered mental status.  The patient endorsed taking 1 tablet of Klonopin to our triage nurse.  Prior to my assessment she was found wandering in our hallways. She told another nurse she saw smoke in the building and called 911. She did not remember talking to me when Dr. Laverta Baltimore went to assess her. She called her husband while Dr. Laverta Baltimore assessed her, and husband states she  has been more drowsy and confused today than normal, and that she has not been making sense.     The history is provided by the patient.       Past Medical History:  Diagnosis Date  . Anxiety   . Arthritis    neck and lower back - deg disc disease, fingers  . Depression   . Frequency of urination   . GERD (gastroesophageal reflux disease)   . History of seizure per pt no seizure since    11-07-2015  in setting of stress and sleep deprivation  /  negative work-up by neurologist unknown idiology  . Hypothyroidism   . Injury of thumb, left    03-31-2016    . Pelvic relaxation   . SUI (stress urinary incontinence, female)     Patient Active Problem List   Diagnosis Date Noted  . Somnolence   . Aspiration pneumonia ([place]) 02/20/2020  . Lactic acidosis 02/20/2020  . Acute metabolic encephalopathy 46/96/2952  . Sepsis ([place]) 02/20/2020  . Chronic pain of both knees 01/08/2020  . Encephalopathy acute 10/27/2019  . Acute encephalopathy 10/27/2019  . CAP (community acquired pneumonia) 07/31/2019  . Atrial fibrillation with RVR ([place]) 07/31/2019  . Morbid obesity with BMI of 50.0-59.9, adult ([place]) 07/31/2019  . Respiratory failure ([place]) 04/14/2019  . Community acquired pneumonia 04/14/2019  . Closed nondisplaced fracture of head of left radius 06/20/2018  .  Sprain of left wrist 06/20/2018  . Peroneal tendinitis, right leg 06/23/2017  . Trochanteric bursitis, right hip 06/23/2017  . Acute right-sided low back pain with left-sided sciatica 03/24/2017  . Pain in right hip 03/24/2017  . Impingement syndrome of left shoulder 08/30/2016  . Impingement syndrome of right shoulder 08/30/2016  . Plantar fasciitis, bilateral 08/30/2016  . Pelvic relaxation 04/13/2016    Class: Present on Admission  . S/P hysterectomy 04/12/2016  . Depression 11/16/2015  . Generalized anxiety disorder 11/16/2015  . Convulsion ([place]) 11/16/2015  . SUI (stress urinary incontinence, female) 09/26/2012     Past Surgical History:  Procedure Laterality Date  . ANTERIOR AND POSTERIOR REPAIR  09/25/2012   Procedure: ANTERIOR (CYSTOCELE) AND POSTERIOR REPAIR (RECTOCELE);  Surgeon: Lahoma Crocker, MD;  Location: [facility];  Service: Gynecology;  Laterality: N/A;  . ANTERIOR AND POSTERIOR REPAIR WITH SACROSPINOUS FIXATION N/A 04/12/2016   Procedure: ANTERIOR AND POSTERIOR REPAIR WITH SACROSPINOUS LIGAMENT SUSPENSION, CYSTOSCOPY;  Surgeon: Arvella Nigh, MD;  Location: [facility];  Service: Gynecology;  Laterality: N/A;  . APPENDECTOMY  1987  . BREAST EXCISIONAL BIOPSY Left    No scar visable   . BREAST REDUCTION SURGERY  2000  aprrox  . ENDOMETRIAL ABLATION W/ NOVASURE  2011   in office  . EXCISION LEFT BREAST BX  12-07-1999   benign  . LAPAROSCOPIC CHOLECYSTECTOMY  02-09-2005  . LAPAROSCOPIC VAGINAL HYSTERECTOMY WITH SALPINGO OOPHORECTOMY Bilateral 04/12/2016   Procedure: LAPAROSCOPIC ASSISTED VAGINAL HYSTERECTOMY WITH SALPINGO OOPHORECTOMY;  Surgeon: Arvella Nigh, MD;  Location: [facility];  Service: Gynecology;  Laterality: Bilateral;  . REDUCTION MAMMAPLASTY Bilateral   . ROUX-EN-Y GASTRIC BYPASS  06/  2002  . TUBAL LIGATION  10/ 1990     OB History   No obstetric history on file.     Family History  Problem Relation Age of Onset  . Colon polyps Mother   . Heart disease Father 77       CABG  . Breast cancer Maternal Aunt   . Stomach cancer Maternal Grandmother   . Diabetes Maternal Grandmother   . Heart disease Maternal Grandmother   . Stomach cancer Maternal Uncle   . Colon cancer Maternal Uncle        x3  . Diabetes Maternal Aunt        Type I  . Diabetes Maternal Uncle        x5 - Type II  . Heart disease Paternal Grandmother   . Esophageal cancer Neg Hx   . Gallbladder disease Neg Hx   . Seizures Neg Hx     Social History   Tobacco Use  . Smoking status: Never Smoker  . Smokeless tobacco: Never Used  Substance Use Topics  .  Alcohol use: No    Comment: drinks daily - none since 08/2012 per pt.  . Drug use: No    Home Medications Prior to Admission medications   Medication Sig Start Date End Date Taking? Authorizing Provider  apixaban (ELIQUIS) 5 MG TABS tablet Take 5 mg by mouth 2 (two) times daily.   Yes [provider]  Ascorbic Acid (VITAMIN C) 1000 MG tablet Take 1,000 mg by mouth daily.   Yes [provider]  buPROPion (WELLBUTRIN XL) 150 MG 24 hr tablet Take 150 mg by mouth in the morning and at bedtime.  12/17/19  Yes [provider]  clonazePAM (KLONOPIN) 1 MG tablet Take 1 mg by mouth 3 (three) times daily as needed  for anxiety.  03/24/20  Yes [provider]  diltiazem (CARDIZEM CD) 300 MG 24 hr capsule Take 1 capsule (300 mg total) by mouth daily. 09/04/19 03/26/20 Yes Minus Breeding, MD  FLUoxetine (PROZAC) 20 MG capsule Take 20 mg by mouth at bedtime.  07/26/18  Yes [provider]  furosemide (LASIX) 20 MG tablet TAKE ONE TABLET BY MOUTH TWICE A DAY Patient taking differently: Take 20 mg by mouth 2 (two) times daily.  02/11/20  Yes Sherran Needs, NP  gabapentin (NEURONTIN) 600 MG tablet Take 600 mg by mouth 2 (two) times daily.    Yes [provider]  metoprolol succinate (TOPROL-XL) 25 MG 24 hr tablet TAKE ONE TABLET BY MOUTH AT BEDTIME Patient taking differently: Take 25 mg by mouth at bedtime.  02/05/20  Yes Sherran Needs, NP  potassium chloride SA (KLOR-CON M20) 20 MEQ tablet Take 1 tablet (20 mEq total) by mouth daily. 11/16/19  Yes Sherran Needs, NP  apixaban (ELIQUIS) 5 MG TABS tablet Take 1 tablet (5 mg total) by mouth 2 (two) times daily. Patient not taking: Reported on 03/26/2020 09/03/19 03/26/20  Lendon Colonel, NP  clonazePAM (KLONOPIN) 0.5 MG tablet Take 0.5 tablets (0.25 mg total) by mouth 3 (three) times daily as needed for anxiety. Patient not taking: Reported on 03/26/2020 10/28/19   Dana Allan I, MD  Diclofenac Sodium  (PENNSAID) 2 % SOLN Apply 2 g topically 2 (two) times daily as needed (to affected area). Patient not taking: Reported on 03/26/2020 01/08/20   Leandrew Koyanagi, MD  gabapentin (NEURONTIN) 300 MG capsule Take 1 capsule (300 mg total) by mouth 2 (two) times daily. Patient not taking: Reported on 02/20/2020 10/28/19   Dana Allan I, MD  OLANZapine (ZYPREXA) 10 MG tablet Take 10 mg by mouth at bedtime. 04/04/19   [provider]    Allergies    Nsaids, Adhesive [tape], Latex, and Sulfa antibiotics  Review of Systems   Review of Systems  Constitutional: Negative for chills and fever.  Respiratory: Negative for shortness of breath.   Cardiovascular: Negative for chest pain.  Gastrointestinal: Negative for abdominal pain, nausea and vomiting.  Musculoskeletal: Positive for arthralgias and back pain.  Neurological: Negative for weakness and numbness.  Psychiatric/Behavioral: Positive for confusion.  All other systems reviewed and are negative.   Physical Exam Updated Vital Signs BP (!) 162/83   Pulse 88   Temp 97.7 F (36.5 C) (Oral)   Resp 16   LMP 09/30/2012   SpO2 99%   Physical Exam Vitals and nursing note reviewed.  Constitutional:      General: She is not in acute distress.    Appearance: She is well-developed. She is obese.     Comments: Drowsy but alert. Occasionally doses off but easily arousable.  Initially she was found sitting in a chair leaning to the left side.  HENT:     Head: Normocephalic and atraumatic.     Comments: No Battle's signs, no raccoon's eyes, no rhinorrhea. No hemotympanum. No tenderness to palpation of the face or skull. No deformity, crepitus, or swelling noted.     Mouth/Throat:     Mouth: Mucous membranes are dry.  Eyes:     General:        Right eye: No discharge.        Left eye: No discharge.     Conjunctiva/sclera: Conjunctivae normal.     Pupils: Pupils are equal, round, and reactive to light.  Comments: Pupils pinpoint,  minimally reactive to light  Neck:     Vascular: No JVD.     Trachea: No tracheal deviation.  Cardiovascular:     Rate and Rhythm: Normal rate and regular rhythm.     Heart sounds: Normal heart sounds.  Pulmonary:     Effort: Pulmonary effort is normal.     Breath sounds: Normal breath sounds.  Abdominal:     General: Bowel sounds are normal. There is no distension.     Palpations: Abdomen is soft.     Tenderness: There is no abdominal tenderness. There is no guarding or rebound.  Musculoskeletal:        General: Tenderness present.     Cervical back: Neck supple.     Comments: Diffuse midline lumbar spine tenderness with mild paralumbar muscle tenderness.  No deformity, crepitus, or step-off.  5/5 strength of BUE and BLE major muscle groups.  No tenderness to palpation of the left elbow, no crepitus or deformity.  Normal active and passive range of motion.  Skin:    General: Skin is warm and dry.     Findings: No erythema.  Neurological:     Mental Status: She is alert.     Comments: Patient drowsy but easily arousable.  She is oriented to person and place but not time.  Moves all extremities spontaneous without difficulty.  Cranial nerves II through XII tested and intact.  Psychiatric:        Behavior: Behavior normal.     ED Results / Procedures / Treatments   Labs (all labs ordered are listed, but only abnormal results are displayed) Labs Reviewed  COMPREHENSIVE METABOLIC PANEL - Abnormal; Notable for the following components:      Result Value   Creatinine, Ser 1.05 (*)    GFR calc non Af Amer 59 (*)    All other components within normal limits  CBC WITH DIFFERENTIAL/PLATELET - Abnormal; Notable for the following components:   Hemoglobin 9.6 (*)    HCT 33.0 (*)    MCV 75.9 (*)    MCH 22.1 (*)    MCHC 29.1 (*)    RDW 17.2 (*)    All other components within normal limits  ETHANOL  TSH  AMMONIA  URINALYSIS, ROUTINE W REFLEX MICROSCOPIC  RAPID URINE DRUG SCREEN,  HOSP PERFORMED    EKG None  Radiology DG Chest 2 View  Result Date: 03/26/2020 CLINICAL DATA:  Back pain x2 days. EXAM: CHEST - 2 VIEW COMPARISON:  Feb 22, 2020 FINDINGS: There is no evidence of acute infiltrate, pleural effusion or pneumothorax. The heart size and mediastinal contours are within normal limits. There is moderate severity dextroscoliosis of the lower thoracic and upper lumbar spine. IMPRESSION: No active cardiopulmonary disease. Electronically Signed   By: Virgina Norfolk M.D.   On: 03/26/2020 17:18   DG Lumbar Spine Complete  Result Date: 03/26/2020 CLINICAL DATA:  Back pain. EXAM: LUMBAR SPINE - COMPLETE 4+ VIEW COMPARISON:  July 30, 2019 FINDINGS: There is no evidence of lumbar spine fracture. Alignment is normal. Moderate severity endplate sclerosis is seen at the levels of L4-L5 and L5-S1. Mild endplate sclerosis is seen throughout the remainder of the lumbar spine. There is moderate to marked severity multilevel intervertebral disc space narrowing. This is more prominent at the level of L4-L5. Mild vacuum disc phenomenon is seen at the level of L5-S1. Radiopaque surgical clips are seen throughout the abdomen and pelvis. IMPRESSION: 1. No acute findings. 2. Multilevel degenerative  disc disease in the lumbar spine, most prominent at L4-L5 and L5-S1. Electronically Signed   By: Virgina Norfolk M.D.   On: 03/26/2020 17:20   CT Head Wo Contrast  Result Date: 03/26/2020 CLINICAL DATA:  Altered mental status fell out of recliner EXAM: CT HEAD WITHOUT CONTRAST TECHNIQUE: Contiguous axial images were obtained from the base of the skull through the vertex without intravenous contrast. COMPARISON:  CT brain 02/20/2020, 10/27/2019, 07/30/2019 FINDINGS: Brain: No evidence of acute infarction, hemorrhage, hydrocephalus, extra-axial collection or mass lesion/mass effect. Vascular: No hyperdense vessel or unexpected calcification. Skull: Normal. Negative for fracture or focal lesion.  Sinuses/Orbits: No acute finding. Other: None. IMPRESSION: Negative non contrasted CT appearance of the brain. Electronically Signed   By: Donavan Foil M.D.   On: 03/26/2020 17:42    Procedures Procedures (including critical care time)  Medications Ordered in ED Medications  sodium chloride 0.9 % bolus 1,000 mL (0 mLs Intravenous Stopped 03/26/20 1919)    ED Course  I have reviewed the triage vital signs and the nursing notes.  Pertinent labs & imaging results that were available during my care of the patient were reviewed by me and considered in my medical decision making (see chart for details).    MDM Rules/Calculators/A&P                          Patient presents for evaluation of low back pain. She is afebrile, mildly hypertensive in the ED. Vital signs otherwise stable. No red flag signs concerning for cauda equina or spinal abscess. She is ambulatory in the ED despite pain. No focal midline spine tenderness. On my initial assessment she is drowsy, exhibits slurred speech, appears confused. The history she provides to me is inconsistent with the history provided to the triage nurse and she also has told other nurses and Dr. Laverta Baltimore different things regarding why she is here. She spoke to her husband on the phone while Dr. Laverta Baltimore was assessing her and husband notes that today she appears confused. Will order lab work and imaging to rule out organic causes of altered mental status.  Lab work reviewed and interpreted by myself shows no leukocytosis, stable anemia, no metabolic derangements or significant renal insufficiency. Ethanol is negative. TSH and ammonia levels are within normal limits. She attempted to give [place] a urine sample but apparently dropped a urine cup and lost the sample while in the ED. Imaging of the lumbar spine shows no acute osseous abnormalities. Chest x-ray shows no evidence of pneumonia, pleural effusion, or aspiration. Head CT shows no evidence of acute intracranial  abnormality, skull fracture, or intracranial hemorrhage.  I was informed by the patient's RN that she eloped from the emergency department without her knowledge. I was unable to reassess the patient prior to this. I suspect that her symptoms are more psychiatric in origin as opposed to metabolic or hepatic encephalopathy for example. She has been admitted for respiratory failure in the past but shows no evidence of this today. She does not appear to be an imminent threat to herself or others at this time. She was seen and evaluated by Dr. Laverta Baltimore who agrees with assessment and plan at this time.   Final Clinical Impression(s) / ED Diagnoses Final diagnoses:  Acute bilateral low back pain without sciatica  Altered mental status, unspecified altered mental status type  Confusion    Rx / DC Orders ED Discharge Orders    None  Renita Papa, PA-C 03/27/20 0004    Margette Fast, MD 03/27/20 2041

## 2020-03-26 NOTE — ED Notes (Signed)
RN went to check on the pt and the pt was not in the room. Pt also not in the department. MD/PA made aware.

## 2020-03-26 NOTE — ED Notes (Signed)
Pt ambulatory to restroom with no issues. Pt was told to provide a urine sample. When RN asked if she was able to, pt stated "I dropped the cup" and was unable to obtain sample.

## 2020-03-26 NOTE — ED Triage Notes (Signed)
Patient here from home reporting back pain x2 days. Hx of same. Patient falls asleep in triage, reports "I just took a Klonopin".

## 2020-03-28 ENCOUNTER — Other Ambulatory Visit: Payer: Self-pay | Admitting: Adult Health

## 2020-03-28 NOTE — Telephone Encounter (Signed)
58 F 112.2 kg, SCr 1.05, LOV 08/2019 [facility]

## 2020-05-08 ENCOUNTER — Other Ambulatory Visit (HOSPITAL_COMMUNITY): Payer: Self-pay | Admitting: Nurse Practitioner

## 2020-06-03 ENCOUNTER — Ambulatory Visit: Payer: 59 | Admitting: Orthopaedic Surgery

## 2020-06-11 ENCOUNTER — Ambulatory Visit: Payer: 59 | Admitting: Orthopaedic Surgery

## 2020-06-15 ENCOUNTER — Other Ambulatory Visit (HOSPITAL_COMMUNITY): Payer: Self-pay | Admitting: Nurse Practitioner

## 2020-07-01 ENCOUNTER — Ambulatory Visit: Payer: 59

## 2020-07-21 ENCOUNTER — Encounter (HOSPITAL_COMMUNITY): Payer: Self-pay

## 2020-07-21 ENCOUNTER — Other Ambulatory Visit: Payer: Self-pay

## 2020-07-21 ENCOUNTER — Ambulatory Visit (HOSPITAL_COMMUNITY)
Admission: EM | Admit: 2020-07-21 | Discharge: 2020-07-21 | Disposition: A | Discharge status: Home / Self Care | Payer: 59 | Attending: Family Medicine | Admitting: Family Medicine

## 2020-07-21 ENCOUNTER — Ambulatory Visit (INDEPENDENT_AMBULATORY_CARE_PROVIDER_SITE_OTHER): Payer: 59

## 2020-07-21 DIAGNOSIS — S52134A Nondisplaced fracture of neck of right radius, initial encounter for closed fracture: Secondary | ICD-10-CM

## 2020-07-21 DIAGNOSIS — S6991XA Unspecified injury of right wrist, hand and finger(s), initial encounter: Secondary | ICD-10-CM

## 2020-07-21 DIAGNOSIS — S59901A Unspecified injury of right elbow, initial encounter: Secondary | ICD-10-CM

## 2020-07-21 MED ORDER — OXYCODONE-ACETAMINOPHEN 5-325 MG PO TABS: 1.0000 | ORAL_TABLET | Freq: Three times a day (TID) | ORAL | 0 refills | Status: AC | PRN

## 2020-07-21 MED ORDER — KETOROLAC TROMETHAMINE 30 MG/ML IJ SOLN
INTRAMUSCULAR | Status: AC
  Filled 2020-07-21: qty 1

## 2020-07-21 MED ORDER — KETOROLAC TROMETHAMINE 30 MG/ML IJ SOLN
30.0000 mg | Freq: Once | INTRAMUSCULAR | Status: AC
  Administered 2020-07-21: 30 mg via INTRAVENOUS

## 2020-07-21 NOTE — ED Provider Notes (Signed)
[facility]    CSN: [id number] Arrival date & time: 07/21/20  1251    History   Chief Complaint Chief Complaint  Patient presents with  . Fall    HPI Tammy [name] is a 59 y.o. female presenting to urgent care reporting that last week on Wednesday 10/13 the patient was walking her dog when she was pulled by the dog and fell forward onto her outstretched wrists.  She reports continued, unchanged pain to her right wrist and forearm since she fell (5 days ago).  She denies any loss of sensation, but strength is decreased secondary to pain.  She has not tried anything at home to help her pain, was hoping it would go away on its own, but the pain has not gone away.  HPI  Past Medical History:  Diagnosis Date  . Anxiety   . Arthritis    neck and lower back - deg disc disease, fingers  . Depression   . Frequency of urination   . GERD (gastroesophageal reflux disease)   . History of seizure per pt no seizure since    11-07-2015  in setting of stress and sleep deprivation  /  negative work-up by neurologist unknown idiology  . Hypothyroidism   . Injury of thumb, left    03-31-2016    . Pelvic relaxation   . SUI (stress urinary incontinence, female)     Patient Active Problem List   Diagnosis Date Noted  . Somnolence   . Aspiration pneumonia ([place]) 02/20/2020  . Lactic acidosis 02/20/2020  . Acute metabolic encephalopathy 32/99/2426  . Sepsis ([place]) 02/20/2020  . Chronic pain of both knees 01/08/2020  . Encephalopathy acute 10/27/2019  . Acute encephalopathy 10/27/2019  . CAP (community acquired pneumonia) 07/31/2019  . Atrial fibrillation with RVR ([place]) 07/31/2019  . Morbid obesity with BMI of 50.0-59.9, adult ([place]) 07/31/2019  . Respiratory failure ([place]) 04/14/2019  . Community acquired pneumonia 04/14/2019  . Closed nondisplaced fracture of head of left radius 06/20/2018  . Sprain of left wrist 06/20/2018  . Peroneal tendinitis, right leg 06/23/2017  .  Trochanteric bursitis, right hip 06/23/2017  . Acute right-sided low back pain with left-sided sciatica 03/24/2017  . Pain in right hip 03/24/2017  . Impingement syndrome of left shoulder 08/30/2016  . Impingement syndrome of right shoulder 08/30/2016  . Plantar fasciitis, bilateral 08/30/2016  . Pelvic relaxation 04/13/2016    Class: Present on Admission  . S/P hysterectomy 04/12/2016  . Depression 11/16/2015  . Generalized anxiety disorder 11/16/2015  . Convulsion ([place]) 11/16/2015  . SUI (stress urinary incontinence, female) 09/26/2012    Past Surgical History:  Procedure Laterality Date  . ANTERIOR AND POSTERIOR REPAIR  09/25/2012   Procedure: ANTERIOR (CYSTOCELE) AND POSTERIOR REPAIR (RECTOCELE);  Surgeon: Lahoma Crocker, MD;  Location: [facility];  Service: Gynecology;  Laterality: N/A;  . ANTERIOR AND POSTERIOR REPAIR WITH SACROSPINOUS FIXATION N/A 04/12/2016   Procedure: ANTERIOR AND POSTERIOR REPAIR WITH SACROSPINOUS LIGAMENT SUSPENSION, CYSTOSCOPY;  Surgeon: Arvella Nigh, MD;  Location: [facility];  Service: Gynecology;  Laterality: N/A;  . APPENDECTOMY  1987  . BREAST EXCISIONAL BIOPSY Left    No scar visable   . BREAST REDUCTION SURGERY  2000  aprrox  . ENDOMETRIAL ABLATION W/ NOVASURE  2011   in office  . EXCISION LEFT BREAST BX  12-07-1999   benign  . LAPAROSCOPIC CHOLECYSTECTOMY  02-09-2005  . LAPAROSCOPIC VAGINAL HYSTERECTOMY WITH SALPINGO OOPHORECTOMY Bilateral 04/12/2016   Procedure: LAPAROSCOPIC ASSISTED  VAGINAL HYSTERECTOMY WITH SALPINGO OOPHORECTOMY;  Surgeon: Arvella Nigh, MD;  Location: [facility];  Service: Gynecology;  Laterality: Bilateral;  . REDUCTION MAMMAPLASTY Bilateral   . ROUX-EN-Y GASTRIC BYPASS  06/  2002  . TUBAL LIGATION  10/ 1990    OB History   No obstetric history on file.      Home Medications    Prior to Admission medications   Medication Sig Start Date End Date Taking? Authorizing Provider  apixaban  (ELIQUIS) 5 MG TABS tablet Take 5 mg by mouth 2 (two) times daily.    [provider]  Ascorbic Acid (VITAMIN C) 1000 MG tablet Take 1,000 mg by mouth daily.    [provider]  buPROPion (WELLBUTRIN XL) 150 MG 24 hr tablet Take 150 mg by mouth in the morning and at bedtime.  12/17/19   [provider]  clonazePAM (KLONOPIN) 0.5 MG tablet Take 0.5 tablets (0.25 mg total) by mouth 3 (three) times daily as needed for anxiety. Patient not taking: Reported on 03/26/2020 10/28/19   Dana Allan I, MD  clonazePAM (KLONOPIN) 1 MG tablet Take 1 mg by mouth 3 (three) times daily as needed for anxiety.  03/24/20   [provider]  Diclofenac Sodium (PENNSAID) 2 % SOLN Apply 2 g topically 2 (two) times daily as needed (to affected area). Patient not taking: Reported on 03/26/2020 01/08/20   Leandrew Koyanagi, MD  diltiazem (CARDIZEM CD) 300 MG 24 hr capsule Take 1 capsule (300 mg total) by mouth daily. 09/04/19 03/26/20  Minus Breeding, MD  ELIQUIS 5 MG TABS tablet TAKE ONE TABLET BY MOUTH TWICE A DAY 03/28/20   Minus Breeding, MD  FLUoxetine (PROZAC) 20 MG capsule Take 20 mg by mouth at bedtime.  07/26/18   [provider]  furosemide (LASIX) 20 MG tablet TAKE ONE TABLET BY MOUTH TWICE A DAY 05/08/20   Sherran Needs, NP  gabapentin (NEURONTIN) 300 MG capsule Take 1 capsule (300 mg total) by mouth 2 (two) times daily. Patient not taking: Reported on 02/20/2020 10/28/19   Dana Allan I, MD  gabapentin (NEURONTIN) 600 MG tablet Take 600 mg by mouth 2 (two) times daily.     [provider]  KLOR-CON M20 20 MEQ tablet TAKE ONE TABLET BY MOUTH DAILY 06/16/20   Sherran Needs, NP  metoprolol succinate (TOPROL-XL) 25 MG 24 hr tablet TAKE ONE TABLET BY MOUTH AT BEDTIME Patient taking differently: Take 25 mg by mouth at bedtime.  02/05/20   Sherran Needs, NP  OLANZapine (ZYPREXA) 10 MG tablet Take 10 mg by mouth at bedtime. 04/04/19   [provider]     Family History Family History  Problem Relation Age of Onset  . Colon polyps Mother   . Heart disease Father 55       CABG  . Breast cancer Maternal Aunt   . Stomach cancer Maternal Grandmother   . Diabetes Maternal Grandmother   . Heart disease Maternal Grandmother   . Stomach cancer Maternal Uncle   . Colon cancer Maternal Uncle        x3  . Diabetes Maternal Aunt        Type I  . Diabetes Maternal Uncle        x5 - Type II  . Heart disease Paternal Grandmother   . Esophageal cancer Neg Hx   . Gallbladder disease Neg Hx   . Seizures Neg Hx     Social History Social History  Tobacco Use  . Smoking status: Never Smoker  . Smokeless tobacco: Never Used  Substance Use Topics  . Alcohol use: No    Comment: drinks daily - none since 08/2012 per pt.  . Drug use: No     Allergies   Nsaids, Adhesive [tape], Latex, and Sulfa antibiotics   Review of Systems Review of Systems   Physical Exam Triage Vital Signs ED Triage Vitals  Enc Vitals Group     BP 07/21/20 1444 (!) 148/70     Pulse Rate 07/21/20 1444 68     Resp 07/21/20 1444 17     Temp 07/21/20 1444 97.8 F (36.6 C)     Temp Source 07/21/20 1444 Oral     SpO2 07/21/20 1444 100 %     Weight --      Height --      Head Circumference --      Peak Flow --      Pain Score 07/21/20 1443 8     Pain Loc --      Pain Edu? --      Excl. in [place]? --    No data found.  Updated Vital Signs BP (!) 148/70 (BP Location: Right Arm)   Pulse 68   Temp 97.8 F (36.6 C) (Oral)   Resp 17   LMP 09/30/2012   SpO2 100%   Physical exam: General: Well-appearing, no apparent distress Respiratory: Comfortable work of breathing, speaking complete sentences Right Elbow: - Inspection: no obvious deformity. No swelling, erythema or bruising - Palpation: TTP appreciated to lateral epicondyles and radial head, also to brachioradialis - ROM: Patient unable to supinate secondary to pain - Strength: 4/5 strength in wrist  flexion and extension secondary to pain. - Neuro: NV intact distally Right Wrist Inspection: No obvious deformity. No swelling, erythema or bruising Palpation: TTP appreciated to extensor surface of right wrist ROM: Full ROM of the digits and wrist Strength: 4/5 strength in the forearm, wrist and interosseus muscles secondary to pain Neurovascular: NV intact    UC Treatments / Results  Labs (all labs ordered are listed, but only abnormal results are displayed) Labs Reviewed - No data to display  EKG   Radiology DG Elbow Complete Right  Result Date: 07/21/2020 CLINICAL DATA:  Elbow pain following fall several days ago, initial encounter EXAM: RIGHT ELBOW - COMPLETE 3+ VIEW COMPARISON:  None. FINDINGS: Elevation of the anterior fat pad is noted consistent with joint effusion. Mild irregularity at junction of the radial head and radial neck is noted consistent with a mildly impacted proximal radial fracture. This is best seen on the lateral projection. No other focal abnormality is noted. IMPRESSION: Proximal radial fracture best appreciated on the lateral projection. Electronically Signed   By: Inez Catalina M.D.   On: 07/21/2020 17:01   DG Wrist Complete Right  Result Date: 07/21/2020 CLINICAL DATA:  Fall.  Pain EXAM: RIGHT WRIST - COMPLETE 3+ VIEW COMPARISON:  None. FINDINGS: There is no evidence of fracture or dislocation. There is no evidence of arthropathy or other focal bone abnormality. Soft tissues are unremarkable. IMPRESSION: Negative. Electronically Signed   By: Franchot Gallo M.D.   On: 07/21/2020 17:00    Procedures Procedures (including critical care time)  Medications Ordered in UC Medications  ketorolac (TORADOL) 30 MG/ML injection 30 mg (30 mg Intravenous Given 07/21/20 1714)    Initial Impression / Assessment and Plan / UC Course  I have reviewed the triage vital signs and the nursing  notes.  Pertinent labs & imaging results that were available during my care  of the patient were reviewed by me and considered in my medical decision making (see chart for details).    Right radial neck fracture: Xrays revealing R radial neck fracture, nondisplaced. -Patient splinted with long arm splint, arm placed in sling -Given toradol shot in the clinic to help with pain (patient drove herself) -Given prescription for Percocet 5-325 to be taken every 8 hours as needed for severe pain, only given 9 pills (patient also taking Clonazepam) -Asked to call her Orthopedist Dr. Erlinda Hong tomorrow morning to schedule follow up appt   Final Clinical Impressions(s) / UC Diagnoses   Final diagnoses:  None   Discharge Instructions   None    ED Prescriptions    None     PDMP not reviewed this encounter.     Milus Banister, [place], PGY-3 07/21/2020 5:45 PM    Daisy Floro, DO 07/21/20 1746

## 2020-07-21 NOTE — Discharge Instructions (Addendum)
Thank you for coming in to see [place] today! Please see below to review our plan for today's visit:  1. It appears that you have a radial head fracture located near your elbow. We will splint and sling your arm to decrease the movement and help it to heal. 2. I have sent a prescription for Percocet to your pharmacy. You received a Toradol injection today in the office.  3. Please call Dr. Erlinda Hong (your orthopedist) to schedule a follow up appointment with him.   It was our pleasure to serve you!   Dr. Milus Banister [facility]

## 2020-07-21 NOTE — Progress Notes (Signed)
Orthopedic Tech Progress Note Patient Details:  Tammy [name] 1961-02-01 [id number]  Ortho Devices Type of Ortho Device: Long arm splint Ortho Device/Splint Location: URE Ortho Device/Splint Interventions: Ordered, Application   Post Interventions Patient Tolerated: Well Instructions Provided: Care of device, Adjustment of device   Tammy [name] 07/21/2020, 6:34 PM

## 2020-07-21 NOTE — ED Triage Notes (Signed)
Pt presents with right wrist & arm injury after a fall on Wednesday 10/13.

## 2020-07-24 ENCOUNTER — Ambulatory Visit (INDEPENDENT_AMBULATORY_CARE_PROVIDER_SITE_OTHER): Payer: 59 | Admitting: Orthopaedic Surgery

## 2020-07-24 ENCOUNTER — Other Ambulatory Visit: Payer: Self-pay

## 2020-07-24 ENCOUNTER — Encounter: Payer: Self-pay | Admitting: Orthopaedic Surgery

## 2020-07-24 DIAGNOSIS — S52134A Nondisplaced fracture of neck of right radius, initial encounter for closed fracture: Secondary | ICD-10-CM

## 2020-07-24 MED ORDER — HYDROCODONE-ACETAMINOPHEN 5-325 MG PO TABS: 1.0000 | ORAL_TABLET | Freq: Every day | ORAL | 0 refills | Status: DC | PRN

## 2020-07-24 NOTE — Progress Notes (Signed)
Office Visit Note   Patient: Tammy [name]           Date of Birth: 1961-08-09           MRN: [id number] Visit Date: 07/24/2020              Requested by: [name], MD No address on file PCP: [name], MD   Assessment & Plan: Visit Diagnoses:  1. Closed nondisplaced fracture of neck of right radius, initial encounter     Plan: Impression is nondisplaced right radial neck fracture.  We will plan to immobilize for another week in a sling and then can begin gentle range of motion.  Prescription for hydrocodone.  Recheck in 2 weeks with two-view x-rays of the right elbow.  Follow-Up Instructions: Return in about 2 weeks (around 08/07/2020).   Orders:  No orders of the defined types were placed in this encounter.  Meds ordered this encounter  Medications  . HYDROcodone-acetaminophen (NORCO) 5-325 MG tablet    Sig: Take 1-2 tablets by mouth daily as needed.    Dispense:  20 tablet    Refill:  0      Procedures: No procedures performed   Clinical Data: No additional findings.   Subjective: Chief Complaint  Patient presents with  . Right Elbow - Pain  . Right Wrist - Pain    Tammy [name] is a 59 year old female who is well-known to me comes in for an acute injury to her right elbow.  She tripped and fell 3 days ago while walking her dog and sustained a nondisplaced right radial neck fracture.  She was evaluated at the urgent care originally.  Placed in a posterior long-arm splint.  Follows up today for further treatment.   Review of Systems  Constitutional: Negative.   HENT: Negative.   Eyes: Negative.   Respiratory: Negative.   Cardiovascular: Negative.   Endocrine: Negative.   Musculoskeletal: Negative.   Neurological: Negative.   Hematological: Negative.   Psychiatric/Behavioral: Negative.   All other systems reviewed and are negative.    Objective: Vital Signs: LMP 09/30/2012   Physical Exam Vitals and nursing note reviewed.  Constitutional:       Appearance: She is well-developed.  Pulmonary:     Effort: Pulmonary effort is normal.  Skin:    General: Skin is warm.     Capillary Refill: Capillary refill takes less than 2 seconds.  Neurological:     Mental Status: She is alert and oriented to person, place, and time.  Psychiatric:        Behavior: Behavior normal.        Thought Content: Thought content normal.        Judgment: Judgment normal.     Ortho Exam Right elbow shows slight tenderness over the radial head.  Range of motion of the elbow is relatively well-preserved.  Neurovascular intact distally. Specialty Comments:  No specialty comments available.  Imaging: No results found.   PMFS History: Patient Active Problem List   Diagnosis Date Noted  . Closed nondisplaced fracture of neck of right radius 07/21/2020  . Somnolence   . Aspiration pneumonia ([place]) 02/20/2020  . Lactic acidosis 02/20/2020  . Acute metabolic encephalopathy 67/67/2094  . Sepsis ([place]) 02/20/2020  . Chronic pain of both knees 01/08/2020  . Encephalopathy acute 10/27/2019  . Acute encephalopathy 10/27/2019  . CAP (community acquired pneumonia) 07/31/2019  . Atrial fibrillation with RVR ([place]) 07/31/2019  . Morbid obesity with BMI of 50.0-59.9,  adult ([place]) 07/31/2019  . Respiratory failure ([place]) 04/14/2019  . Community acquired pneumonia 04/14/2019  . Closed nondisplaced fracture of head of left radius 06/20/2018  . Sprain of left wrist 06/20/2018  . Peroneal tendinitis, right leg 06/23/2017  . Trochanteric bursitis, right hip 06/23/2017  . Acute right-sided low back pain with left-sided sciatica 03/24/2017  . Pain in right hip 03/24/2017  . Impingement syndrome of left shoulder 08/30/2016  . Impingement syndrome of right shoulder 08/30/2016  . Plantar fasciitis, bilateral 08/30/2016  . Pelvic relaxation 04/13/2016    Class: Present on Admission  . S/P hysterectomy 04/12/2016  . Depression 11/16/2015  . Generalized anxiety  disorder 11/16/2015  . Convulsion ([place]) 11/16/2015  . SUI (stress urinary incontinence, female) 09/26/2012   Past Medical History:  Diagnosis Date  . Anxiety   . Arthritis    neck and lower back - deg disc disease, fingers  . Depression   . Frequency of urination   . GERD (gastroesophageal reflux disease)   . History of seizure per pt no seizure since    11-07-2015  in setting of stress and sleep deprivation  /  negative work-up by neurologist unknown idiology  . Hypothyroidism   . Injury of thumb, left    03-31-2016    . Pelvic relaxation   . SUI (stress urinary incontinence, female)     Family History  Problem Relation Age of Onset  . Colon polyps Mother   . Heart disease Father 21       CABG  . Breast cancer Maternal Aunt   . Stomach cancer Maternal Grandmother   . Diabetes Maternal Grandmother   . Heart disease Maternal Grandmother   . Stomach cancer Maternal Uncle   . Colon cancer Maternal Uncle        x3  . Diabetes Maternal Aunt        Type I  . Diabetes Maternal Uncle        x5 - Type II  . Heart disease Paternal Grandmother   . Esophageal cancer Neg Hx   . Gallbladder disease Neg Hx   . Seizures Neg Hx     Past Surgical History:  Procedure Laterality Date  . ANTERIOR AND POSTERIOR REPAIR  09/25/2012   Procedure: ANTERIOR (CYSTOCELE) AND POSTERIOR REPAIR (RECTOCELE);  Surgeon: Lahoma Crocker, MD;  Location: [facility];  Service: Gynecology;  Laterality: N/A;  . ANTERIOR AND POSTERIOR REPAIR WITH SACROSPINOUS FIXATION N/A 04/12/2016   Procedure: ANTERIOR AND POSTERIOR REPAIR WITH SACROSPINOUS LIGAMENT SUSPENSION, CYSTOSCOPY;  Surgeon: Arvella Nigh, MD;  Location: [facility];  Service: Gynecology;  Laterality: N/A;  . APPENDECTOMY  1987  . BREAST EXCISIONAL BIOPSY Left    No scar visable   . BREAST REDUCTION SURGERY  2000  aprrox  . ENDOMETRIAL ABLATION W/ NOVASURE  2011   in office  . EXCISION LEFT BREAST BX  12-07-1999   benign  .  LAPAROSCOPIC CHOLECYSTECTOMY  02-09-2005  . LAPAROSCOPIC VAGINAL HYSTERECTOMY WITH SALPINGO OOPHORECTOMY Bilateral 04/12/2016   Procedure: LAPAROSCOPIC ASSISTED VAGINAL HYSTERECTOMY WITH SALPINGO OOPHORECTOMY;  Surgeon: Arvella Nigh, MD;  Location: [facility];  Service: Gynecology;  Laterality: Bilateral;  . REDUCTION MAMMAPLASTY Bilateral   . ROUX-EN-Y GASTRIC BYPASS  06/  2002  . TUBAL LIGATION  10/ 1990   Social History   Occupational History  . Occupation: [employer]  Tobacco Use  . Smoking status: Never Smoker  . Smokeless tobacco: Never Used  Substance and Sexual Activity  .  Alcohol use: No    Comment: drinks daily - none since 08/2012 per pt.  . Drug use: No  . Sexual activity: Yes    Birth control/protection: Surgical

## 2020-08-07 ENCOUNTER — Ambulatory Visit (INDEPENDENT_AMBULATORY_CARE_PROVIDER_SITE_OTHER): Payer: 59 | Admitting: Orthopaedic Surgery

## 2020-08-07 ENCOUNTER — Ambulatory Visit (INDEPENDENT_AMBULATORY_CARE_PROVIDER_SITE_OTHER): Payer: 59

## 2020-08-07 ENCOUNTER — Encounter: Payer: Self-pay | Admitting: Orthopaedic Surgery

## 2020-08-07 DIAGNOSIS — S52134A Nondisplaced fracture of neck of right radius, initial encounter for closed fracture: Secondary | ICD-10-CM

## 2020-08-07 MED ORDER — HYDROCODONE-ACETAMINOPHEN 5-325 MG PO TABS: 1.0000 | ORAL_TABLET | Freq: Every day | ORAL | 0 refills | Status: DC | PRN

## 2020-08-07 NOTE — Progress Notes (Signed)
Office Visit Note   Patient: Tammy [name]           Date of Birth: 1961/03/30           MRN: [id number] Visit Date: 08/07/2020              Requested by: [name], MD No address on file PCP: [name], MD   Assessment & Plan: Visit Diagnoses:  1. Closed nondisplaced fracture of neck of right radius, initial encounter     Plan: Impression is little over 2 weeks out right radial neck fracture.  The patient will remain nonweightbearing for another 2 weeks.  She will start gentle range of motion of the elbow.  Follow-up with [place] in 4 weeks time for repeat evaluation and 3 view x-rays of the right elbow to include a radial head view.  Follow-Up Instructions: Return in about 4 weeks (around 09/04/2020).   Orders:  Orders Placed This Encounter  Procedures  . XR Elbow Complete Right (3+View)   No orders of the defined types were placed in this encounter.     Procedures: No procedures performed   Clinical Data: No additional findings.   Subjective: Chief Complaint  Patient presents with  . Right Elbow - Pain, Follow-up    HPI patient is a pleasant 59 year old female who comes in today little over 2 weeks out right radial neck fracture.  She has been somewhat compliant wearing her sling.  No new injury.     Objective: Vital Signs: LMP 09/30/2012     Ortho Exam right elbow exam shows moderate tenderness to the fracture site.  She has increased pain with pronation and supination of the forearm.  She is neurovascular intact distally.  Specialty Comments:  No specialty comments available.  Imaging: XR Elbow Complete Right (3+View)  Result Date: 08/07/2020 X-rays reveal persistent fracture lucency to the radial neck with evidence of resorption.  The fracture is however still in appropriate alignment.    PMFS History: Patient Active Problem List   Diagnosis Date Noted  . Closed nondisplaced fracture of neck of right radius 07/21/2020  . Somnolence    . Aspiration pneumonia ([place]) 02/20/2020  . Lactic acidosis 02/20/2020  . Acute metabolic encephalopathy 49/17/9150  . Sepsis ([place]) 02/20/2020  . Chronic pain of both knees 01/08/2020  . Encephalopathy acute 10/27/2019  . Acute encephalopathy 10/27/2019  . CAP (community acquired pneumonia) 07/31/2019  . Atrial fibrillation with RVR ([place]) 07/31/2019  . Morbid obesity with BMI of 50.0-59.9, adult ([place]) 07/31/2019  . Respiratory failure ([place]) 04/14/2019  . Community acquired pneumonia 04/14/2019  . Closed nondisplaced fracture of head of left radius 06/20/2018  . Sprain of left wrist 06/20/2018  . Peroneal tendinitis, right leg 06/23/2017  . Trochanteric bursitis, right hip 06/23/2017  . Acute right-sided low back pain with left-sided sciatica 03/24/2017  . Pain in right hip 03/24/2017  . Impingement syndrome of left shoulder 08/30/2016  . Impingement syndrome of right shoulder 08/30/2016  . Plantar fasciitis, bilateral 08/30/2016  . Pelvic relaxation 04/13/2016    Class: Present on Admission  . S/P hysterectomy 04/12/2016  . Depression 11/16/2015  . Generalized anxiety disorder 11/16/2015  . Convulsion ([place]) 11/16/2015  . SUI (stress urinary incontinence, female) 09/26/2012   Past Medical History:  Diagnosis Date  . Anxiety   . Arthritis    neck and lower back - deg disc disease, fingers  . Depression   . Frequency of urination   . GERD (gastroesophageal  reflux disease)   . History of seizure per pt no seizure since    11-07-2015  in setting of stress and sleep deprivation  /  negative work-up by neurologist unknown idiology  . Hypothyroidism   . Injury of thumb, left    03-31-2016    . Pelvic relaxation   . SUI (stress urinary incontinence, female)     Family History  Problem Relation Age of Onset  . Colon polyps Mother   . Heart disease Father 58       CABG  . Breast cancer Maternal Aunt   . Stomach cancer Maternal Grandmother   . Diabetes Maternal Grandmother    . Heart disease Maternal Grandmother   . Stomach cancer Maternal Uncle   . Colon cancer Maternal Uncle        x3  . Diabetes Maternal Aunt        Type I  . Diabetes Maternal Uncle        x5 - Type II  . Heart disease Paternal Grandmother   . Esophageal cancer Neg Hx   . Gallbladder disease Neg Hx   . Seizures Neg Hx     Past Surgical History:  Procedure Laterality Date  . ANTERIOR AND POSTERIOR REPAIR  09/25/2012   Procedure: ANTERIOR (CYSTOCELE) AND POSTERIOR REPAIR (RECTOCELE);  Surgeon: Lahoma Crocker, MD;  Location: [facility];  Service: Gynecology;  Laterality: N/A;  . ANTERIOR AND POSTERIOR REPAIR WITH SACROSPINOUS FIXATION N/A 04/12/2016   Procedure: ANTERIOR AND POSTERIOR REPAIR WITH SACROSPINOUS LIGAMENT SUSPENSION, CYSTOSCOPY;  Surgeon: Arvella Nigh, MD;  Location: [facility];  Service: Gynecology;  Laterality: N/A;  . APPENDECTOMY  1987  . BREAST EXCISIONAL BIOPSY Left    No scar visable   . BREAST REDUCTION SURGERY  2000  aprrox  . ENDOMETRIAL ABLATION W/ NOVASURE  2011   in office  . EXCISION LEFT BREAST BX  12-07-1999   benign  . LAPAROSCOPIC CHOLECYSTECTOMY  02-09-2005  . LAPAROSCOPIC VAGINAL HYSTERECTOMY WITH SALPINGO OOPHORECTOMY Bilateral 04/12/2016   Procedure: LAPAROSCOPIC ASSISTED VAGINAL HYSTERECTOMY WITH SALPINGO OOPHORECTOMY;  Surgeon: Arvella Nigh, MD;  Location: [facility];  Service: Gynecology;  Laterality: Bilateral;  . REDUCTION MAMMAPLASTY Bilateral   . ROUX-EN-Y GASTRIC BYPASS  06/  2002  . TUBAL LIGATION  10/ 1990   Social History   Occupational History  . Occupation: [employer]  Tobacco Use  . Smoking status: Never Smoker  . Smokeless tobacco: Never Used  Substance and Sexual Activity  . Alcohol use: No    Comment: drinks daily - none since 08/2012 per pt.  . Drug use: No  . Sexual activity: Yes    Birth control/protection: Surgical

## 2020-08-30 ENCOUNTER — Other Ambulatory Visit (HOSPITAL_COMMUNITY): Payer: Self-pay | Admitting: Nurse Practitioner

## 2020-09-04 ENCOUNTER — Ambulatory Visit: Payer: 59 | Admitting: Orthopaedic Surgery

## 2020-09-09 ENCOUNTER — Ambulatory Visit (INDEPENDENT_AMBULATORY_CARE_PROVIDER_SITE_OTHER): Payer: 59

## 2020-09-09 ENCOUNTER — Other Ambulatory Visit: Payer: Self-pay

## 2020-09-09 ENCOUNTER — Ambulatory Visit (INDEPENDENT_AMBULATORY_CARE_PROVIDER_SITE_OTHER): Payer: 59 | Admitting: Orthopaedic Surgery

## 2020-09-09 ENCOUNTER — Encounter: Payer: Self-pay | Admitting: Orthopaedic Surgery

## 2020-09-09 DIAGNOSIS — S52134A Nondisplaced fracture of neck of right radius, initial encounter for closed fracture: Secondary | ICD-10-CM | POA: Diagnosis not present

## 2020-09-09 MED ORDER — TRAMADOL HCL 50 MG PO TABS: 50.0000 mg | ORAL_TABLET | Freq: Three times a day (TID) | ORAL | 0 refills | Status: DC | PRN

## 2020-09-09 NOTE — Progress Notes (Signed)
Office Visit Note   Patient: Tammy [name]           Date of Birth: 08/09/1961           MRN: [id number] Visit Date: 09/09/2020              Requested by: [name], MD No address on file PCP: [name], MD   Assessment & Plan: Visit Diagnoses:  1. Closed nondisplaced fracture of neck of right radius, initial encounter     Plan: Impression is 6 weeks status post right radial neck fracture.  We will start the patient in physical therapy to progress with range of motion.  She will limit weightbearing to 5 pounds.  She will follow up with [place] in 4 weeks time for repeat evaluation three-view x-rays of the right elbow to include radial head view.  Follow-Up Instructions: Return in about 4 weeks (around 10/07/2020).   Orders:  Orders Placed This Encounter  Procedures  . XR Elbow Complete Right (3+View)  . Ambulatory referral to Physical Therapy   Meds ordered this encounter  Medications  . traMADol (ULTRAM) 50 MG tablet    Sig: Take 1 tablet (50 mg total) by mouth 3 (three) times daily as needed.    Dispense:  30 tablet    Refill:  0      Procedures: No procedures performed   Clinical Data: No additional findings.   Subjective: Chief Complaint  Patient presents with  . Right Elbow - Pain    HPI patient is a pleasant 59 year old female who comes in today approximately 6 weeks out right radial neck fracture.  She has been doing well.  She still endorses some pain to the right elbow.  She has been working on range of motion.     Objective: Vital Signs: LMP 09/30/2012     Ortho Exam examination of the right elbow reveals mild to moderate tenderness at the fracture site.  She has near full flexion and extension.  She does have slight limitation with supination pronation.  She is neurovascular intact distally.  Specialty Comments:  No specialty comments available.  Imaging: XR Elbow Complete Right (3+View)  Result Date: 09/09/2020 X-rays demonstrate  moderate callus formation to the fracture site.  There is slight evidence of bony resorption towards the lateral portion of the fracture.    PMFS History: Patient Active Problem List   Diagnosis Date Noted  . Closed nondisplaced fracture of neck of right radius 07/21/2020  . Somnolence   . Aspiration pneumonia ([place]) 02/20/2020  . Lactic acidosis 02/20/2020  . Acute metabolic encephalopathy 13/05/6577  . Sepsis ([place]) 02/20/2020  . Chronic pain of both knees 01/08/2020  . Encephalopathy acute 10/27/2019  . Acute encephalopathy 10/27/2019  . CAP (community acquired pneumonia) 07/31/2019  . Atrial fibrillation with RVR ([place]) 07/31/2019  . Morbid obesity with BMI of 50.0-59.9, adult ([place]) 07/31/2019  . Respiratory failure ([place]) 04/14/2019  . Community acquired pneumonia 04/14/2019  . Closed nondisplaced fracture of head of left radius 06/20/2018  . Sprain of left wrist 06/20/2018  . Peroneal tendinitis, right leg 06/23/2017  . Trochanteric bursitis, right hip 06/23/2017  . Acute right-sided low back pain with left-sided sciatica 03/24/2017  . Pain in right hip 03/24/2017  . Impingement syndrome of left shoulder 08/30/2016  . Impingement syndrome of right shoulder 08/30/2016  . Plantar fasciitis, bilateral 08/30/2016  . Pelvic relaxation 04/13/2016    Class: Present on Admission  . S/P hysterectomy 04/12/2016  .  Depression 11/16/2015  . Generalized anxiety disorder 11/16/2015  . Convulsion ([place]) 11/16/2015  . SUI (stress urinary incontinence, female) 09/26/2012   Past Medical History:  Diagnosis Date  . Anxiety   . Arthritis    neck and lower back - deg disc disease, fingers  . Depression   . Frequency of urination   . GERD (gastroesophageal reflux disease)   . History of seizure per pt no seizure since    11-07-2015  in setting of stress and sleep deprivation  /  negative work-up by neurologist unknown idiology  . Hypothyroidism   . Injury of thumb, left    03-31-2016     . Pelvic relaxation   . SUI (stress urinary incontinence, female)     Family History  Problem Relation Age of Onset  . Colon polyps Mother   . Heart disease Father 68       CABG  . Breast cancer Maternal Aunt   . Stomach cancer Maternal Grandmother   . Diabetes Maternal Grandmother   . Heart disease Maternal Grandmother   . Stomach cancer Maternal Uncle   . Colon cancer Maternal Uncle        x3  . Diabetes Maternal Aunt        Type I  . Diabetes Maternal Uncle        x5 - Type II  . Heart disease Paternal Grandmother   . Esophageal cancer Neg Hx   . Gallbladder disease Neg Hx   . Seizures Neg Hx     Past Surgical History:  Procedure Laterality Date  . ANTERIOR AND POSTERIOR REPAIR  09/25/2012   Procedure: ANTERIOR (CYSTOCELE) AND POSTERIOR REPAIR (RECTOCELE);  Surgeon: Lahoma Crocker, MD;  Location: [facility];  Service: Gynecology;  Laterality: N/A;  . ANTERIOR AND POSTERIOR REPAIR WITH SACROSPINOUS FIXATION N/A 04/12/2016   Procedure: ANTERIOR AND POSTERIOR REPAIR WITH SACROSPINOUS LIGAMENT SUSPENSION, CYSTOSCOPY;  Surgeon: Arvella Nigh, MD;  Location: [facility];  Service: Gynecology;  Laterality: N/A;  . APPENDECTOMY  1987  . BREAST EXCISIONAL BIOPSY Left    No scar visable   . BREAST REDUCTION SURGERY  2000  aprrox  . ENDOMETRIAL ABLATION W/ NOVASURE  2011   in office  . EXCISION LEFT BREAST BX  12-07-1999   benign  . LAPAROSCOPIC CHOLECYSTECTOMY  02-09-2005  . LAPAROSCOPIC VAGINAL HYSTERECTOMY WITH SALPINGO OOPHORECTOMY Bilateral 04/12/2016   Procedure: LAPAROSCOPIC ASSISTED VAGINAL HYSTERECTOMY WITH SALPINGO OOPHORECTOMY;  Surgeon: Arvella Nigh, MD;  Location: [facility];  Service: Gynecology;  Laterality: Bilateral;  . REDUCTION MAMMAPLASTY Bilateral   . ROUX-EN-Y GASTRIC BYPASS  06/  2002  . TUBAL LIGATION  10/ 1990   Social History   Occupational History  . Occupation: [employer]  Tobacco Use  . Smoking status: Never Smoker  .  Smokeless tobacco: Never Used  Substance and Sexual Activity  . Alcohol use: No    Comment: drinks daily - none since 08/2012 per pt.  . Drug use: No  . Sexual activity: Yes    Birth control/protection: Surgical

## 2020-09-24 ENCOUNTER — Other Ambulatory Visit: Payer: Self-pay | Admitting: Cardiology

## 2020-09-28 ENCOUNTER — Other Ambulatory Visit (HOSPITAL_COMMUNITY): Payer: Self-pay | Admitting: Nurse Practitioner

## 2020-10-07 ENCOUNTER — Ambulatory Visit (INDEPENDENT_AMBULATORY_CARE_PROVIDER_SITE_OTHER): Payer: 59

## 2020-10-07 ENCOUNTER — Encounter: Payer: Self-pay | Admitting: Orthopaedic Surgery

## 2020-10-07 ENCOUNTER — Ambulatory Visit (INDEPENDENT_AMBULATORY_CARE_PROVIDER_SITE_OTHER): Payer: 59 | Admitting: Orthopaedic Surgery

## 2020-10-07 DIAGNOSIS — S52134A Nondisplaced fracture of neck of right radius, initial encounter for closed fracture: Secondary | ICD-10-CM

## 2020-10-07 MED ORDER — TRAMADOL HCL 50 MG PO TABS: 50.0000 mg | ORAL_TABLET | Freq: Three times a day (TID) | ORAL | 2 refills | Status: DC | PRN

## 2020-10-07 NOTE — Progress Notes (Signed)
Office Visit Note   Patient: ROMAINE MACIOLEK           Date of Birth: Mar 05, 1961           MRN: [id number] Visit Date: 10/07/2020              Requested by: Malka So., MD No address on file PCP: Malka So., MD   Assessment & Plan: Visit Diagnoses:  1. Closed nondisplaced fracture of neck of right radius, initial encounter     Plan: Impression is 10 weeks status post right radial neck fracture with bony resorption.  The patient is clinically doing well and has regained full range of motion.  At this point, she will limit any lift off with the right upper extremity and will avoid any lifting more than 10 pounds.  Follow-up with [place] in 6 weeks time for repeat evaluation and 3 view x-rays of the right elbow.  I have refilled her tramadol.  Follow-Up Instructions: Return in about 6 weeks (around 11/18/2020).   Orders:  Orders Placed This Encounter  Procedures  . XR Elbow Complete Right (3+View)   Meds ordered this encounter  Medications  . traMADol (ULTRAM) 50 MG tablet    Sig: Take 1 tablet (50 mg total) by mouth 3 (three) times daily as needed.    Dispense:  30 tablet    Refill:  2      Procedures: No procedures performed   Clinical Data: No additional findings.   Subjective: Chief Complaint  Patient presents with  . Right Elbow - Fracture, Follow-up    HPI patient is a pleasant 60 year old female who comes in today 10 weeks out right radial neck fracture.  She has been doing well.  She has some pain which is relieved with tramadol.  She has not yet been to physical therapy.     Objective: Vital Signs: LMP 09/30/2012     Ortho Exam right elbow exam reveals mild to moderate tenderness to the fracture site.  She has full elbow flexion, extension, supination pronation.  She is neurovascular intact distally.  Specialty Comments:  No specialty comments available.  Imaging: XR Elbow Complete Right (3+View)  Result Date: 10/07/2020 X-rays demonstrate  bony resorption to the radial neck fracture site    PMFS History: Patient Active Problem List   Diagnosis Date Noted  . Closed nondisplaced fracture of neck of right radius 07/21/2020  . Somnolence   . Aspiration pneumonia ([facility]) 02/20/2020  . Lactic acidosis 02/20/2020  . Acute metabolic encephalopathy 02/20/2020  . Sepsis ([facility]) 02/20/2020  . Chronic pain of both knees 01/08/2020  . Encephalopathy acute 10/27/2019  . Acute encephalopathy 10/27/2019  . CAP (community acquired pneumonia) 07/31/2019  . Atrial fibrillation with RVR ([facility]) 07/31/2019  . Morbid obesity with BMI of 50.0-59.9, adult ([facility]) 07/31/2019  . Respiratory failure ([facility]) 04/14/2019  . Community acquired pneumonia 04/14/2019  . Closed nondisplaced fracture of head of left radius 06/20/2018  . Sprain of left wrist 06/20/2018  . Peroneal tendinitis, right leg 06/23/2017  . Trochanteric bursitis, right hip 06/23/2017  . Acute right-sided low back pain with left-sided sciatica 03/24/2017  . Pain in right hip 03/24/2017  . Impingement syndrome of left shoulder 08/30/2016  . Impingement syndrome of right shoulder 08/30/2016  . Plantar fasciitis, bilateral 08/30/2016  . Pelvic relaxation 04/13/2016    Class: Present on Admission  . S/P hysterectomy 04/12/2016  . Depression 11/16/2015  . Generalized anxiety disorder 11/16/2015  . Convulsion ([facility])  11/16/2015  . SUI (stress urinary incontinence, female) 09/26/2012   Past Medical History:  Diagnosis Date  . Anxiety   . Arthritis    neck and lower back - deg disc disease, fingers  . Depression   . Frequency of urination   . GERD (gastroesophageal reflux disease)   . History of seizure per pt no seizure since    11-07-2015  in setting of stress and sleep deprivation  /  negative work-up by neurologist unknown idiology  . Hypothyroidism   . Injury of thumb, left    03-31-2016    . Pelvic relaxation   . SUI (stress urinary incontinence, female)     Family History   Problem Relation Age of Onset  . Colon polyps Mother   . Heart disease Father 64       CABG  . Breast cancer Maternal Aunt   . Stomach cancer Maternal Grandmother   . Diabetes Maternal Grandmother   . Heart disease Maternal Grandmother   . Stomach cancer Maternal Uncle   . Colon cancer Maternal Uncle        x3  . Diabetes Maternal Aunt        Type I  . Diabetes Maternal Uncle        x5 - Type II  . Heart disease Paternal Grandmother   . Esophageal cancer Neg Hx   . Gallbladder disease Neg Hx   . Seizures Neg Hx     Past Surgical History:  Procedure Laterality Date  . ANTERIOR AND POSTERIOR REPAIR  09/25/2012   Procedure: ANTERIOR (CYSTOCELE) AND POSTERIOR REPAIR (RECTOCELE);  Surgeon: Antionette Char, MD;  Location: [facility];  Service: Gynecology;  Laterality: N/A;  . ANTERIOR AND POSTERIOR REPAIR WITH SACROSPINOUS FIXATION N/A 04/12/2016   Procedure: ANTERIOR AND POSTERIOR REPAIR WITH SACROSPINOUS LIGAMENT SUSPENSION, CYSTOSCOPY;  Surgeon: Richardean Chimera, MD;  Location: [facility];  Service: Gynecology;  Laterality: N/A;  . APPENDECTOMY  1987  . BREAST EXCISIONAL BIOPSY Left    No scar visable   . BREAST REDUCTION SURGERY  2000  aprrox  . ENDOMETRIAL ABLATION W/ NOVASURE  2011   in office  . EXCISION LEFT BREAST BX  12-07-1999   benign  . LAPAROSCOPIC CHOLECYSTECTOMY  02-09-2005  . LAPAROSCOPIC VAGINAL HYSTERECTOMY WITH SALPINGO OOPHORECTOMY Bilateral 04/12/2016   Procedure: LAPAROSCOPIC ASSISTED VAGINAL HYSTERECTOMY WITH SALPINGO OOPHORECTOMY;  Surgeon: Richardean Chimera, MD;  Location: [facility];  Service: Gynecology;  Laterality: Bilateral;  . REDUCTION MAMMAPLASTY Bilateral   . ROUX-EN-Y GASTRIC BYPASS  06/  2002  . TUBAL LIGATION  10/ 1990   Social History   Occupational History  . Occupation: [employer]  Tobacco Use  . Smoking status: Never Smoker  . Smokeless tobacco: Never Used  Substance and Sexual Activity  . Alcohol use: No     Comment: drinks daily - none since 08/2012 per pt.  . Drug use: No  . Sexual activity: Yes    Birth control/protection: Surgical

## 2020-10-29 ENCOUNTER — Other Ambulatory Visit (HOSPITAL_COMMUNITY): Payer: Self-pay | Admitting: Nurse Practitioner

## 2020-11-18 ENCOUNTER — Ambulatory Visit: Payer: 59 | Admitting: Orthopaedic Surgery

## 2020-11-26 ENCOUNTER — Other Ambulatory Visit (HOSPITAL_COMMUNITY): Payer: Self-pay | Admitting: Nurse Practitioner

## 2020-12-10 ENCOUNTER — Telehealth: Payer: Self-pay | Admitting: *Deleted

## 2020-12-10 NOTE — Telephone Encounter (Signed)
Sleep studies and face to face notes faxed to [facility] per their request.

## 2020-12-22 ENCOUNTER — Encounter (HOSPITAL_COMMUNITY): Payer: Self-pay

## 2020-12-22 ENCOUNTER — Emergency Department (HOSPITAL_COMMUNITY): Payer: 59

## 2020-12-22 ENCOUNTER — Other Ambulatory Visit: Payer: Self-pay

## 2020-12-22 ENCOUNTER — Emergency Department (HOSPITAL_COMMUNITY)
Admission: EM | Admit: 2020-12-22 | Discharge: 2020-12-23 | Disposition: A | Discharge status: Home / Self Care | Payer: 59 | Attending: Emergency Medicine | Admitting: Emergency Medicine

## 2020-12-22 DIAGNOSIS — R4182 Altered mental status, unspecified: Secondary | ICD-10-CM | POA: Diagnosis present

## 2020-12-22 DIAGNOSIS — R739 Hyperglycemia, unspecified: Secondary | ICD-10-CM | POA: Insufficient documentation

## 2020-12-22 DIAGNOSIS — I4891 Unspecified atrial fibrillation: Secondary | ICD-10-CM | POA: Diagnosis not present

## 2020-12-22 DIAGNOSIS — R404 Transient alteration of awareness: Secondary | ICD-10-CM | POA: Insufficient documentation

## 2020-12-22 DIAGNOSIS — N3001 Acute cystitis with hematuria: Secondary | ICD-10-CM

## 2020-12-22 DIAGNOSIS — Z7901 Long term (current) use of anticoagulants: Secondary | ICD-10-CM | POA: Insufficient documentation

## 2020-12-22 DIAGNOSIS — Z79899 Other long term (current) drug therapy: Secondary | ICD-10-CM | POA: Diagnosis not present

## 2020-12-22 DIAGNOSIS — Z91040 Latex allergy status: Secondary | ICD-10-CM | POA: Insufficient documentation

## 2020-12-22 DIAGNOSIS — E039 Hypothyroidism, unspecified: Secondary | ICD-10-CM | POA: Insufficient documentation

## 2020-12-22 DIAGNOSIS — R748 Abnormal levels of other serum enzymes: Secondary | ICD-10-CM | POA: Diagnosis not present

## 2020-12-22 LAB — CBC WITH DIFFERENTIAL/PLATELET
Abs Immature Granulocytes: 0.03 10*3/uL (ref 0.00–0.07)
Basophils Absolute: 0 10*3/uL (ref 0.0–0.1)
Basophils Relative: 0 %
Eosinophils Absolute: 0 10*3/uL (ref 0.0–0.5)
Eosinophils Relative: 0 %
HCT: 35.2 % — ABNORMAL LOW (ref 36.0–46.0)
Hemoglobin: 10.6 g/dL — ABNORMAL LOW (ref 12.0–15.0)
Immature Granulocytes: 0 %
Lymphocytes Relative: 15 %
Lymphs Abs: 1.6 10*3/uL (ref 0.7–4.0)
MCH: 25.9 pg — ABNORMAL LOW (ref 26.0–34.0)
MCHC: 30.1 g/dL (ref 30.0–36.0)
MCV: 86.1 fL (ref 80.0–100.0)
Monocytes Absolute: 0.6 10*3/uL (ref 0.1–1.0)
Monocytes Relative: 6 %
Neutro Abs: 8.4 10*3/uL — ABNORMAL HIGH (ref 1.7–7.7)
Neutrophils Relative %: 79 %
Platelets: 307 10*3/uL (ref 150–400)
RBC: 4.09 MIL/uL (ref 3.87–5.11)
RDW: 15.8 % — ABNORMAL HIGH (ref 11.5–15.5)
WBC: 10.7 10*3/uL — ABNORMAL HIGH (ref 4.0–10.5)
nRBC: 0 % (ref 0.0–0.2)

## 2020-12-22 LAB — BLOOD GAS, VENOUS
Acid-Base Excess: 1.7 mmol/L (ref 0.0–2.0)
Bicarbonate: 26.6 mmol/L (ref 20.0–28.0)
O2 Saturation: 98.2 %
Patient temperature: 98.6
pCO2, Ven: 46 mmHg (ref 44.0–60.0)
pH, Ven: 7.381 (ref 7.250–7.430)
pO2, Ven: 173 mmHg — ABNORMAL HIGH (ref 32.0–45.0)

## 2020-12-22 LAB — I-STAT CHEM 8, ED
BUN: 8 mg/dL (ref 6–20)
Calcium, Ion: 1.1 mmol/L — ABNORMAL LOW (ref 1.15–1.40)
Chloride: 102 mmol/L (ref 98–111)
Creatinine, Ser: 0.8 mg/dL (ref 0.44–1.00)
Glucose, Bld: 104 mg/dL — ABNORMAL HIGH (ref 70–99)
HCT: 35 % — ABNORMAL LOW (ref 36.0–46.0)
Hemoglobin: 11.9 g/dL — ABNORMAL LOW (ref 12.0–15.0)
Potassium: 3.9 mmol/L (ref 3.5–5.1)
Sodium: 139 mmol/L (ref 135–145)
TCO2: 28 mmol/L (ref 22–32)

## 2020-12-22 LAB — BASIC METABOLIC PANEL
Anion gap: 10 (ref 5–15)
BUN: 9 mg/dL (ref 6–20)
CO2: 26 mmol/L (ref 22–32)
Calcium: 9 mg/dL (ref 8.9–10.3)
Chloride: 102 mmol/L (ref 98–111)
Creatinine, Ser: 0.86 mg/dL (ref 0.44–1.00)
GFR, Estimated: >60 mL/min (ref >60)
Glucose, Bld: 103 mg/dL — ABNORMAL HIGH (ref 70–99)
Potassium: 3.9 mmol/L (ref 3.5–5.1)
Sodium: 138 mmol/L (ref 135–145)

## 2020-12-22 LAB — CBG MONITORING, ED: Glucose-Capillary: 83 mg/dL (ref 70–99)

## 2020-12-22 LAB — CK: Total CK: 3122 U/L — ABNORMAL HIGH (ref 38–234)

## 2020-12-22 LAB — I-STAT BETA HCG BLOOD, ED (MC, WL, AP ONLY): I-stat hCG, quantitative: <5 m[IU]/mL (ref <5)

## 2020-12-22 LAB — AMMONIA: Ammonia: 24 umol/L (ref 9–35)

## 2020-12-22 LAB — ETHANOL: Alcohol, Ethyl (B): <10 mg/dL (ref <10)

## 2020-12-22 MED ORDER — LACTATED RINGERS IV BOLUS
2000.0000 mL | Freq: Once | INTRAVENOUS | Status: AC
  Administered 2020-12-22: 2000 mL via INTRAVENOUS

## 2020-12-22 NOTE — ED Triage Notes (Signed)
Pt arrives EMS from home with c/o n/v/d for 2-3 days. Husband returned at [place] and pt was found on the ground altered, slurred speech, hallucinations and saying random things.  CBG 489 in EMS with no hx of hyperglycemia.

## 2020-12-22 NOTE — ED Provider Notes (Signed)
[facility] DEPT Provider Note   CSN: [id number] Arrival date & time: 12/22/20  2035     History Chief Complaint  Patient presents with  . Hyperglycemia    Tammy [name] is a 60 y.o. female.  60 yo F with a chief complaint of altered mental status.  Was found down on the ground today by the husband today and confused.  Was feeling somewhat unwell earlier but has difficulty quantifying her symptoms.  Husband feels like maybe she is intoxicated or thought maybe she took more of her Klonopin than she was supposed to.  The patient is confused and unable to provide much history.  Level 5 caveat altered mental status  The history is provided by the patient.  Hyperglycemia Associated symptoms: no chest pain, no dizziness, no dysuria, no fever, no nausea, no shortness of breath and no vomiting   Illness Severity:  Moderate Onset quality:  Gradual Duration:  2 days Timing:  Constant Progression:  Worsening Chronicity:  New Associated symptoms: no chest pain, no congestion, no fever, no headaches, no myalgias, no nausea, no rhinorrhea, no shortness of breath, no vomiting and no wheezing        Past Medical History:  Diagnosis Date  . Anxiety   . Arthritis    neck and lower back - deg disc disease, fingers  . Depression   . Frequency of urination   . GERD (gastroesophageal reflux disease)   . History of seizure per pt no seizure since    11-07-2015  in setting of stress and sleep deprivation  /  negative work-up by neurologist unknown idiology  . Hypothyroidism   . Injury of thumb, left    03-31-2016    . Pelvic relaxation   . SUI (stress urinary incontinence, female)     Patient Active Problem List   Diagnosis Date Noted  . Closed nondisplaced fracture of neck of right radius 07/21/2020  . Somnolence   . Aspiration pneumonia ([place]) 02/20/2020  . Lactic acidosis 02/20/2020  . Acute metabolic encephalopathy 70/26/3785  . Sepsis ([place])  02/20/2020  . Chronic pain of both knees 01/08/2020  . Encephalopathy acute 10/27/2019  . Acute encephalopathy 10/27/2019  . CAP (community acquired pneumonia) 07/31/2019  . Atrial fibrillation with RVR ([place]) 07/31/2019  . Morbid obesity with BMI of 50.0-59.9, adult ([place]) 07/31/2019  . Respiratory failure ([place]) 04/14/2019  . Community acquired pneumonia 04/14/2019  . Closed nondisplaced fracture of head of left radius 06/20/2018  . Sprain of left wrist 06/20/2018  . Peroneal tendinitis, right leg 06/23/2017  . Trochanteric bursitis, right hip 06/23/2017  . Acute right-sided low back pain with left-sided sciatica 03/24/2017  . Pain in right hip 03/24/2017  . Impingement syndrome of left shoulder 08/30/2016  . Impingement syndrome of right shoulder 08/30/2016  . Plantar fasciitis, bilateral 08/30/2016  . Pelvic relaxation 04/13/2016    Class: Present on Admission  . S/P hysterectomy 04/12/2016  . Depression 11/16/2015  . Generalized anxiety disorder 11/16/2015  . Convulsion ([place]) 11/16/2015  . SUI (stress urinary incontinence, female) 09/26/2012    Past Surgical History:  Procedure Laterality Date  . ANTERIOR AND POSTERIOR REPAIR  09/25/2012   Procedure: ANTERIOR (CYSTOCELE) AND POSTERIOR REPAIR (RECTOCELE);  Surgeon: Lahoma Crocker, MD;  Location: [facility];  Service: Gynecology;  Laterality: N/A;  . ANTERIOR AND POSTERIOR REPAIR WITH SACROSPINOUS FIXATION N/A 04/12/2016   Procedure: ANTERIOR AND POSTERIOR REPAIR WITH SACROSPINOUS LIGAMENT SUSPENSION, CYSTOSCOPY;  Surgeon: Arvella Nigh, MD;  Location: [facility];  Service: Gynecology;  Laterality: N/A;  . APPENDECTOMY  1987  . BREAST EXCISIONAL BIOPSY Left    No scar visable   . BREAST REDUCTION SURGERY  2000  aprrox  . ENDOMETRIAL ABLATION W/ NOVASURE  2011   in office  . EXCISION LEFT BREAST BX  12-07-1999   benign  . LAPAROSCOPIC CHOLECYSTECTOMY  02-09-2005  . LAPAROSCOPIC VAGINAL HYSTERECTOMY WITH SALPINGO  OOPHORECTOMY Bilateral 04/12/2016   Procedure: LAPAROSCOPIC ASSISTED VAGINAL HYSTERECTOMY WITH SALPINGO OOPHORECTOMY;  Surgeon: Arvella Nigh, MD;  Location: [facility];  Service: Gynecology;  Laterality: Bilateral;  . REDUCTION MAMMAPLASTY Bilateral   . ROUX-EN-Y GASTRIC BYPASS  06/  2002  . TUBAL LIGATION  10/ 1990     OB History   No obstetric history on file.     Family History  Problem Relation Age of Onset  . Colon polyps Mother   . Heart disease Father 47       CABG  . Breast cancer Maternal Aunt   . Stomach cancer Maternal Grandmother   . Diabetes Maternal Grandmother   . Heart disease Maternal Grandmother   . Stomach cancer Maternal Uncle   . Colon cancer Maternal Uncle        x3  . Diabetes Maternal Aunt        Type I  . Diabetes Maternal Uncle        x5 - Type II  . Heart disease Paternal Grandmother   . Esophageal cancer Neg Hx   . Gallbladder disease Neg Hx   . Seizures Neg Hx     Social History   Tobacco Use  . Smoking status: Never Smoker  . Smokeless tobacco: Never Used  Substance Use Topics  . Alcohol use: No    Comment: drinks daily - none since 08/2012 per pt.  . Drug use: No    Home Medications Prior to Admission medications   Medication Sig Start Date End Date Taking? Authorizing Provider  apixaban (ELIQUIS) 5 MG TABS tablet Take 5 mg by mouth 2 (two) times daily.    [provider]  Ascorbic Acid (VITAMIN C) 1000 MG tablet Take 1,000 mg by mouth daily.    [provider]  buPROPion (WELLBUTRIN XL) 150 MG 24 hr tablet Take 150 mg by mouth in the morning and at bedtime.  12/17/19   [provider]  clonazePAM (KLONOPIN) 0.5 MG tablet Take 0.5 tablets (0.25 mg total) by mouth 3 (three) times daily as needed for anxiety. Patient not taking: Reported on 03/26/2020 10/28/19   Dana Allan I, MD  clonazePAM (KLONOPIN) 1 MG tablet Take 1 mg by mouth 3 (three) times daily as needed for anxiety.  03/24/20    [provider]  Diclofenac Sodium (PENNSAID) 2 % SOLN Apply 2 g topically 2 (two) times daily as needed (to affected area). Patient not taking: Reported on 03/26/2020 01/08/20   Leandrew Koyanagi, MD  diltiazem (CARDIZEM CD) 300 MG 24 hr capsule Take 1 capsule (300 mg total) by mouth daily. 09/04/19 03/26/20  Minus Breeding, MD  ELIQUIS 5 MG TABS tablet TAKE ONE TABLET BY MOUTH TWICE A DAY 09/24/20   Minus Breeding, MD  FLUoxetine (PROZAC) 20 MG capsule Take 20 mg by mouth at bedtime.  07/26/18   [provider]  furosemide (LASIX) 20 MG tablet TAKE ONE TABLET BY MOUTH TWICE A DAY 10/29/20   Sherran Needs, NP  gabapentin (NEURONTIN) 300 MG capsule Take 1 capsule (300  mg total) by mouth 2 (two) times daily. Patient not taking: Reported on 02/20/2020 10/28/19   Dana Allan I, MD  gabapentin (NEURONTIN) 600 MG tablet Take 600 mg by mouth 2 (two) times daily.     [provider]  HYDROcodone-acetaminophen (NORCO) 5-325 MG tablet Take 1-2 tablets by mouth daily as needed. 08/07/20   Aundra Dubin, PA-C  KLOR-CON M20 20 MEQ tablet TAKE ONE TABLET BY MOUTH DAILY 06/16/20   Sherran Needs, NP  metoprolol succinate (TOPROL-XL) 25 MG 24 hr tablet TAKE ONE TABLET BY MOUTH AT BEDTIME Patient taking differently: Take 25 mg by mouth at bedtime.  02/05/20   Sherran Needs, NP  OLANZapine (ZYPREXA) 10 MG tablet Take 10 mg by mouth at bedtime. 04/04/19   [provider]  traMADol (ULTRAM) 50 MG tablet Take 1 tablet (50 mg total) by mouth 3 (three) times daily as needed. 10/07/20   Aundra Dubin, PA-C    Allergies    Nsaids, Other, Adhesive [tape], Latex, and Sulfa antibiotics  Review of Systems   Review of Systems  Unable to perform ROS: Mental status change  Constitutional: Negative for chills and fever.  HENT: Negative for congestion and rhinorrhea.   Eyes: Negative for redness and visual disturbance.  Respiratory: Negative for shortness of breath and wheezing.    Cardiovascular: Negative for chest pain and palpitations.  Gastrointestinal: Negative for nausea and vomiting.  Genitourinary: Negative for dysuria and urgency.  Musculoskeletal: Negative for arthralgias and myalgias.  Skin: Negative for pallor and wound.  Neurological: Negative for dizziness and headaches.    Physical Exam Updated Vital Signs BP (!) 157/107   Pulse 90   Temp 98.5 F (36.9 C) (Oral)   Resp (!) 21   Wt 112 kg   LMP 09/30/2012   SpO2 95%   BMI 39.87 kg/m   Physical Exam Vitals and nursing note reviewed.  Constitutional:      General: She is not in acute distress.    Appearance: She is well-developed. She is not diaphoretic.  HENT:     Head: Normocephalic and atraumatic.  Eyes:     Pupils: Pupils are equal, round, and reactive to light.  Cardiovascular:     Rate and Rhythm: Normal rate and regular rhythm.     Heart sounds: No murmur heard. No friction rub. No gallop.   Pulmonary:     Effort: Pulmonary effort is normal.     Breath sounds: No wheezing or rales.  Abdominal:     General: There is no distension.     Palpations: Abdomen is soft.     Tenderness: There is no abdominal tenderness.  Musculoskeletal:        General: No tenderness.     Cervical back: Normal range of motion and neck supple.     Comments: No obvious tenderness to the LLE.  PMS intact distally.   Skin:    General: Skin is warm and dry.  Neurological:     Mental Status: She is alert.  Psychiatric:        Behavior: Behavior normal.     ED Results / Procedures / Treatments   Labs (all labs ordered are listed, but only abnormal results are displayed) Labs Reviewed  CBC WITH DIFFERENTIAL/PLATELET - Abnormal; Notable for the following components:      Result Value   WBC 10.7 (*)    Hemoglobin 10.6 (*)    HCT 35.2 (*)    MCH 25.9 (*)  RDW 15.8 (*)    Neutro Abs 8.4 (*)    All other components within normal limits  BASIC METABOLIC PANEL - Abnormal; Notable for the  following components:   Glucose, Bld 103 (*)    All other components within normal limits  BLOOD GAS, VENOUS - Abnormal; Notable for the following components:   pO2, Ven 173.0 (*)    All other components within normal limits  CK - Abnormal; Notable for the following components:   Total CK 3,122 (*)    All other components within normal limits  I-STAT CHEM 8, ED - Abnormal; Notable for the following components:   Glucose, Bld 104 (*)    Calcium, Ion 1.10 (*)    Hemoglobin 11.9 (*)    HCT 35.0 (*)    All other components within normal limits  CULTURE, BLOOD (ROUTINE X 2)  CULTURE, BLOOD (ROUTINE X 2)  ETHANOL  URINALYSIS, ROUTINE W REFLEX MICROSCOPIC  AMMONIA  LACTIC ACID, PLASMA  HEPATIC FUNCTION PANEL  RAPID URINE DRUG SCREEN, HOSP PERFORMED  CBG MONITORING, ED  I-STAT BETA HCG BLOOD, ED ([facility], [facility], [facility] ONLY)    EKG EKG Interpretation  Date/Time:  Monday December 22 2020 21:53:20 EDT Ventricular Rate:  86 PR Interval:    QRS Duration: 93 QT Interval:  380 QTC Calculation: 455 R Axis:   50 Text Interpretation: Sinus rhythm Abnormal R-wave progression, early transition Borderline repolarization abnormality No significant change since last tracing Confirmed by Deno Etienne [phone]) on 12/22/2020 9:55:23 PM   Radiology DG Chest 2 View  Result Date: 12/22/2020 CLINICAL DATA:  Nausea vomiting diarrhea EXAM: CHEST - 2 VIEW COMPARISON:  None. FINDINGS: The heart size and mediastinal contours are within normal limits. Both lungs are clear. The visualized skeletal structures are unremarkable. IMPRESSION: No active cardiopulmonary disease. Electronically Signed   By: Prudencio Pair M.D.   On: 12/22/2020 22:38   DG Tibia/Fibula Left  Result Date: 12/22/2020 CLINICAL DATA:  60 year old female with left knee pain. EXAM: LEFT TIBIA AND FIBULA - 2 VIEW COMPARISON:  Left knee radiograph dated 01/08/2020. FINDINGS: There is no acute fracture or dislocation. Mild arthritic changes of the knee. No  joint effusion. The soft tissues are unremarkable. IMPRESSION: No acute fracture or dislocation. Electronically Signed   By: Anner Crete M.D.   On: 12/22/2020 22:39   CT HEAD WO CONTRAST  Result Date: 12/22/2020 CLINICAL DATA:  60 year old female with delirium. EXAM: CT HEAD WITHOUT CONTRAST TECHNIQUE: Contiguous axial images were obtained from the base of the skull through the vertex without intravenous contrast. COMPARISON:  Head CT dated 03/26/2020. FINDINGS: Brain: The ventricles and sulci appropriate size for patient's age. The gray-white matter discrimination is preserved. There is no acute intracranial hemorrhage. No mass effect or midline shift. No extra-axial fluid collection. Vascular: No hyperdense vessel or unexpected calcification. Skull: Normal. Negative for fracture or focal lesion. Sinuses/Orbits: No acute finding. Other: None IMPRESSION: Unremarkable noncontrast CT of the brain. Electronically Signed   By: Anner Crete M.D.   On: 12/22/2020 22:31    Procedures Procedures  Procedure note: Ultrasound Guided Peripheral IV Ultrasound guided peripheral 1.88 inch angiocath IV placement performed by me. Indications: Nursing unable to place IV. Details: The antecubital fossa and upper arm were evaluated with a multifrequency linear probe. Patent brachial veins were noted. 1 attempt was made to cannulate a vein under realtime US guidance with successful cannulation of the vein and catheter placement. There is return of non-pulsatile dark red blood. The patient  tolerated the procedure well without complications. Images archived electronically.  CPT codes: [phone] and [phone]  Medications Ordered in ED Medications  lactated ringers bolus 2,000 mL (2,000 mLs Intravenous New Bag/Given 12/22/20 2329)    ED Course  I have reviewed the triage vital signs and the nursing notes.  Pertinent labs & imaging results that were available during my care of the patient were reviewed by me and  considered in my medical decision making (see chart for details).    MDM Rules/Calculators/A&P                          60 yo F with a chief complaint of altered mental status.  Patient was found on the ground altered by her husband.  He thought maybe she was intoxicated.  EMS was concerned for hyperglycemia had a blood sugar apparently in route of over 400.  No history of diabetes.  Blood sugar here on recheck is normal.  We will give a bolus of IV fluids.  Lab work.  CT head chest x-ray UA.  Reassess.   CT scan of the head negative chest x-ray reviewed by me without focal infiltrate or pneumothorax.  CK mildly elevated at 3000.  On reassessment the patient is much more alert and oriented.  I discussed with the patient and family on the phone.  She apparently was very altered when he came home after leaving her little bit after lunch.  She is much more with it now asking me for more benzodiazepines.  Plan to wait for her ua says see if it is infected.  I think if she continues to have improvement she can be discharged home.  Signed out to Dr. Leonette Monarch.  Please see his note for further details care in the ED.  The patients results and plan were reviewed and discussed.   Any x-rays performed were independently reviewed by myself.   Differential diagnosis were considered with the presenting HPI.  Medications  lactated ringers bolus 2,000 mL (2,000 mLs Intravenous New Bag/Given 12/22/20 2329)    Vitals:   12/22/20 2042 12/22/20 2151 12/22/20 2245 12/22/20 2300  BP: (!) 153/83 (!) 143/72  (!) 157/107  Pulse: 87 90 88 90  Resp: 20 17 17  (!) 21  Temp: 99.3 F (37.4 C) 98.5 F (36.9 C)    TempSrc: Oral Oral    SpO2: 97% 100%  95%  Weight: 112 kg       Final diagnoses:  Transient alteration of awareness  Elevated CK    Admission/ observation were discussed with the admitting physician, patient and/or family and they are comfortable with the plan.   Final Clinical Impression(s) / ED  Diagnoses Final diagnoses:  Transient alteration of awareness  Elevated CK    Rx / DC Orders ED Discharge Orders    None       Deno Etienne, DO 12/22/20 2331

## 2020-12-23 LAB — HEPATIC FUNCTION PANEL
ALT: 22 U/L (ref 0–44)
AST: 69 U/L — ABNORMAL HIGH (ref 15–41)
Albumin: 3.7 g/dL (ref 3.5–5.0)
Alkaline Phosphatase: 84 U/L (ref 38–126)
Bilirubin, Direct: 0.2 mg/dL (ref 0.0–0.2)
Indirect Bilirubin: 0.7 mg/dL (ref 0.3–0.9)
Total Bilirubin: 0.9 mg/dL (ref 0.3–1.2)
Total Protein: 6.9 g/dL (ref 6.5–8.1)

## 2020-12-23 LAB — URINALYSIS, ROUTINE W REFLEX MICROSCOPIC
Bilirubin Urine: NEGATIVE
Glucose, UA: NEGATIVE mg/dL
Ketones, ur: NEGATIVE mg/dL
Nitrite: POSITIVE — AB
Protein, ur: NEGATIVE mg/dL
Specific Gravity, Urine: 1.006 (ref 1.005–1.030)
pH: 6 (ref 5.0–8.0)

## 2020-12-23 LAB — RAPID URINE DRUG SCREEN, HOSP PERFORMED
Amphetamines: NOT DETECTED
Barbiturates: NOT DETECTED
Benzodiazepines: NOT DETECTED
Cocaine: NOT DETECTED
Opiates: POSITIVE — AB
Tetrahydrocannabinol: NOT DETECTED

## 2020-12-23 LAB — LACTIC ACID, PLASMA
Lactic Acid, Venous: 3.1 mmol/L (ref 0.5–1.9)
Lactic Acid, Venous: 1 mmol/L (ref 0.5–1.9)

## 2020-12-23 LAB — CK: Total CK: 825 U/L — ABNORMAL HIGH (ref 38–234)

## 2020-12-23 MED ORDER — SODIUM CHLORIDE 0.9 % IV SOLN
1.0000 g | Freq: Once | INTRAVENOUS | Status: AC
  Administered 2020-12-23: 1 g via INTRAVENOUS
  Filled 2020-12-23: qty 10

## 2020-12-23 MED ORDER — SODIUM CHLORIDE 0.9 % IV BOLUS
1000.0000 mL | Freq: Once | INTRAVENOUS | Status: AC
  Administered 2020-12-23: 1000 mL via INTRAVENOUS

## 2020-12-23 MED ORDER — CEPHALEXIN 500 MG PO CAPS: 500.0000 mg | ORAL_CAPSULE | Freq: Three times a day (TID) | ORAL | 0 refills | Status: AC

## 2020-12-23 NOTE — ED Notes (Signed)
Patient placed on pure wick, states she feels like she has to pee and is going to try.

## 2020-12-23 NOTE — ED Provider Notes (Signed)
I assumed care of this patient.  Please see previous provider note for further details of Hx, PE.  Briefly patient is a 60 y.o. female who presented after being found unresponsive by husband.  Likely intoxication versus unintentional overdose on benzodiazepines.  Initial concern for hyperglycemia however here her CBG was within normal limits.  After IV fluids the patient's had been responding well.  Screening labs notable for mild leukocytosis.  Patient also had elevated CK levels.no significant electrolyte derangements and renal sufficiency.  Currently pending lactic acid and UA.  Plan to follow-up on rest of the work-up and reassess.  Lactic acid 3.1 however this is while patient was receiving lactic Ringer's.  Will provide with normal saline and recheck.  That time we will also recheck her CK level to see if it is up or down trending.  UA notable for urinary tract infection.  UDS negative for benzodiazepines but positive for opiates.  Patient is alert and oriented.  Tolerating p.o. intake. Provided with Rocephin for urinary tract infection.   Lactic acid and CK cleared.  The patient appears reasonably screened and/or stabilized for discharge and I doubt any other medical condition or other [facility] requiring further screening, evaluation, or treatment in the ED at this time prior to discharge. Safe for discharge with strict return precautions.  Disposition: Discharge  Condition: Good  I have discussed the results, Dx and Tx plan with the patient/family who expressed understanding and agree(s) with the plan. Discharge instructions discussed at length. The patient/family was given strict return precautions who verbalized understanding of the instructions. No further questions at time of discharge.    ED Discharge Orders         Ordered    cephALEXin (KEFLEX) 500 MG capsule  3 times daily        12/23/20 0457          Follow Up: [name], MD  Call  to schedule an appointment  for close follow up      Fatima Blank, MD 12/23/20 [phone]

## 2020-12-28 LAB — CULTURE, BLOOD (ROUTINE X 2)
Culture: NO GROWTH
Culture: NO GROWTH

## 2021-01-10 ENCOUNTER — Other Ambulatory Visit (HOSPITAL_COMMUNITY): Payer: Self-pay | Admitting: Nurse Practitioner

## 2021-01-11 ENCOUNTER — Other Ambulatory Visit (HOSPITAL_COMMUNITY): Payer: Self-pay | Admitting: Nurse Practitioner

## 2021-02-04 ENCOUNTER — Encounter (HOSPITAL_COMMUNITY): Payer: Self-pay

## 2021-02-04 ENCOUNTER — Ambulatory Visit (INDEPENDENT_AMBULATORY_CARE_PROVIDER_SITE_OTHER): Payer: 59

## 2021-02-04 ENCOUNTER — Ambulatory Visit (HOSPITAL_COMMUNITY)
Admission: EM | Admit: 2021-02-04 | Discharge: 2021-02-04 | Disposition: A | Discharge status: Home / Self Care | Payer: 59 | Attending: Emergency Medicine | Admitting: Emergency Medicine

## 2021-02-04 DIAGNOSIS — M25532 Pain in left wrist: Secondary | ICD-10-CM

## 2021-02-04 DIAGNOSIS — W19XXXA Unspecified fall, initial encounter: Secondary | ICD-10-CM

## 2021-02-04 DIAGNOSIS — S52502A Unspecified fracture of the lower end of left radius, initial encounter for closed fracture: Secondary | ICD-10-CM

## 2021-02-04 MED ORDER — HYDROCODONE-ACETAMINOPHEN 5-325 MG PO TABS: 1.0000 | ORAL_TABLET | ORAL | 0 refills | Status: DC | PRN

## 2021-02-04 MED ORDER — HYDROCODONE-ACETAMINOPHEN 5-325 MG PO TABS
ORAL_TABLET | ORAL | Status: AC
  Filled 2021-02-04: qty 1

## 2021-02-04 MED ORDER — HYDROCODONE-ACETAMINOPHEN 5-325 MG PO TABS
1.0000 | ORAL_TABLET | Freq: Once | ORAL | Status: AC
Start: 2021-02-04 — End: 2021-02-04
  Administered 2021-02-04: 1 via ORAL

## 2021-02-04 NOTE — Progress Notes (Signed)
Orthopedic Tech Progress Note Patient Details:  Tammy [name] 08/12/1961 [id number]  Ortho Devices Type of Ortho Device: Sugartong splint,Arm sling Ortho Device/Splint Location: LUE Ortho Device/Splint Interventions: Ordered,Application,Adjustment   Post Interventions Patient Tolerated: Well Instructions Provided: [facility] [place] 02/04/2021, 4:49 PM

## 2021-02-04 NOTE — ED Provider Notes (Signed)
Tammy [name]    CSN: [id number] Arrival date & time: 02/04/21  1315      History   Chief Complaint Chief Complaint  Patient presents with  . Fall  . Wrist Pain    HPI Tammy [name] is a 60 y.o. female.   Patient here for evaluation of left hand and wrist pain and swelling.  Reports falling on flexed wrist yesterday.  Now having significant swelling and pain as well as some numbness and tingling in her fingertips.  Capillary refill and pulses intact.  Patient has taken Tylenol and iced wrist for pain.  Reports pain as constant and achy.  Limited ROM due to pain.   Denies any fevers, chest pain, shortness of breath, N/V/D, numbness, tingling, weakness, abdominal pain, or headaches.   ROS: As per HPI, all other pertinent ROS negative   The history is provided by the patient.  Fall  Wrist Pain    Past Medical History:  Diagnosis Date  . Anxiety   . Arthritis    neck and lower back - deg disc disease, fingers  . Depression   . Frequency of urination   . GERD (gastroesophageal reflux disease)   . History of seizure per pt no seizure since    11-07-2015  in setting of stress and sleep deprivation  /  negative work-up by neurologist unknown idiology  . Hypothyroidism   . Injury of thumb, left    03-31-2016    . Pelvic relaxation   . SUI (stress urinary incontinence, female)     Patient Active Problem List   Diagnosis Date Noted  . Closed nondisplaced fracture of neck of right radius 07/21/2020  . Somnolence   . Aspiration pneumonia ([place]) 02/20/2020  . Lactic acidosis 02/20/2020  . Acute metabolic encephalopathy 56/38/7564  . Sepsis ([place]) 02/20/2020  . Chronic pain of both knees 01/08/2020  . Encephalopathy acute 10/27/2019  . Acute encephalopathy 10/27/2019  . CAP (community acquired pneumonia) 07/31/2019  . Atrial fibrillation with RVR ([place]) 07/31/2019  . Morbid obesity with BMI of 50.0-59.9, adult ([place]) 07/31/2019  . Respiratory failure ([place])  04/14/2019  . Community acquired pneumonia 04/14/2019  . Closed nondisplaced fracture of head of left radius 06/20/2018  . Sprain of left wrist 06/20/2018  . Peroneal tendinitis, right leg 06/23/2017  . Trochanteric bursitis, right hip 06/23/2017  . Acute right-sided low back pain with left-sided sciatica 03/24/2017  . Pain in right hip 03/24/2017  . Impingement syndrome of left shoulder 08/30/2016  . Impingement syndrome of right shoulder 08/30/2016  . Plantar fasciitis, bilateral 08/30/2016  . Pelvic relaxation 04/13/2016    Class: Present on Admission  . S/P hysterectomy 04/12/2016  . Depression 11/16/2015  . Generalized anxiety disorder 11/16/2015  . Convulsion ([place]) 11/16/2015  . SUI (stress urinary incontinence, female) 09/26/2012    Past Surgical History:  Procedure Laterality Date  . ANTERIOR AND POSTERIOR REPAIR  09/25/2012   Procedure: ANTERIOR (CYSTOCELE) AND POSTERIOR REPAIR (RECTOCELE);  Surgeon: Lahoma Crocker, MD;  Location: [facility];  Service: Gynecology;  Laterality: N/A;  . ANTERIOR AND POSTERIOR REPAIR WITH SACROSPINOUS FIXATION N/A 04/12/2016   Procedure: ANTERIOR AND POSTERIOR REPAIR WITH SACROSPINOUS LIGAMENT SUSPENSION, CYSTOSCOPY;  Surgeon: Arvella Nigh, MD;  Location: [facility];  Service: Gynecology;  Laterality: N/A;  . APPENDECTOMY  1987  . BREAST EXCISIONAL BIOPSY Left    No scar visable   . BREAST REDUCTION SURGERY  2000  aprrox  . ENDOMETRIAL ABLATION W/ NOVASURE  2011  in office  . EXCISION LEFT BREAST BX  12-07-1999   benign  . LAPAROSCOPIC CHOLECYSTECTOMY  02-09-2005  . LAPAROSCOPIC VAGINAL HYSTERECTOMY WITH SALPINGO OOPHORECTOMY Bilateral 04/12/2016   Procedure: LAPAROSCOPIC ASSISTED VAGINAL HYSTERECTOMY WITH SALPINGO OOPHORECTOMY;  Surgeon: Arvella Nigh, MD;  Location: [facility];  Service: Gynecology;  Laterality: Bilateral;  . REDUCTION MAMMAPLASTY Bilateral   . ROUX-EN-Y GASTRIC BYPASS  06/  2002  . TUBAL  LIGATION  10/ 1990    OB History   No obstetric history on file.      Home Medications    Prior to Admission medications   Medication Sig Start Date End Date Taking? Authorizing Provider  HYDROcodone-acetaminophen (NORCO/VICODIN) 5-325 MG tablet Take 1-2 tablets by mouth every 4 (four) hours as needed for up to 5 days. 02/04/21 02/09/21 Yes Pearson Forster, NP  apixaban (ELIQUIS) 5 MG TABS tablet Take 5 mg by mouth 2 (two) times daily.    [provider]  Ascorbic Acid (VITAMIN C) 1000 MG tablet Take 1,000 mg by mouth daily.    [provider]  buPROPion (WELLBUTRIN XL) 150 MG 24 hr tablet Take 150 mg by mouth in the morning and at bedtime.  12/17/19   [provider]  clonazePAM (KLONOPIN) 0.5 MG tablet Take 0.5 tablets (0.25 mg total) by mouth 3 (three) times daily as needed for anxiety. Patient not taking: Reported on 03/26/2020 10/28/19   Dana Allan I, MD  clonazePAM (KLONOPIN) 1 MG tablet Take 1 mg by mouth 3 (three) times daily as needed for anxiety.  03/24/20   [provider]  Diclofenac Sodium (PENNSAID) 2 % SOLN Apply 2 g topically 2 (two) times daily as needed (to affected area). Patient not taking: Reported on 03/26/2020 01/08/20   Leandrew Koyanagi, MD  diltiazem (CARDIZEM CD) 300 MG 24 hr capsule Take 1 capsule (300 mg total) by mouth daily. 09/04/19 03/26/20  Minus Breeding, MD  ELIQUIS 5 MG TABS tablet TAKE ONE TABLET BY MOUTH TWICE A DAY 09/24/20   Minus Breeding, MD  FLUoxetine (PROZAC) 20 MG capsule Take 20 mg by mouth at bedtime.  07/26/18   [provider]  furosemide (LASIX) 20 MG tablet TAKE ONE TABLET BY MOUTH TWICE A DAY 10/29/20   Sherran Needs, NP  gabapentin (NEURONTIN) 300 MG capsule Take 1 capsule (300 mg total) by mouth 2 (two) times daily. Patient not taking: Reported on 02/20/2020 10/28/19   Dana Allan I, MD  gabapentin (NEURONTIN) 600 MG tablet Take 600 mg by mouth 2 (two) times daily.     [provider]  KLOR-CON M20 20 MEQ tablet TAKE ONE TABLET BY MOUTH DAILY 01/12/21   Sherran Needs, NP  metoprolol succinate (TOPROL-XL) 25 MG 24 hr tablet TAKE ONE TABLET BY MOUTH AT BEDTIME 01/12/21   Sherran Needs, NP  OLANZapine (ZYPREXA) 10 MG tablet Take 10 mg by mouth at bedtime. 04/04/19   [provider]  traMADol (ULTRAM) 50 MG tablet Take 1 tablet (50 mg total) by mouth 3 (three) times daily as needed. 10/07/20   Aundra Dubin, PA-C    Family History Family History  Problem Relation Age of Onset  . Colon polyps Mother   . Heart disease Father 66       CABG  . Breast cancer Maternal Aunt   . Stomach cancer Maternal Grandmother   . Diabetes Maternal Grandmother   . Heart disease Maternal Grandmother   . Stomach cancer Maternal Uncle   .  Colon cancer Maternal Uncle        x3  . Diabetes Maternal Aunt        Type I  . Diabetes Maternal Uncle        x5 - Type II  . Heart disease Paternal Grandmother   . Esophageal cancer Neg Hx   . Gallbladder disease Neg Hx   . Seizures Neg Hx     Social History Social History   Tobacco Use  . Smoking status: Never Smoker  . Smokeless tobacco: Never Used  Substance Use Topics  . Alcohol use: No    Comment: drinks daily - none since 08/2012 per pt.  . Drug use: No     Allergies   Nsaids, Other, Adhesive [tape], Latex, and Sulfa antibiotics   Review of Systems Review of Systems  Musculoskeletal: Positive for joint swelling.  Neurological: Positive for numbness.  All other systems reviewed and are negative.    Physical Exam Triage Vital Signs ED Triage Vitals  Enc Vitals Group     BP 02/04/21 1459 (!) 153/78     Pulse Rate 02/04/21 1456 82     Resp 02/04/21 1456 20     Temp 02/04/21 1456 98.4 F (36.9 C)     Temp src --      SpO2 02/04/21 1456 97 %     Weight --      Height --      Head Circumference --      Peak Flow --      Pain Score 02/04/21 1455 10     Pain Loc --      Pain Edu? --      Excl. in [place]? --     No data found.  Updated Vital Signs BP (!) 153/78   Pulse 82   Temp 98.4 F (36.9 C)   Resp 20   LMP 09/30/2012   SpO2 97%   Visual Acuity Right Eye Distance:   Left Eye Distance:   Bilateral Distance:    Right Eye Near:   Left Eye Near:    Bilateral Near:     Physical Exam Vitals and nursing note reviewed.  Constitutional:      General: She is not in acute distress.    Appearance: Normal appearance. She is not ill-appearing, toxic-appearing or diaphoretic.  HENT:     Head: Normocephalic and atraumatic.  Eyes:     Conjunctiva/sclera: Conjunctivae normal.  Cardiovascular:     Rate and Rhythm: Normal rate.     Pulses: Normal pulses.  Pulmonary:     Effort: Pulmonary effort is normal.  Abdominal:     General: Abdomen is flat.  Musculoskeletal:     Left wrist: Swelling, tenderness and bony tenderness present. No crepitus. Decreased range of motion. Normal pulse.     Cervical back: Normal range of motion.  Skin:    General: Skin is warm and dry.  Neurological:     General: No focal deficit present.     Mental Status: She is alert and oriented to person, place, and time.  Psychiatric:        Mood and Affect: Mood normal.      UC Treatments / Results  Labs (all labs ordered are listed, but only abnormal results are displayed) Labs Reviewed - No data to display  EKG   Radiology DG Wrist Complete Left  Result Date: 02/04/2021 CLINICAL DATA:  Pain following fall EXAM: LEFT WRIST - COMPLETE 3+ VIEW COMPARISON:  June 30, 2018 FINDINGS: Frontal, oblique, and lateral views were obtained. There is a fracture of the distal radial metaphysis with dorsal angulation distally and mild impaction at the fracture site. There is avulsion of the ulnar styloid. No other fractures are evident. No dislocation. There is narrowing of the first carpal-metacarpal joint. Calcifications are noted adjacent to the hamate bone, likely of arthropathic etiology. IMPRESSION: Fracture of  the distal radial metaphysis with dorsal angulation distally and mild impaction at the fracture site. Avulsion ulnar styloid. No dislocation. Arthropathy first carpal-metacarpal joint. Question a degree of synovial chondromatosis adjacent to the hamate bone. These results will be called to the ordering clinician or representative by the Radiologist Assistant, and communication documented in the PACS or [employer]. Electronically Signed   By: Lowella Grip III M.D.   On: 02/04/2021 16:04    Procedures Procedures (including critical care time)  Medications Ordered in UC Medications  HYDROcodone-acetaminophen (NORCO/VICODIN) 5-325 MG per tablet 1 tablet (1 tablet Oral Given 02/04/21 1636)    Initial Impression / Assessment and Plan / UC Course  I have reviewed the triage vital signs and the nursing notes.  Pertinent labs & imaging results that were available during my care of the patient were reviewed by me and considered in my medical decision making (see chart for details).    Distal radial fracture. X-ray shows fracture of the distal radial metaphysis with dorsal angulation distally and mid impaction at the fracture site.  Patient to follow-up with Dr. Claudia Desanctis for evaluation and possible surgery.  Patient given Norco in office for pain.  Sugar-tong splint and sling applied.  Encourage patient to rest.  May use ice for comfort.  Norco prescribed as needed.  Instructed patient not to take Norco while driving as it can make her sleepy.  Also instructed patient not to take any additional Tylenol products but may take naproxen or ibuprofen for breakthrough pain.   Final Clinical Impressions(s) / UC Diagnoses   Final diagnoses:  Closed fracture of distal end of left radius, unspecified fracture morphology, initial encounter     Discharge Instructions     Follow up with Dr. Keane Scrape office for evaluation of wrist fracture.    You can take the Norco as needed for pain.  It can make you  sleepy, so don't take it before driving.  You can take Ibuprofen or Naproxen for break through pain.    Rest, ice for 10-15 minutes every 4-6 hours.      ED Prescriptions    Medication Sig Dispense Auth. Provider   HYDROcodone-acetaminophen (NORCO/VICODIN) 5-325 MG tablet Take 1-2 tablets by mouth every 4 (four) hours as needed for up to 5 days. 15 tablet Pearson Forster, NP     I have reviewed the PDMP during this encounter.   Pearson Forster, NP 02/04/21 1650

## 2021-02-04 NOTE — ED Triage Notes (Signed)
Pt in with c/o left wrist pain after fall last night  States she is having numbness and tingling in her hand  Pt has iced her wrist and taken tylenol

## 2021-02-04 NOTE — Discharge Instructions (Addendum)
Follow up with Dr. Keane Scrape office for evaluation of wrist fracture.    You can take the Norco as needed for pain.  It can make you sleepy, so don't take it before driving.  You can take Ibuprofen or Naproxen for break through pain.    Rest, ice for 10-15 minutes every 4-6 hours.

## 2021-02-05 ENCOUNTER — Telehealth: Payer: Self-pay | Admitting: Plastic Surgery

## 2021-02-05 ENCOUNTER — Telehealth: Payer: Self-pay | Admitting: *Deleted

## 2021-02-05 ENCOUNTER — Other Ambulatory Visit (HOSPITAL_COMMUNITY)
Admission: RE | Admit: 2021-02-05 | Discharge: 2021-02-05 | Disposition: A | Discharge status: Home / Self Care | Payer: 59 | Source: Ambulatory Visit | Attending: Plastic Surgery | Admitting: Plastic Surgery

## 2021-02-05 DIAGNOSIS — Z01812 Encounter for preprocedural laboratory examination: Secondary | ICD-10-CM | POA: Diagnosis present

## 2021-02-05 DIAGNOSIS — Z20822 Contact with and (suspected) exposure to covid-19: Secondary | ICD-10-CM | POA: Diagnosis not present

## 2021-02-05 NOTE — Progress Notes (Signed)
Cardiology Office Note   Date:  02/06/2021   ID:  PANG ROBERS, DOB [birth year], MRN [id number]  PCP:  Fanny Bien, MD  Cardiologist:   Minus Breeding, MD   Chief Complaint  Patient presents with  . Pre-op Exam      History of Present Illness: Tammy [name] is a 60 y.o. female with a history of atrial fibrillation.  I have not seen her since she was in the hospital in 2020.  She is preop for repair of a radial fracture.  She has been followed in the atrial fibrillation clinic.    Her atrial fibrillation initially followed an episode of community-acquired pneumonia.  She was found to have atrial flutter on outpatient monitoring.  She has been diagnosed and treated for obstructive sleep apnea since then.  She is been managed with anticoagulation.    She fell and injured her arm.  She is due to have surgery on Monday.  She is does not feel her atrial fibrillation and would know that she is in it.  She is in sinus rhythm now.  She only had about 7% of the time that she was in fibrillation.  The patient denies any new symptoms such as chest discomfort, neck or arm discomfort. There has been no new shortness of breath, PND or orthopnea. There have been no reported palpitations, presyncope or syncope.    Past Medical History:  Diagnosis Date  . Anxiety   . Arthritis    neck and lower back - deg disc disease, fingers  . Depression   . Frequency of urination   . GERD (gastroesophageal reflux disease)   . History of seizure per pt no seizure since    11-07-2015  in setting of stress and sleep deprivation  /  negative work-up by neurologist unknown idiology  . Hypothyroidism   . Injury of thumb, left    03-31-2016    . Pelvic relaxation   . SUI (stress urinary incontinence, female)     Past Surgical History:  Procedure Laterality Date  . ANTERIOR AND POSTERIOR REPAIR  09/25/2012   Procedure: ANTERIOR (CYSTOCELE) AND POSTERIOR REPAIR (RECTOCELE);  Surgeon: Lahoma Crocker, MD;  Location: [facility];  Service: Gynecology;  Laterality: N/A;  . ANTERIOR AND POSTERIOR REPAIR WITH SACROSPINOUS FIXATION N/A 04/12/2016   Procedure: ANTERIOR AND POSTERIOR REPAIR WITH SACROSPINOUS LIGAMENT SUSPENSION, CYSTOSCOPY;  Surgeon: Arvella Nigh, MD;  Location: [facility];  Service: Gynecology;  Laterality: N/A;  . APPENDECTOMY  1987  . BREAST EXCISIONAL BIOPSY Left    No scar visable   . BREAST REDUCTION SURGERY  2000  aprrox  . ENDOMETRIAL ABLATION W/ NOVASURE  2011   in office  . EXCISION LEFT BREAST BX  12-07-1999   benign  . LAPAROSCOPIC CHOLECYSTECTOMY  02-09-2005  . LAPAROSCOPIC VAGINAL HYSTERECTOMY WITH SALPINGO OOPHORECTOMY Bilateral 04/12/2016   Procedure: LAPAROSCOPIC ASSISTED VAGINAL HYSTERECTOMY WITH SALPINGO OOPHORECTOMY;  Surgeon: Arvella Nigh, MD;  Location: [facility];  Service: Gynecology;  Laterality: Bilateral;  . REDUCTION MAMMAPLASTY Bilateral   . ROUX-EN-Y GASTRIC BYPASS  06/  2002  . TUBAL LIGATION  10/ 1990     Current Outpatient Medications  Medication Sig Dispense Refill  . buPROPion (WELLBUTRIN XL) 150 MG 24 hr tablet Take 450 mg by mouth daily.    . clonazePAM (KLONOPIN) 1 MG tablet Take 1 mg by mouth 3 (three) times daily as needed for anxiety.     Marland Kitchen ELIQUIS 5 MG  TABS tablet TAKE ONE TABLET BY MOUTH TWICE A DAY (Patient taking differently: Take 5 mg by mouth 2 (two) times daily.) 180 tablet 1  . FLUoxetine (PROZAC) 20 MG capsule Take 20 mg by mouth at bedtime.   11  . gabapentin (NEURONTIN) 600 MG tablet Take 1,200 mg by mouth 3 (three) times daily.    Marland Kitchen KLOR-CON M20 20 MEQ tablet TAKE ONE TABLET BY MOUTH DAILY (Patient taking differently: Take 20 mEq by mouth daily.) 30 tablet 6  . Melatonin 10 MG TABS Take 10 mg by mouth at bedtime as needed (sleep).    . metoprolol succinate (TOPROL-XL) 25 MG 24 hr tablet TAKE ONE TABLET BY MOUTH AT BEDTIME (Patient taking differently: Take 25 mg by mouth at bedtime.) 90  tablet 0  . OLANZapine (ZYPREXA) 10 MG tablet Take 10 mg by mouth at bedtime.     No current facility-administered medications for this visit.    Allergies:   Nsaids, Adhesive [tape], Latex, and Sulfa antibiotics    ROS:  Please see the history of present illness.   Otherwise, review of systems are positive for none.   All other systems are reviewed and negative.    PHYSICAL EXAM: VS:  BP 122/80   Pulse 63   Ht 5\' 6"  (1.676 m)   Wt 286 lb 9.6 oz (130 kg)   LMP 09/30/2012   BMI 46.26 kg/m  , BMI Body mass index is 46.26 kg/m. GENERAL:  Well appearing NECK:  No jugular venous distention, waveform within normal limits, carotid upstroke brisk and symmetric, no bruits, no thyromegaly LUNGS:  Clear to auscultation bilaterally CHEST:  Unremarkable HEART:  PMI not displaced or sustained,S1 and S2 within normal limits, no S3, no S4, no clicks, no rubs, no murmurs ABD:  Flat, positive bowel sounds normal in frequency in pitch, no bruits, no rebound, no guarding, no midline pulsatile mass, no hepatomegaly, no splenomegaly EXT:  2 plus pulses throughout, no edema, no cyanosis no clubbing     EKG:  EKG is ordered today. The ekg ordered today demonstrates sinus rhythm, rate 63, axis within normal limits, intervals within normal limits, no acute ST-T wave changes.   Recent Labs: 02/20/2020: B Natriuretic Peptide 50.6 03/26/2020: TSH 3.621 12/22/2020: ALT 22; BUN 8; Creatinine, Ser 0.80; Hemoglobin 11.9; Platelets 307; Potassium 3.9; Sodium 139    Lipid Panel    Component Value Date/Time   CHOL 160 08/01/2019 0210   TRIG 77 08/01/2019 0210   HDL 47 08/01/2019 0210   CHOLHDL 3.4 08/01/2019 0210   VLDL 15 08/01/2019 0210   LDLCALC 98 08/01/2019 0210      Wt Readings from Last 3 Encounters:  02/06/21 286 lb 9.6 oz (130 kg)  12/22/20 247 lb (112 kg)  02/20/20 (!) 309 lb 1.4 oz (140.2 kg)      Other studies Reviewed: Additional studies/ records that were reviewed today include:  Labs. Review of the above records demonstrates: See elsewhere  ASSESSMENT AND PLAN:  PREOP: The patient has a high functional level.  She has no high risk findings and she is not going for high risk surgery.  She can stop her Eliquis now in anticipation of surgery Monday.  No further testing is indicated according to ACC/AHA guidelines.  PAF/FLUTTER:    Tammy [name] has a CHA2DS2 - VASc score of 2.  She has asymptomatic paroxysms of this.  She should restart her anticoagulation after surgery.  Otherwise no change in therapy.  SLEEP APNEA:  This is being treated.  However, she is not able to wear CPAP and she has tried multiple masks.   Current medicines are reviewed at length with the patient today.  The patient does not have concerns regarding medicines.  The following changes have been made:  no change  Labs/ tests ordered today include: None No orders of the defined types were placed in this encounter.    Disposition:   FU with me in one year.     Signed, Minus Breeding, MD  02/06/2021 10:40 AM    [facility]

## 2021-02-05 NOTE — Telephone Encounter (Signed)
Patient with diagnosis of afib on Eliquis for anticoagulation.    Procedure: ORIF left distal radius fracture Date of procedure: 02/09/21  CHA2DS2-VASc Score = 2  This indicates a 2.2% annual risk of stroke. The patient's score is based upon: CHF History: No HTN History: Yes Diabetes History: No Stroke History: No Vascular Disease History: No Age Score: 0 Gender Score: 1   CrCl 95 ml/min Platelet count 307  Per office protocol, patient can hold Eliquis for 2 days prior to procedure.

## 2021-02-05 NOTE — Telephone Encounter (Signed)
S/w Dr. Percival Spanish pt who has upcoming surgery 02/09/21. Pt agreeable to plan of care for pre op appt tomorrow with Dr. Percival Spanish 02/06/21 @ 10 am. Pt is grateful for our office doing everything we can so her surgery is not delayed. I will forward notes to MD for upcoming appt and FYI to requesting office pt has appt 02/06/21.

## 2021-02-05 NOTE — Telephone Encounter (Signed)
Called patient to discuss surgery details for left distal radius fracture. Surgery has been scheduled for Monday, 5/9, at 11:45 am. I will send detailed schedule via MyChart message. Patient asked if we could "send her more pain medicine in because she is in a lot of pain." I reviewed the pain medicine given by ED/urgent care -  Norco/Vicodi 5-325 mg, qty 15. I asked the patient how often she was taking the medication and how many pills she had remaining. She stated "taking 2 every four hours and she has 8 remaining." I asked if she was told whether she could take ibuprofen or tylenol. Patient stated that she "cannot take ibuprofen because of her stomach, but Tylenol doesn't really help her pain much." I advised that I would call Dr. Claudia Desanctis to inquire.   Call was placed to Dr. Claudia Desanctis regarding the above information. Dr. Claudia Desanctis advised that he is not calling in any additional pain medications at this time. He will review her pain medicine needs at the time of surgery, but if she is having extensive pain that cannot be managed, patient can to return to ED or urgent care. Dr. Claudia Desanctis also requested that I send her PCP and Cardiologist a surgery clearance; medications for review - permission to hold Eliquis with start/stop dates.   PCP listed at the time of request was Dr. Tiana Loft. Per Dwyane Luo at Dr. Royston Sinner office, surgery clearance refused due to hospital incident where patient left against medical advice, patient is non-compliant with multiple health issues.   Cardiology is seeing patient on 5/6 for cardiac workup/clearance. We will review after that appointment.   Call returned to patient - I advised patient that Dr. Claudia Desanctis said he will not call in any pain medications at this time. If she has not had any complications with tylenol, she can take tylenol at the recommended dose on the label. Otherwise, she will need to return to urgent care or ED. Patient expressed understanding and agreement.

## 2021-02-05 NOTE — Telephone Encounter (Signed)
   Name: Tammy [name]  DOB: 07-31-1961  MRN: [id number]  Primary Cardiologist: Minus Breeding, MD  Chart reviewed as part of pre-operative protocol coverage. Patient has not been seen by our office since 08/2019 and this was a virtual visit. She also follows with the [facility] but has not been seen by them since 01/2020. Because of Tammy [name]'s past medical history and time since last visit, she will require a follow-up visit in order to better assess preoperative cardiovascular risk. Procedure is scheduled for early next week 02/09/2021 so it may have to be postponed.  Pre-op covering staff: - Please schedule appointment and call patient to inform them. If patient already had an upcoming appointment within acceptable timeframe, please add "pre-op clearance" to the appointment notes so provider is aware. - Please contact requesting surgeon's office via preferred method (i.e, phone, fax) to inform them of need for appointment prior to surgery.  I will go ahead route note to pharmacy pool for their input on holding Eliquis as requested below so that this information is available to the clearing provider at time of patient's appointment.   Darreld Mclean, PA-C  02/05/2021, 11:15 AM

## 2021-02-05 NOTE — Telephone Encounter (Signed)
   [facility] Pre-operative Risk Assessment    Patient Name: Tammy [name]  DOB: 1961-05-20  MRN: [id number]   HEARTCARE STAFF: - Please ensure there is not already an duplicate clearance open for this procedure. - Under Visit Info/Reason for Call, type in Other and utilize the format Clearance MM/DD/YY or Clearance TBD. Do not use dashes or single digits. - If request is for dental extraction, please clarify the # of teeth to be extracted.  Request for surgical clearance:  1. What type of surgery is being performed? ORIF left distal radius fracture  2. When is this surgery scheduled? 02/09/21  3. What type of clearance is required (medical clearance vs. Pharmacy clearance to hold med vs. Both)? both  4. Are there any medications that need to be held prior to surgery and how long?eliquis-need direction   5. Practice name and name of physician performing surgery? [facility]   6. What is the office phone number? [phone]   7.   What is the office fax number? [phone]  8.   Anesthesia type (None, local, MAC, general) ? general   Fredia Beets 02/05/2021, 10:52 AM  _________________________________________________________________   (provider comments below)

## 2021-02-06 ENCOUNTER — Ambulatory Visit (INDEPENDENT_AMBULATORY_CARE_PROVIDER_SITE_OTHER): Payer: 59 | Admitting: Cardiology

## 2021-02-06 ENCOUNTER — Encounter: Payer: Self-pay | Admitting: Cardiology

## 2021-02-06 ENCOUNTER — Encounter (HOSPITAL_COMMUNITY): Payer: Self-pay | Admitting: Plastic Surgery

## 2021-02-06 ENCOUNTER — Other Ambulatory Visit: Payer: Self-pay

## 2021-02-06 VITALS — BP 122/80 | HR 63 | Ht 66.0 in | Wt 286.6 lb

## 2021-02-06 DIAGNOSIS — I4891 Unspecified atrial fibrillation: Secondary | ICD-10-CM

## 2021-02-06 DIAGNOSIS — Z01810 Encounter for preprocedural cardiovascular examination: Secondary | ICD-10-CM

## 2021-02-06 DIAGNOSIS — I10 Essential (primary) hypertension: Secondary | ICD-10-CM

## 2021-02-06 LAB — SARS CORONAVIRUS 2 (TAT 6-24 HRS): SARS Coronavirus 2: NEGATIVE

## 2021-02-06 NOTE — Progress Notes (Signed)
PCP - Dr. Ernie Hew Cardiologist - Dr. Percival Spanish   Chest x-ray - 12/22/20 EKG - 02/06/21 Stress Test -  ECHO - 02/20/20 Cardiac Cath -   Sleep Study - yes  CPAP - yes   Blood Thinner Instructions: eliquis last dose per pt 02/06/21 Aspirin Instructions:   ERAS Protcol - n/a  COVID TEST- 02/05/21 negative   Anesthesia review: yes  -------------  SDW INSTRUCTIONS:  Your procedure is scheduled on 02/09/21. Please report to [facility] Main Entrance "A" at [place].M., and check in at the Admitting office. Call this number if you have problems the morning of surgery: [phone]   Remember: Do not eat or drink after midnight the night before your surgery    Medications to take morning of surgery with a sip of water include: buPROPion (WELLBUTRIN XL)  clonazePAM (KLONOPIN) gabapentin (NEURONTIN)  metoprolol succinate (TOPROL-XL)    As of today, STOP taking any Aspirin (unless otherwise instructed by your surgeon), Aleve, Naproxen, Ibuprofen, Motrin, Advil, Goody's, BC's, all herbal medications, fish oil, and all vitamins.    The Morning of Surgery Do not wear jewelry, make-up or nail polish. Do not wear lotions, powders, or perfumes, or deodorant Do not shave 48 hours prior to surgery.  Do not bring valuables to the hospital. [facility] is not responsible for any belongings or valuables. If you are a smoker, DO NOT Smoke 24 hours prior to surgery If you wear a CPAP at night please bring your mask the morning of surgery  Remember that you must have someone to transport you home after your surgery, and remain with you for 24 hours if you are discharged the same day. Please bring cases for contacts, glasses, hearing aids, dentures or bridgework because it cannot be worn into surgery.   Patients discharged the day of surgery will not be allowed to drive home.   Please shower the NIGHT BEFORE SURGERY and the MORNING OF SURGERY with DIAL Soap. Wear comfortable clothes the morning of  surgery. Oral Hygiene is also important to reduce your risk of infection.  Remember - BRUSH YOUR TEETH THE MORNING OF SURGERY WITH YOUR REGULAR TOOTHPASTE  Patient denies shortness of breath, fever, cough and chest pain.

## 2021-02-06 NOTE — Anesthesia Preprocedure Evaluation (Addendum)
Anesthesia Evaluation  Patient identified by MRN, date of birth, ID band Patient awake    Reviewed: Allergy & Precautions, NPO status , Patient's Chart, lab work & pertinent test results  History of Anesthesia Complications Negative for: history of anesthetic complications  Airway Mallampati: II  TM Distance: >3 FB Neck ROM: Full    Dental no notable dental hx.    Pulmonary sleep apnea (noncompliant with CPAP) ,    Pulmonary exam normal breath sounds clear to auscultation       Cardiovascular Normal cardiovascular exam+ dysrhythmias (on Eliquis) Atrial Fibrillation  Rhythm:Regular Rate:Normal  TTE 02/20/2020: EF 60-65%, mild LVH, normal RV function, severely elevated PASP (RVSP 61.9 mmHg)   Neuro/Psych Seizures - (x1 in 2017), Well Controlled,  PSYCHIATRIC DISORDERS Anxiety Depression    GI/Hepatic Neg liver ROS, S/p gastric bypass   Endo/Other  Morbid obesity  Renal/GU negative Renal ROS     Musculoskeletal  (+) Arthritis ,   Abdominal   Peds  Hematology negative hematology ROS (+)   Anesthesia Other Findings Left distal radius fracture  Reproductive/Obstetrics                            Anesthesia Physical Anesthesia Plan  ASA: III  Anesthesia Plan: Regional and General   Post-op Pain Management: GA combined w/ Regional for post-op pain   Induction: Intravenous  PONV Risk Score and Plan: 3 and Treatment may vary due to age or medical condition, Ondansetron, Dexamethasone and Midazolam  Airway Management Planned: LMA  Additional Equipment:   Intra-op Plan:   Post-operative Plan: Extubation in OR  Informed Consent: I have reviewed the patients History and Physical, chart, labs and discussed the procedure including the risks, benefits and alternatives for the proposed anesthesia with the patient or authorized representative who has indicated his/her understanding and acceptance.      Dental advisory given  Plan Discussed with: CRNA  Anesthesia Plan Comments: (PAT note by Karoline Caldwell, PA-C: Follows with cardiology for history of paroxysmal atrial fibrillation/flutter.  She was seen by Dr. Percival Spanish 02/06/2021 for preop clearance.  Per note, "PREOP: The patient has a high functional level.  She has no high risk findings and she is not going for high risk surgery.  She can stop her Eliquis now in anticipation of surgery Monday.  No further testing is indicated according to ACC/AHA guidelines."  OSA, unable to tolerate CPAP.  Needs day of surgery labs and evaluation.  EKG 12/22/2020: Sinus rhythm.  Rate 86. Abnormal R-wave progression, early transition. Borderline repolarization abnormality. No significant change since last tracing  CHEST - 2 VIEW 12/22/2020: COMPARISON:  None.  FINDINGS: The heart size and mediastinal contours are within normal limits. Both lungs are clear. The visualized skeletal structures are unremarkable.  IMPRESSION: No active cardiopulmonary disease.  TTE 02/20/2020: 1. Left ventricular ejection fraction, by estimation, is 60 to 65%. The  left ventricle has normal function. The left ventricle has no regional  wall motion abnormalities. There is mild left ventricular hypertrophy.  Left ventricular diastolic parameters  were normal.  2. Right ventricular systolic function is normal. The right ventricular  size is normal. There is severely elevated pulmonary artery systolic  pressure.  3. The mitral valve is normal in structure. Trivial mitral valve  regurgitation. No evidence of mitral stenosis.  4. The aortic valve is normal in structure. Aortic valve regurgitation is  not visualized. No aortic stenosis is present.  5. The inferior  vena cava is normal in size with greater than 50%  respiratory variability, suggesting right atrial pressure of 3 mmHg.  Event monitor 09/18/2019: NSR Paroxysmal atrial flutter Paroxysmal atrial  fib )      Anesthesia Quick Evaluation

## 2021-02-06 NOTE — Patient Instructions (Signed)
Medication Instructions:  Continue same medications *If you need a refill on your cardiac medications before your next appointment, please call your pharmacy*   Lab Work: None ordered   Testing/Procedures: None ordered   Follow-Up: At [facility], you and your health needs are our priority.  As part of our continuing mission to provide you with exceptional heart care, we have created designated Provider Care Teams.  These Care Teams include your primary Cardiologist (physician) and Advanced Practice Providers (APPs -  Physician Assistants and Nurse Practitioners) who all work together to provide you with the care you need, when you need it.  We recommend signing up for the patient portal called "MyChart".  Sign up information is provided on this After Visit Summary.  MyChart is used to connect with patients for Virtual Visits (Telemedicine).  Patients are able to view lab/test results, encounter notes, upcoming appointments, etc.  Non-urgent messages can be sent to your provider as well.   To learn more about what you can do with MyChart, go to [email or web address].      Your next appointment:  1 year    The format for your next appointment: Office   Provider:  Dr.Hochrein   

## 2021-02-06 NOTE — Progress Notes (Signed)
Anesthesia Chart Review:  Follows with cardiology for history of paroxysmal atrial fibrillation/flutter.  She was seen by Dr. Percival Spanish 02/06/2021 for preop clearance.  Per note, "PREOP: The patient has a high functional level.  She has no high risk findings and she is not going for high risk surgery.  She can stop her Eliquis now in anticipation of surgery Monday.  No further testing is indicated according to ACC/AHA guidelines."  OSA, unable to tolerate CPAP.  Needs day of surgery labs and evaluation.  EKG 12/22/2020: Sinus rhythm.  Rate 86. Abnormal R-wave progression, early transition. Borderline repolarization abnormality. No significant change since last tracing  CHEST - 2 VIEW 12/22/2020: COMPARISON:  None.  FINDINGS: The heart size and mediastinal contours are within normal limits. Both lungs are clear. The visualized skeletal structures are unremarkable.  IMPRESSION: No active cardiopulmonary disease.  TTE 02/20/2020: 1. Left ventricular ejection fraction, by estimation, is 60 to 65%. The  left ventricle has normal function. The left ventricle has no regional  wall motion abnormalities. There is mild left ventricular hypertrophy.  Left ventricular diastolic parameters  were normal.  2. Right ventricular systolic function is normal. The right ventricular  size is normal. There is severely elevated pulmonary artery systolic  pressure.  3. The mitral valve is normal in structure. Trivial mitral valve  regurgitation. No evidence of mitral stenosis.  4. The aortic valve is normal in structure. Aortic valve regurgitation is  not visualized. No aortic stenosis is present.  5. The inferior vena cava is normal in size with greater than 50%  respiratory variability, suggesting right atrial pressure of 3 mmHg.  Event monitor 09/18/2019: NSR Paroxysmal atrial flutter Paroxysmal atrial fib   Tammy [name] [facility] Short Stay Center/Anesthesiology Phone [phone] 02/06/2021 2:12 PM

## 2021-02-09 ENCOUNTER — Ambulatory Visit (HOSPITAL_COMMUNITY)
Admission: RE | Admit: 2021-02-09 | Discharge: 2021-02-09 | Disposition: A | Discharge status: Home / Self Care | Payer: 59 | Attending: Plastic Surgery | Admitting: Plastic Surgery

## 2021-02-09 ENCOUNTER — Ambulatory Visit (HOSPITAL_COMMUNITY): Payer: 59 | Admitting: Physician Assistant

## 2021-02-09 ENCOUNTER — Other Ambulatory Visit: Payer: Self-pay | Admitting: Surgical

## 2021-02-09 ENCOUNTER — Encounter (HOSPITAL_COMMUNITY)
Admission: RE | Disposition: A | Discharge status: Home / Self Care | Payer: Self-pay | Source: Home / Self Care | Attending: Plastic Surgery

## 2021-02-09 ENCOUNTER — Ambulatory Visit (HOSPITAL_COMMUNITY): Payer: 59

## 2021-02-09 ENCOUNTER — Encounter (HOSPITAL_COMMUNITY): Payer: Self-pay | Admitting: Plastic Surgery

## 2021-02-09 DIAGNOSIS — S52572A Other intraarticular fracture of lower end of left radius, initial encounter for closed fracture: Secondary | ICD-10-CM | POA: Diagnosis not present

## 2021-02-09 DIAGNOSIS — I480 Paroxysmal atrial fibrillation: Secondary | ICD-10-CM | POA: Diagnosis not present

## 2021-02-09 DIAGNOSIS — Z9884 Bariatric surgery status: Secondary | ICD-10-CM | POA: Diagnosis not present

## 2021-02-09 DIAGNOSIS — I4892 Unspecified atrial flutter: Secondary | ICD-10-CM | POA: Diagnosis not present

## 2021-02-09 DIAGNOSIS — W19XXXA Unspecified fall, initial encounter: Secondary | ICD-10-CM | POA: Diagnosis not present

## 2021-02-09 DIAGNOSIS — S52502A Unspecified fracture of the lower end of left radius, initial encounter for closed fracture: Secondary | ICD-10-CM

## 2021-02-09 DIAGNOSIS — Z886 Allergy status to analgesic agent status: Secondary | ICD-10-CM | POA: Diagnosis not present

## 2021-02-09 DIAGNOSIS — Z91040 Latex allergy status: Secondary | ICD-10-CM | POA: Diagnosis not present

## 2021-02-09 DIAGNOSIS — Z888 Allergy status to other drugs, medicaments and biological substances status: Secondary | ICD-10-CM | POA: Insufficient documentation

## 2021-02-09 DIAGNOSIS — I517 Cardiomegaly: Secondary | ICD-10-CM | POA: Diagnosis not present

## 2021-02-09 DIAGNOSIS — Z882 Allergy status to sulfonamides status: Secondary | ICD-10-CM | POA: Diagnosis not present

## 2021-02-09 HISTORY — PX: ORIF RADIAL FRACTURE: SHX5113

## 2021-02-09 LAB — BASIC METABOLIC PANEL
Anion gap: 5 (ref 5–15)
BUN: 8 mg/dL (ref 6–20)
CO2: 27 mmol/L (ref 22–32)
Calcium: 8.6 mg/dL — ABNORMAL LOW (ref 8.9–10.3)
Chloride: 107 mmol/L (ref 98–111)
Creatinine, Ser: 0.8 mg/dL (ref 0.44–1.00)
GFR, Estimated: >60 mL/min (ref >60)
Glucose, Bld: 83 mg/dL (ref 70–99)
Potassium: 3.9 mmol/L (ref 3.5–5.1)
Sodium: 139 mmol/L (ref 135–145)

## 2021-02-09 SURGERY — OPEN REDUCTION INTERNAL FIXATION (ORIF) RADIAL FRACTURE
Anesthesia: Regional | Site: Wrist | Laterality: Left

## 2021-02-09 MED ORDER — DEXTROSE 5 % IV SOLN
3.0000 g | INTRAVENOUS | Status: AC
  Administered 2021-02-09: 3 g via INTRAVENOUS
  Filled 2021-02-09: qty 3000

## 2021-02-09 MED ORDER — LIDOCAINE 2% (20 MG/ML) 5 ML SYRINGE
INTRAMUSCULAR | Status: DC | PRN
  Administered 2021-02-09: 40 mg via INTRAVENOUS

## 2021-02-09 MED ORDER — FENTANYL CITRATE (PF) 250 MCG/5ML IJ SOLN
INTRAMUSCULAR | Status: AC
  Filled 2021-02-09: qty 5

## 2021-02-09 MED ORDER — LACTATED RINGERS IV SOLN: INTRAVENOUS | Status: DC

## 2021-02-09 MED ORDER — MIDAZOLAM HCL 2 MG/2ML IJ SOLN: 2.0000 mg | Freq: Once | INTRAMUSCULAR | Status: AC

## 2021-02-09 MED ORDER — CHLORHEXIDINE GLUCONATE 0.12 % MT SOLN
15.0000 mL | OROMUCOSAL | Status: AC
  Administered 2021-02-09: 15 mL via OROMUCOSAL
  Filled 2021-02-09 (×2): qty 15

## 2021-02-09 MED ORDER — LIDOCAINE 2% (20 MG/ML) 5 ML SYRINGE
INTRAMUSCULAR | Status: AC
  Filled 2021-02-09: qty 5

## 2021-02-09 MED ORDER — BUPIVACAINE-EPINEPHRINE (PF) 0.5% -1:200000 IJ SOLN
INTRAMUSCULAR | Status: DC | PRN
  Administered 2021-02-09: 30 mL via PERINEURAL

## 2021-02-09 MED ORDER — FENTANYL CITRATE (PF) 100 MCG/2ML IJ SOLN: 25.0000 ug | INTRAMUSCULAR | Status: DC | PRN

## 2021-02-09 MED ORDER — DEXAMETHASONE SODIUM PHOSPHATE 10 MG/ML IJ SOLN
INTRAMUSCULAR | Status: DC | PRN
  Administered 2021-02-09: 5 mg via INTRAVENOUS

## 2021-02-09 MED ORDER — OXYCODONE-ACETAMINOPHEN 10-325 MG PO TABS: 1.0000 | ORAL_TABLET | Freq: Four times a day (QID) | ORAL | 0 refills | Status: DC | PRN

## 2021-02-09 MED ORDER — PROPOFOL 10 MG/ML IV BOLUS
INTRAVENOUS | Status: DC | PRN
  Administered 2021-02-09: 200 mg via INTRAVENOUS

## 2021-02-09 MED ORDER — FENTANYL CITRATE (PF) 250 MCG/5ML IJ SOLN
INTRAMUSCULAR | Status: DC | PRN
  Administered 2021-02-09: 25 ug via INTRAVENOUS
  Administered 2021-02-09: 50 ug via INTRAVENOUS
  Administered 2021-02-09 (×2): 25 ug via INTRAVENOUS

## 2021-02-09 MED ORDER — FENTANYL CITRATE (PF) 100 MCG/2ML IJ SOLN
INTRAMUSCULAR | Status: AC
  Filled 2021-02-09: qty 2

## 2021-02-09 MED ORDER — PROMETHAZINE HCL 25 MG/ML IJ SOLN: 6.2500 mg | INTRAMUSCULAR | Status: DC | PRN

## 2021-02-09 MED ORDER — MIDAZOLAM HCL 2 MG/2ML IJ SOLN
INTRAMUSCULAR | Status: AC
  Administered 2021-02-09: 2 mg via INTRAVENOUS
  Filled 2021-02-09: qty 2

## 2021-02-09 MED ORDER — ONDANSETRON HCL 4 MG PO TABS: 4.0000 mg | ORAL_TABLET | Freq: Three times a day (TID) | ORAL | 0 refills | Status: DC | PRN

## 2021-02-09 MED ORDER — LIDOCAINE-EPINEPHRINE 1 %-1:100000 IJ SOLN
INTRAMUSCULAR | Status: DC | PRN
  Administered 2021-02-09: 20 mL

## 2021-02-09 MED ORDER — PROPOFOL 10 MG/ML IV BOLUS
INTRAVENOUS | Status: AC
  Filled 2021-02-09: qty 20

## 2021-02-09 MED ORDER — ONDANSETRON HCL 4 MG/2ML IJ SOLN
INTRAMUSCULAR | Status: DC | PRN
  Administered 2021-02-09: 4 mg via INTRAVENOUS

## 2021-02-09 MED ORDER — ACETAMINOPHEN 500 MG PO TABS
1000.0000 mg | ORAL_TABLET | Freq: Once | ORAL | Status: AC
  Administered 2021-02-09: 1000 mg via ORAL
  Filled 2021-02-09: qty 2

## 2021-02-09 MED ORDER — LIDOCAINE-EPINEPHRINE 1 %-1:100000 IJ SOLN
INTRAMUSCULAR | Status: AC
  Filled 2021-02-09: qty 1

## 2021-02-09 SURGICAL SUPPLY — 57 items
BIT DRILL 2.2 SS TIBIAL (BIT) ×2 IMPLANT
BLADE CLIPPER SURG (BLADE) ×2 IMPLANT
BNDG CMPR 9X4 STRL LF SNTH (GAUZE/BANDAGES/DRESSINGS) ×1
BNDG ELASTIC 3X5.8 VLCR STR LF (GAUZE/BANDAGES/DRESSINGS) ×2 IMPLANT
BNDG ELASTIC 4X5.8 VLCR STR LF (GAUZE/BANDAGES/DRESSINGS) ×2 IMPLANT
BNDG ESMARK 4X9 LF (GAUZE/BANDAGES/DRESSINGS) ×2 IMPLANT
BNDG GAUZE ELAST 4 BULKY (GAUZE/BANDAGES/DRESSINGS) ×2 IMPLANT
CANISTER SUCT 3000ML PPV (MISCELLANEOUS) ×2 IMPLANT
CORD BIPOLAR FORCEPS 12FT (ELECTRODE) ×2 IMPLANT
COVER SURGICAL LIGHT HANDLE (MISCELLANEOUS) ×2 IMPLANT
COVER WAND RF STERILE (DRAPES) ×2 IMPLANT
CUFF TOURN SGL QUICK 18X4 (TOURNIQUET CUFF) ×2 IMPLANT
DRAPE OEC MINIVIEW 54X84 (DRAPES) ×2 IMPLANT
DRAPE SURG 17X23 STRL (DRAPES) ×2 IMPLANT
GAUZE SPONGE 4X4 12PLY STRL (GAUZE/BANDAGES/DRESSINGS) ×2 IMPLANT
GAUZE XEROFORM 1X8 LF (GAUZE/BANDAGES/DRESSINGS) ×2 IMPLANT
GOWN STRL REUS W/ TWL LRG LVL3 (GOWN DISPOSABLE) ×2 IMPLANT
GOWN STRL REUS W/TWL LRG LVL3 (GOWN DISPOSABLE) ×4
K-WIRE 1.6 (WIRE) ×6
K-WIRE FX5X1.6XNS BN SS (WIRE) ×3
KIT BASIN OR (CUSTOM PROCEDURE TRAY) ×2 IMPLANT
KIT TURNOVER KIT B (KITS) ×2 IMPLANT
KWIRE FX5X1.6XNS BN SS (WIRE) ×3 IMPLANT
LOOP VESSEL MAXI BLUE (MISCELLANEOUS) ×2 IMPLANT
NEEDLE 22X1 1/2 (OR ONLY) (NEEDLE) ×2 IMPLANT
NS IRRIG 1000ML POUR BTL (IV SOLUTION) ×2 IMPLANT
PACK ORTHO EXTREMITY (CUSTOM PROCEDURE TRAY) ×2 IMPLANT
PAD ARMBOARD 7.5X6 YLW CONV (MISCELLANEOUS) ×4 IMPLANT
PAD CAST 3X4 CTTN HI CHSV (CAST SUPPLIES) ×1 IMPLANT
PAD CAST 4YDX4 CTTN HI CHSV (CAST SUPPLIES) ×1 IMPLANT
PADDING CAST COTTON 3X4 STRL (CAST SUPPLIES) ×2
PADDING CAST COTTON 4X4 STRL (CAST SUPPLIES) ×2
PEG LOCKING SMOOTH 2.2X16 (Screw) ×6 IMPLANT
PEG LOCKING SMOOTH 2.2X18 (Peg) ×6 IMPLANT
PEG LOCKING SMOOTH 2.2X20 (Screw) ×2 IMPLANT
PLATE STANDARD DVR LEFT (Plate) ×2 IMPLANT
PLATE STD DVR LT 24X51 (Plate) ×1 IMPLANT
SCREW 2.7X14MM (Screw) ×1 IMPLANT
SCREW BN 14X2.7XNONLOCK 3 LD (Screw) ×1 IMPLANT
SCREW LOCK 14X2.7X 3 LD TPR (Screw) ×1 IMPLANT
SCREW LOCKING 2.7X13MM (Screw) ×2 IMPLANT
SCREW LOCKING 2.7X14 (Screw) ×2 IMPLANT
SCREW MULTI DIRECTIONAL 2.7X18 (Screw) ×2 IMPLANT
SCREW NLOCK 2.7X14 (Screw) ×1 IMPLANT
SOL PREP PROV IODINE SCRUB 4OZ (MISCELLANEOUS) ×4 IMPLANT
SPLINT FIBERGLASS 4X30 (CAST SUPPLIES) ×2 IMPLANT
SPONGE LAP 4X18 RFD (DISPOSABLE) ×2 IMPLANT
SUT MNCRL AB 4-0 PS2 18 (SUTURE) IMPLANT
SUT VIC AB 3-0 FS2 27 (SUTURE) IMPLANT
SYR CONTROL 10ML LL (SYRINGE) ×2 IMPLANT
SYSTEM CHEST DRAIN TLS 7FR (DRAIN) ×2 IMPLANT
TOWEL GREEN STERILE (TOWEL DISPOSABLE) ×2 IMPLANT
TOWEL GREEN STERILE FF (TOWEL DISPOSABLE) ×2 IMPLANT
TUBE CONNECTING 12X1/4 (SUCTIONS) ×2 IMPLANT
TUBE EVACUATION TLS (MISCELLANEOUS) ×2 IMPLANT
UNDERPAD 30X36 HEAVY ABSORB (UNDERPADS AND DIAPERS) ×2 IMPLANT
WATER STERILE IRR 1000ML POUR (IV SOLUTION) ×2 IMPLANT

## 2021-02-09 NOTE — Consult Note (Signed)
Reason for Consult/CC: Left wrist fracture  Tammy [name] is an 60 y.o. female.  HPI: Patient presents to discuss operative fixation of her left wrist fracture.  She had a fall last week and sustained a comminuted displaced intra-articular distal radius fracture.  We will plan surgery based on the imaging and she is here for that today.  She reports normal sensation in her fingers  Allergies:  Allergies  Allergen Reactions  . Nsaids Other (See Comments)    Post Gastric Bypass  . Adhesive [Tape] Itching and Rash  . Latex Itching, Rash and Other (See Comments)  . Sulfa Antibiotics Itching and Rash    Medications:  Current Facility-Administered Medications:  .  ceFAZolin (ANCEF) 3 g in dextrose 5 % 50 mL IVPB, 3 g, Intravenous, On Call to OR, Cindra Presume, MD .  fentaNYL (SUBLIMAZE) 100 MCG/2ML injection, , , ,  .  lactated ringers infusion, , Intravenous, Continuous, Brennan Bailey, MD, Last Rate: 10 mL/hr at 02/09/21 1039, New Bag at 02/09/21 1039 .  midazolam (VERSED) 2 MG/2ML injection, , , ,   Past Medical History:  Diagnosis Date  . Anxiety   . Arthritis    neck and lower back - deg disc disease, fingers  . Depression   . Frequency of urination   . GERD (gastroesophageal reflux disease)   . History of seizure per pt no seizure since    11-07-2015  in setting of stress and sleep deprivation  /  negative work-up by neurologist unknown idiology  . Hypothyroidism   . Injury of thumb, left    03-31-2016    . Pelvic relaxation   . SUI (stress urinary incontinence, female)     Past Surgical History:  Procedure Laterality Date  . ANTERIOR AND POSTERIOR REPAIR  09/25/2012   Procedure: ANTERIOR (CYSTOCELE) AND POSTERIOR REPAIR (RECTOCELE);  Surgeon: Lahoma Crocker, MD;  Location: [facility];  Service: Gynecology;  Laterality: N/A;  . ANTERIOR AND POSTERIOR REPAIR WITH SACROSPINOUS FIXATION N/A 04/12/2016   Procedure: ANTERIOR AND POSTERIOR REPAIR WITH SACROSPINOUS LIGAMENT  SUSPENSION, CYSTOSCOPY;  Surgeon: Arvella Nigh, MD;  Location: [facility];  Service: Gynecology;  Laterality: N/A;  . APPENDECTOMY  1987  . BREAST EXCISIONAL BIOPSY Left    No scar visable   . BREAST REDUCTION SURGERY  2000  aprrox  . ENDOMETRIAL ABLATION W/ NOVASURE  2011   in office  . EXCISION LEFT BREAST BX  12-07-1999   benign  . LAPAROSCOPIC CHOLECYSTECTOMY  02-09-2005  . LAPAROSCOPIC VAGINAL HYSTERECTOMY WITH SALPINGO OOPHORECTOMY Bilateral 04/12/2016   Procedure: LAPAROSCOPIC ASSISTED VAGINAL HYSTERECTOMY WITH SALPINGO OOPHORECTOMY;  Surgeon: Arvella Nigh, MD;  Location: [place];  Service: Gynecology;  Laterality: Bilateral;  . REDUCTION MAMMAPLASTY Bilateral   . ROUX-EN-Y GASTRIC BYPASS  06/  2002  . TUBAL LIGATION  10/ 1990    Family History  Problem Relation Age of Onset  . Colon polyps Mother   . Heart disease Father 76       CABG  . Breast cancer Maternal Aunt   . Stomach cancer Maternal Grandmother   . Diabetes Maternal Grandmother   . Heart disease Maternal Grandmother   . Stomach cancer Maternal Uncle   . Colon cancer Maternal Uncle        x3  . Diabetes Maternal Aunt        Type I  . Diabetes Maternal Uncle        x5 - Type II  .  Heart disease Paternal Grandmother   . Esophageal cancer Neg Hx   . Gallbladder disease Neg Hx   . Seizures Neg Hx     Social History:  reports that she has never smoked. She has never used smokeless tobacco. She reports that she does not drink alcohol and does not use drugs.  Physical Exam Blood pressure (!) 157/80, pulse 72, temperature 97.6 F (36.4 C), temperature source Oral, resp. rate 17, height 5\' 6"  (1.676 m), weight 130.2 kg, last menstrual period 09/30/2012, SpO2 97 %. General:.  No acute distress.  Alert and oriented. Left hand: Fingers well-perfused normal cap refill.  Sensation is intact throughout.  She has normal flexion extension of all fingers and thumb.  No results found for  this or any previous visit (from the past 48 hour(s)).  No results found.  Imaging shows a comminuted displaced intra-articular left distal radius fracture.  Its displaced dorsally with loss of volar tilt.  There is a intra-articular step step-off on the ulnar side.  Assessment/Plan:  Patient presents for open reduction internal fixation of a left distal radius fracture.  We discussed the operative procedure in detail.  We discussed the risk that include bleeding, infection, damage to surrounding structures and need for additional procedures.  I discussed the location and orientation of the scars along with bony complications including nonunion, malunion and hardware complications.  All of her questions were answered we will plan to move forward today.  Cindra Presume 02/09/2021, 11:11 AM

## 2021-02-09 NOTE — Op Note (Signed)
Operative Note   DATE OF OPERATION: 02/09/2021  SURGICAL DEPARTMENT: Plastic Surgery  PREOPERATIVE DIAGNOSES: Comminuted intra-articular left distal radius fracture with more than 3 segments  POSTOPERATIVE DIAGNOSES:  same  PROCEDURE: Open reduction internal fixation comminuted intra-articular left distal radius fracture with more than 3 segments  SURGEON: Talmadge Coventry, MD  ASSISTANT: Verdie Shire, PA The advanced practice practitioner (APP) assisted throughout the case.  The APP was essential in retraction and counter traction when needed to make the case progress smoothly.  This retraction and assistance made it possible to see the tissue plans for the procedure.  The assistance was needed for blood control, tissue re-approximation and assisted with closure of the incision site.  ANESTHESIA:  General.   COMPLICATIONS: None.   INDICATIONS FOR PROCEDURE:  The patient, Tammy [name] is a 60 y.o. female born on Nov 04, 1960, is here for treatment of distal radius fracture MRN: [id number]  CONSENT:  Informed consent was obtained directly from the patient. Risks, benefits and alternatives were fully discussed. Specific risks including but not limited to bleeding, infection, hematoma, seroma, scarring, pain, contracture, asymmetry, wound healing problems, and need for further surgery were all discussed. The patient did have an ample opportunity to have questions answered to satisfaction.   DESCRIPTION OF PROCEDURE:  The patient was taken to the operating room. SCDs were placed and antibiotics were given.  General anesthesia was administered.  The patient's operative site was prepped and draped in a sterile fashion. A time out was performed and all information was confirmed to be correct.  Started by marking out an FCR approach.  The arm was exsanguinated with Esmarch and tourniquet inflated to 250 mmHg.  FCR approach was then performed with a 15 blade for the skin.  I then dissected out  with tenotomy scissors to the FCR and the superficial sheath was incised and extended proximally and distally.  FCR was then retracted ulnarly and the subsheath was incised and extended proximally and distally.  Blunt dissection was then carried out down to the pronator quadratus which was elevated off the radial border distal radius with a 15 blade.  Elevator was then used for subperiosteal dissection to expose the volar cortex of the fracture.  15 blade was used to dissect brachioradialis off the radial styloid.  Reduction maneuvers were then performed to obtain a near anatomic reduction.  This was aided by traction which was applied with sterile finger traps and a 5 pound weight on over the end of the hand table.  A standard [employer] left distal radius plate was then fashioned in the appropriate position and secured temporarily with K wires.  C-arm was then used to confirm appropriate placement which was the case.  I then did distal first fixation using around pegs throughout with the exception of a multidirectional screw in the radial styloid.  The distal most row pegs were 16 mm in length and the proximal row were 18 mm in length.  I then turned my attention to the proximal aspect of the plate which was initially fashioned and secured with a cortical screw in the oblong hole followed by 2 locking screws on either side of the.  This gave a nice on table result.  All K wires were removed.  C-arm was used to confirm appropriate placement of the hardware and good reduction of the fracture.  Tourniquet was let down with a tourniquet time 51 minutes.  Pressure was held for hemostasis.  Cautery was used for any small bleeders.  Skin was then closed with interrupted buried 4 Monocryl sutures in addition to a running subcuticular Monocryl.  Volar splint was then applied.  The patient tolerated the procedure well.  There were no complications. The patient was allowed to wake from anesthesia, extubated and taken to  the recovery room in satisfactory condition.

## 2021-02-09 NOTE — Anesthesia Postprocedure Evaluation (Signed)
Anesthesia Post Note  Patient: Tammy [name]  Procedure(s) Performed: ORIF LEFT DISTAL RADIUS FRACTURE (Left Wrist)     Patient location during evaluation: PACU Anesthesia Type: Regional and General Level of consciousness: awake Pain management: pain level controlled Vital Signs Assessment: post-procedure vital signs reviewed and stable Respiratory status: spontaneous breathing, nonlabored ventilation, respiratory function stable and patient connected to nasal cannula oxygen Cardiovascular status: blood pressure returned to baseline and stable Postop Assessment: no apparent nausea or vomiting Anesthetic complications: no   No complications documented.  Last Vitals:  Vitals:   02/09/21 1421 02/09/21 1436  BP: 139/72 138/70  Pulse: 80 75  Resp: 11 12  Temp:  36.4 C  SpO2: 100% 98%    Last Pain:  Vitals:   02/09/21 1436  TempSrc:   PainSc: 0-No pain                 Gessica Jawad P Nery Frappier

## 2021-02-09 NOTE — Discharge Instructions (Addendum)
Activity As tolerated:  NO showers unless you can cover your splint with a waterproof device/bag and prevent it from getting wet. Do not shower for first 24 hours. No driving No heavy activities  Diet: Regular  Splint/Wound Care: Keep dressing clean & dry. Do not remove splint. Physical/Occupational therapy will put a new splint on your wrist in 1 week at your appointment with them. Do not change dressings  You will need an x-ray prior to your 2 week follow up with Dr. Claudia Desanctis and/or his team.  X-ray will be ordered for you and you can go to [facility] and you will be directed to radiology.  You can get the x-ray the day of (prior to appointment or the day before your appointment with Dr. Len Childs)  Take Tylenol and/or ibuprofen as needed for mild/moderate pain Take the percocet as needed for severe pain. Avoid over 3000mg  of tylenol in 24 hours.  Your block will wear off over 24-72 hours (this varies for each patient based on metabolism), so if you notice increased pain suddenly once the numbing medication has worn off, this can be common.  Call Doctor if any unusual problems occur such as pain, excessive Bleeding, unrelieved Nausea/vomiting, Fever &/or chills

## 2021-02-09 NOTE — H&P (View-Only) (Signed)
Reason for Consult/CC: Left wrist fracture  Tammy [name] is an 60 y.o. female.  HPI: Patient presents to discuss operative fixation of her left wrist fracture.  She had a fall last week and sustained a comminuted displaced intra-articular distal radius fracture.  We will plan surgery based on the imaging and she is here for that today.  She reports normal sensation in her fingers  Allergies:  Allergies  Allergen Reactions  . Nsaids Other (See Comments)    Post Gastric Bypass  . Adhesive [Tape] Itching and Rash  . Latex Itching, Rash and Other (See Comments)  . Sulfa Antibiotics Itching and Rash    Medications:  Current Facility-Administered Medications:  .  ceFAZolin (ANCEF) 3 g in dextrose 5 % 50 mL IVPB, 3 g, Intravenous, On Call to OR, Cindra Presume, MD .  fentaNYL (SUBLIMAZE) 100 MCG/2ML injection, , , ,  .  lactated ringers infusion, , Intravenous, Continuous, Brennan Bailey, MD, Last Rate: 10 mL/hr at 02/09/21 1039, New Bag at 02/09/21 1039 .  midazolam (VERSED) 2 MG/2ML injection, , , ,   Past Medical History:  Diagnosis Date  . Anxiety   . Arthritis    neck and lower back - deg disc disease, fingers  . Depression   . Frequency of urination   . GERD (gastroesophageal reflux disease)   . History of seizure per pt no seizure since    11-07-2015  in setting of stress and sleep deprivation  /  negative work-up by neurologist unknown idiology  . Hypothyroidism   . Injury of thumb, left    03-31-2016    . Pelvic relaxation   . SUI (stress urinary incontinence, female)     Past Surgical History:  Procedure Laterality Date  . ANTERIOR AND POSTERIOR REPAIR  09/25/2012   Procedure: ANTERIOR (CYSTOCELE) AND POSTERIOR REPAIR (RECTOCELE);  Surgeon: Lahoma Crocker, MD;  Location: [facility];  Service: Gynecology;  Laterality: N/A;  . ANTERIOR AND POSTERIOR REPAIR WITH SACROSPINOUS FIXATION N/A 04/12/2016   Procedure: ANTERIOR AND POSTERIOR REPAIR WITH SACROSPINOUS LIGAMENT  SUSPENSION, CYSTOSCOPY;  Surgeon: Arvella Nigh, MD;  Location: [facility];  Service: Gynecology;  Laterality: N/A;  . APPENDECTOMY  1987  . BREAST EXCISIONAL BIOPSY Left    No scar visable   . BREAST REDUCTION SURGERY  2000  aprrox  . ENDOMETRIAL ABLATION W/ NOVASURE  2011   in office  . EXCISION LEFT BREAST BX  12-07-1999   benign  . LAPAROSCOPIC CHOLECYSTECTOMY  02-09-2005  . LAPAROSCOPIC VAGINAL HYSTERECTOMY WITH SALPINGO OOPHORECTOMY Bilateral 04/12/2016   Procedure: LAPAROSCOPIC ASSISTED VAGINAL HYSTERECTOMY WITH SALPINGO OOPHORECTOMY;  Surgeon: Arvella Nigh, MD;  Location: [facility];  Service: Gynecology;  Laterality: Bilateral;  . REDUCTION MAMMAPLASTY Bilateral   . ROUX-EN-Y GASTRIC BYPASS  06/  2002  . TUBAL LIGATION  10/ 1990    Family History  Problem Relation Age of Onset  . Colon polyps Mother   . Heart disease Father 60       CABG  . Breast cancer Maternal Aunt   . Stomach cancer Maternal Grandmother   . Diabetes Maternal Grandmother   . Heart disease Maternal Grandmother   . Stomach cancer Maternal Uncle   . Colon cancer Maternal Uncle        x3  . Diabetes Maternal Aunt        Type I  . Diabetes Maternal Uncle        x5 - Type II  .  Heart disease Paternal Grandmother   . Esophageal cancer Neg Hx   . Gallbladder disease Neg Hx   . Seizures Neg Hx     Social History:  reports that she has never smoked. She has never used smokeless tobacco. She reports that she does not drink alcohol and does not use drugs.  Physical Exam Blood pressure (!) 157/80, pulse 72, temperature 97.6 F (36.4 C), temperature source Oral, resp. rate 17, height 5\' 6"  (1.676 m), weight 130.2 kg, last menstrual period 09/30/2012, SpO2 97 %. General:.  No acute distress.  Alert and oriented. Left hand: Fingers well-perfused normal cap refill.  Sensation is intact throughout.  She has normal flexion extension of all fingers and thumb.  No results found for  this or any previous visit (from the past 48 hour(s)).  No results found.  Imaging shows a comminuted displaced intra-articular left distal radius fracture.  Its displaced dorsally with loss of volar tilt.  There is a intra-articular step step-off on the ulnar side.  Assessment/Plan:  Patient presents for open reduction internal fixation of a left distal radius fracture.  We discussed the operative procedure in detail.  We discussed the risk that include bleeding, infection, damage to surrounding structures and need for additional procedures.  I discussed the location and orientation of the scars along with bony complications including nonunion, malunion and hardware complications.  All of her questions were answered we will plan to move forward today.  Cindra Presume 02/09/2021, 11:11 AM

## 2021-02-09 NOTE — Brief Op Note (Signed)
02/09/2021  1:29 PM  PATIENT:  Tammy [name]  60 y.o. female  PRE-OPERATIVE DIAGNOSIS:  left distal radius fracture  POST-OPERATIVE DIAGNOSIS:  left distal radius fracture  PROCEDURE:  Procedure(s) with comments: ORIF LEFT DISTAL RADIUS FRACTURE (Left) - 90 min  SURGEON:  Surgeon(s) and Role:    * Mackinzee Roszak, Steffanie Dunn, MD - Primary  PHYSICIAN ASSISTANT: Software engineer, PA  ASSISTANTS: none   ANESTHESIA:   general  EBL:  5 mL   BLOOD ADMINISTERED:none  DRAINS: none   LOCAL MEDICATIONS USED:  XYLOCAINE   SPECIMEN:  No Specimen  DISPOSITION OF SPECIMEN:  PATHOLOGY  COUNTS:  YES  TOURNIQUET:   Total Tourniquet Time Documented: Upper Arm (Left) - 51 minutes Total: Upper Arm (Left) - 51 minutes   DICTATION: .Viviann Spare Dictation  PLAN OF CARE: Discharge to home after PACU  PATIENT DISPOSITION:  PACU - hemodynamically stable.   Delay start of Pharmacological VTE agent (>24hrs) due to surgical blood loss or risk of bleeding: not applicable

## 2021-02-09 NOTE — Transfer of Care (Signed)
Immediate Anesthesia Transfer of Care Note  Patient: Tammy [name]  Procedure(s) Performed: ORIF LEFT DISTAL RADIUS FRACTURE (Left Wrist)  Patient Location: PACU  Anesthesia Type:General  Level of Consciousness: awake, alert  and oriented  Airway & Oxygen Therapy: Patient Spontanous Breathing and Patient connected to face mask oxygen  Post-op Assessment: Report given to RN and Post -op Vital signs reviewed and stable  Post vital signs: Reviewed and stable  Last Vitals:  Vitals Value Taken Time  BP    Temp    Pulse    Resp    SpO2      Last Pain:  Vitals:   02/09/21 1027  TempSrc:   PainSc: 8       Patients Stated Pain Goal: 5 (04/88/89 1694)  Complications: No complications documented.

## 2021-02-09 NOTE — Anesthesia Procedure Notes (Addendum)
Procedure Name: LMA Insertion Date/Time: 02/09/2021 12:00 PM Performed by: Janene Harvey, CRNA Pre-anesthesia Checklist: Patient identified, Emergency Drugs available, Suction available and Patient being monitored Patient Re-evaluated:Patient Re-evaluated prior to induction Oxygen Delivery Method: Circle system utilized Preoxygenation: Pre-oxygenation with 100% oxygen Induction Type: IV induction Ventilation: Mask ventilation without difficulty LMA: LMA inserted LMA Size: 4.0 Number of attempts: 1 Placement Confirmation: ETT inserted through vocal cords under direct vision,  positive ETCO2 and breath sounds checked- equal and bilateral Tube secured with: Tape Dental Injury: Teeth and Oropharynx as per pre-operative assessment  Comments: Placed by [facility]

## 2021-02-09 NOTE — Interval H&P Note (Signed)
History and Physical Interval Note:  02/09/2021 11:13 AM  Tammy [name]  has presented today for surgery, with the diagnosis of left distal radius fracture.  The various methods of treatment have been discussed with the patient and family. After consideration of risks, benefits and other options for treatment, the patient has consented to  Procedure(s) with comments: ORIF left distal radius fracture (Left) - 90 min as a surgical intervention.  The patient's history has been reviewed, patient examined, no change in status, stable for surgery.  I have reviewed the patient's chart and labs.  Questions were answered to the patient's satisfaction.     Cindra Presume

## 2021-02-09 NOTE — Anesthesia Procedure Notes (Signed)
Anesthesia Regional Block: Supraclavicular block   Pre-Anesthetic Checklist: ,, timeout performed, Correct Patient, Correct Site, Correct Laterality, Correct Procedure, Correct Position, site marked, Risks and benefits discussed,  Surgical consent,  Pre-op evaluation,  At surgeon's request and post-op pain management  Laterality: Left  Prep: chloraprep       Needles:  Injection technique: Single-shot  Needle Type: Echogenic Stimulator Needle     Needle Length: 10cm  Needle Gauge: 20     Additional Needles:   Procedures:,,,, ultrasound used (permanent image in chart),,,,  Narrative:  Start time: 02/09/2021 11:30 AM End time: 02/09/2021 11:40 AM Injection made incrementally with aspirations every 5 mL.  Performed by: Personally  Anesthesiologist: Murvin Natal, MD  Additional Notes: Functioning IV was confirmed and monitors were applied.  A timeout was performed. Sterile prep, hand hygiene and sterile gloves were used. A 15mm 20ga BBraun echogenic stimulator needle was used. Negative aspiration and negative test dose prior to incremental administration of local anesthetic. The patient tolerated the procedure well.  Ultrasound guidance: relevent anatomy identified, needle position confirmed, local anesthetic spread visualized around nerve(s), vascular puncture avoided.  Image printed for medical record.

## 2021-02-10 ENCOUNTER — Other Ambulatory Visit: Payer: Self-pay | Admitting: Plastic Surgery

## 2021-02-10 ENCOUNTER — Encounter (HOSPITAL_COMMUNITY): Payer: Self-pay | Admitting: Plastic Surgery

## 2021-02-10 DIAGNOSIS — S52502A Unspecified fracture of the lower end of left radius, initial encounter for closed fracture: Secondary | ICD-10-CM

## 2021-02-17 ENCOUNTER — Telehealth: Payer: Self-pay

## 2021-02-17 ENCOUNTER — Other Ambulatory Visit: Payer: Self-pay

## 2021-02-17 ENCOUNTER — Ambulatory Visit: Payer: 59 | Attending: Family Medicine | Admitting: Occupational Therapy

## 2021-02-17 DIAGNOSIS — M25532 Pain in left wrist: Secondary | ICD-10-CM | POA: Insufficient documentation

## 2021-02-17 DIAGNOSIS — R600 Localized edema: Secondary | ICD-10-CM | POA: Insufficient documentation

## 2021-02-17 DIAGNOSIS — M6281 Muscle weakness (generalized): Secondary | ICD-10-CM | POA: Diagnosis present

## 2021-02-17 DIAGNOSIS — M25632 Stiffness of left wrist, not elsewhere classified: Secondary | ICD-10-CM | POA: Insufficient documentation

## 2021-02-17 MED ORDER — OXYCODONE-ACETAMINOPHEN 7.5-325 MG PO TABS: 1.0000 | ORAL_TABLET | Freq: Three times a day (TID) | ORAL | 0 refills | Status: AC | PRN

## 2021-02-17 NOTE — Therapy (Signed)
[facility] [address] [place], [place], [address] Phone: [phone]   Fax:  [phone]  Occupational Therapy Evaluation  Patient Details  Name: Tammy [name] MRN: [id number] Date of Birth: 1960-12-18 Referring Provider (OT): Dr. Claudia Desanctis   Encounter Date: 02/17/2021   OT End of Session - 02/17/21 1042    Visit Number 1    Number of Visits 17    Date for OT Re-Evaluation 04/19/21    Authorization Type [employer]    OT Start Time 0930    OT Stop Time 1030    OT Time Calculation (min) 60 min    Activity Tolerance Patient tolerated treatment well    Behavior During Therapy [facility] for tasks assessed/performed           Past Medical History:  Diagnosis Date  . Anxiety   . Arthritis    neck and lower back - deg disc disease, fingers  . Depression   . Frequency of urination   . GERD (gastroesophageal reflux disease)   . History of seizure per pt no seizure since    11-07-2015  in setting of stress and sleep deprivation  /  negative work-up by neurologist unknown idiology  . Hypothyroidism   . Injury of thumb, left    03-31-2016    . Pelvic relaxation   . SUI (stress urinary incontinence, female)     Past Surgical History:  Procedure Laterality Date  . ANTERIOR AND POSTERIOR REPAIR  09/25/2012   Procedure: ANTERIOR (CYSTOCELE) AND POSTERIOR REPAIR (RECTOCELE);  Surgeon: Lahoma Crocker, MD;  Location: [facility];  Service: Gynecology;  Laterality: N/A;  . ANTERIOR AND POSTERIOR REPAIR WITH SACROSPINOUS FIXATION N/A 04/12/2016   Procedure: ANTERIOR AND POSTERIOR REPAIR WITH SACROSPINOUS LIGAMENT SUSPENSION, CYSTOSCOPY;  Surgeon: Arvella Nigh, MD;  Location: [facility];  Service: Gynecology;  Laterality: N/A;  . APPENDECTOMY  1987  . BREAST EXCISIONAL BIOPSY Left    No scar visable   . BREAST REDUCTION SURGERY  2000  aprrox  . ENDOMETRIAL ABLATION W/ NOVASURE  2011   in office  . EXCISION LEFT  BREAST BX  12-07-1999   benign  . LAPAROSCOPIC CHOLECYSTECTOMY  02-09-2005  . LAPAROSCOPIC VAGINAL HYSTERECTOMY WITH SALPINGO OOPHORECTOMY Bilateral 04/12/2016   Procedure: LAPAROSCOPIC ASSISTED VAGINAL HYSTERECTOMY WITH SALPINGO OOPHORECTOMY;  Surgeon: Arvella Nigh, MD;  Location: [facility];  Service: Gynecology;  Laterality: Bilateral;  . ORIF RADIAL FRACTURE Left 02/09/2021   Procedure: ORIF LEFT DISTAL RADIUS FRACTURE;  Surgeon: Cindra Presume, MD;  Location: [facility];  Service: Plastics;  Laterality: Left;  90 min  . REDUCTION MAMMAPLASTY Bilateral   . ROUX-EN-Y GASTRIC BYPASS  06/  2002  . TUBAL LIGATION  10/ 1990    There were no vitals filed for this visit.   Subjective Assessment - 02/17/21 0938    Pertinent History s/p ORIF Lt distal radius fx 02/09/21. PMH: GERD, OA, Rt elbox fx (no surgery)    Limitations no strengthening or wt bearing, no ROM until cleared by MD    Currently in Pain? Yes    Pain Score 7     Pain Location Wrist    Pain Orientation Left    Pain Descriptors / Indicators Aching    Pain Type Acute pain    Pain Onset 1 to 4 weeks ago    Pain Frequency Constant    Aggravating Factors  moving fingers  [facility] OT Assessment - 02/17/21 0001      Assessment   Medical Diagnosis s/p ORIF Lt distal radius fx    Referring Provider (OT) Dr. Claudia Desanctis    Onset Date/Surgical Date 02/09/21    Hand Dominance Right    Next MD Visit --   next Wednesday   Prior Therapy none      Precautions   Precautions Other (comment)    Precaution Comments no ROM or strengthening at this time (note sent to MD via EPIC when to begin ROM - withheld today d/t communited fx and only 1 week out)    Required Braces or Orthoses Other Brace/Splint    Other Brace/Splint volar wrist splint      Home  Environment   Lives With Spouse      Prior Function   Level of Independence Independent    Vocation Unemployed    Leisure reading      ADL   ADL comments Pt mod I for  all BADLS. Dependent for most IADLS at this time      Written Expression   Dominant Hand Right    Handwriting --   no changes     Edema   Edema moderate Lt wrist area      ROM / Strength   AROM / PROM / Strength AROM      AROM   Overall AROM Comments BUE AROM WFL's (except did not assess Lt wrist or forearm motion)                    OT Treatments/Exercises (OP) - 02/17/21 0001      ADLs   ADL Comments Carefully removed post surgical soft cast and cleaned hand/forearm prior to splint fabrication. Reviewed precautions and hand hygiene with patient.      Exercises   Exercises --   Pt instructed in A/ROM ex's for shoulder, elbow, and fingers to prevent stiffness. Pt encouraged to perform about every 2 hours x 10 reps each     Splinting   Splinting Fabricated and fitted volar wrist splint to immobolize wrist. Issued splint and educated in splint wear and care                 OT Education - 02/17/21 1022    Education Details splint wear and care, precautions, hygiene care, HEP for shoulder, elbow, and fingers    Person(s) Educated Patient    Methods Explanation;Demonstration;Handout    Comprehension Verbalized understanding            OT Short Term Goals - 02/17/21 1048      OT SHORT TERM GOAL #1   Title Independent with splint wear and care    Time 2    Period Weeks    Status On-going      OT SHORT TERM GOAL #2   Title Indpendent with initial HEP    Time 4    Period Weeks    Status New      OT SHORT TERM GOAL #3   Title Pt to verbalize understanding with scar tissue massage and edema management strategies    Time 4    Period Weeks    Status New      OT SHORT TERM GOAL #4   Title Pain Lt wrist to 4/10 or under with A/ROM HEP    Baseline 7/10 at rest    Time 4    Period Weeks    Status New  OT Long Term Goals - 02/17/21 1050      OT LONG TERM GOAL #1   Title Independent with updated HEP    Time 8    Period Weeks     Status New      OT LONG TERM GOAL #2   Title Pt to demo Lt wrist and forearm ROM WFL's    Time 8    Period Weeks    Status New      OT LONG TERM GOAL #3   Title Lt grip strength to be 35 lbs or greater to assist with opening jars/containers    Time 8    Period Weeks    Status New      OT LONG TERM GOAL #4   Title Pt to return to using hand as assist for light bilateral tasks and ADLS    Time 8    Period Weeks    Status New                 Plan - 02/17/21 1044    Clinical Impression Statement Pt is a 60 y.o. female who presents to [facility] fully wrapped and protected s/p ORIF Lt distal radius fx on 02/09/21. Pt 1 week and 1 day post-op at this time. Pt comes today for evaluation and splinting purposes. Pt presents with pain and stiffness Lt wrist, increased edema, and overall weakness and would benefit from O.T. to address these deficits, for splint fabrication and adjustments, and to progress per protocol.    OT Occupational Profile and History Problem Focused Assessment - Including review of records relating to presenting problem    Occupational performance deficits (Please refer to evaluation for details): ADL's;IADL's;Leisure    Body Structure / Function / Physical Skills ADL;Strength;Dexterity;Pain;Edema;UE functional use;IADL;ROM;Sensation;Flexibility;FMC;Decreased knowledge of precautions    Rehab Potential Good    Clinical Decision Making Several treatment options, min-mod task modification necessary    Comorbidities Affecting Occupational Performance: May have comorbidities impacting occupational performance    Modification or Assistance to Complete Evaluation  No modification of tasks or assist necessary to complete eval    OT Frequency 2x / week   anticipate only 1x/wk needed initially until pt can progress with more activity   OT Duration 8 weeks    OT Treatment/Interventions Self-care/ADL training;Moist Heat;Fluidtherapy;DME and/or AE instruction;Splinting;Contrast  Bath;Compression bandaging;Therapeutic activities;Ultrasound;Therapeutic exercise;Scar mobilization;Cryotherapy;Passive range of motion;Electrical Stimulation;Paraffin;Manual Therapy;Patient/family education    Plan splint adjustments prn, progress to A/ROM at wrist if cleared by MD, edema management and scar tissue management prn    Consulted and Agree with Plan of Care Patient           Patient will benefit from skilled therapeutic intervention in order to improve the following deficits and impairments:   Body Structure / Function / Physical Skills: ADL,Strength,Dexterity,Pain,Edema,UE functional use,IADL,ROM,Sensation,Flexibility,FMC,Decreased knowledge of precautions       Visit Diagnosis: Stiffness of left wrist, not elsewhere classified  Pain in left wrist  Muscle weakness (generalized)  Localized edema    Problem List Patient Active Problem List   Diagnosis Date Noted  . Closed nondisplaced fracture of neck of right radius 07/21/2020  . Somnolence   . Aspiration pneumonia ([place]) 02/20/2020  . Lactic acidosis 02/20/2020  . Acute metabolic encephalopathy 66/29/4765  . Sepsis ([place]) 02/20/2020  . Chronic pain of both knees 01/08/2020  . Encephalopathy acute 10/27/2019  . Acute encephalopathy 10/27/2019  . CAP (community acquired pneumonia) 07/31/2019  . Atrial fibrillation with RVR ([place]) 07/31/2019  . Morbid  obesity with BMI of 50.0-59.9, adult ([place]) 07/31/2019  . Respiratory failure ([place]) 04/14/2019  . Community acquired pneumonia 04/14/2019  . Closed nondisplaced fracture of head of left radius 06/20/2018  . Sprain of left wrist 06/20/2018  . Peroneal tendinitis, right leg 06/23/2017  . Trochanteric bursitis, right hip 06/23/2017  . Acute right-sided low back pain with left-sided sciatica 03/24/2017  . Pain in right hip 03/24/2017  . Impingement syndrome of left shoulder 08/30/2016  . Impingement syndrome of right shoulder 08/30/2016  . Plantar fasciitis,  bilateral 08/30/2016  . Pelvic relaxation 04/13/2016    Class: Present on Admission  . S/P hysterectomy 04/12/2016  . Depression 11/16/2015  . Generalized anxiety disorder 11/16/2015  . Convulsion ([place]) 11/16/2015  . SUI (stress urinary incontinence, female) 09/26/2012    Carey Bullocks, OTR/L 02/17/2021, 10:52 AM  [facility] [address] [place], [place], [address] Phone: [phone]   Fax:  [phone]  Name: SEQUOYAH RAMONE MRN: [id number] Date of Birth: 04-29-1961

## 2021-02-17 NOTE — Telephone Encounter (Signed)
Patient called requesting more pain medication.

## 2021-02-17 NOTE — Telephone Encounter (Signed)
Hi Dr. Lilia Argue Scheeler,   You see Tammy [name] next Wednesday for follow up. I made her splint today. I did not initiate ROM to wrist today as she was only 1 week out and it was comminuted fracture. Let me know after x-rays next week if we may begin A/ROM to wrist.   Thank you,   Redmond Baseman, OTR/L

## 2021-02-17 NOTE — Patient Instructions (Signed)
SPLINT WEAR AND CARE:  WEARING SCHEDULE:  Wear splint at ALL times except for hygiene care PURPOSE:  To prevent movement and for protection until injury can heal  CARE OF SPLINT:  Keep splint away from heat sources including: stove, radiator or furnace, or a car in sunlight. The splint can melt and will no longer fit you properly  Keep away from pets and children  Clean the splint with rubbing alcohol 1-2 times per day.  * During this time, make sure you also clean your hand/arm as instructed by your therapist and/or perform dressing changes as needed. Then dry hand/arm completely before replacing splint. (When cleaning hand/arm, keep it immobilized in same position until splint is replaced)  PRECAUTIONS/POTENTIAL PROBLEMS: *If you notice or experience increased pain, swelling, numbness, or a lingering reddened area from the splint: Contact your therapist immediately by calling [phone]. You must wear the splint for protection, but we will get you scheduled for adjustments as quickly as possible.  (If only straps or hooks need to be replaced and NO adjustments to the splint need to be made, just call the office ahead and let them know you are coming in)  If you have any medical concerns or signs of infection, please call your doctor immediately  EXERCISES:   Keep splint ON for below exercises. Do 4-6 times a day (about every 2 hours) x 10 reps each  1) Move arm up and down in front of you 2) Move arm out to side (snow angel) and down 3) Bend elbow up and down 4) Make a fist and open fully 5) Touch thumb to each finger (may not get to small finger - that's ok)

## 2021-02-17 NOTE — Telephone Encounter (Signed)
Additional post-op pain medication for wrist fracture, no additional prescription will be sent to pharmacy. Decreased dose from Percocet 10 to Percocet 7.5

## 2021-02-23 ENCOUNTER — Other Ambulatory Visit: Payer: Self-pay

## 2021-02-23 ENCOUNTER — Ambulatory Visit (HOSPITAL_COMMUNITY)
Admission: RE | Admit: 2021-02-23 | Discharge: 2021-02-23 | Disposition: A | Discharge status: Home / Self Care | Payer: 59 | Source: Ambulatory Visit | Attending: Plastic Surgery | Admitting: Plastic Surgery

## 2021-02-23 DIAGNOSIS — S52502A Unspecified fracture of the lower end of left radius, initial encounter for closed fracture: Secondary | ICD-10-CM | POA: Diagnosis not present

## 2021-02-25 ENCOUNTER — Ambulatory Visit (INDEPENDENT_AMBULATORY_CARE_PROVIDER_SITE_OTHER): Payer: 59 | Admitting: Plastic Surgery

## 2021-02-25 ENCOUNTER — Other Ambulatory Visit: Payer: Self-pay

## 2021-02-25 DIAGNOSIS — S52502A Unspecified fracture of the lower end of left radius, initial encounter for closed fracture: Secondary | ICD-10-CM

## 2021-02-25 MED ORDER — HYDROCODONE-ACETAMINOPHEN 5-325 MG PO TABS: 1.0000 | ORAL_TABLET | Freq: Four times a day (QID) | ORAL | 0 refills | Status: DC | PRN

## 2021-02-25 NOTE — Progress Notes (Signed)
Patient presents about 2 weeks postop from open reduction internal fixation of the left distal radius fracture.  She feels good overall and has been to therapy for a removable splint.  On exam she has normal range of motion in her fingers and her incision looks to be healing fine.  On x-ray the fracture looks to be in reasonably good alignment.  There is a small lunate facet fragment that looks to have separated a millimeter or less from the remainder of the fragments but is overall maintained in reasonable position for now.  We will keep an eye on this as time goes on.  And plan to see her back in 3 weeks with another x-ray.  All of her questions were answered.

## 2021-03-03 ENCOUNTER — Other Ambulatory Visit: Payer: Self-pay

## 2021-03-03 ENCOUNTER — Ambulatory Visit: Payer: 59 | Admitting: Occupational Therapy

## 2021-03-03 DIAGNOSIS — R600 Localized edema: Secondary | ICD-10-CM

## 2021-03-03 DIAGNOSIS — M25532 Pain in left wrist: Secondary | ICD-10-CM

## 2021-03-03 DIAGNOSIS — M6281 Muscle weakness (generalized): Secondary | ICD-10-CM

## 2021-03-03 DIAGNOSIS — M25632 Stiffness of left wrist, not elsewhere classified: Secondary | ICD-10-CM

## 2021-03-03 NOTE — Therapy (Signed)
[facility] [address] [place], [place], [address] Phone: [phone]   Fax:  [phone]  Patient Details  Name: Tammy [name] MRN: [id number] Date of Birth: 12-30-1960 Referring Provider:  Fanny Bien, MD  Encounter Date: 03/03/2021   Tammy [name] 03/03/2021, 3:17 PM Pt arrived wearing her splint. MD has said pt can't begin moving her wrist until after her next MD appointment.  Pt was provided with new stockinette an velcro. Splint is fitting fine without issues Pt was instructed to continue wearing splint at all times except hygeine and no heavy use of her left hand. Pt was instructed to continue with digital ROM in splint. No charge for today's visit. [facility] [address] [place], [place], [address] Phone: [phone]   Fax:  [phone]

## 2021-03-04 ENCOUNTER — Telehealth: Payer: Self-pay

## 2021-03-04 NOTE — Telephone Encounter (Signed)
Patient called to request a refill on her pain medication.  Please call.  **Patient said her preferred pharmacy is [facility], [phone] [place].

## 2021-03-04 NOTE — Telephone Encounter (Signed)
Returned patients call. She is requesting a refill of pain medication. Dr. Claudia Desanctis had sent a refill of Hydrocodone 5-325 on 02/25/2021. Advised her Dr. Claudia Desanctis will no longer refill the Hydrocodone. She may alternate IBU and Tylenol. Patient indicated she can not take IBU due to stomach issues. Suggested to take Tylenol 500 mg every 6 hours, do not exceed nor more than 3000 mg in 24 hour period. Will see her at her next follow up on 03/19/2021 at 3:15 pm

## 2021-03-10 ENCOUNTER — Encounter: Payer: 59 | Admitting: Occupational Therapy

## 2021-03-16 ENCOUNTER — Ambulatory Visit (HOSPITAL_COMMUNITY)
Admission: RE | Admit: 2021-03-16 | Discharge: 2021-03-16 | Disposition: A | Discharge status: Home / Self Care | Payer: 59 | Source: Ambulatory Visit | Attending: Plastic Surgery | Admitting: Plastic Surgery

## 2021-03-16 ENCOUNTER — Other Ambulatory Visit: Payer: Self-pay

## 2021-03-16 DIAGNOSIS — S52502A Unspecified fracture of the lower end of left radius, initial encounter for closed fracture: Secondary | ICD-10-CM | POA: Diagnosis not present

## 2021-03-17 ENCOUNTER — Encounter: Payer: 59 | Admitting: Occupational Therapy

## 2021-03-17 ENCOUNTER — Other Ambulatory Visit: Payer: Self-pay | Admitting: Cardiology

## 2021-03-18 NOTE — Telephone Encounter (Signed)
Prescription refill request for Eliquis received. Indication:atrial fib Last office visit:5/22 Scr:0.8 Age: 60 Weight:130.2 kg  Prescription refilled

## 2021-03-19 ENCOUNTER — Other Ambulatory Visit: Payer: Self-pay

## 2021-03-19 ENCOUNTER — Ambulatory Visit (INDEPENDENT_AMBULATORY_CARE_PROVIDER_SITE_OTHER): Payer: 59 | Admitting: Plastic Surgery

## 2021-03-19 DIAGNOSIS — S52502A Unspecified fracture of the lower end of left radius, initial encounter for closed fracture: Secondary | ICD-10-CM

## 2021-03-19 MED ORDER — CELECOXIB 200 MG PO CAPS: 200.0000 mg | ORAL_CAPSULE | Freq: Two times a day (BID) | ORAL | 1 refills | Status: DC

## 2021-03-19 NOTE — Progress Notes (Signed)
Patient presents about 5 weeks out from open reduction internal fixation of the left distal radius fracture.  She still has some pain but overall seems to be making progress.  X-ray done the other day shows maintained alignment of the fracture pattern.  There is a ulnar corner fragment that I have been watching but its alignment has been maintained and there looks to be bridging around it so I think that should end up with a satisfactory result for.  Given that area of concern we will plan to keep her in her splint for another 2 weeks or so to encourage that to heal a bit.  I will see her back around that time with another x-ray and we can likely discontinue the splint at that time and move a little further along with her range of motion.  I am also going prescribe Celebrex to her to try to get some better pain control.  All of her questions were answered.

## 2021-03-24 ENCOUNTER — Ambulatory Visit: Payer: 59 | Admitting: Occupational Therapy

## 2021-03-27 ENCOUNTER — Other Ambulatory Visit (HOSPITAL_COMMUNITY): Payer: Self-pay | Admitting: Nurse Practitioner

## 2021-03-31 ENCOUNTER — Encounter: Payer: 59 | Admitting: Occupational Therapy

## 2021-03-31 ENCOUNTER — Ambulatory Visit (HOSPITAL_COMMUNITY)
Admission: RE | Admit: 2021-03-31 | Discharge: 2021-03-31 | Disposition: A | Discharge status: Home / Self Care | Payer: 59 | Source: Ambulatory Visit | Attending: Plastic Surgery | Admitting: Plastic Surgery

## 2021-03-31 ENCOUNTER — Other Ambulatory Visit: Payer: Self-pay

## 2021-03-31 DIAGNOSIS — S52502A Unspecified fracture of the lower end of left radius, initial encounter for closed fracture: Secondary | ICD-10-CM | POA: Insufficient documentation

## 2021-04-01 ENCOUNTER — Ambulatory Visit (INDEPENDENT_AMBULATORY_CARE_PROVIDER_SITE_OTHER): Payer: 59 | Admitting: Plastic Surgery

## 2021-04-01 DIAGNOSIS — S52502A Unspecified fracture of the lower end of left radius, initial encounter for closed fracture: Secondary | ICD-10-CM

## 2021-04-01 NOTE — Progress Notes (Signed)
Patient presents about 6 weeks out from open reduction internal fixation of a left distal radius fracture.  She feels like things are coming along okay.  She has not been doing much range of motion at my request.  Recent x-ray shows healing of the ulnar distal radial lunate fossa fragment and has maintained its position for the past 3 weeks.  No signs of shifting in the alignment of the bone or the hardware.  On exam she has good finger wrist range of motion but limited wrist flexion and extension.  We will plan to get her into therapy to start to be more aggressive in terms of range of motion and strengthening.  She satisfied with that and I will plan to see her in 6 weeks.  All of her questions were answered.

## 2021-04-14 ENCOUNTER — Other Ambulatory Visit: Payer: Self-pay

## 2021-04-14 ENCOUNTER — Ambulatory Visit: Payer: 59 | Attending: Family Medicine | Admitting: Occupational Therapy

## 2021-04-14 DIAGNOSIS — M25532 Pain in left wrist: Secondary | ICD-10-CM | POA: Diagnosis present

## 2021-04-14 DIAGNOSIS — M25632 Stiffness of left wrist, not elsewhere classified: Secondary | ICD-10-CM | POA: Diagnosis present

## 2021-04-14 DIAGNOSIS — M6281 Muscle weakness (generalized): Secondary | ICD-10-CM

## 2021-04-14 DIAGNOSIS — R600 Localized edema: Secondary | ICD-10-CM

## 2021-04-14 NOTE — Patient Instructions (Addendum)
Perform scar massage with lotion 5 mins 2x day to incision   AROM: Wrist Extension   .  With __left__ palm down, bend wrist up. Repeat __15__ times per set.  Do __3-4__ sessions per day.        AROM: Forearm Pronation / Supination   With left____ arm in handshake position, slowly rotate palm down until stretch is felt. Relax. Then rotate palm up until stretch is felt. Repeat _15___ times per set. Do _3-4___ sessions per day.  Copyright  VHI. All rights reserved.       Flexor Tendon Gliding (Active Hook Fist)   With fingers and knuckles straight, bend middle and tip joints. Do not bend large knuckles. Repeat _10-15___ times. Do _4-6___ sessions per day.  MP Flexion (Active)   With back of hand on table, bend large knuckles as far as they will go, keeping small joints straight. Repeat _10-15___ times. Do __4-6__ sessions per day. Activity: Reach into a narrow container.*      Finger Flexion / Extension   With palm up, bend fingers of left hand toward palm, making a  fist. Straighten fingers, opening fist. Repeat sequence _10-15___ times per session. Do _4-6__ sessions per day. Hand Variation: Palm down   Copyright  VHI. All rights reserved.      Opposition (Active)   Touch tip of thumb to nail tip of each finger in turn, making an "O" shape. Repeat __10__ times. Do _4-6___ sessions per day.   MP Flexion (Active)   Bend thumb to touch base of little finger, keeping tip joint straight. Repeat __10-15__ times. Do _4-6___ sessions per day.

## 2021-04-14 NOTE — Therapy (Signed)
[facility] [address] [place], [place], [address] Phone: [phone]   Fax:  [phone]  Occupational Therapy Treatment  Patient Details  Name: Tammy [name] MRN: [id number] Date of Birth: [birth year] Referring Provider (OT): Dr. Claudia Desanctis   Encounter Date: 04/14/2021   OT End of Session - 04/14/21 1503     Visit Number 2    Number of Visits 17    Date for OT Re-Evaluation 04/19/21    Authorization Type [employer]    OT Start Time 7782    OT Stop Time 4235    OT Time Calculation (min) 38 min    Activity Tolerance Patient tolerated treatment well    Behavior During Therapy [facility] for tasks assessed/performed             Past Medical History:  Diagnosis Date   Anxiety    Arthritis    neck and lower back - deg disc disease, fingers   Depression    Frequency of urination    GERD (gastroesophageal reflux disease)    History of seizure per pt no seizure since    11-07-2015  in setting of stress and sleep deprivation  /  negative work-up by neurologist unknown idiology   Hypothyroidism    Injury of thumb, left    03-31-2016     Pelvic relaxation    SUI (stress urinary incontinence, female)     Past Surgical History:  Procedure Laterality Date   ANTERIOR AND POSTERIOR REPAIR  09/25/2012   Procedure: ANTERIOR (CYSTOCELE) AND POSTERIOR REPAIR (RECTOCELE);  Surgeon: Lahoma Crocker, MD;  Location: [facility];  Service: Gynecology;  Laterality: N/A;   ANTERIOR AND POSTERIOR REPAIR WITH SACROSPINOUS FIXATION N/A 04/12/2016   Procedure: ANTERIOR AND POSTERIOR REPAIR WITH SACROSPINOUS LIGAMENT SUSPENSION, CYSTOSCOPY;  Surgeon: Arvella Nigh, MD;  Location: [facility];  Service: Gynecology;  Laterality: N/A;   APPENDECTOMY  1987   BREAST EXCISIONAL BIOPSY Left    No scar visable    BREAST REDUCTION SURGERY  2000  aprrox   ENDOMETRIAL ABLATION W/ NOVASURE  2011   in office   EXCISION LEFT BREAST BX   12-07-1999   benign   LAPAROSCOPIC CHOLECYSTECTOMY  02-09-2005   LAPAROSCOPIC VAGINAL HYSTERECTOMY WITH SALPINGO OOPHORECTOMY Bilateral 04/12/2016   Procedure: LAPAROSCOPIC ASSISTED VAGINAL HYSTERECTOMY WITH SALPINGO OOPHORECTOMY;  Surgeon: Arvella Nigh, MD;  Location: [facility];  Service: Gynecology;  Laterality: Bilateral;   ORIF RADIAL FRACTURE Left 02/09/2021   Procedure: ORIF LEFT DISTAL RADIUS FRACTURE;  Surgeon: Cindra Presume, MD;  Location: [facility];  Service: Plastics;  Laterality: Left;  90 min   REDUCTION MAMMAPLASTY Bilateral    ROUX-EN-Y GASTRIC BYPASS  06/  2002   TUBAL LIGATION  10/ 1990    There were no vitals filed for this visit.   Subjective Assessment - 04/14/21 1502     Subjective  Pt reports wrist pain    Pertinent History s/p ORIF Lt distal radius fx 02/09/21. PMH: GERD, OA, Rt elbox fx (no surgery)    Limitations cleared for ROM per Dr Claudia Desanctis on 04/01/21, pt said MD told her she could d/c splint    Currently in Pain? Yes    Pain Score 6     Pain Location Wrist    Pain Orientation Left    Pain Descriptors / Indicators Aching    Pain Type Acute pain    Pain Onset 1 to 4 weeks ago    Pain Frequency  Constant    Aggravating Factors  movement    Pain Relieving Factors rest                                  OT Education - 04/14/21 1555     Education Details A/ROM HEP pt returned demonstration, 10-15 reps each, see pt instructions, scar massage, use of splint for heavier activities ie: sweeping    Person(s) Educated Patient    Methods Explanation;Demonstration;Handout    Comprehension Verbalized understanding;Returned demonstration              OT Short Term Goals - 04/14/21 1506       OT SHORT TERM GOAL #1   Title Independent with splint wear and care    Time 2    Period Weeks    Status Achieved      OT SHORT TERM GOAL #2   Title Indpendent with initial HEP    Time 4    Period Weeks    Status On-going      OT  SHORT TERM GOAL #3   Title Pt to verbalize understanding with scar tissue massage and edema management strategies    Time 4    Period Weeks    Status New      OT SHORT TERM GOAL #4   Title Pain Lt wrist to 4/10 or under with A/ROM HEP    Baseline 7/10 at rest    Time 4    Period Weeks    Status On-going               OT Long Term Goals - 02/17/21 1050       OT [place] #1   Title Independent with updated HEP    Time 8    Period Weeks    Status New      OT LONG TERM GOAL #2   Title Pt to demo Lt wrist and forearm ROM WFL's    Time 8    Period Weeks    Status New      OT LONG TERM GOAL #3   Title Lt grip strength to be 35 lbs or greater to assist with opening jars/containers    Time 8    Period Weeks    Status New      OT LONG TERM GOAL #4   Title Pt to return to using hand as assist for light bilateral tasks and ADLS    Time 8    Period Weeks    Status New                   Plan - 04/14/21 1504     Clinical Impression Statement Pt arrived today now cleared for ROM by Dr. Claudia Desanctis. Pt reports MD d/c her splint. Pt was instructed to wear splint only for heavier activities for protection.    OT Occupational Profile and History Problem Focused Assessment - Including review of records relating to presenting problem    Occupational performance deficits (Please refer to evaluation for details): ADL's;IADL's;Leisure    Body Structure / Function / Physical Skills ADL;Strength;Dexterity;Pain;Edema;UE functional use;IADL;ROM;Sensation;Flexibility;FMC;Decreased knowledge of precautions    Rehab Potential Good    Clinical Decision Making Several treatment options, min-mod task modification necessary    Comorbidities Affecting Occupational Performance: May have comorbidities impacting occupational performance    Modification or Assistance to Complete Evaluation  No modification of tasks or assist  necessary to complete eval    OT Frequency 2x / week   anticipate  only 1x/wk needed initially until pt can progress with more activity   OT Duration 8 weeks    OT Treatment/Interventions Self-care/ADL training;Moist Heat;Fluidtherapy;DME and/or AE instruction;Splinting;Contrast Bath;Compression bandaging;Therapeutic activities;Ultrasound;Therapeutic exercise;Scar mobilization;Cryotherapy;Passive range of motion;Electrical Stimulation;Paraffin;Manual Therapy;Patient/family education    Plan fluido, or paraffin, A/ROM, progress to gentle self P/ROM, consider putty    Consulted and Agree with Plan of Care Patient             Patient will benefit from skilled therapeutic intervention in order to improve the following deficits and impairments:   Body Structure / Function / Physical Skills: ADL, Strength, Dexterity, Pain, Edema, UE functional use, IADL, ROM, Sensation, Flexibility, FMC, Decreased knowledge of precautions       Visit Diagnosis: Stiffness of left wrist, not elsewhere classified  Pain in left wrist  Muscle weakness (generalized)  Localized edema    Problem List Patient Active Problem List   Diagnosis Date Noted   Closed nondisplaced fracture of neck of right radius 07/21/2020   Somnolence    Aspiration pneumonia ([place]) 02/20/2020   Lactic acidosis 52/77/8242   Acute metabolic encephalopathy 35/36/1443   Sepsis ([place]) 02/20/2020   Chronic pain of both knees 01/08/2020   Encephalopathy acute 10/27/2019   Acute encephalopathy 10/27/2019   CAP (community acquired pneumonia) 07/31/2019   Atrial fibrillation with RVR ([employer]) 07/31/2019   Morbid obesity with BMI of 50.0-59.9, adult ([place]) 07/31/2019   Respiratory failure ([place]) 04/14/2019   Community acquired pneumonia 04/14/2019   Closed nondisplaced fracture of head of left radius 06/20/2018   Sprain of left wrist 06/20/2018   Peroneal tendinitis, right leg 06/23/2017   Trochanteric bursitis, right hip 06/23/2017   Acute right-sided low back pain with left-sided sciatica 03/24/2017    Pain in right hip 03/24/2017   Impingement syndrome of left shoulder 08/30/2016   Impingement syndrome of right shoulder 08/30/2016   Plantar fasciitis, bilateral 08/30/2016   Pelvic relaxation 04/13/2016    Class: Present on Admission   S/P hysterectomy 04/12/2016   Depression 11/16/2015   Generalized anxiety disorder 11/16/2015   Convulsion ([place]) 11/16/2015   SUI (stress urinary incontinence, female) 09/26/2012    Lakera Viall 04/14/2021, 4:00 PM Theone Murdoch, OTR/L [phone] Phone: [phone] 4:01 PM 04/14/21  [facility] [address] [place], [place], [address] Phone: [phone]   Fax:  [phone]  Name: Tammy [name] MRN: [id number] Date of Birth: 1961/05/29

## 2021-04-21 ENCOUNTER — Ambulatory Visit: Payer: 59 | Admitting: Occupational Therapy

## 2021-04-21 ENCOUNTER — Other Ambulatory Visit: Payer: Self-pay

## 2021-04-21 ENCOUNTER — Encounter: Payer: Self-pay | Admitting: Occupational Therapy

## 2021-04-21 DIAGNOSIS — R600 Localized edema: Secondary | ICD-10-CM

## 2021-04-21 DIAGNOSIS — M25532 Pain in left wrist: Secondary | ICD-10-CM

## 2021-04-21 DIAGNOSIS — M6281 Muscle weakness (generalized): Secondary | ICD-10-CM

## 2021-04-21 DIAGNOSIS — M25632 Stiffness of left wrist, not elsewhere classified: Secondary | ICD-10-CM | POA: Diagnosis not present

## 2021-04-21 NOTE — Therapy (Signed)
[facility] [address] [place], [place], [address] Phone: [phone]   Fax:  [phone]  Occupational Therapy Treatment  Patient Details  Name: Tammy [name] MRN: [id number] Date of Birth: [birth year] Referring Provider (OT): Dr. Claudia Desanctis   Encounter Date: 04/21/2021   OT End of Session - 04/21/21 1500     Visit Number 3    Number of Visits 17    Date for OT Re-Evaluation 04/19/21    Authorization Type [employer]    OT Start Time 1450    OT Stop Time 1157    OT Time Calculation (min) 40 min    Activity Tolerance Patient tolerated treatment well    Behavior During Therapy [facility] for tasks assessed/performed             Past Medical History:  Diagnosis Date   Anxiety    Arthritis    neck and lower back - deg disc disease, fingers   Depression    Frequency of urination    GERD (gastroesophageal reflux disease)    History of seizure per pt no seizure since    11-07-2015  in setting of stress and sleep deprivation  /  negative work-up by neurologist unknown idiology   Hypothyroidism    Injury of thumb, left    03-31-2016     Pelvic relaxation    SUI (stress urinary incontinence, female)     Past Surgical History:  Procedure Laterality Date   ANTERIOR AND POSTERIOR REPAIR  09/25/2012   Procedure: ANTERIOR (CYSTOCELE) AND POSTERIOR REPAIR (RECTOCELE);  Surgeon: Lahoma Crocker, MD;  Location: [facility];  Service: Gynecology;  Laterality: N/A;   ANTERIOR AND POSTERIOR REPAIR WITH SACROSPINOUS FIXATION N/A 04/12/2016   Procedure: ANTERIOR AND POSTERIOR REPAIR WITH SACROSPINOUS LIGAMENT SUSPENSION, CYSTOSCOPY;  Surgeon: Arvella Nigh, MD;  Location: [facility];  Service: Gynecology;  Laterality: N/A;   APPENDECTOMY  1987   BREAST EXCISIONAL BIOPSY Left    No scar visable    BREAST REDUCTION SURGERY  2000  aprrox   ENDOMETRIAL ABLATION W/ NOVASURE  2011   in office   EXCISION LEFT BREAST BX   12-07-1999   benign   LAPAROSCOPIC CHOLECYSTECTOMY  02-09-2005   LAPAROSCOPIC VAGINAL HYSTERECTOMY WITH SALPINGO OOPHORECTOMY Bilateral 04/12/2016   Procedure: LAPAROSCOPIC ASSISTED VAGINAL HYSTERECTOMY WITH SALPINGO OOPHORECTOMY;  Surgeon: Arvella Nigh, MD;  Location: [facility];  Service: Gynecology;  Laterality: Bilateral;   ORIF RADIAL FRACTURE Left 02/09/2021   Procedure: ORIF LEFT DISTAL RADIUS FRACTURE;  Surgeon: Cindra Presume, MD;  Location: [facility];  Service: Plastics;  Laterality: Left;  90 min   REDUCTION MAMMAPLASTY Bilateral    ROUX-EN-Y GASTRIC BYPASS  06/  2002   TUBAL LIGATION  10/ 1990    There were no vitals filed for this visit.   Subjective Assessment - 04/21/21 1454     Subjective  Pt reports significant pain in LUE wrist still. Pt reports not wearing the splint at all and using her hand with "regular" use.    Pertinent History s/p ORIF Lt distal radius fx 02/09/21. PMH: GERD, OA, Rt elbox fx (no surgery)    Limitations cleared for ROM per Dr Claudia Desanctis on 04/01/21, pt said MD told her she could d/c splint    Currently in Pain? Yes    Pain Score 7     Pain Location Wrist    Pain Orientation Left    Pain Descriptors / Indicators Aching  Pain Type Acute pain    Pain Onset 1 to 4 weeks ago    Pain Frequency Constant    Aggravating Factors  supination/pronation                          OT Treatments/Exercises (OP) - 04/21/21 1457       Exercises   Exercises Wrist      Wrist Exercises   Other wrist exercises AROM wrist exercises, tendon glides - review of HEP. did not progress to PROM d/t significant pain.    Other wrist exercises Forearm Gym x 5 revolutions      Modalities   Modalities Fluidotherapy      LUE Fluidotherapy   Number Minutes Fluidotherapy 12 Minutes    LUE Fluidotherapy Location Wrist    Comments for stiffness and pain      Splinting   Splinting pt reports not wearing the splint at all and reports significant pain  in LUE wrist this day. OT encouraged patient to continue to wear splint with heavier activities and when in public/busy spaces.                      OT Short Term Goals - 04/14/21 1506       OT SHORT TERM GOAL #1   Title Independent with splint wear and care    Time 2    Period Weeks    Status Achieved      OT SHORT TERM GOAL #2   Title Indpendent with initial HEP    Time 4    Period Weeks    Status On-going      OT SHORT TERM GOAL #3   Title Pt to verbalize understanding with scar tissue massage and edema management strategies    Time 4    Period Weeks    Status New      OT SHORT TERM GOAL #4   Title Pain Lt wrist to 4/10 or under with A/ROM HEP    Baseline 7/10 at rest    Time 4    Period Weeks    Status On-going               OT Long Term Goals - 02/17/21 1050       OT LONG TERM GOAL #1   Title Independent with updated HEP    Time 8    Period Weeks    Status New      OT LONG TERM GOAL #2   Title Pt to demo Lt wrist and forearm ROM WFL's    Time 8    Period Weeks    Status New      OT LONG TERM GOAL #3   Title Lt grip strength to be 35 lbs or greater to assist with opening jars/containers    Time 8    Period Weeks    Status New      OT LONG TERM GOAL #4   Title Pt to return to using hand as assist for light bilateral tasks and ADLS    Time 8    Period Weeks    Status New                   Plan - 04/21/21 1707     Clinical Impression Statement Pt continues to report significant pain in LUE wrist. Pt encouraged to wear splint with heavier activities even though MD d/c splint d/t increased pain.  OT Occupational Profile and History Problem Focused Assessment - Including review of records relating to presenting problem    Occupational performance deficits (Please refer to evaluation for details): ADL's;IADL's;Leisure    Body Structure / Function / Physical Skills ADL;Strength;Dexterity;Pain;Edema;UE functional  use;IADL;ROM;Sensation;Flexibility;FMC;Decreased knowledge of precautions    Rehab Potential Good    Clinical Decision Making Several treatment options, min-mod task modification necessary    Comorbidities Affecting Occupational Performance: May have comorbidities impacting occupational performance    Modification or Assistance to Complete Evaluation  No modification of tasks or assist necessary to complete eval    OT Frequency 2x / week   anticipate only 1x/wk needed initially until pt can progress with more activity   OT Duration 8 weeks    OT Treatment/Interventions Self-care/ADL training;Moist Heat;Fluidtherapy;DME and/or AE instruction;Splinting;Contrast Bath;Compression bandaging;Therapeutic activities;Ultrasound;Therapeutic exercise;Scar mobilization;Cryotherapy;Passive range of motion;Electrical Stimulation;Paraffin;Manual Therapy;Patient/family education    Plan fluido, or paraffin, A/ROM, progress to gentle self P/ROM, consider putty    Consulted and Agree with Plan of Care Patient             Patient will benefit from skilled therapeutic intervention in order to improve the following deficits and impairments:   Body Structure / Function / Physical Skills: ADL, Strength, Dexterity, Pain, Edema, UE functional use, IADL, ROM, Sensation, Flexibility, FMC, Decreased knowledge of precautions       Visit Diagnosis: Stiffness of left wrist, not elsewhere classified  Pain in left wrist  Muscle weakness (generalized)  Localized edema    Problem List Patient Active Problem List   Diagnosis Date Noted   Closed nondisplaced fracture of neck of right radius 07/21/2020   Somnolence    Aspiration pneumonia ([place]) 02/20/2020   Lactic acidosis 55/73/2202   Acute metabolic encephalopathy 54/27/0623   Sepsis ([place]) 02/20/2020   Chronic pain of both knees 01/08/2020   Encephalopathy acute 10/27/2019   Acute encephalopathy 10/27/2019   CAP (community acquired pneumonia) 07/31/2019    Atrial fibrillation with RVR ([place]) 07/31/2019   Morbid obesity with BMI of 50.0-59.9, adult ([place]) 07/31/2019   Respiratory failure ([place]) 04/14/2019   Community acquired pneumonia 04/14/2019   Closed nondisplaced fracture of head of left radius 06/20/2018   Sprain of left wrist 06/20/2018   Peroneal tendinitis, right leg 06/23/2017   Trochanteric bursitis, right hip 06/23/2017   Acute right-sided low back pain with left-sided sciatica 03/24/2017   Pain in right hip 03/24/2017   Impingement syndrome of left shoulder 08/30/2016   Impingement syndrome of right shoulder 08/30/2016   Plantar fasciitis, bilateral 08/30/2016   Pelvic relaxation 04/13/2016    Class: Present on Admission   S/P hysterectomy 04/12/2016   Depression 11/16/2015   Generalized anxiety disorder 11/16/2015   Convulsion ([place]) 11/16/2015   SUI (stress urinary incontinence, female) 09/26/2012    Zachery Conch MOT, OTR/L  04/21/2021, 5:11 PM  [facility] [address] [place], [place], [address] Phone: [phone]   Fax:  [phone]  Name: Tammy [name] MRN: [id number] Date of Birth: [birth year]

## 2021-04-28 ENCOUNTER — Ambulatory Visit: Payer: 59 | Admitting: Occupational Therapy

## 2021-05-05 ENCOUNTER — Other Ambulatory Visit: Payer: Self-pay

## 2021-05-05 ENCOUNTER — Ambulatory Visit: Payer: 59 | Attending: Family Medicine | Admitting: Occupational Therapy

## 2021-05-05 DIAGNOSIS — M25532 Pain in left wrist: Secondary | ICD-10-CM | POA: Diagnosis present

## 2021-05-05 DIAGNOSIS — M25632 Stiffness of left wrist, not elsewhere classified: Secondary | ICD-10-CM

## 2021-05-05 DIAGNOSIS — M6281 Muscle weakness (generalized): Secondary | ICD-10-CM

## 2021-05-05 DIAGNOSIS — R600 Localized edema: Secondary | ICD-10-CM | POA: Diagnosis present

## 2021-05-05 NOTE — Therapy (Signed)
[facility] [address] [place], [place], [address] Phone: [phone]   Fax:  [phone]  Occupational Therapy Treatment  Patient Details  Name: Tammy [name] MRN: [id number] Date of Birth: 10/14/1960 Referring Provider (OT): Dr. Claudia Desanctis   Encounter Date: 05/05/2021   OT End of Session - 05/05/21 1458     Visit Number 4    Number of Visits 17    Date for OT Re-Evaluation 04/19/21    Authorization Type [employer]    OT Start Time 1450    OT Stop Time 8676    OT Time Calculation (min) 40 min             Past Medical History:  Diagnosis Date   Anxiety    Arthritis    neck and lower back - deg disc disease, fingers   Depression    Frequency of urination    GERD (gastroesophageal reflux disease)    History of seizure per pt no seizure since    11-07-2015  in setting of stress and sleep deprivation  /  negative work-up by neurologist unknown idiology   Hypothyroidism    Injury of thumb, left    03-31-2016     Pelvic relaxation    SUI (stress urinary incontinence, female)     Past Surgical History:  Procedure Laterality Date   ANTERIOR AND POSTERIOR REPAIR  09/25/2012   Procedure: ANTERIOR (CYSTOCELE) AND POSTERIOR REPAIR (RECTOCELE);  Surgeon: Lahoma Crocker, MD;  Location: [facility];  Service: Gynecology;  Laterality: N/A;   ANTERIOR AND POSTERIOR REPAIR WITH SACROSPINOUS FIXATION N/A 04/12/2016   Procedure: ANTERIOR AND POSTERIOR REPAIR WITH SACROSPINOUS LIGAMENT SUSPENSION, CYSTOSCOPY;  Surgeon: Arvella Nigh, MD;  Location: [facility];  Service: Gynecology;  Laterality: N/A;   APPENDECTOMY  1987   BREAST EXCISIONAL BIOPSY Left    No scar visable    BREAST REDUCTION SURGERY  2000  aprrox   ENDOMETRIAL ABLATION W/ NOVASURE  2011   in office   EXCISION LEFT BREAST BX  12-07-1999   benign   LAPAROSCOPIC CHOLECYSTECTOMY  02-09-2005   LAPAROSCOPIC VAGINAL HYSTERECTOMY WITH SALPINGO  OOPHORECTOMY Bilateral 04/12/2016   Procedure: LAPAROSCOPIC ASSISTED VAGINAL HYSTERECTOMY WITH SALPINGO OOPHORECTOMY;  Surgeon: Arvella Nigh, MD;  Location: [facility];  Service: Gynecology;  Laterality: Bilateral;   ORIF RADIAL FRACTURE Left 02/09/2021   Procedure: ORIF LEFT DISTAL RADIUS FRACTURE;  Surgeon: Cindra Presume, MD;  Location: [facility];  Service: Plastics;  Laterality: Left;  90 min   REDUCTION MAMMAPLASTY Bilateral    ROUX-EN-Y GASTRIC BYPASS  06/  2002   TUBAL LIGATION  10/ 1990    There were no vitals filed for this visit.   Subjective Assessment - 05/05/21 1548     Subjective  Pt requests d/c from therapy today.    Pertinent History s/p ORIF Lt distal radius fx 02/09/21. PMH: GERD, OA, Rt elbox fx (no surgery)    Limitations cleared for ROM per Dr Claudia Desanctis on 04/01/21, pt said MD told her she could d/c splint    Currently in Pain? Yes    Pain Score 2     Pain Location Wrist    Pain Orientation Left    Pain Descriptors / Indicators Aching    Pain Type Acute pain    Pain Onset More than a month ago    Pain Frequency Intermittent    Aggravating Factors  use    Pain Relieving Factors rest  Treatment: Paraffin x 10 mins to left hand and wrist for pain and stiffness, no adverse reactions. Therapist checked progress towards goals. A/ROM wrist flexion/ extension 60/60, supination/ pronation: 90/90                OT Education - 05/05/21 1600     Education Details Pt was instructed in updated HEP for P/ROM and strengthening, she returned demonstration. Therapsit checked progress towards goals as pt requests d/c.    Person(s) Educated Patient    Methods Explanation;Demonstration;Handout    Comprehension Verbalized understanding;Returned demonstration              OT Short Term Goals - 05/05/21 1458       OT SHORT TERM GOAL #1   Title Independent with splint wear and care    Time 2    Period Weeks    Status Achieved       OT SHORT TERM GOAL #2   Title Indpendent with initial HEP    Time 4    Period Weeks    Status Achieved      OT SHORT TERM GOAL #3   Title Pt to verbalize understanding with scar tissue massage and edema management strategies    Time 4    Period Weeks    Status Achieved      OT SHORT TERM GOAL #4   Title Pain Lt wrist to 4/10 or under with A/ROM HEP    Baseline 7/10 at rest    Time 4    Period Weeks    Status Achieved               OT Long Term Goals - 05/05/21 1522       OT LONG TERM GOAL #1   Title Independent with updated HEP    Time 8    Period Weeks    Status Achieved      OT LONG TERM GOAL #2   Title Pt to demo Lt wrist and forearm ROM WFL's    Time 8    Period Weeks    Status Achieved      OT LONG TERM GOAL #3   Title Lt grip strength to be 35 lbs or greater to assist with opening jars/containers    Time 8    Period Weeks    Status Not Met   19.8     OT LONG TERM GOAL #4   Title Pt to return to using hand as assist for light bilateral tasks and ADLS    Time 8    Period Weeks    Status Achieved                   Plan - 05/05/21 1510     Clinical Impression Statement Pt demonstrates good overall progress. She requests d/c from OT.    OT Occupational Profile and History Problem Focused Assessment - Including review of records relating to presenting problem    Occupational performance deficits (Please refer to evaluation for details): ADL's;IADL's;Leisure    Body Structure / Function / Physical Skills ADL;Strength;Dexterity;Pain;Edema;UE functional use;IADL;ROM;Sensation;Flexibility;FMC;Decreased knowledge of precautions    Rehab Potential Good    Clinical Decision Making Several treatment options, min-mod task modification necessary    Comorbidities Affecting Occupational Performance: May have comorbidities impacting occupational performance    Modification or Assistance to Complete Evaluation  No modification of tasks or assist  necessary to complete eval    OT Frequency 2x / week   anticipate only  1x/wk needed initially until pt can progress with more activity   OT Duration 8 weeks    OT Treatment/Interventions Self-care/ADL training;Moist Heat;Fluidtherapy;DME and/or AE instruction;Splinting;Contrast Bath;Compression bandaging;Therapeutic activities;Ultrasound;Therapeutic exercise;Scar mobilization;Cryotherapy;Passive range of motion;Electrical Stimulation;Paraffin;Manual Therapy;Patient/family education    Plan d/c OT per pt's request    Consulted and Agree with Plan of Care Patient             Patient will benefit from skilled therapeutic intervention in order to improve the following deficits and impairments:   Body Structure / Function / Physical Skills: ADL, Strength, Dexterity, Pain, Edema, UE functional use, IADL, ROM, Sensation, Flexibility, FMC, Decreased knowledge of precautions       Visit Diagnosis: Stiffness of left wrist, not elsewhere classified  Pain in left wrist  Muscle weakness (generalized)  Localized edema  OCCUPATIONAL THERAPY DISCHARGE SUMMARY    Current functional level related to goals / functional outcomes: Pt met all goals with the exception of her grip strength goal. Pt did not meet this goal as pt requests early d/c and plans to work on grip strength at home   Remaining deficits: Decreased strength, pain   Education / Equipment: Pt was educated regarding HEP. She demonstrates understanding.   Patient agrees to discharge. Patient goals were partially met. Patient is being discharged due to the patient's request..     Problem List Patient Active Problem List   Diagnosis Date Noted   Closed nondisplaced fracture of neck of right radius 07/21/2020   Somnolence    Aspiration pneumonia ([facility]) 02/20/2020   Lactic acidosis 27/04/8674   Acute metabolic encephalopathy 44/92/0100   Sepsis ([facility]) 02/20/2020   Chronic pain of both knees 01/08/2020   Encephalopathy acute  10/27/2019   Acute encephalopathy 10/27/2019   CAP (community acquired pneumonia) 07/31/2019   Atrial fibrillation with RVR ([facility]) 07/31/2019   Morbid obesity with BMI of 50.0-59.9, adult ([facility]) 07/31/2019   Respiratory failure ([facility]) 04/14/2019   Community acquired pneumonia 04/14/2019   Closed nondisplaced fracture of head of left radius 06/20/2018   Sprain of left wrist 06/20/2018   Peroneal tendinitis, right leg 06/23/2017   Trochanteric bursitis, right hip 06/23/2017   Acute right-sided low back pain with left-sided sciatica 03/24/2017   Pain in right hip 03/24/2017   Impingement syndrome of left shoulder 08/30/2016   Impingement syndrome of right shoulder 08/30/2016   Plantar fasciitis, bilateral 08/30/2016   Pelvic relaxation 04/13/2016    Class: Present on Admission   S/P hysterectomy 04/12/2016   Depression 11/16/2015   Generalized anxiety disorder 11/16/2015   Convulsion ([facility]) 11/16/2015   SUI (stress urinary incontinence, female) 09/26/2012    Manhattan Mccuen 05/05/2021, 4:02 PM  [facility] [address] [place], [place], [address] Phone: [phone]   Fax:  [phone]  Name: Tammy [name] MRN: [id number] Date of Birth: 04/14/1961

## 2021-05-05 NOTE — Patient Instructions (Addendum)
1. Grip Strengthening (Resistive Putty)   Squeeze putty using thumb and all fingers. Repeat _20___ times. Do __2__ sessions per day.   2. Roll putty into tube on table and pinch between each finger and thumb x 10 reps each. (can do ring and small finger together)      PROM: Wrist Flexion / Extension   Grasp  hand and slowly bend wrist until stretch is felt. Relax. Then stretch as far as possible in opposite direction. Be sure to keep elbow bent.  Hold __10__ sec. each way Repeat _5___ times per set.    Do _4-6___ sessions per day.  Pronation (Passive)   Keep elbow bent at right angle and held firmly to side. Use other hand to turn forearm until palm faces downward.then upwards Hold _10___ seconds. Repeat __5__ times. Do _4-6___ sessions per day.  WRIST: Extension (Weight)    Start with palm down, forearm resting on thigh. Holding weight, bend wrist to raise hand. Hold _5__ seconds. Use __1_ lb weight. __10_ reps per set, _1-2__ sets per day, _5__ days per week

## 2021-05-14 ENCOUNTER — Other Ambulatory Visit: Payer: Self-pay | Admitting: Plastic Surgery

## 2021-05-18 NOTE — Telephone Encounter (Signed)
Prescription denied.  Refill not appropriate.//AB/CMA

## 2021-05-27 ENCOUNTER — Ambulatory Visit: Payer: 59 | Admitting: Plastic Surgery

## 2021-07-29 ENCOUNTER — Other Ambulatory Visit: Payer: Self-pay

## 2021-07-29 ENCOUNTER — Encounter (HOSPITAL_BASED_OUTPATIENT_CLINIC_OR_DEPARTMENT_OTHER): Payer: Self-pay | Admitting: Obstetrics and Gynecology

## 2021-07-29 ENCOUNTER — Emergency Department (HOSPITAL_BASED_OUTPATIENT_CLINIC_OR_DEPARTMENT_OTHER)
Admission: EM | Admit: 2021-07-29 | Discharge: 2021-07-29 | Disposition: A | Discharge status: Home / Self Care | Payer: 59 | Attending: Emergency Medicine | Admitting: Emergency Medicine

## 2021-07-29 ENCOUNTER — Emergency Department (HOSPITAL_BASED_OUTPATIENT_CLINIC_OR_DEPARTMENT_OTHER): Payer: 59 | Admitting: Radiology

## 2021-07-29 DIAGNOSIS — J189 Pneumonia, unspecified organism: Secondary | ICD-10-CM | POA: Diagnosis not present

## 2021-07-29 DIAGNOSIS — N3001 Acute cystitis with hematuria: Secondary | ICD-10-CM | POA: Insufficient documentation

## 2021-07-29 DIAGNOSIS — Z20822 Contact with and (suspected) exposure to covid-19: Secondary | ICD-10-CM | POA: Diagnosis not present

## 2021-07-29 DIAGNOSIS — R0602 Shortness of breath: Secondary | ICD-10-CM

## 2021-07-29 LAB — URINALYSIS, ROUTINE W REFLEX MICROSCOPIC
Bilirubin Urine: NEGATIVE
Glucose, UA: NEGATIVE mg/dL
Ketones, ur: NEGATIVE mg/dL
Nitrite: POSITIVE — AB
Protein, ur: NEGATIVE mg/dL
Specific Gravity, Urine: 1.008 (ref 1.005–1.030)
pH: 6 (ref 5.0–8.0)

## 2021-07-29 LAB — COMPREHENSIVE METABOLIC PANEL
ALT: 9 U/L (ref 0–44)
AST: 16 U/L (ref 15–41)
Albumin: 3.9 g/dL (ref 3.5–5.0)
Alkaline Phosphatase: 69 U/L (ref 38–126)
Anion gap: 12 (ref 5–15)
BUN: 6 mg/dL (ref 6–20)
CO2: 27 mmol/L (ref 22–32)
Calcium: 8.9 mg/dL (ref 8.9–10.3)
Chloride: 101 mmol/L (ref 98–111)
Creatinine, Ser: 0.95 mg/dL (ref 0.44–1.00)
GFR, Estimated: >60 mL/min (ref >60)
Glucose, Bld: 91 mg/dL (ref 70–99)
Potassium: 3 mmol/L — ABNORMAL LOW (ref 3.5–5.1)
Sodium: 140 mmol/L (ref 135–145)
Total Bilirubin: 0.6 mg/dL (ref 0.3–1.2)
Total Protein: 6.9 g/dL (ref 6.5–8.1)

## 2021-07-29 LAB — RESP PANEL BY RT-PCR (FLU A&B, COVID) ARPGX2
Influenza A by PCR: NEGATIVE
Influenza B by PCR: NEGATIVE
SARS Coronavirus 2 by RT PCR: NEGATIVE

## 2021-07-29 LAB — CBC WITH DIFFERENTIAL/PLATELET
Abs Immature Granulocytes: 0.02 10*3/uL (ref 0.00–0.07)
Basophils Absolute: 0 10*3/uL (ref 0.0–0.1)
Basophils Relative: 1 %
Eosinophils Absolute: 0.1 10*3/uL (ref 0.0–0.5)
Eosinophils Relative: 1 %
HCT: 31.9 % — ABNORMAL LOW (ref 36.0–46.0)
Hemoglobin: 9.6 g/dL — ABNORMAL LOW (ref 12.0–15.0)
Immature Granulocytes: 0 %
Lymphocytes Relative: 35 %
Lymphs Abs: 2 10*3/uL (ref 0.7–4.0)
MCH: 22.7 pg — ABNORMAL LOW (ref 26.0–34.0)
MCHC: 30.1 g/dL (ref 30.0–36.0)
MCV: 75.4 fL — ABNORMAL LOW (ref 80.0–100.0)
Monocytes Absolute: 0.7 10*3/uL (ref 0.1–1.0)
Monocytes Relative: 12 %
Neutro Abs: 2.9 10*3/uL (ref 1.7–7.7)
Neutrophils Relative %: 51 %
Platelets: 318 10*3/uL (ref 150–400)
RBC: 4.23 MIL/uL (ref 3.87–5.11)
RDW: 16 % — ABNORMAL HIGH (ref 11.5–15.5)
WBC: 5.7 10*3/uL (ref 4.0–10.5)
nRBC: 0 % (ref 0.0–0.2)

## 2021-07-29 LAB — TROPONIN I (HIGH SENSITIVITY): Troponin I (High Sensitivity): 7 ng/L (ref <18)

## 2021-07-29 LAB — LACTIC ACID, PLASMA: Lactic Acid, Venous: 1.2 mmol/L (ref 0.5–1.9)

## 2021-07-29 MED ORDER — CLOTRIMAZOLE 1 % EX CREA: TOPICAL_CREAM | CUTANEOUS | 0 refills | Status: DC

## 2021-07-29 MED ORDER — CEFDINIR 300 MG PO CAPS: 300.0000 mg | ORAL_CAPSULE | Freq: Two times a day (BID) | ORAL | 0 refills | Status: AC

## 2021-07-29 MED ORDER — DOXYCYCLINE HYCLATE 100 MG PO CAPS: 100.0000 mg | ORAL_CAPSULE | Freq: Two times a day (BID) | ORAL | 0 refills | Status: DC

## 2021-07-29 MED ORDER — CLOTRIMAZOLE 1 % EX CREA
TOPICAL_CREAM | Freq: Two times a day (BID) | CUTANEOUS | Status: DC
  Filled 2021-07-29: qty 15

## 2021-07-29 MED ORDER — SODIUM CHLORIDE 0.9 % IV BOLUS
1000.0000 mL | Freq: Once | INTRAVENOUS | Status: AC
  Administered 2021-07-29: 1000 mL via INTRAVENOUS

## 2021-07-29 NOTE — ED Triage Notes (Signed)
Patient reports to the ER for shortness of breath, urinary problems, and feeling weak.Patient reports some diarrhea and nausea, denies emesis.

## 2021-07-29 NOTE — ED Notes (Addendum)
Unsuccessful attempt to obtain 2nd [facility]

## 2021-07-29 NOTE — ED Notes (Signed)
Pt states she does not want to wait for test results to come back.  She wants to go home and followup as needed. Cortni, PA-C advised and will discuss with patient prior to leaving.  Any outstanding orders to be cancelled.  Pt verbalized understanding of risks involved in leaving ED prior to tests resulting.

## 2021-07-29 NOTE — ED Provider Notes (Signed)
[name] EMERGENCY DEPT Provider Note   CSN: [id number] Arrival date & time: 07/29/21  1220     History Chief Complaint  Patient presents with   Shortness of Breath    Tammy [name] is a 60 y.o. female.  HPI  60 year old female with a history of anxiety, arthritis, depression, urinary frequency, GERD, seizures, hypothyroidism, who presents to the emergency department today for evaluation of multiple complaints.  She reports dyspnea exertion, increased fatigue, urinary symptoms and rash for the last week.  She is had no fevers at home.  She denies any chest pain, vomiting, diarrhea, or cough.   Past Medical History:  Diagnosis Date   Anxiety    Arthritis    neck and lower back - deg disc disease, fingers   Depression    Frequency of urination    GERD (gastroesophageal reflux disease)    History of seizure per pt no seizure since    11-07-2015  in setting of stress and sleep deprivation  /  negative work-up by neurologist unknown idiology   Hypothyroidism    Injury of thumb, left    03-31-2016     Pelvic relaxation    SUI (stress urinary incontinence, female)     Patient Active Problem List   Diagnosis Date Noted   Closed nondisplaced fracture of neck of right radius 07/21/2020   Somnolence    Aspiration pneumonia ([facility]) 02/20/2020   Lactic acidosis 14/48/1856   Acute metabolic encephalopathy 31/49/7026   Sepsis ([facility]) 02/20/2020   Chronic pain of both knees 01/08/2020   Encephalopathy acute 10/27/2019   Acute encephalopathy 10/27/2019   CAP (community acquired pneumonia) 07/31/2019   Atrial fibrillation with RVR ([facility]) 07/31/2019   Morbid obesity with BMI of 50.0-59.9, adult ([facility]) 07/31/2019   Respiratory failure ([facility]) 04/14/2019   Community acquired pneumonia 04/14/2019   Closed nondisplaced fracture of head of left radius 06/20/2018   Sprain of left wrist 06/20/2018   Peroneal tendinitis, right leg 06/23/2017   Trochanteric bursitis, right hip  06/23/2017   Acute right-sided low back pain with left-sided sciatica 03/24/2017   Pain in right hip 03/24/2017   Impingement syndrome of left shoulder 08/30/2016   Impingement syndrome of right shoulder 08/30/2016   Plantar fasciitis, bilateral 08/30/2016   Pelvic relaxation 04/13/2016    Class: Present on Admission   S/P hysterectomy 04/12/2016   Depression 11/16/2015   Generalized anxiety disorder 11/16/2015   Convulsion ([facility]) 11/16/2015   SUI (stress urinary incontinence, female) 09/26/2012    Past Surgical History:  Procedure Laterality Date   ANTERIOR AND POSTERIOR REPAIR  09/25/2012   Procedure: ANTERIOR (CYSTOCELE) AND POSTERIOR REPAIR (RECTOCELE);  Surgeon: Lahoma Crocker, MD;  Location: [facility];  Service: Gynecology;  Laterality: N/A;   ANTERIOR AND POSTERIOR REPAIR WITH SACROSPINOUS FIXATION N/A 04/12/2016   Procedure: ANTERIOR AND POSTERIOR REPAIR WITH SACROSPINOUS LIGAMENT SUSPENSION, CYSTOSCOPY;  Surgeon: Arvella Nigh, MD;  Location: [facility];  Service: Gynecology;  Laterality: N/A;   APPENDECTOMY  1987   BREAST EXCISIONAL BIOPSY Left    No scar visable    BREAST REDUCTION SURGERY  2000  aprrox   ENDOMETRIAL ABLATION W/ NOVASURE  2011   in office   EXCISION LEFT BREAST BX  12-07-1999   benign   LAPAROSCOPIC CHOLECYSTECTOMY  02-09-2005   LAPAROSCOPIC VAGINAL HYSTERECTOMY WITH SALPINGO OOPHORECTOMY Bilateral 04/12/2016   Procedure: LAPAROSCOPIC ASSISTED VAGINAL HYSTERECTOMY WITH SALPINGO OOPHORECTOMY;  Surgeon: Arvella Nigh, MD;  Location: [facility];  Service: Gynecology;  Laterality: Bilateral;   ORIF RADIAL FRACTURE Left 02/09/2021   Procedure: ORIF LEFT DISTAL RADIUS FRACTURE;  Surgeon: Cindra Presume, MD;  Location: [facility];  Service: Plastics;  Laterality: Left;  90 min   REDUCTION MAMMAPLASTY Bilateral    ROUX-EN-Y GASTRIC BYPASS  06/  2002   TUBAL LIGATION  10/ 1990     OB History   No obstetric history on file.     Family  History  Problem Relation Age of Onset   Colon polyps Mother    Heart disease Father 68       CABG   Breast cancer Maternal Aunt    Stomach cancer Maternal Grandmother    Diabetes Maternal Grandmother    Heart disease Maternal Grandmother    Stomach cancer Maternal Uncle    Colon cancer Maternal Uncle        x3   Diabetes Maternal Aunt        Type I   Diabetes Maternal Uncle        x5 - Type II   Heart disease Paternal Grandmother    Esophageal cancer Neg Hx    Gallbladder disease Neg Hx    Seizures Neg Hx     Social History   Tobacco Use   Smoking status: Never   Smokeless tobacco: Never  Vaping Use   Vaping Use: Never used  Substance Use Topics   Alcohol use: No    Comment: drinks daily - none since 08/2012 per pt.   Drug use: No    Home Medications Prior to Admission medications   Medication Sig Start Date End Date Taking? Authorizing Provider  cefdinir (OMNICEF) 300 MG capsule Take 1 capsule (300 mg total) by mouth 2 (two) times daily for 7 days. 07/29/21 08/05/21 Yes Bandy Honaker S, PA-C  clotrimazole (LOTRIMIN) 1 % cream Apply to affected area 2 times daily 07/29/21  Yes Avin Upperman S, PA-C  doxycycline (VIBRAMYCIN) 100 MG capsule Take 1 capsule (100 mg total) by mouth 2 (two) times daily for 7 days. 07/29/21 08/05/21 Yes Jaunice Mirza S, PA-C  buPROPion (WELLBUTRIN XL) 150 MG 24 hr tablet Take 450 mg by mouth daily. 12/17/19   [provider]  celecoxib (CELEBREX) 200 MG capsule Take 1 capsule (200 mg total) by mouth 2 (two) times daily. 03/19/21   Cindra Presume, MD  clonazePAM (KLONOPIN) 1 MG tablet Take 1 mg by mouth 3 (three) times daily as needed for anxiety.  03/24/20   [provider]  ELIQUIS 5 MG TABS tablet TAKE ONE TABLET BY MOUTH TWICE A DAY 03/18/21   Minus Breeding, MD  FLUoxetine (PROZAC) 20 MG capsule Take 20 mg by mouth at bedtime.  07/26/18   [provider]  gabapentin (NEURONTIN) 600 MG tablet Take 1,200 mg by  mouth 3 (three) times daily.    [provider]  HYDROcodone-acetaminophen (NORCO) 5-325 MG tablet Take 1 tablet by mouth every 6 (six) hours as needed for moderate pain. 02/25/21   Cindra Presume, MD  KLOR-CON M20 20 MEQ tablet TAKE ONE TABLET BY MOUTH DAILY Patient taking differently: Take 20 mEq by mouth daily. 01/12/21   Sherran Needs, NP  Melatonin 10 MG TABS Take 10 mg by mouth at bedtime as needed (sleep).    [provider]  metoprolol succinate (TOPROL-XL) 25 MG 24 hr tablet Take 1 tablet (25 mg total) by mouth at bedtime. 03/27/21   Minus Breeding, MD  OLANZapine (ZYPREXA) 10 MG tablet Take  10 mg by mouth at bedtime. 04/04/19   [provider]  ondansetron (ZOFRAN) 4 MG tablet Take 1 tablet (4 mg total) by mouth every 8 (eight) hours as needed for nausea or vomiting. 02/09/21   Scheeler, Carola Rhine, PA-C    Allergies    Nsaids, Adhesive [tape], Latex, and Sulfa antibiotics  Review of Systems   Review of Systems  Constitutional:  Positive for fatigue. Negative for fever.  HENT:  Negative for ear pain and sore throat.   Eyes:  Negative for visual disturbance.  Respiratory:  Positive for shortness of breath. Negative for cough.   Cardiovascular:  Negative for chest pain.  Gastrointestinal:  Positive for nausea. Negative for abdominal pain, diarrhea and vomiting.  Genitourinary:  Positive for dysuria and frequency. Negative for hematuria.  Musculoskeletal:  Negative for back pain.  Skin:  Positive for rash.  Neurological:  Negative for headaches.  All other systems reviewed and are negative.  Physical Exam Updated Vital Signs BP (!) 101/55 (BP Location: Right Arm)   Pulse 62   Temp 99.1 F (37.3 C) (Oral)   Resp 20   LMP 09/30/2012   SpO2 100%   Physical Exam Vitals and nursing note reviewed.  Constitutional:      General: She is not in acute distress.    Appearance: She is well-developed.  HENT:     Head: Normocephalic and atraumatic.  Eyes:      Conjunctiva/sclera: Conjunctivae normal.  Cardiovascular:     Rate and Rhythm: Normal rate and regular rhythm.     Heart sounds: Normal heart sounds. No murmur heard. Pulmonary:     Effort: Pulmonary effort is normal. No respiratory distress.     Breath sounds: Normal breath sounds. No decreased breath sounds, wheezing, rhonchi or rales.  Abdominal:     Palpations: Abdomen is soft.     Tenderness: There is no abdominal tenderness. There is no guarding or rebound.  Genitourinary:    Comments: Chaperone present. Intertrigo noted to the groin area Musculoskeletal:     Cervical back: Neck supple.  Skin:    General: Skin is warm and dry.  Neurological:     Mental Status: She is alert.    ED Results / Procedures / Treatments   Labs (all labs ordered are listed, but only abnormal results are displayed) Labs Reviewed  CBC WITH DIFFERENTIAL/PLATELET - Abnormal; Notable for the following components:      Result Value   Hemoglobin 9.6 (*)    HCT 31.9 (*)    MCV 75.4 (*)    MCH 22.7 (*)    RDW 16.0 (*)    All other components within normal limits  COMPREHENSIVE METABOLIC PANEL - Abnormal; Notable for the following components:   Potassium 3.0 (*)    All other components within normal limits  URINALYSIS, ROUTINE W REFLEX MICROSCOPIC - Abnormal; Notable for the following components:   APPearance HAZY (*)    Hgb urine dipstick SMALL (*)    Nitrite POSITIVE (*)    Leukocytes,Ua LARGE (*)    Bacteria, UA MANY (*)    All other components within normal limits  RESP PANEL BY RT-PCR (FLU A&B, COVID) ARPGX2  CULTURE, BLOOD (ROUTINE X 2)  LACTIC ACID, PLASMA  CBG MONITORING, ED  TROPONIN I (HIGH SENSITIVITY)    EKG EKG Interpretation  Date/Time:  Wednesday July 29 2021 12:31:22 EDT Ventricular Rate:  73 PR Interval:  130 QRS Duration: 96 QT Interval:  430 QTC Calculation: 473 R Axis:  26 Text Interpretation: Normal sinus rhythm Cannot rule out Anterior infarct , age  undetermined Abnormal ECG overall similar to Mar 2022 Confirmed by Sherwood Gambler [phone]) on 07/29/2021 12:38:48 PM  Radiology DG Chest Port 1 View  Result Date: 07/29/2021 CLINICAL DATA:  Shortness of breath, weakness EXAM: PORTABLE CHEST 1 VIEW COMPARISON:  Chest radiograph 12/22/2020 FINDINGS: The heart is enlarged, unchanged. The mediastinal contours are stable. There are increased patchy opacities in the right lower lobe. The lungs are otherwise clear. There is no pulmonary edema. There is no significant pleural effusion. There is no pneumothorax. There is no acute osseous abnormality. IMPRESSION: Patchy opacities in the right base could reflect infection in the correct clinical setting. Recommend follow-up radiographs in 6-8 weeks to assess for resolution. Electronically Signed   By: Valetta Mole M.D.   On: 07/29/2021 13:45    Procedures Procedures   Medications Ordered in ED Medications  sodium chloride 0.9 % bolus 1,000 mL (0 mLs Intravenous Stopped 07/29/21 1415)    ED Course  I have reviewed the triage vital signs and the nursing notes.  Pertinent labs & imaging results that were available during my care of the patient were reviewed by me and considered in my medical decision making (see chart for details).    MDM Rules/Calculators/A&P                          60 y/o F presents for eval of multiple complaints including uti sxs, sob and rash.  Reviewed/interpreted labs CBC w/o leukocytosis, anemia present and stable from baseline CMP with mild hypokalemia, she is chronically on potassium, otherwise reassuring  UA consistent with UTI, culture sent. Rx given Lactic neg Blood cultures obtained COVID neg Trop - pt left the ED prior to this resulting as she stated she needed to get home to her husband. She understands the risk of being discharged prior to this result.   - result negative  EKG Normal sinus rhythm Cannot rule out Anterior infarct , age undetermined Abnormal  ECG overall similar to Mar 2022  Reviewed/interpreted imaging CXR -  Patchy opacities in the right base could reflect infection in the correct clinical setting. Recommend follow-up radiographs in 6-8 weeks to assess for resolution.  Pt attempting to leave AMA. She was advised to stay to wait for the remainder of her results however she declined. At the time of d/c she was noted to have uti and pna. We will give rx for abx for home. She is advised to f/u with here pcp and return if worse.   Final Clinical Impression(s) / ED Diagnoses Final diagnoses:  Community acquired pneumonia, unspecified laterality  Acute cystitis with hematuria    Rx / DC Orders ED Discharge Orders          Ordered    cefdinir (OMNICEF) 300 MG capsule  2 times daily        07/29/21 1454    doxycycline (VIBRAMYCIN) 100 MG capsule  2 times daily        07/29/21 1454    clotrimazole (LOTRIMIN) 1 % cream        07/29/21 [address] Avianah Pellman S, PA-C 07/29/21 2001    Sherwood Gambler, MD 07/30/21 [phone]

## 2021-07-29 NOTE — Discharge Instructions (Signed)
You were given a prescription for antibiotics. Please take the antibiotic prescription fully.   A culture was sent of your urine today to determine if there is any bacterial growth. If the results of the culture are positive and you require an antibiotic or a change of your prescribed antibiotic you will be contacted by the hospital. If the results are negative you will not be contacted.  Please follow up with your primary care provider within 5-7 days for re-evaluation of your symptoms. If you do not have a primary care provider, information for a healthcare clinic has been provided for you to make arrangements for follow up care. Please return to the emergency department for any new or worsening symptoms.

## 2021-07-30 ENCOUNTER — Telehealth (HOSPITAL_BASED_OUTPATIENT_CLINIC_OR_DEPARTMENT_OTHER): Payer: Self-pay | Admitting: Emergency Medicine

## 2021-07-30 NOTE — Telephone Encounter (Signed)
Received call from [facility]lab with positive blood culture. Consulted with Dr. Florina Ou who advised pt was sent home with antibiotics. No further action necessary at this time until sensitivity results.

## 2021-08-01 LAB — CULTURE, BLOOD (ROUTINE X 2): Special Requests: ADEQUATE

## 2021-08-02 NOTE — Progress Notes (Signed)
ED Antimicrobial Stewardship Positive Culture Follow Up   Tammy [name] is an 60 y.o. female who presented to [facility] on 07/29/2021 with a chief complaint of  Chief Complaint  Patient presents with   Shortness of Breath    Recent Results (from the past 720 hour(s))  Blood culture (routine x 2)     Status: Abnormal   Collection Time: 07/29/21  1:19 PM   Specimen: BLOOD LEFT WRIST  Result Value Ref Range Status   Specimen Description   Final    BLOOD LEFT WRIST Performed at [facility], [address] [place], [place] [address]    Special Requests   Final    BOTTLES DRAWN AEROBIC AND ANAEROBIC Blood Culture adequate volume   Culture  Setup Time   Final    GRAM POSITIVE RODS ANAEROBIC BOTTLE ONLY CRITICAL RESULT CALLED TO, READ BACK BY AND VERIFIED WITH: Ewell Poe RN [phone] 07/30/2021 T. TYSOR    Culture (A)  Final    CLOSTRIDIUM PERFRINGENS Standardized susceptibility testing for this organism is not available. Performed at [facility], [place] [address] [place],  [address]    Report Status 08/01/2021 FINAL  Final  Resp Panel by RT-PCR (Flu A&B, Covid) Nasopharyngeal Swab     Status: None   Collection Time: 07/29/21  1:26 PM   Specimen: Nasopharyngeal Swab; Nasopharyngeal(NP) swabs in vial transport medium  Result Value Ref Range Status   SARS Coronavirus 2 by RT PCR NEGATIVE NEGATIVE Final    Comment: (NOTE) SARS-CoV-2 target nucleic acids are NOT DETECTED.  The SARS-CoV-2 RNA is generally detectable in upper respiratory specimens during the acute phase of infection. The lowest concentration of SARS-CoV-2 viral copies this assay can detect is 138 copies/mL. A negative result does not preclude SARS-Cov-2 infection and should not be used as the sole basis for treatment or other patient management decisions. A negative result may occur with  improper specimen collection/handling, submission of specimen other than nasopharyngeal swab,  presence of viral mutation(s) within the areas targeted by this assay, and inadequate number of viral copies(<138 copies/mL). A negative result must be combined with clinical observations, patient history, and epidemiological information. The expected result is Negative.  Fact Sheet for Patients:  EntrepreneurPulse.com.au  Fact Sheet for Healthcare Providers:  IncredibleEmployment.be  This test is no t yet approved or cleared by the [place] FDA and  has been authorized for detection and/or diagnosis of SARS-CoV-2 by FDA under an Emergency Use Authorization (EUA). This EUA will remain  in effect (meaning this test can be used) for the duration of the COVID-19 declaration under Section 564(b)(1) of the Act, 21 U.S.C.section 360bbb-3(b)(1), unless the authorization is terminated  or revoked sooner.       Influenza A by PCR NEGATIVE NEGATIVE Final   Influenza B by PCR NEGATIVE NEGATIVE Final    Comment: (NOTE) The Xpert Xpress SARS-CoV-2/FLU/RSV plus assay is intended as an aid in the diagnosis of influenza from Nasopharyngeal swab specimens and should not be used as a sole basis for treatment. Nasal washings and aspirates are unacceptable for Xpert Xpress SARS-CoV-2/FLU/RSV testing.  Fact Sheet for Patients: EntrepreneurPulse.com.au  Fact Sheet for Healthcare Providers: IncredibleEmployment.be  This test is not yet approved or cleared by the [place] FDA and has been authorized for detection and/or diagnosis of SARS-CoV-2 by FDA under an Emergency Use Authorization (EUA). This EUA will remain in effect (meaning this test can be used) for the duration of the COVID-19 declaration  under Section 564(b)(1) of the Act, 21 U.S.C. section 360bbb-3(b)(1), unless the authorization is terminated or revoked.  Performed at [facility], [address] [place], [place] [address]      [x]  Treated with Cefidnir, organism resistant to prescribed antimicrobial []  Patient discharged originally without antimicrobial agent and treatment is now indicated  New antibiotic prescription: Patient is positive for blood culture 1/4 bottles with Clostridium. Per Dr. Kathrynn Humble, EDP, patient to be called to be assessed and return to ED for re-assessment and re-draw blood cx. ED Charge RN, Gilberto Better, attempted x2 and left message so far but no return call yet.   ED Provider: Dr. Kathrynn Humble, MD  Joetta Manners, PharmD, [facility] Emergency Medicine Clinical Pharmacist ED RPh Phone: [place]: [phone]

## 2021-08-03 ENCOUNTER — Emergency Department (HOSPITAL_BASED_OUTPATIENT_CLINIC_OR_DEPARTMENT_OTHER)
Admission: EM | Admit: 2021-08-03 | Discharge: 2021-08-03 | Disposition: A | Discharge status: Home / Self Care | Payer: 59 | Attending: Emergency Medicine | Admitting: Emergency Medicine

## 2021-08-03 ENCOUNTER — Encounter (HOSPITAL_BASED_OUTPATIENT_CLINIC_OR_DEPARTMENT_OTHER): Payer: Self-pay | Admitting: Emergency Medicine

## 2021-08-03 ENCOUNTER — Other Ambulatory Visit: Payer: Self-pay

## 2021-08-03 DIAGNOSIS — E876 Hypokalemia: Secondary | ICD-10-CM

## 2021-08-03 DIAGNOSIS — B967 Clostridium perfringens [C. perfringens] as the cause of diseases classified elsewhere: Secondary | ICD-10-CM | POA: Diagnosis not present

## 2021-08-03 DIAGNOSIS — R799 Abnormal finding of blood chemistry, unspecified: Secondary | ICD-10-CM | POA: Diagnosis not present

## 2021-08-03 DIAGNOSIS — Z91040 Latex allergy status: Secondary | ICD-10-CM | POA: Insufficient documentation

## 2021-08-03 DIAGNOSIS — E039 Hypothyroidism, unspecified: Secondary | ICD-10-CM | POA: Insufficient documentation

## 2021-08-03 DIAGNOSIS — Z79899 Other long term (current) drug therapy: Secondary | ICD-10-CM | POA: Diagnosis not present

## 2021-08-03 LAB — COMPREHENSIVE METABOLIC PANEL
ALT: 10 U/L (ref 0–44)
AST: 13 U/L — ABNORMAL LOW (ref 15–41)
Albumin: 4 g/dL (ref 3.5–5.0)
Alkaline Phosphatase: 68 U/L (ref 38–126)
Anion gap: 9 (ref 5–15)
BUN: 13 mg/dL (ref 6–20)
CO2: 30 mmol/L (ref 22–32)
Calcium: 9.3 mg/dL (ref 8.9–10.3)
Chloride: 101 mmol/L (ref 98–111)
Creatinine, Ser: 0.81 mg/dL (ref 0.44–1.00)
GFR, Estimated: >60 mL/min (ref >60)
Glucose, Bld: 71 mg/dL (ref 70–99)
Potassium: 3.2 mmol/L — ABNORMAL LOW (ref 3.5–5.1)
Sodium: 140 mmol/L (ref 135–145)
Total Bilirubin: 0.5 mg/dL (ref 0.3–1.2)
Total Protein: 7 g/dL (ref 6.5–8.1)

## 2021-08-03 LAB — CBC WITH DIFFERENTIAL/PLATELET
Abs Immature Granulocytes: 0.01 10*3/uL (ref 0.00–0.07)
Basophils Absolute: 0 10*3/uL (ref 0.0–0.1)
Basophils Relative: 1 %
Eosinophils Absolute: 0.1 10*3/uL (ref 0.0–0.5)
Eosinophils Relative: 1 %
HCT: 33.7 % — ABNORMAL LOW (ref 36.0–46.0)
Hemoglobin: 9.7 g/dL — ABNORMAL LOW (ref 12.0–15.0)
Immature Granulocytes: 0 %
Lymphocytes Relative: 31 %
Lymphs Abs: 2 10*3/uL (ref 0.7–4.0)
MCH: 22.2 pg — ABNORMAL LOW (ref 26.0–34.0)
MCHC: 28.8 g/dL — ABNORMAL LOW (ref 30.0–36.0)
MCV: 77.1 fL — ABNORMAL LOW (ref 80.0–100.0)
Monocytes Absolute: 0.5 10*3/uL (ref 0.1–1.0)
Monocytes Relative: 8 %
Neutro Abs: 3.8 10*3/uL (ref 1.7–7.7)
Neutrophils Relative %: 59 %
Platelets: 365 10*3/uL (ref 150–400)
RBC: 4.37 MIL/uL (ref 3.87–5.11)
RDW: 16.4 % — ABNORMAL HIGH (ref 11.5–15.5)
WBC: 6.5 10*3/uL (ref 4.0–10.5)
nRBC: 0 % (ref 0.0–0.2)

## 2021-08-03 MED ORDER — POTASSIUM CHLORIDE CRYS ER 20 MEQ PO TBCR
40.0000 meq | EXTENDED_RELEASE_TABLET | Freq: Once | ORAL | Status: AC
  Administered 2021-08-03: 40 meq via ORAL
  Filled 2021-08-03: qty 2

## 2021-08-03 MED ORDER — METRONIDAZOLE 500 MG PO TABS: 500.0000 mg | ORAL_TABLET | Freq: Three times a day (TID) | ORAL | 0 refills | Status: AC

## 2021-08-03 MED ORDER — METRONIDAZOLE 500 MG PO TABS: 500.0000 mg | ORAL_TABLET | Freq: Three times a day (TID) | ORAL | 0 refills | Status: DC

## 2021-08-03 NOTE — ED Provider Notes (Signed)
[facility] EMERGENCY DEPT Provider Note   CSN: [id number] Arrival date & time: 08/03/21  1129     History Chief Complaint  Patient presents with   Abnormal Lab    Tammy [name] is a 60 y.o. female.  HPI 60 year old female presents with positive blood cultures.  She was called yesterday and told that she was on the wrong antibiotic and needed to come back into the hospital.  She was dealing with a UTI and was told she had pneumonia though she states she never had pneumonia symptoms.  The urine symptoms are better.  She also had a groin rash that has resolved.  Right now she feels well.  She denies vomiting or diarrhea though sometimes has some indigestion.  Her blood cultures Clostridium perfringens.  Past Medical History:  Diagnosis Date   Anxiety    Arthritis    neck and lower back - deg disc disease, fingers   Depression    Frequency of urination    GERD (gastroesophageal reflux disease)    History of seizure per pt no seizure since    11-07-2015  in setting of stress and sleep deprivation  /  negative work-up by neurologist unknown idiology   Hypothyroidism    Injury of thumb, left    03-31-2016     Pelvic relaxation    SUI (stress urinary incontinence, female)     Patient Active Problem List   Diagnosis Date Noted   Closed nondisplaced fracture of neck of right radius 07/21/2020   Somnolence    Aspiration pneumonia ([place]) 02/20/2020   Lactic acidosis 40/98/1191   Acute metabolic encephalopathy 47/82/9562   Sepsis ([place]) 02/20/2020   Chronic pain of both knees 01/08/2020   Encephalopathy acute 10/27/2019   Acute encephalopathy 10/27/2019   CAP (community acquired pneumonia) 07/31/2019   Atrial fibrillation with RVR ([place]) 07/31/2019   Morbid obesity with BMI of 50.0-59.9, adult ([place]) 07/31/2019   Respiratory failure ([place]) 04/14/2019   Community acquired pneumonia 04/14/2019   Closed nondisplaced fracture of head of left radius 06/20/2018   Sprain  of left wrist 06/20/2018   Peroneal tendinitis, right leg 06/23/2017   Trochanteric bursitis, right hip 06/23/2017   Acute right-sided low back pain with left-sided sciatica 03/24/2017   Pain in right hip 03/24/2017   Impingement syndrome of left shoulder 08/30/2016   Impingement syndrome of right shoulder 08/30/2016   Plantar fasciitis, bilateral 08/30/2016   Pelvic relaxation 04/13/2016    Class: Present on Admission   S/P hysterectomy 04/12/2016   Depression 11/16/2015   Generalized anxiety disorder 11/16/2015   Convulsion ([place]) 11/16/2015   SUI (stress urinary incontinence, female) 09/26/2012    Past Surgical History:  Procedure Laterality Date   ANTERIOR AND POSTERIOR REPAIR  09/25/2012   Procedure: ANTERIOR (CYSTOCELE) AND POSTERIOR REPAIR (RECTOCELE);  Surgeon: Lahoma Crocker, MD;  Location: [facility];  Service: Gynecology;  Laterality: N/A;   ANTERIOR AND POSTERIOR REPAIR WITH SACROSPINOUS FIXATION N/A 04/12/2016   Procedure: ANTERIOR AND POSTERIOR REPAIR WITH SACROSPINOUS LIGAMENT SUSPENSION, CYSTOSCOPY;  Surgeon: Arvella Nigh, MD;  Location: [facility];  Service: Gynecology;  Laterality: N/A;   APPENDECTOMY  1987   BREAST EXCISIONAL BIOPSY Left    No scar visable    BREAST REDUCTION SURGERY  2000  aprrox   ENDOMETRIAL ABLATION W/ NOVASURE  2011   in office   EXCISION LEFT BREAST BX  12-07-1999   benign   LAPAROSCOPIC CHOLECYSTECTOMY  02-09-2005   LAPAROSCOPIC VAGINAL HYSTERECTOMY WITH SALPINGO  OOPHORECTOMY Bilateral 04/12/2016   Procedure: LAPAROSCOPIC ASSISTED VAGINAL HYSTERECTOMY WITH SALPINGO OOPHORECTOMY;  Surgeon: Arvella Nigh, MD;  Location: [facility];  Service: Gynecology;  Laterality: Bilateral;   ORIF RADIAL FRACTURE Left 02/09/2021   Procedure: ORIF LEFT DISTAL RADIUS FRACTURE;  Surgeon: Cindra Presume, MD;  Location: [facility];  Service: Plastics;  Laterality: Left;  90 min   REDUCTION MAMMAPLASTY Bilateral    ROUX-EN-Y GASTRIC BYPASS   06/  2002   TUBAL LIGATION  10/ 1990     OB History   No obstetric history on file.     Family History  Problem Relation Age of Onset   Colon polyps Mother    Heart disease Father 31       CABG   Breast cancer Maternal Aunt    Stomach cancer Maternal Grandmother    Diabetes Maternal Grandmother    Heart disease Maternal Grandmother    Stomach cancer Maternal Uncle    Colon cancer Maternal Uncle        x3   Diabetes Maternal Aunt        Type I   Diabetes Maternal Uncle        x5 - Type II   Heart disease Paternal Grandmother    Esophageal cancer Neg Hx    Gallbladder disease Neg Hx    Seizures Neg Hx     Social History   Tobacco Use   Smoking status: Never   Smokeless tobacco: Never  Vaping Use   Vaping Use: Never used  Substance Use Topics   Alcohol use: No    Comment: drinks daily - none since 08/2012 per pt.   Drug use: No    Home Medications Prior to Admission medications   Medication Sig Start Date End Date Taking? Authorizing Provider  metroNIDAZOLE (FLAGYL) 500 MG tablet Take 1 tablet (500 mg total) by mouth 3 (three) times daily for 7 days. 08/03/21 08/10/21 Yes Sherwood Gambler, MD  buPROPion (WELLBUTRIN XL) 150 MG 24 hr tablet Take 450 mg by mouth daily. 12/17/19   [provider]  cefdinir (OMNICEF) 300 MG capsule Take 1 capsule (300 mg total) by mouth 2 (two) times daily for 7 days. 07/29/21 08/05/21  Couture, Cortni S, PA-C  celecoxib (CELEBREX) 200 MG capsule Take 1 capsule (200 mg total) by mouth 2 (two) times daily. 03/19/21   Cindra Presume, MD  clonazePAM (KLONOPIN) 1 MG tablet Take 1 mg by mouth 3 (three) times daily as needed for anxiety.  03/24/20   [provider]  clotrimazole (LOTRIMIN) 1 % cream Apply to affected area 2 times daily 07/29/21   Couture, Cortni S, PA-C  ELIQUIS 5 MG TABS tablet TAKE ONE TABLET BY MOUTH TWICE A DAY 03/18/21   Minus Breeding, MD  FLUoxetine (PROZAC) 20 MG capsule Take 20 mg by mouth at bedtime.   07/26/18   [provider]  gabapentin (NEURONTIN) 600 MG tablet Take 1,200 mg by mouth 3 (three) times daily.    [provider]  HYDROcodone-acetaminophen (NORCO) 5-325 MG tablet Take 1 tablet by mouth every 6 (six) hours as needed for moderate pain. 02/25/21   Cindra Presume, MD  KLOR-CON M20 20 MEQ tablet TAKE ONE TABLET BY MOUTH DAILY Patient taking differently: Take 20 mEq by mouth daily. 01/12/21   Sherran Needs, NP  Melatonin 10 MG TABS Take 10 mg by mouth at bedtime as needed (sleep).    [provider]  metoprolol succinate (TOPROL-XL) 25  MG 24 hr tablet Take 1 tablet (25 mg total) by mouth at bedtime. 03/27/21   Minus Breeding, MD  OLANZapine (ZYPREXA) 10 MG tablet Take 10 mg by mouth at bedtime. 04/04/19   [provider]  ondansetron (ZOFRAN) 4 MG tablet Take 1 tablet (4 mg total) by mouth every 8 (eight) hours as needed for nausea or vomiting. 02/09/21   Scheeler, Carola Rhine, PA-C    Allergies    Nsaids, Adhesive [tape], Latex, and Sulfa antibiotics  Review of Systems   Review of Systems  Constitutional:  Negative for fever.  Respiratory:  Negative for cough and shortness of breath.   Gastrointestinal:  Positive for nausea. Negative for abdominal pain, diarrhea and vomiting.  Genitourinary:  Negative for dysuria.  Musculoskeletal:  Negative for myalgias.  Skin:  Negative for rash.  All other systems reviewed and are negative.  Physical Exam Updated Vital Signs BP 140/68 (BP Location: Right Arm)   Pulse 86   Temp 98 F (36.7 C) (Oral)   Resp 18   Ht 5\' 5"  (1.651 m)   Wt 129.3 kg   LMP 09/30/2012   SpO2 99%   BMI 47.43 kg/m   Physical Exam Vitals and nursing note reviewed.  Constitutional:      Appearance: She is well-developed.  HENT:     Head: Normocephalic and atraumatic.     Right Ear: External ear normal.     Left Ear: External ear normal.     Nose: Nose normal.  Eyes:     General:        Right eye: No discharge.         Left eye: No discharge.  Cardiovascular:     Rate and Rhythm: Normal rate and regular rhythm.     Heart sounds: Normal heart sounds.  Pulmonary:     Effort: Pulmonary effort is normal.     Breath sounds: Normal breath sounds.  Abdominal:     Palpations: Abdomen is soft.     Tenderness: There is no abdominal tenderness.  Genitourinary:    Comments: Deferred by patient Skin:    General: Skin is warm and dry.  Neurological:     Mental Status: She is alert.  Psychiatric:        Mood and Affect: Mood is not anxious.    ED Results / Procedures / Treatments   Labs (all labs ordered are listed, but only abnormal results are displayed) Labs Reviewed  COMPREHENSIVE METABOLIC PANEL - Abnormal; Notable for the following components:      Result Value   Potassium 3.2 (*)    AST 13 (*)    All other components within normal limits  CBC WITH DIFFERENTIAL/PLATELET - Abnormal; Notable for the following components:   Hemoglobin 9.7 (*)    HCT 33.7 (*)    MCV 77.1 (*)    MCH 22.2 (*)    MCHC 28.8 (*)    RDW 16.4 (*)    All other components within normal limits  CULTURE, BLOOD (ROUTINE X 2)  CULTURE, BLOOD (ROUTINE X 2)    EKG None  Radiology No results found.  Procedures Procedures   Medications Ordered in ED Medications  potassium chloride SA (KLOR-CON) CR tablet 40 mEq (has no administration in time range)    ED Course  I have reviewed the triage vital signs and the nursing notes.  Pertinent labs & imaging results that were available during my care of the patient were reviewed by me and considered in  my medical decision making (see chart for details).    MDM Rules/Calculators/A&P                           I discussed the positive blood culture with Dr. Baxter Flattery of infectious disease.  Given that the patient is well-appearing she does advise repeat blood cultures but it would be reasonable to treat with Flagyl 3 times daily for 2 weeks.  Can follow-up with PCP.  She did  have a rash last time she was here but it sounds like that was a yeast infection and the patient states this cleared up.  She defers GU exam to evaluate this.  However no muscle/skin symptoms currently.  No GI symptoms.  Labs are stable besides mild hypokalemia, which is a chronic problem for her.  Will discharge with return precautions. Final Clinical Impression(s) / ED Diagnoses Final diagnoses:  Clostridium perfringens infection  Hypokalemia    Rx / DC Orders ED Discharge Orders          Ordered    metroNIDAZOLE (FLAGYL) 500 MG tablet  3 times daily        08/03/21 1510             Sherwood Gambler, MD 08/03/21 1548

## 2021-08-03 NOTE — ED Triage Notes (Signed)
Diagnosed with pneumonia last week. Told to return due to positive blood cultures.

## 2021-08-03 NOTE — Discharge Instructions (Signed)
Your blood cultures from 5 days ago grew out an organism called Clostridium perfringens.  You need to be on Flagyl for 2 weeks.  Call your doctor in follow-up in the meantime.  If at any point you develop fever, rash or muscle pain, diarrhea or vomiting, or any other new/concerning symptoms then return to the ER for evaluation.

## 2021-08-03 NOTE — ED Notes (Signed)
Patient verbalizes understanding of discharge instructions. Opportunity for questioning and answers were provided. Patient discharged from ED.  °

## 2021-08-06 ENCOUNTER — Other Ambulatory Visit (HOSPITAL_COMMUNITY): Payer: Self-pay | Admitting: Nurse Practitioner

## 2021-08-08 LAB — CULTURE, BLOOD (ROUTINE X 2)
Culture: NO GROWTH
Special Requests: ADEQUATE

## 2021-08-11 ENCOUNTER — Ambulatory Visit: Payer: 59 | Admitting: Orthopaedic Surgery

## 2021-08-31 ENCOUNTER — Other Ambulatory Visit: Payer: Self-pay | Admitting: Cardiology

## 2021-08-31 NOTE — Telephone Encounter (Signed)
Prescription refill request for Eliquis received. Indication:Afib Last office visit:5/22 Scr:0.8 Age: 60 Weight:129.3 kg  Prescription refilled

## 2021-09-16 ENCOUNTER — Inpatient Hospital Stay (HOSPITAL_COMMUNITY)
Admission: EM | Admit: 2021-09-16 | Discharge: 2021-09-18 | DRG: 917 | Disposition: A | Discharge status: Home / Self Care | Payer: 59 | Attending: Internal Medicine | Admitting: Internal Medicine

## 2021-09-16 ENCOUNTER — Emergency Department (HOSPITAL_COMMUNITY): Payer: 59

## 2021-09-16 DIAGNOSIS — K219 Gastro-esophageal reflux disease without esophagitis: Secondary | ICD-10-CM | POA: Diagnosis present

## 2021-09-16 DIAGNOSIS — Z91040 Latex allergy status: Secondary | ICD-10-CM

## 2021-09-16 DIAGNOSIS — Z791 Long term (current) use of non-steroidal anti-inflammatories (NSAID): Secondary | ICD-10-CM

## 2021-09-16 DIAGNOSIS — W19XXXA Unspecified fall, initial encounter: Secondary | ICD-10-CM

## 2021-09-16 DIAGNOSIS — R4182 Altered mental status, unspecified: Secondary | ICD-10-CM

## 2021-09-16 DIAGNOSIS — F32A Depression, unspecified: Secondary | ICD-10-CM | POA: Diagnosis present

## 2021-09-16 DIAGNOSIS — Z6841 Body Mass Index (BMI) 40.0 and over, adult: Secondary | ICD-10-CM

## 2021-09-16 DIAGNOSIS — I480 Paroxysmal atrial fibrillation: Secondary | ICD-10-CM | POA: Diagnosis present

## 2021-09-16 DIAGNOSIS — G9341 Metabolic encephalopathy: Secondary | ICD-10-CM | POA: Diagnosis present

## 2021-09-16 DIAGNOSIS — E871 Hypo-osmolality and hyponatremia: Secondary | ICD-10-CM

## 2021-09-16 DIAGNOSIS — F419 Anxiety disorder, unspecified: Secondary | ICD-10-CM | POA: Diagnosis present

## 2021-09-16 DIAGNOSIS — Z7901 Long term (current) use of anticoagulants: Secondary | ICD-10-CM

## 2021-09-16 DIAGNOSIS — T50915A Adverse effect of multiple unspecified drugs, medicaments and biological substances, initial encounter: Secondary | ICD-10-CM | POA: Diagnosis present

## 2021-09-16 DIAGNOSIS — G9340 Encephalopathy, unspecified: Secondary | ICD-10-CM | POA: Diagnosis present

## 2021-09-16 DIAGNOSIS — Y92009 Unspecified place in unspecified non-institutional (private) residence as the place of occurrence of the external cause: Secondary | ICD-10-CM

## 2021-09-16 DIAGNOSIS — T50911A Poisoning by multiple unspecified drugs, medicaments and biological substances, accidental (unintentional), initial encounter: Secondary | ICD-10-CM | POA: Diagnosis not present

## 2021-09-16 DIAGNOSIS — F411 Generalized anxiety disorder: Secondary | ICD-10-CM | POA: Diagnosis present

## 2021-09-16 DIAGNOSIS — M5030 Other cervical disc degeneration, unspecified cervical region: Secondary | ICD-10-CM | POA: Diagnosis present

## 2021-09-16 DIAGNOSIS — G928 Other toxic encephalopathy: Secondary | ICD-10-CM | POA: Diagnosis present

## 2021-09-16 DIAGNOSIS — M160 Bilateral primary osteoarthritis of hip: Secondary | ICD-10-CM | POA: Diagnosis present

## 2021-09-16 DIAGNOSIS — M19041 Primary osteoarthritis, right hand: Secondary | ICD-10-CM | POA: Diagnosis present

## 2021-09-16 DIAGNOSIS — Z886 Allergy status to analgesic agent status: Secondary | ICD-10-CM

## 2021-09-16 DIAGNOSIS — M5136 Other intervertebral disc degeneration, lumbar region: Secondary | ICD-10-CM | POA: Diagnosis present

## 2021-09-16 DIAGNOSIS — Z9109 Other allergy status, other than to drugs and biological substances: Secondary | ICD-10-CM

## 2021-09-16 DIAGNOSIS — Z882 Allergy status to sulfonamides status: Secondary | ICD-10-CM

## 2021-09-16 DIAGNOSIS — Z20822 Contact with and (suspected) exposure to covid-19: Secondary | ICD-10-CM | POA: Diagnosis present

## 2021-09-16 DIAGNOSIS — D509 Iron deficiency anemia, unspecified: Secondary | ICD-10-CM

## 2021-09-16 DIAGNOSIS — E039 Hypothyroidism, unspecified: Secondary | ICD-10-CM | POA: Diagnosis present

## 2021-09-16 DIAGNOSIS — W010XXA Fall on same level from slipping, tripping and stumbling without subsequent striking against object, initial encounter: Secondary | ICD-10-CM | POA: Diagnosis present

## 2021-09-16 DIAGNOSIS — Z79899 Other long term (current) drug therapy: Secondary | ICD-10-CM

## 2021-09-16 DIAGNOSIS — M19042 Primary osteoarthritis, left hand: Secondary | ICD-10-CM | POA: Diagnosis present

## 2021-09-16 LAB — CBC WITH DIFFERENTIAL/PLATELET
Abs Immature Granulocytes: 0.03 10*3/uL (ref 0.00–0.07)
Basophils Absolute: 0 10*3/uL (ref 0.0–0.1)
Basophils Relative: 0 %
Eosinophils Absolute: 0.1 10*3/uL (ref 0.0–0.5)
Eosinophils Relative: 1 %
HCT: 33.1 % — ABNORMAL LOW (ref 36.0–46.0)
Hemoglobin: 9.5 g/dL — ABNORMAL LOW (ref 12.0–15.0)
Immature Granulocytes: 0 %
Lymphocytes Relative: 16 %
Lymphs Abs: 1.3 10*3/uL (ref 0.7–4.0)
MCH: 21.9 pg — ABNORMAL LOW (ref 26.0–34.0)
MCHC: 28.7 g/dL — ABNORMAL LOW (ref 30.0–36.0)
MCV: 76.4 fL — ABNORMAL LOW (ref 80.0–100.0)
Monocytes Absolute: 0.5 10*3/uL (ref 0.1–1.0)
Monocytes Relative: 7 %
Neutro Abs: 6.2 10*3/uL (ref 1.7–7.7)
Neutrophils Relative %: 76 %
Platelets: 314 10*3/uL (ref 150–400)
RBC: 4.33 MIL/uL (ref 3.87–5.11)
RDW: 17.2 % — ABNORMAL HIGH (ref 11.5–15.5)
WBC: 8.1 10*3/uL (ref 4.0–10.5)
nRBC: 0 % (ref 0.0–0.2)

## 2021-09-16 LAB — COMPREHENSIVE METABOLIC PANEL
ALT: 16 U/L (ref 0–44)
AST: 36 U/L (ref 15–41)
Albumin: 3.5 g/dL (ref 3.5–5.0)
Alkaline Phosphatase: 76 U/L (ref 38–126)
Anion gap: 11 (ref 5–15)
BUN: 11 mg/dL (ref 6–20)
CO2: 21 mmol/L — ABNORMAL LOW (ref 22–32)
Calcium: 8.7 mg/dL — ABNORMAL LOW (ref 8.9–10.3)
Chloride: 101 mmol/L (ref 98–111)
Creatinine, Ser: 0.98 mg/dL (ref 0.44–1.00)
GFR, Estimated: >60 mL/min (ref >60)
Glucose, Bld: 99 mg/dL (ref 70–99)
Potassium: 5.4 mmol/L — ABNORMAL HIGH (ref 3.5–5.1)
Sodium: 133 mmol/L — ABNORMAL LOW (ref 135–145)
Total Bilirubin: 1.3 mg/dL — ABNORMAL HIGH (ref 0.3–1.2)
Total Protein: 6.9 g/dL (ref 6.5–8.1)

## 2021-09-16 LAB — ACETAMINOPHEN LEVEL: Acetaminophen (Tylenol), Serum: <10 ug/mL — ABNORMAL LOW (ref 10–30)

## 2021-09-16 LAB — SALICYLATE LEVEL: Salicylate Lvl: <7 mg/dL — ABNORMAL LOW (ref 7.0–30.0)

## 2021-09-16 LAB — ETHANOL: Alcohol, Ethyl (B): <10 mg/dL (ref <10)

## 2021-09-16 LAB — CK: Total CK: 557 U/L — ABNORMAL HIGH (ref 38–234)

## 2021-09-16 MED ORDER — ONDANSETRON HCL 4 MG/2ML IJ SOLN
4.0000 mg | Freq: Once | INTRAMUSCULAR | Status: DC
  Filled 2021-09-16: qty 2

## 2021-09-16 MED ORDER — HYDROMORPHONE HCL 1 MG/ML IJ SOLN: 1.0000 mg | Freq: Once | INTRAMUSCULAR | Status: DC

## 2021-09-16 MED ORDER — SODIUM CHLORIDE 0.9 % IV BOLUS
1000.0000 mL | Freq: Once | INTRAVENOUS | Status: AC
  Administered 2021-09-16: 23:00:00 1000 mL via INTRAVENOUS

## 2021-09-16 NOTE — ED Provider Notes (Signed)
Tammy [name] Provider Note   CSN: [id number] Arrival date & time: 09/16/21  1951     History No chief complaint on file.   Tammy [name] is a 60 y.o. female.  This is a patient with a past medical history of morbid obesity, depression, generalized anxiety disorder, atrial fibrillation with RVR on anticoagulation, and hypothyroidism.  She presents the emergency [name] after fall that occurred earlier today where she said that she tripped and fell.  She denies any loss of consciousness or hitting her head.  She is on Eliquis. Patient reports pain to bilateral hips, bilateral rib cages, lower back.  She is very sleepy when I am talking to her and difficult to obtain a history from.  Apparently via EMS, she was on the ground for at least 2 hours after her fall.  Per husband patient was in her normal mentation status earlier this afternoon.  He said he was at the store and when he returned home, she was yelling down to him that she had fallen upstairs.  He is wheelchair-bound was unable to go up to her, so he called EMS at that time.  He said there is been no change in her medications, however she is on Klonopin and lots of antidepressant medications.  HPI     Past Medical History:  Diagnosis Date   Anxiety    Arthritis    neck and lower back - deg disc disease, fingers   Depression    Frequency of urination    GERD (gastroesophageal reflux disease)    History of seizure per pt no seizure since    11-07-2015  in setting of stress and sleep deprivation  /  negative work-up by neurologist unknown idiology   Hypothyroidism    Injury of thumb, left    03-31-2016     Pelvic relaxation    SUI (stress urinary incontinence, female)     Patient Active Problem List   Diagnosis Date Noted   Closed nondisplaced fracture of neck of right radius 07/21/2020   Somnolence    Aspiration pneumonia ([place]) 02/20/2020   Lactic acidosis 02/72/5366   Acute  metabolic encephalopathy 44/12/4740   Sepsis ([place]) 02/20/2020   Chronic pain of both knees 01/08/2020   Encephalopathy acute 10/27/2019   Acute encephalopathy 10/27/2019   CAP (community acquired pneumonia) 07/31/2019   Atrial fibrillation with RVR ([place]) 07/31/2019   Morbid obesity with BMI of 50.0-59.9, adult ([place]) 07/31/2019   Respiratory failure ([place]) 04/14/2019   Community acquired pneumonia 04/14/2019   Closed nondisplaced fracture of head of left radius 06/20/2018   Sprain of left wrist 06/20/2018   Peroneal tendinitis, right leg 06/23/2017   Trochanteric bursitis, right hip 06/23/2017   Acute right-sided low back pain with left-sided sciatica 03/24/2017   Pain in right hip 03/24/2017   Impingement syndrome of left shoulder 08/30/2016   Impingement syndrome of right shoulder 08/30/2016   Plantar fasciitis, bilateral 08/30/2016   Pelvic relaxation 04/13/2016    Class: Present on Admission   S/P hysterectomy 04/12/2016   Depression 11/16/2015   Generalized anxiety disorder 11/16/2015   Convulsion ([place]) 11/16/2015   SUI (stress urinary incontinence, female) 09/26/2012    Past Surgical History:  Procedure Laterality Date   ANTERIOR AND POSTERIOR REPAIR  09/25/2012   Procedure: ANTERIOR (CYSTOCELE) AND POSTERIOR REPAIR (RECTOCELE);  Surgeon: Lahoma Crocker, MD;  Location: [facility];  Service: Gynecology;  Laterality: N/A;   ANTERIOR AND POSTERIOR REPAIR WITH SACROSPINOUS FIXATION N/A  04/12/2016   Procedure: ANTERIOR AND POSTERIOR REPAIR WITH SACROSPINOUS LIGAMENT SUSPENSION, CYSTOSCOPY;  Surgeon: Arvella Nigh, MD;  Location: [facility];  Service: Gynecology;  Laterality: N/A;   APPENDECTOMY  1987   BREAST EXCISIONAL BIOPSY Left    No scar visable    BREAST REDUCTION SURGERY  2000  aprrox   ENDOMETRIAL ABLATION W/ NOVASURE  2011   in office   EXCISION LEFT BREAST BX  12-07-1999   benign   LAPAROSCOPIC CHOLECYSTECTOMY  02-09-2005   LAPAROSCOPIC VAGINAL  HYSTERECTOMY WITH SALPINGO OOPHORECTOMY Bilateral 04/12/2016   Procedure: LAPAROSCOPIC ASSISTED VAGINAL HYSTERECTOMY WITH SALPINGO OOPHORECTOMY;  Surgeon: Arvella Nigh, MD;  Location: [facility];  Service: Gynecology;  Laterality: Bilateral;   ORIF RADIAL FRACTURE Left 02/09/2021   Procedure: ORIF LEFT DISTAL RADIUS FRACTURE;  Surgeon: Cindra Presume, MD;  Location: [facility];  Service: Plastics;  Laterality: Left;  90 min   REDUCTION MAMMAPLASTY Bilateral    ROUX-EN-Y GASTRIC BYPASS  06/  2002   TUBAL LIGATION  10/ 1990     OB History   No obstetric history on file.     Family History  Problem Relation Age of Onset   Colon polyps Mother    Heart disease Father 28       CABG   Breast cancer Maternal Aunt    Stomach cancer Maternal Grandmother    Diabetes Maternal Grandmother    Heart disease Maternal Grandmother    Stomach cancer Maternal Uncle    Colon cancer Maternal Uncle        x3   Diabetes Maternal Aunt        Type I   Diabetes Maternal Uncle        x5 - Type II   Heart disease Paternal Grandmother    Esophageal cancer Neg Hx    Gallbladder disease Neg Hx    Seizures Neg Hx     Social History   Tobacco Use   Smoking status: Never   Smokeless tobacco: Never  Vaping Use   Vaping Use: Never used  Substance Use Topics   Alcohol use: No    Comment: drinks daily - none since 08/2012 per pt.   Drug use: No    Home Medications Prior to Admission medications   Medication Sig Start Date End Date Taking? Authorizing Provider  buPROPion (WELLBUTRIN XL) 150 MG 24 hr tablet Take 450 mg by mouth daily. 12/17/19   [provider]  celecoxib (CELEBREX) 200 MG capsule Take 1 capsule (200 mg total) by mouth 2 (two) times daily. 03/19/21   Cindra Presume, MD  clonazePAM (KLONOPIN) 1 MG tablet Take 1 mg by mouth 3 (three) times daily as needed for anxiety.  03/24/20   [provider]  clotrimazole (LOTRIMIN) 1 % cream Apply to affected area 2 times  daily 07/29/21   Couture, Cortni S, PA-C  ELIQUIS 5 MG TABS tablet TAKE ONE TABLET BY MOUTH TWICE A DAY 08/31/21   Minus Breeding, MD  FLUoxetine (PROZAC) 20 MG capsule Take 20 mg by mouth at bedtime.  07/26/18   [provider]  gabapentin (NEURONTIN) 600 MG tablet Take 1,200 mg by mouth 3 (three) times daily.    [provider]  HYDROcodone-acetaminophen (NORCO) 5-325 MG tablet Take 1 tablet by mouth every 6 (six) hours as needed for moderate pain. 02/25/21   Cindra Presume, MD  KLOR-CON M20 20 MEQ tablet TAKE ONE TABLET BY MOUTH DAILY 08/06/21   Hochrein,  Jeneen Rinks, MD  Melatonin 10 MG TABS Take 10 mg by mouth at bedtime as needed (sleep).    [provider]  metoprolol succinate (TOPROL-XL) 25 MG 24 hr tablet Take 1 tablet (25 mg total) by mouth at bedtime. 03/27/21   Minus Breeding, MD  OLANZapine (ZYPREXA) 10 MG tablet Take 10 mg by mouth at bedtime. 04/04/19   [provider]  ondansetron (ZOFRAN) 4 MG tablet Take 1 tablet (4 mg total) by mouth every 8 (eight) hours as needed for nausea or vomiting. 02/09/21   Scheeler, Carola Rhine, PA-C    Allergies    Nsaids, Adhesive [tape], Latex, and Sulfa antibiotics  Review of Systems   Review of Systems  Constitutional:  Negative for chills and fever.  HENT:  Negative for congestion, rhinorrhea and sore throat.   Eyes:  Negative for visual disturbance.  Respiratory:  Negative for cough, chest tightness and shortness of breath.   Cardiovascular:  Negative for chest pain, palpitations and leg swelling.  Gastrointestinal:  Negative for abdominal pain, blood in stool, constipation, diarrhea, nausea and vomiting.  Genitourinary:  Negative for dysuria, flank pain and hematuria.  Musculoskeletal:  Positive for arthralgias and back pain.  Skin:  Negative for rash and wound.  Neurological:  Negative for dizziness, syncope, weakness, light-headedness and headaches.  Psychiatric/Behavioral:  Negative for confusion.   All  other systems reviewed and are negative.  Physical Exam Updated Vital Signs BP 135/83 (BP Location: Left Arm)    Pulse 83    Temp 98 F (36.7 C)    Resp 16    LMP 09/30/2012    SpO2 100%   Physical Exam Vitals and nursing note reviewed.  Constitutional:      General: She is not in acute distress.    Appearance: Normal appearance. She is obese. She is ill-appearing. She is not toxic-appearing or diaphoretic.     Comments: Appears chronically ill  HENT:     Head: Normocephalic and atraumatic.     Nose: No nasal deformity.     Mouth/Throat:     Lips: Pink. No lesions.     Mouth: No injury, lacerations, oral lesions or angioedema.     Pharynx: Uvula midline. No pharyngeal swelling or uvula swelling.  Eyes:     General: Gaze aligned appropriately. No scleral icterus.       Right eye: No discharge.        Left eye: No discharge.     Conjunctiva/sclera: Conjunctivae normal.     Right eye: Right conjunctiva is not injected. No exudate or hemorrhage.    Left eye: Left conjunctiva is not injected. No exudate or hemorrhage.    Pupils: Pupils are equal, round, and reactive to light.  Neck:     Comments: No C-Spine midline tenderness or step-offs. Cardiovascular:     Rate and Rhythm: Normal rate and regular rhythm.     Pulses: Normal pulses.          Radial pulses are 2+ on the right side and 2+ on the left side.       Dorsalis pedis pulses are 2+ on the right side and 2+ on the left side.     Heart sounds: Normal heart sounds, S1 normal and S2 normal. Heart sounds not distant. No murmur heard.   No friction rub. No gallop. No S3 or S4 sounds.     Comments: 2 Plus pedal and radial pulses bilaterally. Pulmonary:     Effort: Pulmonary effort is normal. No  accessory muscle usage or respiratory distress.     Breath sounds: Normal breath sounds. No stridor. No wheezing, rhonchi or rales.  Chest:     Chest wall: No tenderness.  Abdominal:     General: Abdomen is flat. Bowel sounds are  normal. There is no distension.     Palpations: Abdomen is soft. There is no mass or pulsatile mass.     Tenderness: There is no abdominal tenderness. There is no guarding or rebound.  Musculoskeletal:     Cervical back: Neck supple. No rigidity or tenderness.     Right lower leg: No edema.     Left lower leg: No edema.     Comments: Patient With tenderness to palpation of bilateral hips, bilateral rib cages.  Patient has no thoracic, lumbar, or sacral midline tenderness to palpation with no step-offs noted.  Does have reproducible paraspinal muscular tenderness to palpation.  On palpation of all joints, there is no tenderness.   Skin:    General: Skin is warm and dry.     Coloration: Skin is not jaundiced or pale.     Findings: No bruising, erythema, lesion or rash.  Neurological:     General: No focal deficit present.     Mental Status: She is alert.     GCS: GCS eye subscore is 4. GCS verbal subscore is 5. GCS motor subscore is 6.     Comments: Patient is arousable on assessment, however very sleepy.  She follows commands.  She has motor strength 5 out of 5 in all 4 extremities.  Sensation is grossly intact.    Psychiatric:        Mood and Affect: Mood normal.        Behavior: Behavior normal. Behavior is cooperative.    ED Results / Procedures / Treatments   Labs (all labs ordered are listed, but only abnormal results are displayed) Labs Reviewed  CBC WITH DIFFERENTIAL/PLATELET - Abnormal; Notable for the following components:      Result Value   Hemoglobin 9.5 (*)    HCT 33.1 (*)    MCV 76.4 (*)    MCH 21.9 (*)    MCHC 28.7 (*)    RDW 17.2 (*)    All other components within normal limits  COMPREHENSIVE METABOLIC PANEL - Abnormal; Notable for the following components:   Sodium 133 (*)    Potassium 5.4 (*)    CO2 21 (*)    Calcium 8.7 (*)    Total Bilirubin 1.3 (*)    All other components within normal limits  CK - Abnormal; Notable for the following components:    Total CK 557 (*)    All other components within normal limits  ACETAMINOPHEN LEVEL - Abnormal; Notable for the following components:   Acetaminophen (Tylenol), Serum <10 (*)    All other components within normal limits  SALICYLATE LEVEL - Abnormal; Notable for the following components:   Salicylate Lvl <6.2 (*)    All other components within normal limits  RAPID URINE DRUG SCREEN, HOSP PERFORMED - Abnormal; Notable for the following components:   Opiates POSITIVE (*)    All other components within normal limits  ETHANOL  URINALYSIS, ROUTINE W REFLEX MICROSCOPIC  AMMONIA    EKG EKG Interpretation  Date/Time:  Wednesday September 16 2021 22:33:58 EST Ventricular Rate:  71 PR Interval:  150 QRS Duration: 108 QT Interval:  438 QTC Calculation: 476 R Axis:   65 Text Interpretation: Sinus rhythm No significant change since  last tracing Confirmed by Wandra Arthurs ([id number]) on 09/16/2021 11:21:20 PM  Radiology DG Ribs Bilateral W/Chest  Result Date: 09/16/2021 CLINICAL DATA:  Fall EXAM: BILATERAL RIBS AND CHEST - 4+ VIEW COMPARISON:  07/29/2021 FINDINGS: Frontal view of the chest as well as frontal and oblique views of the bilateral thoracic cage are obtained. Cardiac silhouette remains enlarged. No airspace disease, effusion, or pneumothorax. There are no acute displaced fractures. IMPRESSION: 1. No acute intrathoracic process.  No displaced rib fractures. Electronically Signed   By: Randa Ngo M.D.   On: 09/16/2021 22:41   DG Lumbar Spine Complete  Result Date: 09/16/2021 CLINICAL DATA:  [place] EXAM: LUMBAR SPINE - COMPLETE 4+ VIEW COMPARISON:  03/26/2020 FINDINGS: Frontal, bilateral oblique, lateral views of the lumbar spine are obtained. There are 5 non-rib-bearing lumbar type vertebral bodies in normal alignment. Stable multilevel spondylosis and facet hypertrophy, most pronounced at L4-5 and L5-S1. No acute displaced fracture. Sacroiliac joints are unremarkable. IMPRESSION: 1. Stable  lower lumbar spondylosis and facet hypertrophy. No acute fracture. Electronically Signed   By: Randa Ngo M.D.   On: 09/16/2021 22:43   CT HEAD WO CONTRAST (5MM)  Result Date: 09/16/2021 CLINICAL DATA:  [place], down for 2 hours, anticoagulated EXAM: CT HEAD WITHOUT CONTRAST TECHNIQUE: Contiguous axial images were obtained from the base of the skull through the vertex without intravenous contrast. COMPARISON:  12/22/2020 FINDINGS: Brain: No acute infarct or hemorrhage. Lateral ventricles and midline structures are unremarkable. No acute extra-axial fluid collections. No mass effect. Vascular: No hyperdense vessel or unexpected calcification. Skull: Normal. Negative for fracture or focal lesion. Sinuses/Orbits: Mild polypoid mucosal thickening within the maxillary sinuses. Remaining paranasal sinuses are clear. Other: None. IMPRESSION: 1. No acute intracranial process. Electronically Signed   By: Randa Ngo M.D.   On: 09/16/2021 22:09   CT Cervical Spine Wo Contrast  Result Date: 09/16/2021 CLINICAL DATA:  [place], down for 2 hours EXAM: CT CERVICAL SPINE WITHOUT CONTRAST TECHNIQUE: Multidetector CT imaging of the cervical spine was performed without intravenous contrast. Multiplanar CT image reconstructions were also generated. COMPARISON:  None. FINDINGS: Alignment: Stable straightening of the cervical spine. Otherwise alignment is anatomic. Skull base and vertebrae: No acute fracture. No primary bone lesion or focal pathologic process. Soft tissues and spinal canal: No prevertebral fluid or swelling. No visible canal hematoma. Disc levels: Mild spondylosis from C4-5 through C6-7. Left predominant neural foraminal encroachment at C4-5, right predominant neural foraminal encroachment at C5-6, and symmetrical neural foraminal encroachment C6-7. Upper chest: Airway is patent.  Lung apices are clear. Other: Reconstructed images demonstrate no additional findings. IMPRESSION: 1. No acute cervical spine  fracture. 2. Stable lower cervical spondylosis. Electronically Signed   By: Randa Ngo M.D.   On: 09/16/2021 22:12   DG Hip Unilat W or Wo Pelvis 2-3 Views Left  Result Date: 09/16/2021 CLINICAL DATA:  [place] EXAM: DG HIP (WITH OR WITHOUT PELVIS) 2-3V LEFT COMPARISON:  07/03/2016 FINDINGS: Frontal and frogleg lateral views of the left hip are obtained. No fracture, subluxation, or dislocation. Mild osteoarthritis. Visualized portions of the pelvis are unremarkable. IMPRESSION: 1. Mild left hip osteoarthritis.  No acute fracture. Electronically Signed   By: Randa Ngo M.D.   On: 09/16/2021 22:42   DG Hip Unilat W or Wo Pelvis 2-3 Views Right  Result Date: 09/16/2021 CLINICAL DATA:  [place], right hip pain EXAM: DG HIP (WITH OR WITHOUT PELVIS) 2-3V RIGHT COMPARISON:  03/24/2017 FINDINGS: Frontal view of the pelvis as well as frontal and  frogleg lateral views of the right hip are obtained. No acute fracture, subluxation, or dislocation. Mild symmetrical bilateral hip osteoarthritis. The remainder of the bony pelvis is unremarkable. Soft tissues are normal. IMPRESSION: 1. Mild bilateral hip osteoarthritis.  No acute fracture. Electronically Signed   By: Randa Ngo M.D.   On: 09/16/2021 22:40    Procedures Procedures   Medications Ordered in ED Medications  sodium chloride 0.9 % bolus 1,000 mL (1,000 mLs Intravenous New Bag/Given 09/16/21 2231)    ED Course  I have reviewed the triage vital signs and the nursing notes.  Pertinent labs & imaging results that were available during my care of the patient were reviewed by me and considered in my medical decision making (see chart for details).    MDM Rules/Calculators/A&P                           This is a 60 y.o. female with a PMH of morbid obesity, depression, generalized anxiety disorder, atrial fibrillation with RVR on anticoagulation, and hypothyroidism. who presents to the ED after an unwitnessed fall.  History is difficult to  obtain because patient is very lethargic during my assessment.  Sounds like she had a mechanical fall was down for 2 hours before EMS picked her up.  Apparently patient is baseline is with normal mentation.  On chart review, patient has had a history of seizures, encephalopathy, alcohol use disorder. She is on multiple medications including Klonopin and hydrocodone.   Vitals: Stable  Patient is obese and ill appearing and in no acute distress. Exam notable for rib cage tenderness bilaterally and bilateral hip tenderness to palpation.  She has full range of motion of all extremities and does follow commands.  Her pupils are normal.  She has full motor strength in all extremities.  For some reason she is very lethargic with slurred speech.  I personally reviewed all laboratory work and imaging. Abnormal results outlined below. CK is 557, however this is down from what it was 8 months ago.  She has no leukocytosis.  Hemoglobin of 9.5 is stable from prior.  She has mild hyponatremia to 133.  Mild hyperkalemia to 5.4. CT head and cervical spine.  X-rays of bilateral hips, and chest are without evidence of fracture.  Lumbar spine x-ray without evidence of fracture.  EKG with normal sinus rhythm.  Labs and imaging do not provide rationale for patient's altered mental status.  We will add on tox panel, UA, and ammonia.  ETOH, Acetaminophen, Salicylate normal. Awaiting UA, UDS, and Ammonia  Dispo Plan: Likely admit for encephalopathy likely 2/2 to polypharmacy v unknown etiology.  @1210 , Spoke with Dr. Tonie Griffith. Recommends calling neuro for eval for post ictal state and possible seizure evaluation. He will see patient.   I have seen and evaluated this patient in conjunction with my attending physician who agrees and has made changes to the plan accordingly.  Portions of this note were generated with [employer]. Dictation errors may occur despite best attempts at proofreading.   Final  Clinical Impression(s) / ED Diagnoses Final diagnoses:  Fall  Altered mental status, unspecified altered mental status type    Rx / DC Orders ED Discharge Orders     None        Adolphus Birchwood, PA-C 09/17/21 0010    Drenda Freeze, MD 09/20/21 [phone]

## 2021-09-16 NOTE — ED Triage Notes (Signed)
EMS arrival. Fall from bed. On ground for 2 hours. No LOC/head strike. Pain left hip and L lower back. On Eliquis. 41mcg enroute. BG 120. Rates pain an 8.

## 2021-09-17 ENCOUNTER — Inpatient Hospital Stay (HOSPITAL_COMMUNITY): Payer: 59

## 2021-09-17 ENCOUNTER — Encounter (HOSPITAL_COMMUNITY): Payer: Self-pay | Admitting: Family Medicine

## 2021-09-17 DIAGNOSIS — M19042 Primary osteoarthritis, left hand: Secondary | ICD-10-CM | POA: Diagnosis present

## 2021-09-17 DIAGNOSIS — Z6841 Body Mass Index (BMI) 40.0 and over, adult: Secondary | ICD-10-CM | POA: Diagnosis not present

## 2021-09-17 DIAGNOSIS — G9340 Encephalopathy, unspecified: Secondary | ICD-10-CM | POA: Diagnosis not present

## 2021-09-17 DIAGNOSIS — M19041 Primary osteoarthritis, right hand: Secondary | ICD-10-CM | POA: Diagnosis present

## 2021-09-17 DIAGNOSIS — G928 Other toxic encephalopathy: Secondary | ICD-10-CM | POA: Diagnosis present

## 2021-09-17 DIAGNOSIS — T50911A Poisoning by multiple unspecified drugs, medicaments and biological substances, accidental (unintentional), initial encounter: Secondary | ICD-10-CM | POA: Diagnosis present

## 2021-09-17 DIAGNOSIS — E871 Hypo-osmolality and hyponatremia: Secondary | ICD-10-CM

## 2021-09-17 DIAGNOSIS — D649 Anemia, unspecified: Secondary | ICD-10-CM | POA: Insufficient documentation

## 2021-09-17 DIAGNOSIS — Z9109 Other allergy status, other than to drugs and biological substances: Secondary | ICD-10-CM | POA: Diagnosis not present

## 2021-09-17 DIAGNOSIS — Z20822 Contact with and (suspected) exposure to covid-19: Secondary | ICD-10-CM | POA: Diagnosis present

## 2021-09-17 DIAGNOSIS — G9341 Metabolic encephalopathy: Secondary | ICD-10-CM | POA: Diagnosis present

## 2021-09-17 DIAGNOSIS — Z882 Allergy status to sulfonamides status: Secondary | ICD-10-CM | POA: Diagnosis not present

## 2021-09-17 DIAGNOSIS — T50915A Adverse effect of multiple unspecified drugs, medicaments and biological substances, initial encounter: Secondary | ICD-10-CM | POA: Diagnosis present

## 2021-09-17 DIAGNOSIS — M160 Bilateral primary osteoarthritis of hip: Secondary | ICD-10-CM | POA: Diagnosis present

## 2021-09-17 DIAGNOSIS — D509 Iron deficiency anemia, unspecified: Secondary | ICD-10-CM

## 2021-09-17 DIAGNOSIS — Z886 Allergy status to analgesic agent status: Secondary | ICD-10-CM | POA: Diagnosis not present

## 2021-09-17 DIAGNOSIS — R4182 Altered mental status, unspecified: Secondary | ICD-10-CM | POA: Diagnosis present

## 2021-09-17 DIAGNOSIS — I480 Paroxysmal atrial fibrillation: Secondary | ICD-10-CM | POA: Diagnosis present

## 2021-09-17 DIAGNOSIS — E039 Hypothyroidism, unspecified: Secondary | ICD-10-CM | POA: Diagnosis present

## 2021-09-17 DIAGNOSIS — Z91040 Latex allergy status: Secondary | ICD-10-CM | POA: Diagnosis not present

## 2021-09-17 DIAGNOSIS — W010XXA Fall on same level from slipping, tripping and stumbling without subsequent striking against object, initial encounter: Secondary | ICD-10-CM | POA: Diagnosis present

## 2021-09-17 DIAGNOSIS — M5136 Other intervertebral disc degeneration, lumbar region: Secondary | ICD-10-CM | POA: Diagnosis present

## 2021-09-17 DIAGNOSIS — F411 Generalized anxiety disorder: Secondary | ICD-10-CM | POA: Diagnosis present

## 2021-09-17 DIAGNOSIS — Y92009 Unspecified place in unspecified non-institutional (private) residence as the place of occurrence of the external cause: Secondary | ICD-10-CM | POA: Diagnosis not present

## 2021-09-17 DIAGNOSIS — F32A Depression, unspecified: Secondary | ICD-10-CM | POA: Diagnosis present

## 2021-09-17 DIAGNOSIS — M5030 Other cervical disc degeneration, unspecified cervical region: Secondary | ICD-10-CM | POA: Diagnosis present

## 2021-09-17 DIAGNOSIS — K219 Gastro-esophageal reflux disease without esophagitis: Secondary | ICD-10-CM | POA: Diagnosis present

## 2021-09-17 DIAGNOSIS — F419 Anxiety disorder, unspecified: Secondary | ICD-10-CM | POA: Diagnosis present

## 2021-09-17 LAB — RESP PANEL BY RT-PCR (FLU A&B, COVID) ARPGX2
Influenza A by PCR: NEGATIVE
Influenza B by PCR: NEGATIVE
SARS Coronavirus 2 by RT PCR: NEGATIVE

## 2021-09-17 LAB — BASIC METABOLIC PANEL
Anion gap: 10 (ref 5–15)
BUN: 8 mg/dL (ref 6–20)
CO2: 24 mmol/L (ref 22–32)
Calcium: 8.9 mg/dL (ref 8.9–10.3)
Chloride: 106 mmol/L (ref 98–111)
Creatinine, Ser: 0.77 mg/dL (ref 0.44–1.00)
GFR, Estimated: >60 mL/min (ref >60)
Glucose, Bld: 100 mg/dL — ABNORMAL HIGH (ref 70–99)
Potassium: 4.2 mmol/L (ref 3.5–5.1)
Sodium: 140 mmol/L (ref 135–145)

## 2021-09-17 LAB — URINALYSIS, ROUTINE W REFLEX MICROSCOPIC
Bilirubin Urine: NEGATIVE
Glucose, UA: NEGATIVE mg/dL
Ketones, ur: NEGATIVE mg/dL
Leukocytes,Ua: NEGATIVE
Nitrite: POSITIVE — AB
Protein, ur: NEGATIVE mg/dL
Specific Gravity, Urine: <1.005 — ABNORMAL LOW (ref 1.005–1.030)
pH: 5.5 (ref 5.0–8.0)

## 2021-09-17 LAB — RAPID URINE DRUG SCREEN, HOSP PERFORMED
Amphetamines: NOT DETECTED
Barbiturates: NOT DETECTED
Benzodiazepines: NOT DETECTED
Cocaine: NOT DETECTED
Opiates: POSITIVE — AB
Tetrahydrocannabinol: NOT DETECTED

## 2021-09-17 LAB — CBC
HCT: 38.9 % (ref 36.0–46.0)
Hemoglobin: 10.2 g/dL — ABNORMAL LOW (ref 12.0–15.0)
MCH: 21.9 pg — ABNORMAL LOW (ref 26.0–34.0)
MCHC: 26.2 g/dL — ABNORMAL LOW (ref 30.0–36.0)
MCV: 83.7 fL (ref 80.0–100.0)
Platelets: 332 10*3/uL (ref 150–400)
RBC: 4.65 MIL/uL (ref 3.87–5.11)
RDW: 17.6 % — ABNORMAL HIGH (ref 11.5–15.5)
WBC: 5.9 10*3/uL (ref 4.0–10.5)
nRBC: 0 % (ref 0.0–0.2)

## 2021-09-17 LAB — URINALYSIS, MICROSCOPIC (REFLEX)

## 2021-09-17 LAB — AMMONIA: Ammonia: 28 umol/L (ref 9–35)

## 2021-09-17 LAB — TSH: TSH: 0.681 u[IU]/mL (ref 0.350–4.500)

## 2021-09-17 LAB — HIV ANTIBODY (ROUTINE TESTING W REFLEX): HIV Screen 4th Generation wRfx: NONREACTIVE

## 2021-09-17 MED ORDER — ALBUTEROL SULFATE (2.5 MG/3ML) 0.083% IN NEBU: 2.5000 mg | INHALATION_SOLUTION | RESPIRATORY_TRACT | Status: DC | PRN

## 2021-09-17 MED ORDER — ACETAMINOPHEN 325 MG PO TABS
650.0000 mg | ORAL_TABLET | Freq: Four times a day (QID) | ORAL | Status: DC | PRN
  Administered 2021-09-17: 650 mg via ORAL
  Filled 2021-09-17 (×2): qty 2

## 2021-09-17 MED ORDER — LIDOCAINE 5 % EX PTCH
1.0000 | MEDICATED_PATCH | CUTANEOUS | Status: DC
  Administered 2021-09-17 – 2021-09-18 (×2): 1 via TRANSDERMAL
  Filled 2021-09-17 (×2): qty 1

## 2021-09-17 MED ORDER — LACTATED RINGERS IV SOLN: INTRAVENOUS | Status: DC

## 2021-09-17 MED ORDER — ACETAMINOPHEN 650 MG RE SUPP: 650.0000 mg | Freq: Four times a day (QID) | RECTAL | Status: DC | PRN

## 2021-09-17 NOTE — Progress Notes (Addendum)
Neurology progress note:  S:She feels more awake than when she came in. At first, she denies taking opiates at home, but later said she took Oxycodone bid. She says Neurontin makes her dizzy but does not remember taking it. States her back is hurting and bothers her more when she lifts her LEs.  Denies extra meds Hisotry of sz 6 y ago - no sz now. No AEDs -- says sz was due to stress and sleep deprivation  O: Current vital signs: BP 126/86    Pulse 98    Temp 98 F (36.7 C)    Resp (!) 25    LMP 09/30/2012    SpO2 93%  Vital signs in last 24 hours: Temp:  [98 F (36.7 C)] 98 F (36.7 C) (12/14 1959) Pulse Rate:  [82-98] 98 (12/15 0800) Resp:  [16-25] 25 (12/15 0800) BP: (126-135)/(66-86) 126/86 (12/15 0800) SpO2:  [93 %-100 %] 93 % (12/15 0800)  GENERAL: Somnolent, but fairly well appearing female who is easily aroused. NAD.  HEENT: Normocephalic and atraumatic. LUNGS: Normal respiratory effort.  CV: RRR on tele.  Ext: warm.  NEURO:  Mental Status: Oriented to name, age, place, city, state. Not oriented to month, date, or day of week. Follows commands.  Speech/Language: speech is without aphasia or dysarthria.  Naming, repetition, fluency, and comprehension intact. She is able to tell NP that there are 11 quarters in $2.75.   Cranial Nerves:  II: PERRL. Visual fields full.  III, IV, VI: EOMI. Eyelids elevate symmetrically.  V: Sensation is intact to light touch and symmetrical to face.  VII: Smile is symmetrical.  VIII: hearing intact to voice. IX, X: Palate elevates symmetrically. Phonation is normal.  NK:NLZJQBHA shrug 5/5. XII: tongue is midline without fasciculations. Motor:  5/5 throughout.  Tone: is normal and bulk is normal. Mildly tremulous on both arms when outstretched Sensation- Intact to light touch bilaterally. Extinction absent to DSS.    Coordination: FTN intact bilaterally. Can not perform HKS due to back pain. No drift.  DTRs:  RUE:  brachioradialis 1       biceps 1 RLE:  patella     2 LUE:  brachioradialis   1  biceps  1 LLE:  patella 2 Gait- deferred.  Medications  Current Facility-Administered Medications:    acetaminophen (TYLENOL) tablet 650 mg, 650 mg, Oral, Q6H PRN **OR** acetaminophen (TYLENOL) suppository 650 mg, 650 mg, Rectal, Q6H PRN, Chotiner, Yevonne Aline, MD   albuterol (PROVENTIL) (2.5 MG/3ML) 0.083% nebulizer solution 2.5 mg, 2.5 mg, Nebulization, Q4H PRN, Chotiner, Yevonne Aline, MD   lactated ringers infusion, , Intravenous, Continuous, Chotiner, Yevonne Aline, MD, Last Rate: 125 mL/hr at 09/17/21 0617, New Bag at 09/17/21 0617  Current Outpatient Medications:    buPROPion (WELLBUTRIN XL) 150 MG 24 hr tablet, Take 450 mg by mouth daily., Disp: , Rfl:    celecoxib (CELEBREX) 200 MG capsule, Take 1 capsule (200 mg total) by mouth 2 (two) times daily., Disp: 60 capsule, Rfl: 1   clonazePAM (KLONOPIN) 1 MG tablet, Take 1 mg by mouth 3 (three) times daily as needed for anxiety. , Disp: , Rfl:    clotrimazole (LOTRIMIN) 1 % cream, Apply to affected area 2 times daily, Disp: 15 g, Rfl: 0   ELIQUIS 5 MG TABS tablet, TAKE ONE TABLET BY MOUTH TWICE A DAY, Disp: 180 tablet, Rfl: 1   FLUoxetine (PROZAC) 20 MG capsule, Take 20 mg by mouth at bedtime. , Disp: , Rfl: 11   gabapentin (  NEURONTIN) 600 MG tablet, Take 1,200 mg by mouth 3 (three) times daily., Disp: , Rfl:    HYDROcodone-acetaminophen (NORCO) 5-325 MG tablet, Take 1 tablet by mouth every 6 (six) hours as needed for moderate pain., Disp: 30 tablet, Rfl: 0   KLOR-CON M20 20 MEQ tablet, TAKE ONE TABLET BY MOUTH DAILY, Disp: 30 tablet, Rfl: 6   Melatonin 10 MG TABS, Take 10 mg by mouth at bedtime as needed (sleep)., Disp: , Rfl:    metoprolol succinate (TOPROL-XL) 25 MG 24 hr tablet, Take 1 tablet (25 mg total) by mouth at bedtime., Disp: 90 tablet, Rfl: 4   OLANZapine (ZYPREXA) 10 MG tablet, Take 10 mg by mouth at bedtime., Disp: , Rfl:    ondansetron (ZOFRAN) 4 MG tablet, Take 1 tablet (4  mg total) by mouth every 8 (eight) hours as needed for nausea or vomiting., Disp: 20 tablet, Rfl: 0  Pertinent Labs TSH . 681. Glucose 100.  UDS + opiates.   Imaging MD has reviewed images in epic and the results pertinent to this consultation are:  MRI Brain  Pending.   Assessment: 60 yo female who was seen last pm in consult for a fall at home followed by encephalopathy. Her exam today is better mentally to what has been charted before. However, she is still somnolent. NP suspects that possibly taking more than prescribed opiates, query with Neurontin, led to her presentation. For back pain, we can use a Lidoderm patch, as we are trying to avoid Narcotics. NP sees no findings that are consistent with seizure activity, but given previous sz episode - will get eeg  Impression: -opoid use, ? more than prescribed.  -Query Neurontin use at same time as opiate.  -Polypharmacy.   Recommendations/Plan:  -Continue to hold opiates and sedative medications.  -Lidoderm patch for back pain.  -we will f/up MRI brain.  -EEG  Pt seen by Clance Boll, MSN, APN-BC/Nurse Practitioner/Neuro and later by MD. Note and plan to be edited as needed by MD.  Pager: [phone]   Attending Neurohospitalist Addendum Patient seen and examined with APP/Resident. Agree with the history and physical as documented above. Agree with the plan as documented, which I helped formulate. I have independently reviewed the chart, obtained history, review of systems and examined the patient.I have personally reviewed pertinent head/neck/spine imaging (CT/MRI). Please feel free to call with any questions.  -- Amie Portland, MD Neurologist [facility] Pager: [phone]   ADDENDUM MRI brain with no acute abnormalities EEG -p  D/w Dr. Avon Gully   Addendum EEG completed-normal No new recommendations Please call back with questions as needed   -- Amie Portland, MD Neurologist [facility] Pager: [phone]

## 2021-09-17 NOTE — Consult Note (Signed)
Neurology Consultation  Reason for Consult: Encephalopathy Referring Physician: Harrold Donath, MD  CC: AMS, fall at home  History is obtained from: Patient and chart review  HPI: Tammy [name] is a 60 y.o. female with hx Afib on eliquis, hypothyroidism presenting to the ED s/p fall at home. Pt was noted to be encephalopathic unable to provide a history. At the time of this interview, pt was somnolent but easily woke up to voice.  She reports falling at home and hurting her left leg. Does not fully recall the event.  She asked about her labs and CT scan results. Neuro was consulted for encephalopathy work up.     Past Medical History:  Diagnosis Date   Anxiety    Arthritis    neck and lower back - deg disc disease, fingers   Depression    Frequency of urination    GERD (gastroesophageal reflux disease)    History of seizure per pt no seizure since    11-07-2015  in setting of stress and sleep deprivation  /  negative work-up by neurologist unknown idiology   Hypothyroidism    Injury of thumb, left    03-31-2016     Pelvic relaxation    SUI (stress urinary incontinence, female)    Family History  Problem Relation Age of Onset   Colon polyps Mother    Heart disease Father 77       CABG   Breast cancer Maternal Aunt    Stomach cancer Maternal Grandmother    Diabetes Maternal Grandmother    Heart disease Maternal Grandmother    Stomach cancer Maternal Uncle    Colon cancer Maternal Uncle        x3   Diabetes Maternal Aunt        Type I   Diabetes Maternal Uncle        x5 - Type II   Heart disease Paternal Grandmother    Esophageal cancer Neg Hx    Gallbladder disease Neg Hx    Seizures Neg Hx     Social History:   reports that she has never smoked. She has never used smokeless tobacco. She reports that she does not drink alcohol and does not use drugs.  Medications  Current Facility-Administered Medications:    acetaminophen (TYLENOL) tablet 650 mg, 650 mg,  Oral, Q6H PRN **OR** acetaminophen (TYLENOL) suppository 650 mg, 650 mg, Rectal, Q6H PRN, Chotiner, Yevonne Aline, MD   albuterol (PROVENTIL) (2.5 MG/3ML) 0.083% nebulizer solution 2.5 mg, 2.5 mg, Nebulization, Q4H PRN, Chotiner, Yevonne Aline, MD   lactated ringers infusion, , Intravenous, Continuous, Chotiner, Yevonne Aline, MD  Current Outpatient Medications:    buPROPion (WELLBUTRIN XL) 150 MG 24 hr tablet, Take 450 mg by mouth daily., Disp: , Rfl:    celecoxib (CELEBREX) 200 MG capsule, Take 1 capsule (200 mg total) by mouth 2 (two) times daily., Disp: 60 capsule, Rfl: 1   clonazePAM (KLONOPIN) 1 MG tablet, Take 1 mg by mouth 3 (three) times daily as needed for anxiety. , Disp: , Rfl:    clotrimazole (LOTRIMIN) 1 % cream, Apply to affected area 2 times daily, Disp: 15 g, Rfl: 0   ELIQUIS 5 MG TABS tablet, TAKE ONE TABLET BY MOUTH TWICE A DAY, Disp: 180 tablet, Rfl: 1   FLUoxetine (PROZAC) 20 MG capsule, Take 20 mg by mouth at bedtime. , Disp: , Rfl: 11   gabapentin (NEURONTIN) 600 MG tablet, Take 1,200 mg by mouth 3 (three) times daily., Disp: ,  Rfl:    HYDROcodone-acetaminophen (NORCO) 5-325 MG tablet, Take 1 tablet by mouth every 6 (six) hours as needed for moderate pain., Disp: 30 tablet, Rfl: 0   KLOR-CON M20 20 MEQ tablet, TAKE ONE TABLET BY MOUTH DAILY, Disp: 30 tablet, Rfl: 6   Melatonin 10 MG TABS, Take 10 mg by mouth at bedtime as needed (sleep)., Disp: , Rfl:    metoprolol succinate (TOPROL-XL) 25 MG 24 hr tablet, Take 1 tablet (25 mg total) by mouth at bedtime., Disp: 90 tablet, Rfl: 4   OLANZapine (ZYPREXA) 10 MG tablet, Take 10 mg by mouth at bedtime., Disp: , Rfl:    ondansetron (ZOFRAN) 4 MG tablet, Take 1 tablet (4 mg total) by mouth every 8 (eight) hours as needed for nausea or vomiting., Disp: 20 tablet, Rfl: 0  ROS:  Unable to obtain due to altered mental status.   Exam: Current vital signs: BP 135/83 (BP Location: Left Arm)    Pulse 83    Temp 98 F (36.7 C)    Resp 16    LMP  09/30/2012    SpO2 100%  Vital signs in last 24 hours: Temp:  [98 F (36.7 C)] 98 F (36.7 C) (12/14 1959) Pulse Rate:  [83] 83 (12/14 1959) Resp:  [16] 16 (12/14 1959) BP: (135)/(83) 135/83 (12/14 1959) SpO2:  [100 %] 100 % (12/14 1959)   Constitutional: Appears well-developed and well-nourished.  Psych: Affect appropriate to situation Eyes: No scleral injection HENT: No OP obstrucion Head: Normocephalic.  Cardiovascular: Normal rate and regular rhythm.  Respiratory: Effort normal, non-labored breathing GI: Soft.  No distension. There is no tenderness.  Skin: WDI  Neuro: AAOx2, fluent speech, no dysarthria, follows commands EOMI, PERRL, no facial asymmetry Tongue and palate midline Nl bulk and tone, no drift Moves all ext antigravity Sensation grossly intact  Labs Reviewed  CBC    Component Value Date/Time   WBC 8.1 09/16/2021 2031   RBC 4.33 09/16/2021 2031   HGB 9.5 (L) 09/16/2021 2031   HCT 33.1 (L) 09/16/2021 2031   PLT 314 09/16/2021 2031   MCV 76.4 (L) 09/16/2021 2031   MCH 21.9 (L) 09/16/2021 2031   MCHC 28.7 (L) 09/16/2021 2031   RDW 17.2 (H) 09/16/2021 2031   LYMPHSABS 1.3 09/16/2021 2031   MONOABS 0.5 09/16/2021 2031   EOSABS 0.1 09/16/2021 2031   BASOSABS 0.0 09/16/2021 2031    CMP     Component Value Date/Time   NA 133 (L) 09/16/2021 2031   K 5.4 (H) 09/16/2021 2031   CL 101 09/16/2021 2031   CO2 21 (L) 09/16/2021 2031   GLUCOSE 99 09/16/2021 2031   BUN 11 09/16/2021 2031   CREATININE 0.98 09/16/2021 2031   CALCIUM 8.7 (L) 09/16/2021 2031   PROT 6.9 09/16/2021 2031   ALBUMIN 3.5 09/16/2021 2031   AST 36 09/16/2021 2031   ALT 16 09/16/2021 2031   ALKPHOS 76 09/16/2021 2031   BILITOT 1.3 (H) 09/16/2021 2031   GFRNONAA >60 09/16/2021 2031   GFRAA >60 03/26/2020 1631    Lipid Panel     Component Value Date/Time   CHOL 160 08/01/2019 0210   TRIG 77 08/01/2019 0210   HDL 47 08/01/2019 0210   CHOLHDL 3.4 08/01/2019 0210   VLDL 15  08/01/2019 0210   LDLCALC 98 08/01/2019 0210     Imaging  CT-scan of the brain: No acute intracranial abnormality  Assessment: 60 yo F with encephalopathy s/p fall, UDS +opiate Pt's exam appears improved  compared to yesterday Cont to monitor Avoid opiates Neuro will follow  Total time spent 24min

## 2021-09-17 NOTE — ED Notes (Signed)
Mikaiya Tramble spouse [phone] requesting an update on the patient

## 2021-09-17 NOTE — Plan of Care (Signed)

## 2021-09-17 NOTE — H&P (Signed)
History and Physical    Tammy [name] PPI:[id number] DOB: 09-29-1961 DOA: 09/16/2021  PCP: Tammy Bien, MD   Patient coming from: Home  Chief Complaint: fall at home, confused  HPI: Tammy [name] is a 60 y.o. female with medical history significant for PAF on eliquis, depression/anxiety, hypothyroidism, iron deficiency anemia who presents by EMS after a fall at home yesterday.  Patient is encephalopathic and unable to provide any history.  No family at bedside.  Patient is lethargic and somnolent but will briefly open eyes halfway when aroused.  Was reported that she was on the ground for approximately 2 hours at home before she was found.  Is been was in the emergency room earlier and reported the ER PA that she was in her normal state of health on the morning of September 16, 2021.  He had gone to the store and when he returned she was on the floor upstairs in the house.  He is wheelchair-bound and was unable to go upstairs so EMS was called.  Patient is on p.o. painkillers at home.  She had no respiratory distress or depression so was not given Narcan initially in the emergency room.  Reportedly patient does have a history of seizure in 2017 and reportedly had a negative work-up with neurology according to the chart at that time. No reported seizure activity at home or in the emergency room  ED Course:   [name] has remained encephalopathic in the emergency room and hemodynamically stable.  CT of the head and neck were negative for acute fracture or injury.  Chest rib and pelvic x-rays were negative.  Lab work showed a WBC of 8100 hemoglobin 9.5 hematocrit 33.1 that was mildly hypochronic and microcytic, platelet 314,000 CK 557, sodium 133 potassium 5.4 but mildly hemolyzed chloride 101 bicarb 21 creatinine 0.98 BUN 11 calcium 8.7 glucose 99 alkaline phosphatase 76 AST 36 ALT 16 bilirubin 1.3, on all and aspirin levels negative, alcohol level negative UDS positive for opiates, UA is  clear yellow with small hemoglobin negative ketones negative leukocytes positive nitrites.  Hospital service was asked to admit for further management.  I have ordered MRI of the brain which is pending.  Neurology has been consulted and will evaluate patient and provide recommendations.  Review of Systems:  Unable to obtain secondary to encephalopathy  Past Medical History:  Diagnosis Date   Anxiety    Arthritis    neck and lower back - deg disc disease, fingers   Depression    Frequency of urination    GERD (gastroesophageal reflux disease)    History of seizure per pt no seizure since    11-07-2015  in setting of stress and sleep deprivation  /  negative work-up by neurologist unknown idiology   Hypothyroidism    Injury of thumb, left    03-31-2016     Pelvic relaxation    SUI (stress urinary incontinence, female)     Past Surgical History:  Procedure Laterality Date   ANTERIOR AND POSTERIOR REPAIR  09/25/2012   Procedure: ANTERIOR (CYSTOCELE) AND POSTERIOR REPAIR (RECTOCELE);  Surgeon: Tammy Crocker, MD;  Location: [facility];  Service: Gynecology;  Laterality: N/A;   ANTERIOR AND POSTERIOR REPAIR WITH SACROSPINOUS FIXATION N/A 04/12/2016   Procedure: ANTERIOR AND POSTERIOR REPAIR WITH SACROSPINOUS LIGAMENT SUSPENSION, CYSTOSCOPY;  Surgeon: Tammy Nigh, MD;  Location: [facility];  Service: Gynecology;  Laterality: N/A;   APPENDECTOMY  1987   BREAST EXCISIONAL BIOPSY Left  No scar visable    BREAST REDUCTION SURGERY  2000  aprrox   ENDOMETRIAL ABLATION W/ NOVASURE  2011   in office   EXCISION LEFT BREAST BX  12-07-1999   benign   LAPAROSCOPIC CHOLECYSTECTOMY  02-09-2005   LAPAROSCOPIC VAGINAL HYSTERECTOMY WITH SALPINGO OOPHORECTOMY Bilateral 04/12/2016   Procedure: LAPAROSCOPIC ASSISTED VAGINAL HYSTERECTOMY WITH SALPINGO OOPHORECTOMY;  Surgeon: Tammy Nigh, MD;  Location: [facility];  Service: Gynecology;  Laterality: Bilateral;   ORIF RADIAL  FRACTURE Left 02/09/2021   Procedure: ORIF LEFT DISTAL RADIUS FRACTURE;  Surgeon: Tammy Presume, MD;  Location: [facility];  Service: Plastics;  Laterality: Left;  90 min   REDUCTION MAMMAPLASTY Bilateral    ROUX-EN-Y GASTRIC BYPASS  06/  2002   TUBAL LIGATION  10/ 1990    Social History  reports that she has never smoked. She has never used smokeless tobacco. She reports that she does not drink alcohol and does not use drugs.  Allergies  Allergen Reactions   Nsaids Other (See Comments)    Post Gastric Bypass   Adhesive [Tape] Itching and Rash   Latex Itching, Rash and Other (See Comments)   Sulfa Antibiotics Itching and Rash    Family History  Problem Relation Age of Onset   Colon polyps Mother    Heart disease Father 22       CABG   Breast cancer Maternal Aunt    Stomach cancer Maternal Grandmother    Diabetes Maternal Grandmother    Heart disease Maternal Grandmother    Stomach cancer Maternal Uncle    Colon cancer Maternal Uncle        x3   Diabetes Maternal Aunt        Type I   Diabetes Maternal Uncle        x5 - Type II   Heart disease Paternal Grandmother    Esophageal cancer Neg Hx    Gallbladder disease Neg Hx    Seizures Neg Hx      Prior to Admission medications   Medication Sig Start Date End Date Taking? Authorizing Provider  buPROPion (WELLBUTRIN XL) 150 MG 24 hr tablet Take 450 mg by mouth daily. 12/17/19   [provider]  celecoxib (CELEBREX) 200 MG capsule Take 1 capsule (200 mg total) by mouth 2 (two) times daily. 03/19/21   Tammy Presume, MD  clonazePAM (KLONOPIN) 1 MG tablet Take 1 mg by mouth 3 (three) times daily as needed for anxiety.  03/24/20   [provider]  clotrimazole (LOTRIMIN) 1 % cream Apply to affected area 2 times daily 07/29/21   Couture, Cortni S, PA-C  ELIQUIS 5 MG TABS tablet TAKE ONE TABLET BY MOUTH TWICE A DAY 08/31/21   Minus Breeding, MD  FLUoxetine (PROZAC) 20 MG capsule Take 20 mg by mouth at bedtime.   07/26/18   [provider]  gabapentin (NEURONTIN) 600 MG tablet Take 1,200 mg by mouth 3 (three) times daily.    [provider]  HYDROcodone-acetaminophen (NORCO) 5-325 MG tablet Take 1 tablet by mouth every 6 (six) hours as needed for moderate pain. 02/25/21   Tammy Presume, MD  KLOR-CON M20 20 MEQ tablet TAKE ONE TABLET BY MOUTH DAILY 08/06/21   Minus Breeding, MD  Melatonin 10 MG TABS Take 10 mg by mouth at bedtime as needed (sleep).    [provider]  metoprolol succinate (TOPROL-XL) 25 MG 24 hr tablet Take 1 tablet (25 mg total) by mouth at bedtime.  03/27/21   Minus Breeding, MD  OLANZapine (ZYPREXA) 10 MG tablet Take 10 mg by mouth at bedtime. 04/04/19   [provider]  ondansetron (ZOFRAN) 4 MG tablet Take 1 tablet (4 mg total) by mouth every 8 (eight) hours as needed for nausea or vomiting. 02/09/21   Scheeler, Carola Rhine, PA-C    Physical Exam: Vitals:   09/16/21 1959  BP: 135/83  Pulse: 83  Resp: 16  Temp: 98 F (36.7 C)  SpO2: 100%    Constitutional: NAD, calm, comfortable Vitals:   09/16/21 1959  BP: 135/83  Pulse: 83  Resp: 16  Temp: 98 F (36.7 C)  SpO2: 100%   General: WDWN, somnolent, mumbles and briefly opens eyes to stimulation Eyes: Pupils  3 mm and sluggish. conjunctivae normal.  Sclera nonicteric HENT:  [place]/AT, external ears normal.  Nares patent without epistasis.  Mucous membranes are dry Neck: Soft, normal passive range of motion, supple, no masses, Trachea midline Respiratory: clear to auscultation bilaterally, no wheezing, no crackles. Normal respiratory effort. No accessory muscle use.  Cardiovascular: Regular rate and rhythm, no murmurs / rubs / gallops. No extremity edema. 2+ pedal pulses.  Abdomen: Soft, no tenderness, nondistended, no rebound or guarding.  No masses palpated. Obese. Bowel sounds normoactive Musculoskeletal: Normal passive range of motion. no cyanosis. No joint deformity upper and lower  extremities. Skin: Warm, dry, intact no rashes, lesions, ulcers. No induration Neurologic: Mumbles incoherently.  Patella DTR +1 bilaterally.  Babinski downgoing bilaterally.  No tremors.   Labs on Admission: I have personally reviewed following labs and imaging studies  CBC: Recent Labs  Lab 09/16/21 2031  WBC 8.1  NEUTROABS 6.2  HGB 9.5*  HCT 33.1*  MCV 76.4*  PLT 809    Basic Metabolic Panel: Recent Labs  Lab 09/16/21 2031  NA 133*  K 5.4*  CL 101  CO2 21*  GLUCOSE 99  BUN 11  CREATININE 0.98  CALCIUM 8.7*    GFR: CrCl cannot be calculated (Unknown ideal weight.).  Liver Function Tests: Recent Labs  Lab 09/16/21 2031  AST 36  ALT 16  ALKPHOS 76  BILITOT 1.3*  PROT 6.9  ALBUMIN 3.5    Urine analysis:    Component Value Date/Time   COLORURINE YELLOW 09/16/2021 2230   APPEARANCEUR CLEAR 09/16/2021 2230   LABSPEC <1.005 (L) 09/16/2021 2230   PHURINE 5.5 09/16/2021 2230   GLUCOSEU NEGATIVE 09/16/2021 2230   HGBUR SMALL (A) 09/16/2021 2230   BILIRUBINUR NEGATIVE 09/16/2021 2230   KETONESUR NEGATIVE 09/16/2021 2230   PROTEINUR NEGATIVE 09/16/2021 2230   UROBILINOGEN 1.0 03/13/2017 1637   NITRITE POSITIVE (A) 09/16/2021 2230   LEUKOCYTESUR NEGATIVE 09/16/2021 2230    Radiological Exams on Admission: DG Ribs Bilateral W/Chest  Result Date: 09/16/2021 CLINICAL DATA:  [place] EXAM: BILATERAL RIBS AND CHEST - 4+ VIEW COMPARISON:  07/29/2021 FINDINGS: Frontal view of the chest as well as frontal and oblique views of the bilateral thoracic cage are obtained. Cardiac silhouette remains enlarged. No airspace disease, effusion, or pneumothorax. There are no acute displaced fractures. IMPRESSION: 1. No acute intrathoracic process.  No displaced rib fractures. Electronically Signed   By: Randa Ngo M.D.   On: 09/16/2021 22:41   DG Lumbar Spine Complete  Result Date: 09/16/2021 CLINICAL DATA:  [place] EXAM: LUMBAR SPINE - COMPLETE 4+ VIEW COMPARISON:  03/26/2020  FINDINGS: Frontal, bilateral oblique, lateral views of the lumbar spine are obtained. There are 5 non-rib-bearing lumbar type vertebral bodies in normal alignment. Stable  multilevel spondylosis and facet hypertrophy, most pronounced at L4-5 and L5-S1. No acute displaced fracture. Sacroiliac joints are unremarkable. IMPRESSION: 1. Stable lower lumbar spondylosis and facet hypertrophy. No acute fracture. Electronically Signed   By: Randa Ngo M.D.   On: 09/16/2021 22:43   CT HEAD WO CONTRAST (5MM)  Result Date: 09/16/2021 CLINICAL DATA:  [place], down for 2 hours, anticoagulated EXAM: CT HEAD WITHOUT CONTRAST TECHNIQUE: Contiguous axial images were obtained from the base of the skull through the vertex without intravenous contrast. COMPARISON:  12/22/2020 FINDINGS: Brain: No acute infarct or hemorrhage. Lateral ventricles and midline structures are unremarkable. No acute extra-axial fluid collections. No mass effect. Vascular: No hyperdense vessel or unexpected calcification. Skull: Normal. Negative for fracture or focal lesion. Sinuses/Orbits: Mild polypoid mucosal thickening within the maxillary sinuses. Remaining paranasal sinuses are clear. Other: None. IMPRESSION: 1. No acute intracranial process. Electronically Signed   By: Randa Ngo M.D.   On: 09/16/2021 22:09   CT Cervical Spine Wo Contrast  Result Date: 09/16/2021 CLINICAL DATA:  [place], down for 2 hours EXAM: CT CERVICAL SPINE WITHOUT CONTRAST TECHNIQUE: Multidetector CT imaging of the cervical spine was performed without intravenous contrast. Multiplanar CT image reconstructions were also generated. COMPARISON:  None. FINDINGS: Alignment: Stable straightening of the cervical spine. Otherwise alignment is anatomic. Skull base and vertebrae: No acute fracture. No primary bone lesion or focal pathologic process. Soft tissues and spinal canal: No prevertebral fluid or swelling. No visible canal hematoma. Disc levels: Mild spondylosis from C4-5  through C6-7. Left predominant neural foraminal encroachment at C4-5, right predominant neural foraminal encroachment at C5-6, and symmetrical neural foraminal encroachment C6-7. Upper chest: Airway is patent.  Lung apices are clear. Other: Reconstructed images demonstrate no additional findings. IMPRESSION: 1. No acute cervical spine fracture. 2. Stable lower cervical spondylosis. Electronically Signed   By: Randa Ngo M.D.   On: 09/16/2021 22:12   DG Hip Unilat W or Wo Pelvis 2-3 Views Left  Result Date: 09/16/2021 CLINICAL DATA:  [place] EXAM: DG HIP (WITH OR WITHOUT PELVIS) 2-3V LEFT COMPARISON:  07/03/2016 FINDINGS: Frontal and frogleg lateral views of the left hip are obtained. No fracture, subluxation, or dislocation. Mild osteoarthritis. Visualized portions of the pelvis are unremarkable. IMPRESSION: 1. Mild left hip osteoarthritis.  No acute fracture. Electronically Signed   By: Randa Ngo M.D.   On: 09/16/2021 22:42   DG Hip Unilat W or Wo Pelvis 2-3 Views Right  Result Date: 09/16/2021 CLINICAL DATA:  [place], right hip pain EXAM: DG HIP (WITH OR WITHOUT PELVIS) 2-3V RIGHT COMPARISON:  03/24/2017 FINDINGS: Frontal view of the pelvis as well as frontal and frogleg lateral views of the right hip are obtained. No acute fracture, subluxation, or dislocation. Mild symmetrical bilateral hip osteoarthritis. The remainder of the bony pelvis is unremarkable. Soft tissues are normal. IMPRESSION: 1. Mild bilateral hip osteoarthritis.  No acute fracture. Electronically Signed   By: Randa Ngo M.D.   On: 09/16/2021 22:40    EKG: Independently reviewed.  EKG shows normal sinus rhythm with no acute ST elevation or depression.  QTc 476  Assessment/Plan Principal Problem:   Encephalopathy Ms. Ringley is admitted to Progressive Care unit for further evaluation of encephalopathy.  Neurology has been consulted and awaiting their evaluation and recommendations.  Obtain MRI brain to evaluate for  organic etiology or CVA.   Active Problems:   Hyponatremia Mildly decreased sodium level. Most likely SIADH from antidepressant use. Wellbutrin held while NPO. IVF hydration with LR at  125 ml/hr overnight. Recheck BMP in am    Iron deficiency anemia Has hx of Iron deficiency. Has mild microcytic hypochromic anemia. Start iron therapy when encephalopathy resolves and can take PO Recheck CBC in am    PAF Currently in sinus rhythm. Takes eliquis at home  DVT prophylaxis: SCDs for DVT prophylaxis. MRI brain to rule out CVA. If Negative may change to Eliquis once can tolerate PO intake safely Code Status:   Full Code  Family Communication:  No family at bedside.  Disposition Plan:   Patient is from:  Home  Anticipated DC to:  To be determined  Anticipated DC date:  Anticipate 2 midnight or more stay for workup and treatment  Consults called:  Neurology  Admission status:  Inpatient   Yevonne Aline Cristino Degroff MD [facility]  How to contact the [facility] Attending or Consulting provider [place] or covering provider during after hours [place], for this patient?   Check the care team in [facility] and look for a) attending/consulting [facility] provider listed and b) the [facility] team listed Log into [email or web address] and use [name] universal password to access. If you do not have the password, please contact the hospital operator. Locate the [facility] provider you are looking for under [facility] and page to a number that you can be directly reached. If you still have difficulty reaching the provider, please page the [facility] (Director on Call) for the Hospitalists listed on amion for assistance.  09/17/2021, 12:54 AM

## 2021-09-17 NOTE — Progress Notes (Signed)
Day 0 note    Tammy [name]  ALP:[id number] DOB: 01-19-1961 DOA: 09/16/2021 PCP: Fanny Bien, MD   Brief Narrative:  See admission note earlier today for full history, briefly patient was admitted for acute encephalopathy concerning for toxic versus metabolic versus polypharmacy in etiology with multiple falls that appear to be negative for trauma given imaging overnight.  Patient's mental status continues to slowly improve, appreciate neurology recommendations and insight, EEG and MRI pending.   Assessment & Plan:   Acute metabolic vs toxic enceph Rule out polypharmacy UDS positive for opiates Ammonia, TSH, UA/Urinalysis, tylenol, aspirin, viral panel unremarkable  Mech fall without trauma  Hyponatremia, mild, resolved Secondary to poor PO intake  Chronic iron deficient anemia, mild   Objective: Vitals:   09/16/21 1959 09/17/21 0500  BP: 135/83 126/66  Pulse: 83 82  Resp: 16 (!) 24  Temp: 98 F (36.7 C)   SpO2: 100% 100%    Intake/Output Summary (Last 24 hours) at 09/17/2021 0759 Last data filed at 09/17/2021 0048 Gross per 24 hour  Intake 1000 ml  Output --  Net 1000 ml   There were no vitals filed for this visit.  Examination:  General exam: Appears calm and comfortable, sluggish to respond but answers orientation questions appropriately Respiratory system: Clear to auscultation. Respiratory effort normal. Cardiovascular system: S1 & S2 heard, RRR. No JVD, murmurs, rubs, gallops or clicks. No pedal edema. Gastrointestinal system: Abdomen is nondistended, soft and nontender. No organomegaly or masses felt. Normal bowel sounds heard. Central nervous system: Alert and oriented. No focal neurological deficits. Extremities: Symmetric 5 x 5 power. Skin: No rashes, lesions or ulcers  Data Reviewed: I have personally reviewed following labs and imaging studies  CBC: Recent Labs  Lab 09/16/21 2031 09/17/21 0307  WBC 8.1 5.9  NEUTROABS 6.2  --   HGB  9.5* 10.2*  HCT 33.1* 38.9  MCV 76.4* 83.7  PLT 314 353   Basic Metabolic Panel: Recent Labs  Lab 09/16/21 2031 09/17/21 0307  NA 133* 140  K 5.4* 4.2  CL 101 106  CO2 21* 24  GLUCOSE 99 100*  BUN 11 8  CREATININE 0.98 0.77  CALCIUM 8.7* 8.9   GFR: CrCl cannot be calculated (Unknown ideal weight.). Liver Function Tests: Recent Labs  Lab 09/16/21 2031  AST 36  ALT 16  ALKPHOS 76  BILITOT 1.3*  PROT 6.9  ALBUMIN 3.5   No results for input(s): LIPASE, AMYLASE in the last 168 hours. Recent Labs  Lab 09/16/21 2340  AMMONIA 28   Coagulation Profile: No results for input(s): INR, PROTIME in the last 168 hours. Cardiac Enzymes: Recent Labs  Lab 09/16/21 2031  CKTOTAL 557*   BNP (last 3 results) No results for input(s): PROBNP in the last 8760 hours. HbA1C: No results for input(s): HGBA1C in the last 72 hours. CBG: No results for input(s): GLUCAP in the last 168 hours. Lipid Profile: No results for input(s): CHOL, HDL, LDLCALC, TRIG, CHOLHDL, LDLDIRECT in the last 72 hours. Thyroid Function Tests: Recent Labs    09/17/21 0307  TSH 0.681   Anemia Panel: No results for input(s): VITAMINB12, FOLATE, FERRITIN, TIBC, IRON, RETICCTPCT in the last 72 hours. Sepsis Labs: No results for input(s): PROCALCITON, LATICACIDVEN in the last 168 hours.  Recent Results (from the past 240 hour(s))  Resp Panel by RT-PCR (Flu A&B, Covid) Nasopharyngeal Swab     Status: None   Collection Time: 09/17/21 12:20 AM   Specimen: Nasopharyngeal Swab; Nasopharyngeal(NP) swabs  in vial transport medium  Result Value Ref Range Status   SARS Coronavirus 2 by RT PCR NEGATIVE NEGATIVE Final    Comment: (NOTE) SARS-CoV-2 target nucleic acids are NOT DETECTED.  The SARS-CoV-2 RNA is generally detectable in upper respiratory specimens during the acute phase of infection. The lowest concentration of SARS-CoV-2 viral copies this assay can detect is 138 copies/mL. A negative result does not  preclude SARS-Cov-2 infection and should not be used as the sole basis for treatment or other patient management decisions. A negative result may occur with  improper specimen collection/handling, submission of specimen other than nasopharyngeal swab, presence of viral mutation(s) within the areas targeted by this assay, and inadequate number of viral copies(<138 copies/mL). A negative result must be combined with clinical observations, patient history, and epidemiological information. The expected result is Negative.  Fact Sheet for Patients:  EntrepreneurPulse.com.au  Fact Sheet for Healthcare Providers:  IncredibleEmployment.be  This test is no t yet approved or cleared by the [place] FDA and  has been authorized for detection and/or diagnosis of SARS-CoV-2 by FDA under an Emergency Use Authorization (EUA). This EUA will remain  in effect (meaning this test can be used) for the duration of the COVID-19 declaration under Section 564(b)(1) of the Act, 21 U.S.C.section 360bbb-3(b)(1), unless the authorization is terminated  or revoked sooner.       Influenza A by PCR NEGATIVE NEGATIVE Final   Influenza B by PCR NEGATIVE NEGATIVE Final    Comment: (NOTE) The Xpert Xpress SARS-CoV-2/FLU/RSV plus assay is intended as an aid in the diagnosis of influenza from Nasopharyngeal swab specimens and should not be used as a sole basis for treatment. Nasal washings and aspirates are unacceptable for Xpert Xpress SARS-CoV-2/FLU/RSV testing.  Fact Sheet for Patients: EntrepreneurPulse.com.au  Fact Sheet for Healthcare Providers: IncredibleEmployment.be  This test is not yet approved or cleared by the [place] FDA and has been authorized for detection and/or diagnosis of SARS-CoV-2 by FDA under an Emergency Use Authorization (EUA). This EUA will remain in effect (meaning this test can be used) for the  duration of the COVID-19 declaration under Section 564(b)(1) of the Act, 21 U.S.C. section 360bbb-3(b)(1), unless the authorization is terminated or revoked.  Performed at [facility], [place] [address], [place], [place] [address]          Radiology Studies: DG Ribs Bilateral W/Chest  Result Date: 09/16/2021 CLINICAL DATA:  Fall EXAM: BILATERAL RIBS AND CHEST - 4+ VIEW COMPARISON:  07/29/2021 FINDINGS: Frontal view of the chest as well as frontal and oblique views of the bilateral thoracic cage are obtained. Cardiac silhouette remains enlarged. No airspace disease, effusion, or pneumothorax. There are no acute displaced fractures. IMPRESSION: 1. No acute intrathoracic process.  No displaced rib fractures. Electronically Signed   By: Randa Ngo M.D.   On: 09/16/2021 22:41   DG Lumbar Spine Complete  Result Date: 09/16/2021 CLINICAL DATA:  [place] EXAM: LUMBAR SPINE - COMPLETE 4+ VIEW COMPARISON:  03/26/2020 FINDINGS: Frontal, bilateral oblique, lateral views of the lumbar spine are obtained. There are 5 non-rib-bearing lumbar type vertebral bodies in normal alignment. Stable multilevel spondylosis and facet hypertrophy, most pronounced at L4-5 and L5-S1. No acute displaced fracture. Sacroiliac joints are unremarkable. IMPRESSION: 1. Stable lower lumbar spondylosis and facet hypertrophy. No acute fracture. Electronically Signed   By: Randa Ngo M.D.   On: 09/16/2021 22:43   CT HEAD WO CONTRAST (5MM)  Result Date: 09/16/2021 CLINICAL DATA:  [place], down for  2 hours, anticoagulated EXAM: CT HEAD WITHOUT CONTRAST TECHNIQUE: Contiguous axial images were obtained from the base of the skull through the vertex without intravenous contrast. COMPARISON:  12/22/2020 FINDINGS: Brain: No acute infarct or hemorrhage. Lateral ventricles and midline structures are unremarkable. No acute extra-axial fluid collections. No mass effect. Vascular: No hyperdense vessel or unexpected calcification. Skull:  Normal. Negative for fracture or focal lesion. Sinuses/Orbits: Mild polypoid mucosal thickening within the maxillary sinuses. Remaining paranasal sinuses are clear. Other: None. IMPRESSION: 1. No acute intracranial process. Electronically Signed   By: Randa Ngo M.D.   On: 09/16/2021 22:09   CT Cervical Spine Wo Contrast  Result Date: 09/16/2021 CLINICAL DATA:  [place], down for 2 hours EXAM: CT CERVICAL SPINE WITHOUT CONTRAST TECHNIQUE: Multidetector CT imaging of the cervical spine was performed without intravenous contrast. Multiplanar CT image reconstructions were also generated. COMPARISON:  None. FINDINGS: Alignment: Stable straightening of the cervical spine. Otherwise alignment is anatomic. Skull base and vertebrae: No acute fracture. No primary bone lesion or focal pathologic process. Soft tissues and spinal canal: No prevertebral fluid or swelling. No visible canal hematoma. Disc levels: Mild spondylosis from C4-5 through C6-7. Left predominant neural foraminal encroachment at C4-5, right predominant neural foraminal encroachment at C5-6, and symmetrical neural foraminal encroachment C6-7. Upper chest: Airway is patent.  Lung apices are clear. Other: Reconstructed images demonstrate no additional findings. IMPRESSION: 1. No acute cervical spine fracture. 2. Stable lower cervical spondylosis. Electronically Signed   By: Randa Ngo M.D.   On: 09/16/2021 22:12   DG Hip Unilat W or Wo Pelvis 2-3 Views Left  Result Date: 09/16/2021 CLINICAL DATA:  [place] EXAM: DG HIP (WITH OR WITHOUT PELVIS) 2-3V LEFT COMPARISON:  07/03/2016 FINDINGS: Frontal and frogleg lateral views of the left hip are obtained. No fracture, subluxation, or dislocation. Mild osteoarthritis. Visualized portions of the pelvis are unremarkable. IMPRESSION: 1. Mild left hip osteoarthritis.  No acute fracture. Electronically Signed   By: Randa Ngo M.D.   On: 09/16/2021 22:42   DG Hip Unilat W or Wo Pelvis 2-3 Views  Right  Result Date: 09/16/2021 CLINICAL DATA:  [place], right hip pain EXAM: DG HIP (WITH OR WITHOUT PELVIS) 2-3V RIGHT COMPARISON:  03/24/2017 FINDINGS: Frontal view of the pelvis as well as frontal and frogleg lateral views of the right hip are obtained. No acute fracture, subluxation, or dislocation. Mild symmetrical bilateral hip osteoarthritis. The remainder of the bony pelvis is unremarkable. Soft tissues are normal. IMPRESSION: 1. Mild bilateral hip osteoarthritis.  No acute fracture. Electronically Signed   By: Randa Ngo M.D.   On: 09/16/2021 22:40    Scheduled Meds: Continuous Infusions:  lactated ringers 125 mL/hr at 09/17/21 0617     LOS: 0 days   Time spent: 22min  Daniesha Driver C Fuller Makin, DO [facility]  If 7PM-7AM, please contact night-coverage [email or web address]  09/17/2021, 7:59 AM

## 2021-09-17 NOTE — Progress Notes (Signed)
EEG complete - results pending 

## 2021-09-17 NOTE — Procedures (Signed)
Patient Name: Tammy [name]  MRN: [id number]  Epilepsy Attending: Lora Havens  Referring Physician/Provider: Dr Amie [place] Date: 09/17/2021 Duration: 22.30 mins  Patient history: 60 year old female with altered mental status.  EEG to evaluate for seizure.  Level of alertness: Awake, asleep  AEDs during EEG study: None  Technical aspects: This EEG study was done with scalp electrodes positioned according to the 10-20 International system of electrode placement. Electrical activity was acquired at a sampling rate of 500Hz  and reviewed with a high frequency filter of 70Hz  and a low frequency filter of 1Hz . EEG data were recorded continuously and digitally stored.   Description: The posterior dominant rhythm consists of 9Hz  activity of moderate voltage (25-35 uV) seen predominantly in posterior head regions, symmetric and reactive to eye opening and eye closing. Sleep was characterized by vertex waves, sleep spindles (12 to 14 Hz), maximal frontocentral region.  Hyperventilation and photic stimulation were not performed.     IMPRESSION: This study is within normal limits. No seizures or epileptiform discharges were seen throughout the recording.  [name] [name]

## 2021-09-17 NOTE — ED Provider Notes (Signed)
Patient is a 61 year old female whose care was transferred to me at shift change from [facility].  Please see her note below for additional information.  In summary, patient came to the emergency department due to what appears to be a fall as well as encephalopathy likely secondary to polypharmacy versus unknown etiology.  Patient discussed with the medicine team who will accept her for admission.  At the time of shift change they recommend neurology consultation as well.  I have discussed the patient with Dr. Marcelle Overlie with neurology who will evaluate the patient as well.   Rayna Sexton, PA-C 09/17/21 Lavell Luster, MD 09/17/21 [phone]

## 2021-09-18 NOTE — Plan of Care (Signed)

## 2021-09-18 NOTE — Plan of Care (Signed)
Neurology plan of care note-------------NO CHARGE.   Patient is much more easily aroused this am. States she wants to go home. Has no complaints. No pain, weakness, numbness, tingling, HA, or change in vision. NP cautioned her about taking sedative medications with Narcotics.   EEG without epileptogenicity or seizure.  MRI brain unremarkable.   Patient can be discharged from our standpoint, but will leave that to medicine team. Try to cut down on polypharmacy.   Discussed with Dr. Rory Percy.   Neurology will be available prn for questions.   Clance Boll, NP Neurology

## 2021-09-18 NOTE — Progress Notes (Signed)
Order to discharge pt home.  Discharge instructions/AVS given to patient and reviewed - education provided as needed.  Pt advised to call PCP and/or come back to the hospital if there are any problems. Pt verbalized understanding.    
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
## 2021-10-13 ENCOUNTER — Other Ambulatory Visit: Payer: Self-pay

## 2021-10-13 ENCOUNTER — Emergency Department (HOSPITAL_BASED_OUTPATIENT_CLINIC_OR_DEPARTMENT_OTHER): Payer: Medicaid Other

## 2021-10-13 ENCOUNTER — Encounter (HOSPITAL_BASED_OUTPATIENT_CLINIC_OR_DEPARTMENT_OTHER): Payer: Self-pay

## 2021-10-13 ENCOUNTER — Emergency Department (HOSPITAL_BASED_OUTPATIENT_CLINIC_OR_DEPARTMENT_OTHER)
Admission: EM | Admit: 2021-10-13 | Discharge: 2021-10-13 | Disposition: A | Discharge status: Home / Self Care | Payer: Medicaid Other | Attending: Emergency Medicine | Admitting: Emergency Medicine

## 2021-10-13 DIAGNOSIS — Z79899 Other long term (current) drug therapy: Secondary | ICD-10-CM | POA: Insufficient documentation

## 2021-10-13 DIAGNOSIS — M25551 Pain in right hip: Secondary | ICD-10-CM | POA: Diagnosis not present

## 2021-10-13 DIAGNOSIS — M542 Cervicalgia: Secondary | ICD-10-CM | POA: Diagnosis not present

## 2021-10-13 DIAGNOSIS — Y92410 Unspecified street and highway as the place of occurrence of the external cause: Secondary | ICD-10-CM | POA: Insufficient documentation

## 2021-10-13 DIAGNOSIS — Z91040 Latex allergy status: Secondary | ICD-10-CM | POA: Insufficient documentation

## 2021-10-13 DIAGNOSIS — Z7901 Long term (current) use of anticoagulants: Secondary | ICD-10-CM | POA: Insufficient documentation

## 2021-10-13 DIAGNOSIS — R109 Unspecified abdominal pain: Secondary | ICD-10-CM | POA: Diagnosis not present

## 2021-10-13 DIAGNOSIS — E039 Hypothyroidism, unspecified: Secondary | ICD-10-CM | POA: Diagnosis not present

## 2021-10-13 DIAGNOSIS — R0789 Other chest pain: Secondary | ICD-10-CM | POA: Diagnosis not present

## 2021-10-13 DIAGNOSIS — S0990XA Unspecified injury of head, initial encounter: Secondary | ICD-10-CM | POA: Insufficient documentation

## 2021-10-13 LAB — CBC
HCT: 31.7 % — ABNORMAL LOW (ref 36.0–46.0)
Hemoglobin: 9.2 g/dL — ABNORMAL LOW (ref 12.0–15.0)
MCH: 21.4 pg — ABNORMAL LOW (ref 26.0–34.0)
MCHC: 29 g/dL — ABNORMAL LOW (ref 30.0–36.0)
MCV: 73.7 fL — ABNORMAL LOW (ref 80.0–100.0)
Platelets: 329 10*3/uL (ref 150–400)
RBC: 4.3 MIL/uL (ref 3.87–5.11)
RDW: 18.3 % — ABNORMAL HIGH (ref 11.5–15.5)
WBC: 6.1 10*3/uL (ref 4.0–10.5)
nRBC: 0 % (ref 0.0–0.2)

## 2021-10-13 LAB — COMPREHENSIVE METABOLIC PANEL
ALT: 12 U/L (ref 0–44)
AST: 17 U/L (ref 15–41)
Albumin: 4.3 g/dL (ref 3.5–5.0)
Alkaline Phosphatase: 77 U/L (ref 38–126)
Anion gap: 9 (ref 5–15)
BUN: 12 mg/dL (ref 6–20)
CO2: 30 mmol/L (ref 22–32)
Calcium: 9.3 mg/dL (ref 8.9–10.3)
Chloride: 99 mmol/L (ref 98–111)
Creatinine, Ser: 0.95 mg/dL (ref 0.44–1.00)
GFR, Estimated: >60 mL/min (ref >60)
Glucose, Bld: 96 mg/dL (ref 70–99)
Potassium: 3.8 mmol/L (ref 3.5–5.1)
Sodium: 138 mmol/L (ref 135–145)
Total Bilirubin: 0.6 mg/dL (ref 0.3–1.2)
Total Protein: 7.6 g/dL (ref 6.5–8.1)

## 2021-10-13 LAB — LIPASE, BLOOD: Lipase: <10 U/L — ABNORMAL LOW (ref 11–51)

## 2021-10-13 MED ORDER — FENTANYL CITRATE PF 50 MCG/ML IJ SOSY
50.0000 ug | PREFILLED_SYRINGE | Freq: Once | INTRAMUSCULAR | Status: AC
Start: 2021-10-13 — End: 2021-10-13
  Administered 2021-10-13: 50 ug via INTRAVENOUS
  Filled 2021-10-13: qty 1

## 2021-10-13 MED ORDER — LIDOCAINE 5 % EX PTCH: 1.0000 | MEDICATED_PATCH | CUTANEOUS | 0 refills | Status: AC

## 2021-10-13 MED ORDER — IOHEXOL 300 MG/ML  SOLN
100.0000 mL | Freq: Once | INTRAMUSCULAR | Status: AC | PRN
  Administered 2021-10-13: 100 mL via INTRAVENOUS

## 2021-10-13 NOTE — ED Notes (Signed)
Patient transported to CT 
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
## 2021-10-24 ENCOUNTER — Other Ambulatory Visit: Payer: Self-pay

## 2021-10-24 ENCOUNTER — Observation Stay (HOSPITAL_COMMUNITY)
Admission: EM | Admit: 2021-10-24 | Discharge: 2021-10-25 | Disposition: A | Discharge status: Home / Self Care | Payer: Self-pay | Attending: Family Medicine | Admitting: Family Medicine

## 2021-10-24 ENCOUNTER — Observation Stay (HOSPITAL_COMMUNITY): Payer: Self-pay

## 2021-10-24 ENCOUNTER — Encounter (HOSPITAL_COMMUNITY): Payer: Self-pay

## 2021-10-24 ENCOUNTER — Emergency Department (HOSPITAL_COMMUNITY): Payer: Self-pay

## 2021-10-24 DIAGNOSIS — F32A Depression, unspecified: Secondary | ICD-10-CM | POA: Diagnosis present

## 2021-10-24 DIAGNOSIS — I480 Paroxysmal atrial fibrillation: Secondary | ICD-10-CM | POA: Diagnosis present

## 2021-10-24 DIAGNOSIS — G9340 Encephalopathy, unspecified: Secondary | ICD-10-CM | POA: Diagnosis not present

## 2021-10-24 DIAGNOSIS — N390 Urinary tract infection, site not specified: Secondary | ICD-10-CM | POA: Insufficient documentation

## 2021-10-24 DIAGNOSIS — G4733 Obstructive sleep apnea (adult) (pediatric): Secondary | ICD-10-CM

## 2021-10-24 DIAGNOSIS — B9620 Unspecified Escherichia coli [E. coli] as the cause of diseases classified elsewhere: Secondary | ICD-10-CM | POA: Insufficient documentation

## 2021-10-24 DIAGNOSIS — E039 Hypothyroidism, unspecified: Secondary | ICD-10-CM | POA: Insufficient documentation

## 2021-10-24 DIAGNOSIS — M545 Low back pain, unspecified: Secondary | ICD-10-CM

## 2021-10-24 DIAGNOSIS — F411 Generalized anxiety disorder: Secondary | ICD-10-CM | POA: Diagnosis present

## 2021-10-24 DIAGNOSIS — Z7901 Long term (current) use of anticoagulants: Secondary | ICD-10-CM | POA: Insufficient documentation

## 2021-10-24 DIAGNOSIS — Z20822 Contact with and (suspected) exposure to covid-19: Secondary | ICD-10-CM | POA: Insufficient documentation

## 2021-10-24 DIAGNOSIS — R531 Weakness: Secondary | ICD-10-CM

## 2021-10-24 DIAGNOSIS — Z79899 Other long term (current) drug therapy: Secondary | ICD-10-CM | POA: Insufficient documentation

## 2021-10-24 DIAGNOSIS — R443 Hallucinations, unspecified: Secondary | ICD-10-CM | POA: Diagnosis present

## 2021-10-24 DIAGNOSIS — D509 Iron deficiency anemia, unspecified: Secondary | ICD-10-CM | POA: Diagnosis present

## 2021-10-24 DIAGNOSIS — Z91040 Latex allergy status: Secondary | ICD-10-CM | POA: Insufficient documentation

## 2021-10-24 LAB — CBC WITH DIFFERENTIAL/PLATELET
Abs Immature Granulocytes: 0.02 10*3/uL (ref 0.00–0.07)
Basophils Absolute: 0 10*3/uL (ref 0.0–0.1)
Basophils Relative: 0 %
Eosinophils Absolute: 0 10*3/uL (ref 0.0–0.5)
Eosinophils Relative: 0 %
HCT: 30.4 % — ABNORMAL LOW (ref 36.0–46.0)
Hemoglobin: 9 g/dL — ABNORMAL LOW (ref 12.0–15.0)
Immature Granulocytes: 0 %
Lymphocytes Relative: 13 %
Lymphs Abs: 0.9 10*3/uL (ref 0.7–4.0)
MCH: 22.1 pg — ABNORMAL LOW (ref 26.0–34.0)
MCHC: 29.6 g/dL — ABNORMAL LOW (ref 30.0–36.0)
MCV: 74.5 fL — ABNORMAL LOW (ref 80.0–100.0)
Monocytes Absolute: 0.6 10*3/uL (ref 0.1–1.0)
Monocytes Relative: 9 %
Neutro Abs: 5.4 10*3/uL (ref 1.7–7.7)
Neutrophils Relative %: 78 %
Platelets: 273 10*3/uL (ref 150–400)
RBC: 4.08 MIL/uL (ref 3.87–5.11)
RDW: 18.1 % — ABNORMAL HIGH (ref 11.5–15.5)
WBC: 7 10*3/uL (ref 4.0–10.5)
nRBC: 0 % (ref 0.0–0.2)

## 2021-10-24 LAB — COMPREHENSIVE METABOLIC PANEL
ALT: 17 U/L (ref 0–44)
AST: 29 U/L (ref 15–41)
Albumin: 3.5 g/dL (ref 3.5–5.0)
Alkaline Phosphatase: 84 U/L (ref 38–126)
Anion gap: 8 (ref 5–15)
BUN: 12 mg/dL (ref 6–20)
CO2: 24 mmol/L (ref 22–32)
Calcium: 8.7 mg/dL — ABNORMAL LOW (ref 8.9–10.3)
Chloride: 103 mmol/L (ref 98–111)
Creatinine, Ser: 0.97 mg/dL (ref 0.44–1.00)
GFR, Estimated: >60 mL/min (ref >60)
Glucose, Bld: 97 mg/dL (ref 70–99)
Potassium: 3.9 mmol/L (ref 3.5–5.1)
Sodium: 135 mmol/L (ref 135–145)
Total Bilirubin: 0.7 mg/dL (ref 0.3–1.2)
Total Protein: 7.1 g/dL (ref 6.5–8.1)

## 2021-10-24 LAB — URINALYSIS, ROUTINE W REFLEX MICROSCOPIC
Bilirubin Urine: NEGATIVE
Glucose, UA: NEGATIVE mg/dL
Ketones, ur: NEGATIVE mg/dL
Nitrite: POSITIVE — AB
Protein, ur: NEGATIVE mg/dL
Specific Gravity, Urine: 1.006 (ref 1.005–1.030)
pH: 6 (ref 5.0–8.0)

## 2021-10-24 LAB — VITAMIN B12: Vitamin B-12: 149 pg/mL — ABNORMAL LOW (ref 180–914)

## 2021-10-24 LAB — RESP PANEL BY RT-PCR (FLU A&B, COVID) ARPGX2
Influenza A by PCR: NEGATIVE
Influenza B by PCR: NEGATIVE
SARS Coronavirus 2 by RT PCR: NEGATIVE

## 2021-10-24 LAB — TROPONIN I (HIGH SENSITIVITY)
Troponin I (High Sensitivity): 6 ng/L (ref <18)
Troponin I (High Sensitivity): 6 ng/L (ref <18)

## 2021-10-24 LAB — MAGNESIUM: Magnesium: 2 mg/dL (ref 1.7–2.4)

## 2021-10-24 LAB — CK: Total CK: 1168 U/L — ABNORMAL HIGH (ref 38–234)

## 2021-10-24 LAB — AMMONIA: Ammonia: 16 umol/L (ref 9–35)

## 2021-10-24 MED ORDER — SODIUM CHLORIDE 0.9 % IV SOLN: INTRAVENOUS | Status: AC

## 2021-10-24 MED ORDER — ACETAMINOPHEN 650 MG RE SUPP: 650.0000 mg | Freq: Four times a day (QID) | RECTAL | Status: DC | PRN

## 2021-10-24 MED ORDER — ACETAMINOPHEN 325 MG PO TABS
650.0000 mg | ORAL_TABLET | Freq: Four times a day (QID) | ORAL | Status: DC | PRN
  Administered 2021-10-24 – 2021-10-25 (×3): 650 mg via ORAL
  Filled 2021-10-24 (×3): qty 2

## 2021-10-24 MED ORDER — METOPROLOL SUCCINATE ER 25 MG PO TB24
25.0000 mg | ORAL_TABLET | Freq: Every day | ORAL | Status: DC
  Administered 2021-10-24: 25 mg via ORAL
  Filled 2021-10-24: qty 1

## 2021-10-24 MED ORDER — CLONAZEPAM 1 MG PO TABS
1.0000 mg | ORAL_TABLET | Freq: Two times a day (BID) | ORAL | Status: DC | PRN
  Administered 2021-10-25: 1 mg via ORAL
  Filled 2021-10-24: qty 1

## 2021-10-24 MED ORDER — APIXABAN 5 MG PO TABS
5.0000 mg | ORAL_TABLET | Freq: Two times a day (BID) | ORAL | Status: DC
Start: 2021-10-24 — End: 2021-10-25
  Administered 2021-10-24 – 2021-10-25 (×2): 5 mg via ORAL
  Filled 2021-10-24 (×2): qty 1

## 2021-10-24 NOTE — Assessment & Plan Note (Addendum)
Very stressed and anxious per husband.  Unsure if has taken more klonopin than px and on high dose of wellbutrin +prozac +zyprexa. Fills correctly on pmp website  Hold medication for now except for her klonopin as to prevent withdrawal, will decrease down to BID prn  Psychiatry consulted

## 2021-10-24 NOTE — Assessment & Plan Note (Signed)
Untreated. Has tried multiple masks and unable to wear.

## 2021-10-24 NOTE — Assessment & Plan Note (Signed)
Hold meds for now, as polypharmacy could be contributing to her episodes of confusion  psychiatry has been consulted

## 2021-10-24 NOTE — Assessment & Plan Note (Addendum)
Under a lot of stress and has had increased confusion and new visual hallucinations with pressured and fast speech Psychiatry consulted Hold meds at this time

## 2021-10-24 NOTE — Assessment & Plan Note (Signed)
At baseline (9-10) Continue to monitor

## 2021-10-24 NOTE — Assessment & Plan Note (Addendum)
NSR. Last took her eliquis last night CHA2Ds2-Vasc of 2, continue eliquis  She is alert and oriented enough to eat/drink Continue toprol  Telemetry

## 2021-10-24 NOTE — Assessment & Plan Note (Signed)
History of weakness and recurrent falls Check lumbar xray, no radicular signs at this time to warrant MRI PT to eval/treat Fall precautions

## 2021-10-24 NOTE — Assessment & Plan Note (Addendum)
61 year old presenting with bilateral LE weakness/hallucinations and worsening confusion per her husband. Recently admitted in 12/22 for simliar presentation with neurological w/u with EEG/MRI that were normal, uneventful w/u and ? Polypharmacy.  -place in obs on telemetry -she has mentally cleared up and is able to answer questions correctly, follow directions and is alert and oriented except for date. Knows year/month. Speech is still pressured and fast.  -she has had no fever/no white count and no suspicion for infection. UA pending. CXR clear.  -CTH with no acute finding. Recent MRI 4 weeks ago with no acute finding and no neurological deficit on exam.  -metabolic labs wnl, check U54, UDS and CK with fall  -? If polypharmacy, stress and anxiety are contributing vs. Mood disorder. With her new hallucinations/pressured speech have consulted psychiatry. Holding home meds: zyprexa, wellbutrin, prozac. Will continue klonopin to prevent withdrawal and she is much more alert and oriented. -fall and delirium precautions

## 2021-10-24 NOTE — ED Provider Notes (Signed)
[facility] DEPT Provider Note   CSN: [id number] Arrival date & time: 10/24/21  1052     History  Chief Complaint  Patient presents with   Fall   Extremity Weakness    Tammy [name] is a 61 y.o. female with h/o polypharmacy, anxiety, depression, hypothyroidism, afib, encephalopathy,and morbid obesity presents to the ED via EMS for evaluation of AMS and multiple falls today. The patient is A&O x2. The majority of the history obtained by the patient's husband. The patient reports to me bilateral leg weakness. The husband was mentioning that she was confused and altered for the past 2 days. She was cooking dinner at 0600 and thinking that her daughters where in the house when they hadn't been there for a few weeks. The husband mentions that she has had two falls around 0700 and 1000 that required EMS to come lift her. The second time they brought her here. The husband reports he recently had an amputation and lives downstairs and his wife lives upstairs and he is unable to help her at this time.    Fall Pertinent negatives include no chest pain, no abdominal pain and no shortness of breath.  Extremity Weakness Pertinent negatives include no chest pain, no abdominal pain and no shortness of breath.      Home Medications Prior to Admission medications   Medication Sig Start Date End Date Taking? Authorizing Provider  buPROPion (WELLBUTRIN XL) 150 MG 24 hr tablet Take 450 mg by mouth daily. 12/17/19   [provider]  celecoxib (CELEBREX) 200 MG capsule Take 1 capsule (200 mg total) by mouth 2 (two) times daily. 03/19/21   Cindra Presume, MD  clonazePAM (KLONOPIN) 1 MG tablet Take 1 mg by mouth 3 (three) times daily as needed for anxiety.  03/24/20   [provider]  clotrimazole (LOTRIMIN) 1 % cream Apply to affected area 2 times daily Patient not taking: Reported on 10/13/2021 07/29/21   Couture, Cortni S, PA-C  ELIQUIS 5 MG TABS tablet TAKE  ONE TABLET BY MOUTH TWICE A DAY 08/31/21   Minus Breeding, MD  FLUoxetine (PROZAC) 40 MG capsule Take 40 mg by mouth daily. 06/23/21   [provider]  gabapentin (NEURONTIN) 600 MG tablet Take 1,200 mg by mouth 3 (three) times daily.    [provider]  KLOR-CON M20 20 MEQ tablet TAKE ONE TABLET BY MOUTH DAILY 08/06/21   Minus Breeding, MD  lidocaine (LIDODERM) 5 % Place 1 patch onto the skin daily for 15 days. Remove & Discard patch within 12 hours or as directed by MD 10/13/21 10/28/21  Sherre Poot, Adora Fridge, PA-C  Melatonin 10 MG TABS Take 10 mg by mouth at bedtime as needed (sleep).    [provider]  metoprolol succinate (TOPROL-XL) 25 MG 24 hr tablet Take 1 tablet (25 mg total) by mouth at bedtime. 03/27/21   Minus Breeding, MD  OLANZapine (ZYPREXA) 10 MG tablet Take 10 mg by mouth at bedtime. 04/04/19   [provider]  ondansetron (ZOFRAN) 4 MG tablet Take 1 tablet (4 mg total) by mouth every 8 (eight) hours as needed for nausea or vomiting. Patient not taking: Reported on 09/17/2021 02/09/21   Scheeler, Carola Rhine, PA-C      Allergies    Nsaids, Adhesive [tape], Latex, and Sulfa antibiotics    Review of Systems   Review of Systems  Respiratory:  Negative for shortness of breath.   Cardiovascular:  Negative for chest pain.  Gastrointestinal:  Negative for abdominal pain.  Musculoskeletal:  Positive for extremity weakness.  Neurological:  Positive for weakness.  Psychiatric/Behavioral:  Positive for hallucinations.    Physical Exam Updated Vital Signs BP (!) 141/73    Pulse 85    Temp 97.9 F (36.6 C) (Oral)    Resp 18    LMP 09/30/2012    SpO2 98%  Physical Exam Vitals and nursing note reviewed.  Constitutional:      General: She is not in acute distress.    Appearance: Normal appearance. She is not toxic-appearing.     Comments: Somnolent but arousable.   HENT:     Head: Normocephalic and atraumatic.     Mouth/Throat:     Mouth: Mucous  membranes are moist.  Eyes:     General: No scleral icterus.    Extraocular Movements: Extraocular movements intact.     Pupils: Pupils are equal, round, and reactive to light.  Cardiovascular:     Rate and Rhythm: Normal rate and regular rhythm.  Pulmonary:     Effort: Pulmonary effort is normal. No respiratory distress.     Breath sounds: Normal breath sounds. No wheezing.  Abdominal:     General: Abdomen is flat. Bowel sounds are normal.     Palpations: Abdomen is soft.     Tenderness: There is no abdominal tenderness. There is no guarding or rebound.  Musculoskeletal:        General: No deformity.     Cervical back: Normal range of motion.  Skin:    General: Skin is warm and dry.  Neurological:     General: No focal deficit present.     Mental Status: She is alert. Mental status is at baseline.     Cranial Nerves: No cranial nerve deficit.     Sensory: No sensory deficit.     Motor: Weakness present.     Comments: A&Ox2. GCS 14. Weakness to bilateral legs equally.    ED Results / Procedures / Treatments   Labs (all labs ordered are listed, but only abnormal results are displayed) Labs Reviewed  CBC WITH DIFFERENTIAL/PLATELET - Abnormal; Notable for the following components:      Result Value   Hemoglobin 9.0 (*)    HCT 30.4 (*)    MCV 74.5 (*)    MCH 22.1 (*)    MCHC 29.6 (*)    RDW 18.1 (*)    All other components within normal limits  URINALYSIS, ROUTINE W REFLEX MICROSCOPIC - Abnormal; Notable for the following components:   Hgb urine dipstick MODERATE (*)    Nitrite POSITIVE (*)    Leukocytes,Ua TRACE (*)    Bacteria, UA RARE (*)    All other components within normal limits  COMPREHENSIVE METABOLIC PANEL - Abnormal; Notable for the following components:   Calcium 8.7 (*)    All other components within normal limits  RAPID URINE DRUG SCREEN, HOSP PERFORMED - Abnormal; Notable for the following components:   Opiates POSITIVE (*)    Benzodiazepines POSITIVE  (*)    All other components within normal limits  VITAMIN B12 - Abnormal; Notable for the following components:   Vitamin B-12 149 (*)    All other components within normal limits  CK - Abnormal; Notable for the following components:   Total CK 1,168 (*)    All other components within normal limits  BASIC METABOLIC PANEL - Abnormal; Notable for the following components:   Glucose, Bld 101 (*)    Calcium  8.6 (*)    All other components within normal limits  CBC - Abnormal; Notable for the following components:   RBC 3.86 (*)    Hemoglobin 8.4 (*)    HCT 29.2 (*)    MCV 75.6 (*)    MCH 21.8 (*)    MCHC 28.8 (*)    RDW 18.5 (*)    All other components within normal limits  RESP PANEL BY RT-PCR (FLU A&B, COVID) ARPGX2  URINE CULTURE  MAGNESIUM  AMMONIA  TROPONIN I (HIGH SENSITIVITY)  TROPONIN I (HIGH SENSITIVITY)    EKG None  Radiology DG Lumbar Spine 2-3 Views  Result Date: 10/24/2021 CLINICAL DATA:  Generalized weakness in legs.  Low back pain. EXAM: LUMBAR SPINE - 2-3 VIEW COMPARISON:  09/16/2021 FINDINGS: Diffuse degenerative disc disease and facet disease. Normal alignment. Diffuse osteopenia. No fracture. SI joints symmetric and unremarkable. IMPRESSION: Osteopenia. Degenerative disc and facet disease. No acute bony abnormality. Electronically Signed   By: Rolm Baptise M.D.   On: 10/24/2021 17:04   CT Head Wo Contrast  Result Date: 10/24/2021 CLINICAL DATA:  Head trauma.  Moderate to severe. EXAM: CT HEAD WITHOUT CONTRAST TECHNIQUE: Contiguous axial images were obtained from the base of the skull through the vertex without intravenous contrast. RADIATION DOSE REDUCTION: This exam was performed according to the departmental dose-optimization program which includes automated exposure control, adjustment of the mA and/or kV according to patient size and/or use of iterative reconstruction technique. COMPARISON:  10/13/2021 FINDINGS: Brain: No evidence of acute infarction,  hemorrhage, hydrocephalus, extra-axial collection or mass lesion/mass effect.Prominence of the CSF spaces overlying the frontoparietal lobes identified suggestive of brain atrophy. Vascular: No hyperdense vessel or unexpected calcification. Skull: Normal. Negative for fracture or focal lesion. Sinuses/Orbits: Paranasal sinuses and mastoid air cells are clear Other: None IMPRESSION: 1. No acute intracranial abnormalities. 2. Mild brain atrophy. Electronically Signed   By: Kerby Moors M.D.   On: 10/24/2021 12:40   DG CHEST PORT 1 VIEW  Result Date: 10/24/2021 CLINICAL DATA:  Generalized weakness EXAM: PORTABLE CHEST 1 VIEW COMPARISON:  09/16/2021 FINDINGS: Heart is upper limits normal in size. No confluent airspace opacities, effusions or edema. No acute bony abnormality. IMPRESSION: No active disease. Electronically Signed   By: Rolm Baptise M.D.   On: 10/24/2021 17:03    Procedures Procedures   Medications Ordered in ED Medications - No data to display  ED Course/ Medical Decision Making/ A&P                           Medical Decision Making Amount and/or Complexity of Data Reviewed Labs: ordered. Radiology: ordered.  Risk Decision regarding hospitalization.  61 y/o F presents to the ED for evaluation of AMS. Differential diagnosis includes but is not limited to electrolyte abnormality, conversion disorder, UTI, polypharmacy, stroke, generalized deconditioning.Vital signs are unremarkable. Physical exam pertinent for oriented to person and place and not time. Equal bilateral weakness in lower legs. Equal grip strength. RRR. CTAB. PERRLA. EOMI. Cranial nerves II through XII intact. AMS workup with CT ordered.   History obtained from prior chart review, the patient, and the patient's husband.  On prior chart review, that patient was seen here and admitted on 09/18/21 for the same symptoms and AMS. They thought it was likely polypharmacy as her AMS improved once in the hospital. Normal  MRI.   I independently reviewed and interpreted the patient's labs and imaging. CMP shows mild hypocalcemia, otherwise no  electrolyte abnormality. CBC shows anemia, although slightly lower than baseline. Troponins flat at 6. Urinalysis shows positive UTI with nitrites and leukocytes. I agree with the radiologist's interpretation of the CT head reading of no acute intracranial abnormality, just mild brain atrophy. CXR normal. DG lumbar shows no acute process, but osteopenia and degenerative disc disease.  Positive for UTI although I don't think this is the only contributing factor to the AMS. Will admit for AMS workup and possible psych consult. Dr. Orma Flaming to admit.    Final Clinical Impression(s) / ED Diagnoses Final diagnoses:  Encephalopathy acute  Low back pain    Rx / DC Orders ED Discharge Orders     None         Sherrell Puller, Hershal Coria 10/25/21 2144    Godfrey Pick, MD 10/26/21 1055

## 2021-10-24 NOTE — ED Triage Notes (Signed)
EMS reports from home, c/o generalized weakness in bilateral legs, Pt states not eating or drinking much. Husband states Pt had two slip and falls today, no noted injury and Pt denies new pain, LOC or head strike.Marland Kitchen Hx of frequent falls.  BP 120/66 HR 90 RR 20 Sp02 97 RA CBG 122

## 2021-10-24 NOTE — H&P (Signed)
History and Physical    TOMORROW DEHAAS CZY:[id number] DOB: 06/03/1961 DOA: 10/24/2021  PCP: Fanny Bien, MD Consultants:  cardiology: Dr. Percival Spanish, neurology: Dr. Jannifer Franklin  Patient coming from:  Home - lives with husband and her mother   Chief Complaint: bilateral lower leg weakness/hallucinations  HPI: ANNSLEIGH DRAGOO is a 61 y.o. female with medical history significant of anxiety, depression,, PAF on eliquis, IDA who presented to ED due to weakness and visual hallucinations. She slipped and slid to floor and was unable to get off the floor. She denies hitting her head and was only on the ground for a few minutes. No seizure like activity, no urinary incontinence.  She states her legs were so weak she couldn't get up which is not normal for her. She denies any facial drooping or slurred speech. No upper extremity weakness. She denies any numbness or tingling. No urinary incontinence and no saddles paraesthesias. She does have chronic lower back pain.   She also admits to visual hallucinations for a couple days. She sees people that aren't there and talks to them. They do not say anything scary. She does not hear any voices. She denies any SI/HI.   Recently admitted for acute encephalopathy on 09/16/21. Neurology consulted with EEG negative, MRI brain unremarkable. Concern for polypharmacy which resolved after holding home medication.   She denies any drugs/alcohol/smoking. She denies any recent illness/sick contact.  No fever/chills, no headaches, no vision changes. No chest pain or palpitations, no shortness of breath or coughing. No abdominal pain, no N/V/D, no dysuria and no leg swelling.   Talking to husband her speech is abnormal. Typically she has good speech with good pronunciation. He is unsure what happened as she was upstairs. Called around 6:40 the first time this AM and was on the floor. They put her in the bed and she refused to come to the hospital. Then at 10:00 he called again  because she was crying and needing help because she couldn't get up. She also defecated on her bed which is new for her. Has had a real problem with time and days of week. She got up at 4am and thought it was 6:00 in the afternoon and started to cook dinner. Overall increased confusion. He recently had an amputation and is bound to the downstairs, he wonders if the stress of this is causing her more anxiety and depression. It's also been a stressful week with lots of doctors appointments for him.   ED Course: vitals: afebrile, bp: 135/72, HR: 82, RR: 18, oxygen: 98% RA Pertinent labs: hemoglobin 9.0 (9-10) CTH: no acute finding In ED:  [facility] was asked to admit. UA pending, I ordered more of a work up.   Review of Systems: As per HPI; otherwise review of systems reviewed and negative.   Ambulatory Status:  Ambulates without assistance   Past Medical History:  Diagnosis Date   Anxiety    Arthritis    neck and lower back - deg disc disease, fingers   Depression    Frequency of urination    GERD (gastroesophageal reflux disease)    History of seizure per pt no seizure since    11-07-2015  in setting of stress and sleep deprivation  /  negative work-up by neurologist unknown idiology   Hypothyroidism    Injury of thumb, left    03-31-2016     Pelvic relaxation    SUI (stress urinary incontinence, female)     Past Surgical History:  Procedure Laterality Date   ANTERIOR AND POSTERIOR REPAIR  09/25/2012   Procedure: ANTERIOR (CYSTOCELE) AND POSTERIOR REPAIR (RECTOCELE);  Surgeon: Lahoma Crocker, MD;  Location: [facility];  Service: Gynecology;  Laterality: N/A;   ANTERIOR AND POSTERIOR REPAIR WITH SACROSPINOUS FIXATION N/A 04/12/2016   Procedure: ANTERIOR AND POSTERIOR REPAIR WITH SACROSPINOUS LIGAMENT SUSPENSION, CYSTOSCOPY;  Surgeon: Arvella Nigh, MD;  Location: [facility];  Service: Gynecology;  Laterality: N/A;   APPENDECTOMY  1987   BREAST EXCISIONAL BIOPSY Left    No  scar visable    BREAST REDUCTION SURGERY  2000  aprrox   ENDOMETRIAL ABLATION W/ NOVASURE  2011   in office   EXCISION LEFT BREAST BX  12-07-1999   benign   LAPAROSCOPIC CHOLECYSTECTOMY  02-09-2005   LAPAROSCOPIC VAGINAL HYSTERECTOMY WITH SALPINGO OOPHORECTOMY Bilateral 04/12/2016   Procedure: LAPAROSCOPIC ASSISTED VAGINAL HYSTERECTOMY WITH SALPINGO OOPHORECTOMY;  Surgeon: Arvella Nigh, MD;  Location: [facility];  Service: Gynecology;  Laterality: Bilateral;   ORIF RADIAL FRACTURE Left 02/09/2021   Procedure: ORIF LEFT DISTAL RADIUS FRACTURE;  Surgeon: Cindra Presume, MD;  Location: [facility];  Service: Plastics;  Laterality: Left;  90 min   REDUCTION MAMMAPLASTY Bilateral    ROUX-EN-Y GASTRIC BYPASS  06/  2002   TUBAL LIGATION  10/ 1990    Social History   Socioeconomic History   Marital status: Married    Spouse name: Kelsey Durflinger   Number of children: 3   Years of education: 16   Highest education level: Not on file  Occupational History   Occupation: [employer]  Tobacco Use   Smoking status: Never   Smokeless tobacco: Never  Vaping Use   Vaping Use: Never used  Substance and Sexual Activity   Alcohol use: No    Comment: drinks daily - none since 08/2012 per pt.   Drug use: No   Sexual activity: Yes    Birth control/protection: Surgical  Other Topics Concern   Not on file  Social History Narrative   Lives with spouse   Caffeine use: 2-3 cups per day   Social Determinants of Health   Financial Resource Strain: Not on file  Food Insecurity: Not on file  Transportation Needs: Not on file  Physical Activity: Not on file  Stress: Not on file  Social Connections: Not on file  Intimate Partner Violence: Not on file    Allergies  Allergen Reactions   Nsaids Other (See Comments)    Post Gastric Bypass   Adhesive [Tape] Itching and Rash   Latex Itching, Rash and Other (See Comments)   Sulfa Antibiotics Itching and Rash    Family History  Problem  Relation Age of Onset   Colon polyps Mother    Heart disease Father 67       CABG   Breast cancer Maternal Aunt    Stomach cancer Maternal Grandmother    Diabetes Maternal Grandmother    Heart disease Maternal Grandmother    Stomach cancer Maternal Uncle    Colon cancer Maternal Uncle        x3   Diabetes Maternal Aunt        Type I   Diabetes Maternal Uncle        x5 - Type II   Heart disease Paternal Grandmother    Esophageal cancer Neg Hx    Gallbladder disease Neg Hx    Seizures Neg Hx     Prior to Admission medications   Medication Sig Start  Date End Date Taking? Authorizing Provider  buPROPion (WELLBUTRIN XL) 150 MG 24 hr tablet Take 450 mg by mouth daily. 12/17/19   [provider]  celecoxib (CELEBREX) 200 MG capsule Take 1 capsule (200 mg total) by mouth 2 (two) times daily. 03/19/21   Cindra Presume, MD  clonazePAM (KLONOPIN) 1 MG tablet Take 1 mg by mouth 3 (three) times daily as needed for anxiety.  03/24/20   [provider]  clotrimazole (LOTRIMIN) 1 % cream Apply to affected area 2 times daily Patient not taking: Reported on 10/13/2021 07/29/21   Couture, Cortni S, PA-C  ELIQUIS 5 MG TABS tablet TAKE ONE TABLET BY MOUTH TWICE A DAY 08/31/21   Minus Breeding, MD  FLUoxetine (PROZAC) 40 MG capsule Take 40 mg by mouth daily. 06/23/21   [provider]  gabapentin (NEURONTIN) 600 MG tablet Take 1,200 mg by mouth 3 (three) times daily.    [provider]  KLOR-CON M20 20 MEQ tablet TAKE ONE TABLET BY MOUTH DAILY 08/06/21   Minus Breeding, MD  lidocaine (LIDODERM) 5 % Place 1 patch onto the skin daily for 15 days. Remove & Discard patch within 12 hours or as directed by MD 10/13/21 10/28/21  Sherre Poot, Adora Fridge, PA-C  Melatonin 10 MG TABS Take 10 mg by mouth at bedtime as needed (sleep).    [provider]  metoprolol succinate (TOPROL-XL) 25 MG 24 hr tablet Take 1 tablet (25 mg total) by mouth at bedtime. 03/27/21   Minus Breeding, MD   OLANZapine (ZYPREXA) 10 MG tablet Take 10 mg by mouth at bedtime. 04/04/19   [provider]  ondansetron (ZOFRAN) 4 MG tablet Take 1 tablet (4 mg total) by mouth every 8 (eight) hours as needed for nausea or vomiting. Patient not taking: Reported on 09/17/2021 02/09/21   Scheeler, Carola Rhine, PA-C    Physical Exam: Vitals:   10/24/21 1430 10/24/21 1515 10/24/21 1600 10/24/21 1801  BP: (!) 148/68 (!) 174/110 (!) 168/78 128/76  Pulse: 80 81 83 80  Resp: 20 (!) 23 15 16   Temp:    100 F (37.8 C)  TempSrc:    Oral  SpO2: 100% 99% 99% 100%  Weight:    121.9 kg  Height:    5\' 5"  (1.651 m)     General:  Appears calm and comfortable and is in NAD.  Eyes:  PERRL, EOMI, normal lids, iris ENT:  grossly normal hearing, lips & tongue, mmm; appropriate dentition Neck:  no LAD, masses or thyromegaly; no carotid bruits Cardiovascular:  RRR, no m/r/g. No LE edema.  Respiratory:   CTA bilaterally with no wheezes/rales/rhonchi.  Normal respiratory effort. Abdomen:  soft, NT, ND, NABS Back:   normal alignment, no CVAT Skin:  no rash or induration seen on limited exam. Mud or feces in between right toes Musculoskeletal:  grossly normal tone BUE/BLE, good ROM, no bony abnormality Lower extremity:  No LE edema.  Limited foot exam with no ulcerations.  2+ distal pulses. Psychiatric:  grossly normal mood and affect, speech pressured, but appropriate, Aox2. Knows month and year, thought day was the 12th. Denies any SI/HI. +VH, no AH Neurologic:  CN 2-12 grossly intact, moves all extremities in coordinated fashion, sensation intact   Radiological Exams on Admission: Independently reviewed - see discussion in A/P where applicable  DG Lumbar Spine 2-3 Views  Result Date: 10/24/2021 CLINICAL DATA:  Generalized weakness in legs.  Low back pain. EXAM: LUMBAR SPINE - 2-3 VIEW COMPARISON:  09/16/2021 FINDINGS: Diffuse degenerative disc disease and facet disease. Normal alignment. Diffuse osteopenia. No  fracture. SI joints symmetric and unremarkable. IMPRESSION: Osteopenia. Degenerative disc and facet disease. No acute bony abnormality. Electronically Signed   By: Rolm Baptise M.D.   On: 10/24/2021 17:04   CT Head Wo Contrast  Result Date: 10/24/2021 CLINICAL DATA:  Head trauma.  Moderate to severe. EXAM: CT HEAD WITHOUT CONTRAST TECHNIQUE: Contiguous axial images were obtained from the base of the skull through the vertex without intravenous contrast. RADIATION DOSE REDUCTION: This exam was performed according to the departmental dose-optimization program which includes automated exposure control, adjustment of the mA and/or kV according to patient size and/or use of iterative reconstruction technique. COMPARISON:  10/13/2021 FINDINGS: Brain: No evidence of acute infarction, hemorrhage, hydrocephalus, extra-axial collection or mass lesion/mass effect.Prominence of the CSF spaces overlying the frontoparietal lobes identified suggestive of brain atrophy. Vascular: No hyperdense vessel or unexpected calcification. Skull: Normal. Negative for fracture or focal lesion. Sinuses/Orbits: Paranasal sinuses and mastoid air cells are clear Other: None IMPRESSION: 1. No acute intracranial abnormalities. 2. Mild brain atrophy. Electronically Signed   By: Kerby Moors M.D.   On: 10/24/2021 12:40   DG CHEST PORT 1 VIEW  Result Date: 10/24/2021 CLINICAL DATA:  Generalized weakness EXAM: PORTABLE CHEST 1 VIEW COMPARISON:  09/16/2021 FINDINGS: Heart is upper limits normal in size. No confluent airspace opacities, effusions or edema. No acute bony abnormality. IMPRESSION: No active disease. Electronically Signed   By: Rolm Baptise M.D.   On: 10/24/2021 17:03    EKG: Independently reviewed.  NSR with rate 82; nonspecific ST changes with no evidence of acute ischemia   Labs on Admission: I have personally reviewed the available labs and imaging studies at the time of the admission.  Pertinent labs:  hemoglobin 9.0  (9-10) Covid/flu negative Ammonia wnl Troponin wnl x 2 UA pending    Assessment/Plan * Encephalopathy acute- (present on admission) 61 year old presenting with bilateral LE weakness/hallucinations and worsening confusion per her husband. Recently admitted in 12/22 for simliar presentation with neurological w/u with EEG/MRI that were normal, uneventful w/u and ? Polypharmacy.  -place in obs on telemetry -she has mentally cleared up and is able to answer questions correctly, follow directions and is alert and oriented except for date. Knows year/month. Speech is still pressured and fast.  -she has had no fever/no white count and no suspicion for infection. UA pending. CXR clear.  -CTH with no acute finding. Recent MRI 4 weeks ago with no acute finding and no neurological deficit on exam.  -metabolic labs wnl, check W25, UDS and CK with fall  -? If polypharmacy, stress and anxiety are contributing vs. Mood disorder. With her new hallucinations/pressured speech have consulted psychiatry. Holding home meds: zyprexa, wellbutrin, prozac. Will continue klonopin to prevent withdrawal and she is much more alert and oriented. -fall and delirium precautions    Weakness History of weakness and recurrent falls Check lumbar xray, no radicular signs at this time to warrant MRI PT to eval/treat Fall precautions   Hallucinations- (present on admission) Under a lot of stress and has had increased confusion and new visual hallucinations with pressured and fast speech Psychiatry consulted Hold meds at this time   AF (paroxysmal atrial fibrillation) ([place])- (present on admission) NSR. Last took her eliquis last night CHA2Ds2-Vasc of 2, continue eliquis  She is alert and oriented enough to eat/drink Continue toprol  Telemetry   Depression- (present on admission) Hold meds for now,  as polypharmacy could be contributing to her episodes of confusion  psychiatry has been consulted   Generalized anxiety  disorder- (present on admission) Very stressed and anxious per husband.  Unsure if has taken more klonopin than px and on high dose of wellbutrin +prozac +zyprexa. Fills correctly on pmp website  Hold medication for now except for her klonopin as to prevent withdrawal, will decrease down to BID prn  Psychiatry consulted   Iron deficiency anemia- (present on admission) At baseline (9-10) Continue to monitor   OSA (obstructive sleep apnea) Untreated. Has tried multiple masks and unable to wear.      Body mass index is 44.72 kg/m.    Level of care: Telemetry DVT prophylaxis: eliquis/TED hose Code Status:  Full - confirmed with family Family Communication: None present; I spoke with the patient's husband by telephone at the time of admission. Oral Hallgren: [phone] Disposition Plan:  The patient is from: home  Anticipated d/c is to: per day team   Patient placed in observation as anticipate less than 2 midnight stay. Requires hospitalization for hallucinations/weakness, evaluation by specialists, frequent assessment and monitoring as it is not safe for her to return home at this time.     Patient is currently: stable Consults called: PT/psychiatry  Admission status:  observation    Orma Flaming MD [facility]   How to contact the [facility] Attending or Consulting provider [place] or covering provider during after hours [place], for this patient?  Check the care team in [facility] and look for a) attending/consulting [facility] provider listed and b) the [facility] team listed Log into [email or web address] and use [facility]'s universal password to access. If you do not have the password, please contact the hospital operator. Locate the [facility] provider you are looking for under [facility] and page to a number that you can be directly reached. If you still have difficulty reaching the provider, please page the [facility] (Director on Call) for the Hospitalists listed on amion for  assistance.   10/24/2021, 6:19 PM

## 2021-10-25 DIAGNOSIS — G9340 Encephalopathy, unspecified: Secondary | ICD-10-CM | POA: Diagnosis not present

## 2021-10-25 LAB — CBC
HCT: 29.2 % — ABNORMAL LOW (ref 36.0–46.0)
Hemoglobin: 8.4 g/dL — ABNORMAL LOW (ref 12.0–15.0)
MCH: 21.8 pg — ABNORMAL LOW (ref 26.0–34.0)
MCHC: 28.8 g/dL — ABNORMAL LOW (ref 30.0–36.0)
MCV: 75.6 fL — ABNORMAL LOW (ref 80.0–100.0)
Platelets: 245 10*3/uL (ref 150–400)
RBC: 3.86 MIL/uL — ABNORMAL LOW (ref 3.87–5.11)
RDW: 18.5 % — ABNORMAL HIGH (ref 11.5–15.5)
WBC: 5 10*3/uL (ref 4.0–10.5)
nRBC: 0 % (ref 0.0–0.2)

## 2021-10-25 LAB — BASIC METABOLIC PANEL
Anion gap: 10 (ref 5–15)
BUN: 10 mg/dL (ref 6–20)
CO2: 23 mmol/L (ref 22–32)
Calcium: 8.6 mg/dL — ABNORMAL LOW (ref 8.9–10.3)
Chloride: 106 mmol/L (ref 98–111)
Creatinine, Ser: 0.77 mg/dL (ref 0.44–1.00)
GFR, Estimated: >60 mL/min (ref >60)
Glucose, Bld: 101 mg/dL — ABNORMAL HIGH (ref 70–99)
Potassium: 3.5 mmol/L (ref 3.5–5.1)
Sodium: 139 mmol/L (ref 135–145)

## 2021-10-25 LAB — RAPID URINE DRUG SCREEN, HOSP PERFORMED
Amphetamines: NOT DETECTED
Barbiturates: NOT DETECTED
Benzodiazepines: POSITIVE — AB
Cocaine: NOT DETECTED
Opiates: POSITIVE — AB
Tetrahydrocannabinol: NOT DETECTED

## 2021-10-25 MED ORDER — BUPROPION HCL ER (XL) 150 MG PO TB24: 300.0000 mg | ORAL_TABLET | Freq: Every day | ORAL | Status: DC

## 2021-10-25 MED ORDER — GABAPENTIN 600 MG PO TABS: 600.0000 mg | ORAL_TABLET | Freq: Two times a day (BID) | ORAL | Status: DC

## 2021-10-25 MED ORDER — CLONAZEPAM 1 MG PO TABS: 1.0000 mg | ORAL_TABLET | Freq: Two times a day (BID) | ORAL | 0 refills | Status: AC
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
## 2021-10-25 NOTE — Progress Notes (Signed)
Patient given discharge, medication, and follow up instructions, verbalized understanding, IV and telemetry monitor removed, personal belongings with patient, family to transport home

## 2021-10-25 NOTE — Plan of Care (Signed)
°  Problem: Education: Goal: Understanding of medication regimen will improve Outcome: Not Progressing   Problem: Health Behavior/Discharge Planning: Goal: Ability to safely manage health-related needs after discharge will improve Outcome: Not Progressing

## 2021-10-25 NOTE — Consult Note (Signed)
[facility] New Face-to-FacePsychiatric Evaluation   Service Date: October 25, 2021 LOS:  LOS: 0 days    Assessment  Tammy [name] is a 61 y.o. female admitted medically for 10/24/2021 10:55 AM for encephalopathy. She carries the psychiatric diagnoses of depression, anxiety, and alcohol use disorder and has a past medical history IDA, degenerative disc disease, OSA.Psychiatry was consulted for altered mental status by Verneita Griffes, MD.    Her current presentation of altered mental status which quickly resolved upon admission to the hospital without treatment of any underlying cause is most consistent with polypharmacy and substance abuse.  Current outpatient psychotropic medications include prozac, wellbutrin and olanzapine (to approximate symbiax) and historically she has had a fair response to these medications. She was compliant with medications prior to admission however admits to using someone else's opioid prescription - last 7 UDS (+) opioids with no active prescriptoin. UDS intermittently also + for BZD which is not often seen with clonazepam although she denies illicit use. On initial examination, patient was pleasant, forthcoming, and future oriented. Please see plan below for detailed recommendations. We are reducing klonopin and wellbutrin, had a long discussion about mixing klonopin with opioids and other substances. I have recommended she taper this as an outpt, and also discussed transitioning from SSRI + NDRI to SNRI for improved tx of neuropathic pain as an outpt.   Diagnoses:  Active Hospital problems: Principal Problem:   Encephalopathy acute Active Problems:   Depression   Generalized anxiety disorder   Iron deficiency anemia   Hallucinations   Weakness   AF (paroxysmal atrial fibrillation) (HCC)   OSA (obstructive sleep apnea)    Problems edited/added by me: No problems updated.  Plan  ## Safety and Observation Level:  - Based on my clinical  evaluation, I estimate the patient to be at low risk of self harm in the current setting - At this time, we recommend a routine level of observation. This decision is based on my review of the chart including patient's history and current presentation, interview of the patient, mental status examination, and consideration of suicide risk including evaluating suicidal ideation, plan, intent, suicidal or self-harm behaviors, risk factors, and protective factors. This judgment is based on our ability to directly address suicide risk, implement suicide prevention strategies and develop a safety plan while the patient is in the clinical setting. Please contact our team if there is a concern that risk level has changed.   ## Medications:  -- continue home olanzapine 10, fluoxetine 40 -- reduce home wellbutrin to 300 mg (no improvement from 300-->400 per pt) -- reduce klonopin to 1 mg BID PRN; pt to   ## Medical Decision Making Capacity:  Not formally assessed  ## Further Work-up:  -- none, pt discharging   -- most recent EKG on 1/22 had QtC of 455 -- Pertinent labwork reviewed earlier this admission includes: UDS, low B12 (does not explain sx or rapid resolution)  ## Disposition:  -- home Thank you for this consult request. Recommendations have been communicated to the primary team.  We will sign off at this time.   Torboy A Tammy [name]   NEW history  Relevant Aspects of Hospital Course:  Admitted on 10/24/2021 for altered mental status.  Patient Report:  Pt interviewed in presence of son Tammy [name]; consents to have him stay in the room. She is alert, oriented, and able to do MOYB with no errors. She is forthcoming about using someone else's prescription of pain killers.  She also will take up to 2 klonopin at a time when she is feeling more anxious. She has been on a fairly stable psych regimen for some time but klonopin was added a year ago after she switched from Dr. Toy [name] to her PA. She has a  history notable for AUD with multiple DUIs and at least one trip to rehab. Had been sober for 5 years with 2 relapses within last year.  Had long discussion with pt and son on risk of klonopin in pts with EtOH use disorder and the interaction between BZD and opioids. Pt shares all of her hospitalizations for AMS have been after klonopin started. Discussed risk of stopping klonopin cold [place]. Recommended they keep narcan in the home.   She has some hypomanic episodes - awake for up to 2 days at at a time, making impulsive decision, so wired at the end of the 2 days that she "drank herself to sleep"; psychiatrist started olanzapine to approximate Symbyax without formally diagnosing with bipolar disorder.   ROS:  Feeling much better, some chronic pain in back/neck.   Collateral information:  Son states that pt frequently slurs words and has poor memory at baseline. Today is the best she has looked in the last several months.   Psychiatric History:  Information collected from pt, son Diagnosed with depression and anxiety Sees Tammy [name] (PA-C), prev saw Dr. Toy [name] On wellbutrin 450, zyprexa 10, prozac 40 for many years, klonopin added 1 year ago. No improvement when wellbutrin 300-->400.  No hx self harm or violence. No hx psych hospitalizations  Family psych history: mother with anxiety   Social History:  Lives with husband  Tobacco use: no Alcohol use: 2 relapses within last year most recently Christmas Drug use: using someone else's opioid prescription.   Hx 3 DWIs, has been to rehab.   Family History:  The patient's family history includes Breast cancer in her maternal aunt; Colon cancer in her maternal uncle; Colon polyps in her mother; Diabetes in her maternal aunt, maternal grandmother, and maternal uncle; Heart disease in her maternal grandmother and paternal grandmother; Heart disease (age of onset: 29) in her father; Stomach cancer in her maternal grandmother and maternal  uncle.  Medical History: Past Medical History:  Diagnosis Date   Anxiety    Arthritis    neck and lower back - deg disc disease, fingers   Depression    Frequency of urination    GERD (gastroesophageal reflux disease)    History of seizure per pt no seizure since    11-07-2015  in setting of stress and sleep deprivation  /  negative work-up by neurologist unknown idiology   Hypothyroidism    Injury of thumb, left    03-31-2016     Pelvic relaxation    SUI (stress urinary incontinence, female)     Surgical History: Past Surgical History:  Procedure Laterality Date   ANTERIOR AND POSTERIOR REPAIR  09/25/2012   Procedure: ANTERIOR (CYSTOCELE) AND POSTERIOR REPAIR (RECTOCELE);  Surgeon: Lahoma Crocker, MD;  Location: [facility];  Service: Gynecology;  Laterality: N/A;   ANTERIOR AND POSTERIOR REPAIR WITH SACROSPINOUS FIXATION N/A 04/12/2016   Procedure: ANTERIOR AND POSTERIOR REPAIR WITH SACROSPINOUS LIGAMENT SUSPENSION, CYSTOSCOPY;  Surgeon: Arvella Nigh, MD;  Location: [facility];  Service: Gynecology;  Laterality: N/A;   APPENDECTOMY  1987   BREAST EXCISIONAL BIOPSY Left    No scar visable    BREAST REDUCTION SURGERY  2000  aprrox   ENDOMETRIAL  ABLATION W/ NOVASURE  2011   in office   EXCISION LEFT BREAST BX  12-07-1999   benign   LAPAROSCOPIC CHOLECYSTECTOMY  02-09-2005   LAPAROSCOPIC VAGINAL HYSTERECTOMY WITH SALPINGO OOPHORECTOMY Bilateral 04/12/2016   Procedure: LAPAROSCOPIC ASSISTED VAGINAL HYSTERECTOMY WITH SALPINGO OOPHORECTOMY;  Surgeon: Arvella Nigh, MD;  Location: [facility];  Service: Gynecology;  Laterality: Bilateral;   ORIF RADIAL FRACTURE Left 02/09/2021   Procedure: ORIF LEFT DISTAL RADIUS FRACTURE;  Surgeon: Cindra Presume, MD;  Location: [facility];  Service: Plastics;  Laterality: Left;  90 min   REDUCTION MAMMAPLASTY Bilateral    ROUX-EN-Y GASTRIC BYPASS  06/  2002   TUBAL LIGATION  10/ 1990    Medications:   Current  Facility-Administered Medications:    acetaminophen (TYLENOL) tablet 650 mg, 650 mg, Oral, Q6H PRN, 650 mg at 10/25/21 1240 **OR** acetaminophen (TYLENOL) suppository 650 mg, 650 mg, Rectal, Q6H PRN, Orma Flaming, MD   apixaban Arne Cleveland) tablet 5 mg, 5 mg, Oral, BID, Orma Flaming, MD, 5 mg at 10/25/21 0800   clonazePAM (KLONOPIN) tablet 1 mg, 1 mg, Oral, BID PRN, Orma Flaming, MD, 1 mg at 10/25/21 0756   metoprolol succinate (TOPROL-XL) 24 hr tablet 25 mg, 25 mg, Oral, QHS, Orma Flaming, MD, 25 mg at 10/24/21 2034  Allergies: Allergies  Allergen Reactions   Nsaids Other (See Comments)    Post Gastric Bypass   Adhesive [Tape] Itching and Rash   Latex Itching, Rash and Other (See Comments)   Sulfa Antibiotics Itching and Rash       Objective  Vital signs:  Temp:  [97.4 F (36.3 C)-98.6 F (37 C)] 97.4 F (36.3 C) (01/22 1434) Pulse Rate:  [73-81] 76 (01/22 1434) Resp:  [18] 18 (01/22 1434) BP: (103-121)/(55-70) 121/70 (01/22 1434) SpO2:  [94 %-96 %] 94 % (01/22 1434)  Psychiatric Specialty Exam:  Presentation  General Appearance: Appropriate for Environment Eye Contact:Good Speech:Clear and Coherent; Normal Rate Speech Volume:Normal Handedness:No data recorded  Mood and Affect  Mood:-- ("A lot better") Affect:Congruent  Thought Process  Thought Processes:Coherent; Goal Directed; Linear Descriptions of Associations:Intact  Orientation:Full (Time, Place and Person)  Thought Content:-- (devoid of SI, HI, delusions or paranoia)  History of Schizophrenia/Schizoaffective disorder:No data recorded Duration of Psychotic Symptoms:No data recorded Hallucinations:Hallucinations: None  Ideas of Reference:None  Suicidal Thoughts:Suicidal Thoughts: No  Homicidal Thoughts:Homicidal Thoughts: No   Sensorium  Memory:Immediate Good; Remote Good; Recent Poor Judgment:Poor Insight:Fair  Executive Functions  Concentration:Fair Attention  Span:Fair [place]  Psychomotor Activity  Psychomotor Activity:Psychomotor Activity: Normal  Assets  Assets:Communication Skills; Intimacy; Social Support  Sleep  Sleep:Sleep: Good   Physical Exam: Physical Exam HENT:     Head: Normocephalic.  Pulmonary:     Effort: Pulmonary effort is normal.  Neurological:     Mental Status: She is alert and oriented to person, place, and time.   ROS Did not endorse any physical sx.  Blood pressure 121/70, pulse 76, temperature (!) 97.4 F (36.3 C), temperature source Oral, resp. rate 18, height 5\' 5"  (1.651 m), weight 121.9 kg, last menstrual period 09/30/2012, SpO2 94 %. Body mass index is 44.72 kg/m.

## 2021-10-25 NOTE — TOC CM/SW Note (Signed)
°  Transition of Care [facility]) Screening Note   Patient Details  Name: CHLOE MIYOSHI Date of Birth: 25-Jan-1961   Transition of Care [facility]) CM/SW Contact:    Ross Ludwig, LCSW Phone Number: 10/25/2021, 6:12 PM    Transition of Care Department [facility]) has reviewed patient and no TOC needs have been identified at this time. We will continue to monitor patient advancement through interdisciplinary progression rounds. If new patient transition needs arise, please place a TOC consult.

## 2021-10-25 NOTE — Plan of Care (Signed)
°  Problem: Education: Goal: Knowledge of disease or condition will improve Outcome: Adequate for Discharge Goal: Understanding of medication regimen will improve 10/25/2021 1810 by Saunders Glance, RN Outcome: Adequate for Discharge 10/25/2021 1142 by Saunders Glance, RN Outcome: Not Progressing Goal: Individualized Educational Video(s) Outcome: Adequate for Discharge   Problem: Activity: Goal: Ability to tolerate increased activity will improve Outcome: Adequate for Discharge   Problem: Cardiac: Goal: Ability to achieve and maintain adequate cardiopulmonary perfusion will improve Outcome: Adequate for Discharge   Problem: Health Behavior/Discharge Planning: Goal: Ability to safely manage health-related needs after discharge will improve 10/25/2021 1810 by Saunders Glance, RN Outcome: Adequate for Discharge 10/25/2021 1142 by Saunders Glance, RN Outcome: Not Progressing   Problem: Education: Goal: Knowledge of General Education information will improve Description: Including pain rating scale, medication(s)/side effects and non-pharmacologic comfort measures Outcome: Adequate for Discharge   Problem: Health Behavior/Discharge Planning: Goal: Ability to manage health-related needs will improve Outcome: Adequate for Discharge   Problem: Clinical Measurements: Goal: Ability to maintain clinical measurements within normal limits will improve Outcome: Adequate for Discharge Goal: Will remain free from infection Outcome: Adequate for Discharge Goal: Diagnostic test results will improve Outcome: Adequate for Discharge Goal: Respiratory complications will improve Outcome: Adequate for Discharge Goal: Cardiovascular complication will be avoided Outcome: Adequate for Discharge   Problem: Activity: Goal: Risk for activity intolerance will decrease Outcome: Adequate for Discharge   Problem: Nutrition: Goal: Adequate nutrition will be maintained Outcome: Adequate for  Discharge   Problem: Coping: Goal: Level of anxiety will decrease Outcome: Adequate for Discharge   Problem: Elimination: Goal: Will not experience complications related to bowel motility Outcome: Adequate for Discharge Goal: Will not experience complications related to urinary retention Outcome: Adequate for Discharge   Problem: Pain Managment: Goal: General experience of comfort will improve Outcome: Adequate for Discharge   Problem: Safety: Goal: Ability to remain free from injury will improve Outcome: Adequate for Discharge   Problem: Skin Integrity: Goal: Risk for impaired skin integrity will decrease Outcome: Adequate for Discharge

## 2021-10-25 NOTE — Evaluation (Signed)
Physical Therapy Evaluation Patient Details Name: Tammy [name] MRN: [id number] DOB: 11/11/1960 Today's Date: 10/25/2021  History of Present Illness  61 y.o. female with medical history significant of anxiety, depression, PAF on eliquis, IDA who presented to ED due to weakness and visual hallucinations. She slipped and slid to floor and was unable to get off the floor. She denies hitting her head. She also admits to visual hallucinations for a couple days. She sees people that aren't there and talks to them. They do not say anything scary. She does not hear any voices. She denies any SI/HI.  psych consult pending  Clinical Impression  Patient evaluated by Physical Therapy with no further acute PT needs identified. All education has been completed and the patient has no further questions.  Pt doing well this pm. Feels she is back at baseline and is anxious to d/c home   See below for any follow-up Physical Therapy or equipment needs. PT is signing off. Thank you for this referral.        Recommendations for follow up therapy are one component of a multi-disciplinary discharge planning process, led by the attending physician.  Recommendations may be updated based on patient status, additional functional criteria and insurance authorization.  Follow Up Recommendations No PT follow up    Assistance Recommended at Discharge PRN  Patient can return home with the following       Equipment Recommendations None recommended by PT  Recommendations for Other Services       Functional Status Assessment Patient has not had a recent decline in their functional status     Precautions / Restrictions Precautions Precautions: None Restrictions Weight Bearing Restrictions: No      Mobility  Bed Mobility               General bed mobility comments: in recliner    Transfers Overall transfer level: Needs assistance Equipment used: None Transfers: Sit to/from Stand Sit to Stand:  Modified independent (Device/Increase time)                Ambulation/Gait Ambulation/Gait assistance: Modified independent (Device/Increase time), Independent Gait Distance (Feet): 160 Feet Assistive device: None Gait Pattern/deviations: Step-through pattern       General Gait Details: steady gait, no LOB, no driffting or LOB with higher level activities  Stairs            Wheelchair Mobility    Modified Rankin (Stroke Patients Only)       Balance Overall balance assessment: Needs assistance                           High level balance activites: Backward walking, Direction changes, Turns, Sudden stops, Head turns High Level Balance Comments: no LOB             Pertinent Vitals/Pain Pain Assessment Pain Assessment: No/denies pain    Home Living Family/patient expects to be discharged to:: Private residence Living Arrangements: Spouse/significant other Available Help at Discharge: Family Type of Home: House Home Access: Stairs to enter   Technical brewer of Steps: 1 Alternate Level Stairs-Number of Steps: 13 Home Layout: Bed/bath upstairs;Two level Home Equipment: Conservation officer, nature (2 wheels)      Prior Function Prior Level of Function : Independent/Modified Independent             Mobility Comments: pt denies falls prior to most recent one; denies mobility issues, going up and down stairs without difficulty  Hand Dominance        Extremity/Trunk Assessment   Upper Extremity Assessment Upper Extremity Assessment: Overall WFL for tasks assessed    Lower Extremity Assessment Lower Extremity Assessment: Overall WFL for tasks assessed       Communication   Communication: No difficulties  Cognition Arousal/Alertness: Awake/alert Behavior During Therapy: WFL for tasks assessed/performed Overall Cognitive Status: Within Functional Limits for tasks assessed                                           General Comments      Exercises     Assessment/Plan    PT Assessment Patient does not need any further PT services  PT Problem List         PT Treatment Interventions      PT Goals (Current goals can be found in the Care Plan section)  Acute Rehab PT Goals Patient Stated Goal: home asap PT Goal Formulation: All assessment and education complete, DC therapy    Frequency       Co-evaluation               AM-PAC PT "6 Clicks" Mobility  Outcome Measure Help needed turning from your back to your side while in a flat bed without using bedrails?: None Help needed moving from lying on your back to sitting on the side of a flat bed without using bedrails?: None Help needed moving to and from a bed to a chair (including a wheelchair)?: None Help needed standing up from a chair using your arms (e.g., wheelchair or bedside chair)?: None Help needed to walk in hospital room?: None Help needed climbing 3-5 steps with a railing? : None 6 Click Score: 24    End of Session   Activity Tolerance: Patient tolerated treatment well Patient left: with call bell/phone within reach;with chair alarm set;with family/visitor present Nurse Communication: Mobility status PT Visit Diagnosis: Other abnormalities of gait and mobility (R26.89)    Time: 8144-8185 PT Time Calculation (min) (ACUTE ONLY): 13 min   Charges:   PT Evaluation $PT Eval Low Complexity: [place], PT  Acute Rehab Dept ([place]) [phone] Pager [phone]  10/25/2021   [facility] 10/25/2021, 5:16 PM

## 2021-10-27 LAB — URINE CULTURE: Culture: >=100000 — AB

## 2021-11-04 ENCOUNTER — Other Ambulatory Visit: Payer: Self-pay

## 2021-11-04 ENCOUNTER — Ambulatory Visit (INDEPENDENT_AMBULATORY_CARE_PROVIDER_SITE_OTHER): Payer: 59 | Admitting: Family Medicine

## 2021-11-04 ENCOUNTER — Encounter: Payer: Self-pay | Admitting: Family Medicine

## 2021-11-04 VITALS — BP 126/78 | HR 78 | Temp 97.6°F | Ht 66.0 in | Wt 269.0 lb

## 2021-11-04 DIAGNOSIS — N3000 Acute cystitis without hematuria: Secondary | ICD-10-CM

## 2021-11-04 DIAGNOSIS — M5136 Other intervertebral disc degeneration, lumbar region: Secondary | ICD-10-CM

## 2021-11-04 DIAGNOSIS — I480 Paroxysmal atrial fibrillation: Secondary | ICD-10-CM | POA: Diagnosis not present

## 2021-11-04 DIAGNOSIS — D508 Other iron deficiency anemias: Secondary | ICD-10-CM

## 2021-11-04 DIAGNOSIS — F411 Generalized anxiety disorder: Secondary | ICD-10-CM | POA: Diagnosis not present

## 2021-11-04 DIAGNOSIS — F3341 Major depressive disorder, recurrent, in partial remission: Secondary | ICD-10-CM

## 2021-11-04 DIAGNOSIS — Z6841 Body Mass Index (BMI) 40.0 and over, adult: Secondary | ICD-10-CM

## 2021-11-04 MED ORDER — POTASSIUM CHLORIDE CRYS ER 20 MEQ PO TBCR: 20.0000 meq | EXTENDED_RELEASE_TABLET | Freq: Two times a day (BID) | ORAL | 1 refills | Status: DC

## 2021-11-04 MED ORDER — CIPROFLOXACIN HCL 500 MG PO TABS: 500.0000 mg | ORAL_TABLET | Freq: Two times a day (BID) | ORAL | 0 refills | Status: AC

## 2021-11-04 MED ORDER — GABAPENTIN 600 MG PO TABS: 900.0000 mg | ORAL_TABLET | Freq: Three times a day (TID) | ORAL | 1 refills | Status: DC

## 2021-11-04 NOTE — Progress Notes (Addendum)
New Patient Office Visit  Subjective:  Patient ID: Tammy [name], female    DOB: [birth year]  Age: 61 y.o. MRN: [id number]  CC:  Chief Complaint  Patient presents with   Establish Care    HPI Tammy [name] presents for new pt to est-changed ins.  Has chronic LBP-DDD-told 3 discs fused together.  Chronic back pain long time.  Hervey Ard and achey.  Occ numbness in thighs. No weakness.  OTC not work.  Has done PT in past.  No surgery no injections ever.  Wants something for pain.  Not on NSIAD's d/t gastric bypass but I told her can't be d/t Eliquis either.  Wrist fx in May and surg so had opiates left over so takes occ fro back pain Sees mental health provider-[facility]-Depression/anxiety. No SI.  Fairly stable on meds.  Still some anxiety.  On Klonopin but wants to go to xanax.  A fib-not seen in awhile.  Not monitor since dx and on meds last yr.  Jan 2022. Was dx w/pneumonia./afib.  on metoprolol for rate and elequis for DOAC.  Occ palp w/o symptoms. Can be at rest.  About once/wk. On eliquis.  No excessive bleeding.  Gastric bypass 2007.  Not on supps, but just recently restarted B12 and iron as "mild anemia" 4  Anemia-never colon and not want. Has gastric bypass but not taking supplements 5.  Went to ER for Morgan and disoriented.  And UTI symptoms-not tx'd for UTI and still w/dysuria  Past Medical History:  Diagnosis Date   Anemia    Anxiety    Arthritis    neck and lower back - deg disc disease, fingers   Atrial fibrillation and flutter (HCC)    Depression    Frequency of urination    GERD (gastroesophageal reflux disease)    History of seizure per pt no seizure since    11-07-2015  in setting of stress and sleep deprivation  /  negative work-up by neurologist unknown idiology   Injury of thumb, left    03-31-2016     Pelvic relaxation    SUI (stress urinary incontinence, female)     Past Surgical History:  Procedure Laterality Date   ANTERIOR AND POSTERIOR REPAIR   09/25/2012   Procedure: ANTERIOR (CYSTOCELE) AND POSTERIOR REPAIR (RECTOCELE);  Surgeon: Lahoma Crocker, MD;  Location: [facility];  Service: Gynecology;  Laterality: N/A;   ANTERIOR AND POSTERIOR REPAIR WITH SACROSPINOUS FIXATION N/A 04/12/2016   Procedure: ANTERIOR AND POSTERIOR REPAIR WITH SACROSPINOUS LIGAMENT SUSPENSION, CYSTOSCOPY;  Surgeon: Arvella Nigh, MD;  Location: [facility];  Service: Gynecology;  Laterality: N/A;   APPENDECTOMY  1987   BREAST EXCISIONAL BIOPSY Left    No scar visable    BREAST REDUCTION SURGERY  2000  aprrox   ENDOMETRIAL ABLATION W/ NOVASURE  2011   in office   EXCISION LEFT BREAST BX  12-07-1999   benign   LAPAROSCOPIC CHOLECYSTECTOMY  02-09-2005   LAPAROSCOPIC VAGINAL HYSTERECTOMY WITH SALPINGO OOPHORECTOMY Bilateral 04/12/2016   Procedure: LAPAROSCOPIC ASSISTED VAGINAL HYSTERECTOMY WITH SALPINGO OOPHORECTOMY;  Surgeon: Arvella Nigh, MD;  Location: [facility];  Service: Gynecology;  Laterality: Bilateral;   ORIF RADIAL FRACTURE Left 02/09/2021   Procedure: ORIF LEFT DISTAL RADIUS FRACTURE;  Surgeon: Cindra Presume, MD;  Location: [facility];  Service: Plastics;  Laterality: Left;  90 min   REDUCTION MAMMAPLASTY Bilateral    ROUX-EN-Y GASTRIC BYPASS  06/  2002   TUBAL LIGATION  10/ 1990  Family History  Problem Relation Age of Onset   Hypertension Mother    Hearing loss Mother    Arthritis Mother    Colon polyps Mother    Hearing loss Father    Arthritis Father    Heart disease Father 46       CABG   Depression Sister    Arthritis Sister    Depression Daughter    Arthritis Daughter    Alcohol abuse Daughter    Breast cancer Maternal Aunt    Diabetes Maternal Aunt        Type I   Stomach cancer Maternal Uncle    Colon cancer Maternal Uncle        x3   Diabetes Maternal Uncle        x5 - Type II   Stomach cancer Maternal Grandmother    Diabetes Maternal Grandmother    Heart disease Maternal Grandmother    Heart  disease Paternal Grandmother    Esophageal cancer Neg Hx    Gallbladder disease Neg Hx    Seizures Neg Hx     Social History   Socioeconomic History   Marital status: Married    Spouse name: Lanecia Sliva   Number of children: 3   Years of education: 16   Highest education level: Not on file  Occupational History   Occupation: [employer]  Tobacco Use   Smoking status: Never   Smokeless tobacco: Never  Vaping Use   Vaping Use: Never used  Substance and Sexual Activity   Alcohol use: No    Comment: drinks daily - none since 08/2012 per pt.   Drug use: No   Sexual activity: Yes    Birth control/protection: Surgical  Other Topics Concern   Not on file  Social History Narrative   Lives with spouse   Caffeine use: 2-3 cups per day   [employer] business analyst   Social Determinants of Health   Financial Resource Strain: Not on file  Food Insecurity: Not on file  Transportation Needs: Not on file  Physical Activity: Not on file  Stress: Not on file  Social Connections: Not on file  Intimate Partner Violence: Not on file    ROS: negative/noncontributory except as in HPI  Objective:   Today's Vitals: BP 126/78    Pulse 78    Temp 97.6 F (36.4 C) (Temporal)    Ht 5\' 6"  (1.676 m)    Wt 269 lb (122 kg)    LMP 09/30/2012    SpO2 96%    BMI 43.42 kg/m   Gen: WDWN NAD MOWF HEENT: NCAT, conjunctiva not injected, sclera nonicteric TM WNL B, OP moist, no exudates  NECK:  supple, no thyromegaly, no nodes, no carotid bruits CARDIAC: RRR, S1S2+, no murmur. DP 2+B LUNGS: CTAB. No wheezes ABDOMEN:  BS+, soft, NTND, No HSM, no masses EXT:  no edema MSK: no gross abnormalities. +TTP lower back/SI area NEURO: A&O x3.  CN II-XII intact.  PSYCH: normal mood. Good eye contact . Flat affect  Assessment & Plan:   Problem List Items Addressed This Visit       Cardiovascular and Mediastinum   AF (paroxysmal atrial fibrillation) (HCC)     Musculoskeletal and Integument    Degenerative disc disease, lumbar - Primary     Other   Depression   Generalized anxiety disorder   Iron deficiency anemia   Other Visit Diagnoses     Acute cystitis without hematuria  1.  Low back pain/degenerative disc disease-cannot take NSAIDs due to gastric bypass and Eliquis.  Tylenol does not work.  Advised no narcotics as she is already on benzos (and not willing to get off) she has done physical therapy in the past.  Will increase gabapentin.  Take Tylenol.  Will refer to pain management. 2.  Depression/anxiety-fair control but not optimal.  Managed by psych.  Patient would like to change Klonopin back to Xanax.  Advised she needs to speak with psych about this.  As she is very complex and on a complex regimen of meds, she needs to continue care with psych 3.  Atrial fibrillation-seems to have started during an acute illness.  Has not seen cardiology in a long time.  Patient is wondering if she can get off of medications.  Will refer 4.  Chronic iron deficiency anemia-patient is not taking post gastric bypass supplements.  Advised that she needs to take vitamin B12 and iron daily.  Reviewed labs done in the hospital.  We will need to monitor closely. 5.  UTI-reviewed hospital records.  Sensitive to Cipro and Bactrim, however she is allergic to Bactrim.  We will do Cipro 6.  Morbid obesity-s/p gastric bypass many yrs ago-working on diet  Spent 30 minutes reviewing hosp records-freq admissions for mental status changes-thought to be overuse of benzos and opiates. These were spefically what she was requesting from me.  Pdmp-klonazepam #90 monthly.  Last rx benzo 02/25/21   Outpatient Encounter Medications as of 11/04/2021  Medication Sig   buPROPion (WELLBUTRIN XL) 150 MG 24 hr tablet Take 2 tablets (300 mg total) by mouth daily.   ciprofloxacin (CIPRO) 500 MG tablet Take 1 tablet (500 mg total) by mouth 2 (two) times daily for 3 days.   clonazePAM (KLONOPIN) 1 MG tablet Take 1  tablet (1 mg total) by mouth 2 (two) times daily.   ELIQUIS 5 MG TABS tablet TAKE ONE TABLET BY MOUTH TWICE A DAY (Patient taking differently: Take 5 mg by mouth 2 (two) times daily.)   FLUoxetine (PROZAC) 40 MG capsule Take 40 mg by mouth at bedtime.   Melatonin 10 MG TABS Take 10 mg by mouth at bedtime as needed (sleep).   metoprolol succinate (TOPROL-XL) 25 MG 24 hr tablet Take 1 tablet (25 mg total) by mouth at bedtime.   OLANZapine (ZYPREXA) 10 MG tablet Take 10 mg by mouth at bedtime.   [DISCONTINUED] gabapentin (NEURONTIN) 600 MG tablet Take 1 tablet (600 mg total) by mouth 2 (two) times daily.   [DISCONTINUED] KLOR-CON M20 20 MEQ tablet TAKE ONE TABLET BY MOUTH DAILY (Patient taking differently: 20 mEq daily.)   gabapentin (NEURONTIN) 600 MG tablet Take 1.5 tablets (900 mg total) by mouth 3 (three) times daily.   potassium chloride SA (KLOR-CON M20) 20 MEQ tablet Take 1 tablet (20 mEq total) by mouth 2 (two) times daily.   No facility-administered encounter medications on file as of 11/04/2021.    Follow-up: Return in about 4 weeks (around 12/02/2021) for mult.   Wellington Hampshire, MD

## 2021-11-04 NOTE — Patient Instructions (Signed)
Welcome to [facility]! It was a pleasure meeting you today.  As discussed, Please schedule a 1 month follow up visit today.  Gabapentin 900mg  twice day for 5 days, then 900mg  twice/day and 600mg  at [employer].  For 5 days, then 900mg  three times/d  Iron daily, B12 daily, Vitamin D 1000 iu/d.  PLEASE NOTE:  If you had any LAB tests please let us know if you have not heard back within a few days. You may see your results on MyChart before we have a chance to review them but we will give you a call once they are reviewed by [place]. If we ordered any REFERRALS today, please let us know if you have not heard from their office within the next week.  Let us know through MyChart if you are needing REFILLS, or have your pharmacy send [place] the request. You can also use MyChart to communicate with me or any office staff.  Please try these tips to maintain a healthy lifestyle:  Eat most of your calories during the day when you are active. Eliminate processed foods including packaged sweets (pies, cakes, cookies), reduce intake of potatoes, white bread, white pasta, and white rice. Look for whole grain options, oat flour or almond flour.  Each meal should contain half fruits/vegetables, one quarter protein, and one quarter carbs (no bigger than a computer mouse).  Cut down on sweet beverages. This includes juice, soda, and sweet tea. Also watch fruit intake, though this is a healthier sweet option, it still contains natural sugar! Limit to 3 servings daily.  Drink at least 1 glass of water with each meal and aim for at least 8 glasses per day  Exercise at least 150 minutes every week.

## 2021-11-05 DIAGNOSIS — M51369 Other intervertebral disc degeneration, lumbar region without mention of lumbar back pain or lower extremity pain: Secondary | ICD-10-CM | POA: Insufficient documentation

## 2021-11-05 DIAGNOSIS — M5136 Other intervertebral disc degeneration, lumbar region: Secondary | ICD-10-CM | POA: Insufficient documentation

## 2021-11-05 NOTE — Addendum Note (Signed)
Addended by: Wellington Hampshire on: 11/05/2021 02:02 PM   Modules accepted: Orders

## 2021-12-02 ENCOUNTER — Ambulatory Visit (INDEPENDENT_AMBULATORY_CARE_PROVIDER_SITE_OTHER): Payer: 59 | Admitting: Family Medicine

## 2021-12-02 ENCOUNTER — Encounter: Payer: Self-pay | Admitting: Family Medicine

## 2021-12-02 ENCOUNTER — Other Ambulatory Visit: Payer: Self-pay

## 2021-12-02 VITALS — BP 124/82 | HR 66 | Temp 97.7°F | Ht 66.0 in | Wt 272.0 lb

## 2021-12-02 DIAGNOSIS — Z9884 Bariatric surgery status: Secondary | ICD-10-CM | POA: Insufficient documentation

## 2021-12-02 DIAGNOSIS — Z1231 Encounter for screening mammogram for malignant neoplasm of breast: Secondary | ICD-10-CM

## 2021-12-02 DIAGNOSIS — I480 Paroxysmal atrial fibrillation: Secondary | ICD-10-CM

## 2021-12-02 DIAGNOSIS — D508 Other iron deficiency anemias: Secondary | ICD-10-CM

## 2021-12-02 DIAGNOSIS — Z23 Encounter for immunization: Secondary | ICD-10-CM | POA: Diagnosis not present

## 2021-12-02 DIAGNOSIS — Z1159 Encounter for screening for other viral diseases: Secondary | ICD-10-CM

## 2021-12-02 DIAGNOSIS — F3341 Major depressive disorder, recurrent, in partial remission: Secondary | ICD-10-CM | POA: Diagnosis not present

## 2021-12-02 DIAGNOSIS — Z1211 Encounter for screening for malignant neoplasm of colon: Secondary | ICD-10-CM

## 2021-12-02 DIAGNOSIS — F411 Generalized anxiety disorder: Secondary | ICD-10-CM

## 2021-12-02 DIAGNOSIS — Z6841 Body Mass Index (BMI) 40.0 and over, adult: Secondary | ICD-10-CM | POA: Diagnosis not present

## 2021-12-02 DIAGNOSIS — M5136 Other intervertebral disc degeneration, lumbar region: Secondary | ICD-10-CM

## 2021-12-02 NOTE — Progress Notes (Signed)
? ?Subjective:  ? ? ? Patient ID: Tammy [name], female    DOB: 15-Sep-1961, 61 y.o.   MRN: [id number] ? ?Chief Complaint  ?Patient presents with  ? Follow-up  ?  4 week follow-up ?Fasting for blood work, had coffee ?  ? ? ?HPI ? A fib-hasn't called Card.  Occ palp.  Taking metoprolol and eliquis ?Iron deficiencey anemia-prob from inadequate intake(gastric bypass history)taking iron daily-no SE.  Tired a lot still. ?Gastric bypass status-wasn't taking supps-back on B12 complex and iron but not vits. ?Chronic LBP-DDD-referral was placed for Pain mgmt, but not take ins so will check on status ?Moods-seeing MH. Stable-no SI ?Colon-never-no bleeding. No FH colon ca ? ?Health Maintenance Due  ?Topic Date Due  ? Hepatitis C Screening  Never done  ? COLONOSCOPY (Pts 45-82yrs Insurance coverage will need to be confirmed)  Never done  ? MAMMOGRAM  04/28/2019  ? ? ?Past Medical History:  ?Diagnosis Date  ? Anemia   ? Anxiety   ? Arthritis   ? neck and lower back - deg disc disease, fingers  ? Atrial fibrillation and flutter ([place])   ? Depression   ? Frequency of urination   ? GERD (gastroesophageal reflux disease)   ? History of seizure per pt no seizure since   ? 11-07-2015  in setting of stress and sleep deprivation  /  negative work-up by neurologist unknown idiology  ? Injury of thumb, left   ? 03-31-2016    ? Pelvic relaxation   ? SUI (stress urinary incontinence, female)   ? ? ?Past Surgical History:  ?Procedure Laterality Date  ? ANTERIOR AND POSTERIOR REPAIR  09/25/2012  ? Procedure: ANTERIOR (CYSTOCELE) AND POSTERIOR REPAIR (RECTOCELE);  Surgeon: Lahoma Crocker, MD;  Location: [facility];  Service: Gynecology;  Laterality: N/A;  ? ANTERIOR AND POSTERIOR REPAIR WITH SACROSPINOUS FIXATION N/A 04/12/2016  ? Procedure: ANTERIOR AND POSTERIOR REPAIR WITH SACROSPINOUS LIGAMENT SUSPENSION, CYSTOSCOPY;  Surgeon: Arvella Nigh, MD;  Location: [facility];  Service: Gynecology;  Laterality: N/A;  ? APPENDECTOMY  1987   ? BREAST EXCISIONAL BIOPSY Left   ? No scar visable   ? BREAST REDUCTION SURGERY  2000  aprrox  ? ENDOMETRIAL ABLATION W/ NOVASURE  2011   in office  ? EXCISION LEFT BREAST BX  12-07-1999  ? benign  ? LAPAROSCOPIC CHOLECYSTECTOMY  02-09-2005  ? LAPAROSCOPIC VAGINAL HYSTERECTOMY WITH SALPINGO OOPHORECTOMY Bilateral 04/12/2016  ? Procedure: LAPAROSCOPIC ASSISTED VAGINAL HYSTERECTOMY WITH SALPINGO OOPHORECTOMY;  Surgeon: Arvella Nigh, MD;  Location: [facility];  Service: Gynecology;  Laterality: Bilateral;  ? ORIF RADIAL FRACTURE Left 02/09/2021  ? Procedure: ORIF LEFT DISTAL RADIUS FRACTURE;  Surgeon: Cindra Presume, MD;  Location: [facility];  Service: Plastics;  Laterality: Left;  90 min  ? REDUCTION MAMMAPLASTY Bilateral   ? ROUX-EN-Y GASTRIC BYPASS  06/  2002  ? TUBAL LIGATION  10/ 1990  ? ? ?Outpatient Medications Prior to Visit  ?Medication Sig Dispense Refill  ? buPROPion (WELLBUTRIN XL) 150 MG 24 hr tablet Take 2 tablets (300 mg total) by mouth daily.    ? clonazePAM (KLONOPIN) 1 MG tablet Take 1 tablet (1 mg total) by mouth 2 (two) times daily. 30 tablet 0  ? ELIQUIS 5 MG TABS tablet TAKE ONE TABLET BY MOUTH TWICE A DAY (Patient taking differently: Take 5 mg by mouth 2 (two) times daily.) 180 tablet 1  ? FLUoxetine (PROZAC) 40 MG capsule Take 40 mg by mouth at bedtime.    ?  gabapentin (NEURONTIN) 600 MG tablet Take 1.5 tablets (900 mg total) by mouth 3 (three) times daily. 540 tablet 1  ? Melatonin 10 MG TABS Take 10 mg by mouth at bedtime as needed (sleep).    ? metoprolol succinate (TOPROL-XL) 25 MG 24 hr tablet Take 1 tablet (25 mg total) by mouth at bedtime. 90 tablet 4  ? OLANZapine (ZYPREXA) 10 MG tablet Take 10 mg by mouth at bedtime.    ? potassium chloride SA (KLOR-CON M20) 20 MEQ tablet Take 1 tablet (20 mEq total) by mouth 2 (two) times daily. 180 tablet 1  ? ?No facility-administered medications prior to visit.  ? ? ?Allergies  ?Allergen Reactions  ? Nsaids Other (See Comments)  ?  Post  Gastric Bypass  ? Adhesive [Tape] Itching and Rash  ? Latex Itching, Rash and Other (See Comments)  ? Sulfa Antibiotics Itching and Rash  ? ?ROS neg/noncontributory except as noted HPI/below ?ROS: ?Gen: no fever, chills  ?Skin: no rash, itching ?ENT: no ear pain, ear drainage, nasal congestion, rhinorrhea, sinus pressure, sore throat ?Eyes: no blurry vision, double vision ?Resp: no cough, wheeze,SOB ?Breast: no breast tenderness, no nipple discharge, no breast masses ?CV: no CP, palpitations, LE edema,  ?GI: no heartburn, n/v/d/c, abd pain ?GU: no dysuria, urgency, frequency, hematuria ?MSK: chronic back ?Neuro: no dizziness, headache, weakness, vertigo ?Psych: HPI ? ? ?   ?Objective:  ?  ? ?BP 124/82   Pulse 66   Temp 97.7 ?F (36.5 ?C) (Temporal)   Ht 5\' 6"  (1.676 m)   Wt 272 lb (123.4 kg)   LMP 09/30/2012   SpO2 95%   BMI 43.90 kg/m?  ?Wt Readings from Last 3 Encounters:  ?12/02/21 272 lb (123.4 kg)  ?11/04/21 269 lb (122 kg)  ?10/24/21 268 lb 11.9 oz (121.9 kg)  ? ? ?   ? ?Gen: WDWN NAD MOWF ?HEENT: NCAT, conjunctiva not injected, sclera nonicteric ?NECK:  supple, no thyromegaly, no nodes, no carotid bruits ?CARDIAC: RRR, S1S2+, no murmur. DP 2+B ?LUNGS: CTAB. No wheezes ?ABDOMEN:  BS+, soft, NTND, No HSM, no masses ?EXT:  no edema ?MSK: no gross abnormalities.  ?NEURO: A&O x3.  CN II-XII intact.  ?PSYCH: normal mood. Good eye contact ? ?Assessment & Plan:  ? ?Problem List Items Addressed This Visit   ? ?  ? Cardiovascular and Mediastinum  ? AF (paroxysmal atrial fibrillation) ([place]) - Primary  ?  ? Musculoskeletal and Integument  ? Degenerative disc disease, lumbar  ?  ? Other  ? Morbid obesity with BMI of 50.0-59.9, adult (HCC) (Chronic)  ? Depression  ? Generalized anxiety disorder  ? Iron deficiency anemia  ? Relevant Orders  ? CBC with Differential/Platelet  ? Comprehensive metabolic panel  ? Hemoglobin A1c  ? IBC + Ferritin  ? Vitamin B12  ? Magnesium  ? Folate  ? History of gastric bypass  ? Relevant  Orders  ? CBC with Differential/Platelet  ? Comprehensive metabolic panel  ? Hemoglobin A1c  ? Lipid panel  ? IBC + Ferritin  ? Hepatitis C antibody  ? Vitamin B12  ? VITAMIN D 25 Hydroxy (Vit-D Deficiency, Fractures)  ? Magnesium  ? Folate  ? ?Other Visit Diagnoses   ? ? Screen for colon cancer      ? Relevant Orders  ? Cologuard  ? Encounter for hepatitis C screening test for low risk patient      ? Screening mammogram for breast cancer      ? Relevant  Orders  ? MM Digital Screening  ? Need for Tdap vaccination      ? Relevant Orders  ? Tdap vaccine greater than or equal to 7yo IM (Completed)  ? ?  ? Afib-stable on meds.  Advised to f/u Card.  Cont meds ?Iron def anemia-prob dietary-back on iron-check labs ?Gastric bypass status-on B complex-add Multi.  Check labs inc D, Mg ?Chronic LBP-checking on referral to pain mgmt ?Anx/depression-managed by psych.  Stable. Cont meds ?Morbid obesity-has gastric bypass in past-still obese-work on TLC ?Screening-cologard, sch mamm, Tdap updated. Check hep C ? ?F/u 6 mo ? ?No orders of the defined types were placed in this encounter. ? ? ?Wellington Hampshire, MD ? ?

## 2021-12-02 NOTE — Patient Instructions (Addendum)
It was very nice to see you today! ? ?Call Cardiology-Dr. Percival Spanish. ?Mammogram-call [facility]-breast ? ?Take multivitamin ? ?PLEASE NOTE: ? ?If you had any lab tests please let us know if you have not heard back within a few days. You may see your results on MyChart before we have a chance to review them but we will give you a call once they are reviewed by [place]. If we ordered any referrals today, please let us know if you have not heard from their office within the next week.  ? ?Please try these tips to maintain a healthy lifestyle: ? ?Eat most of your calories during the day when you are active. Eliminate processed foods including packaged sweets (pies, cakes, cookies), reduce intake of potatoes, white bread, white pasta, and white rice. Look for whole grain options, oat flour or almond flour. ? ?Each meal should contain half fruits/vegetables, one quarter protein, and one quarter carbs (no bigger than a computer mouse). ? ?Cut down on sweet beverages. This includes juice, soda, and sweet tea. Also watch fruit intake, though this is a healthier sweet option, it still contains natural sugar! Limit to 3 servings daily. ? ?Drink at least 1 glass of water with each meal and aim for at least 8 glasses per day ? ?Exercise at least 150 minutes every week.   ?

## 2021-12-03 ENCOUNTER — Other Ambulatory Visit: Payer: Self-pay | Admitting: Family Medicine

## 2021-12-03 DIAGNOSIS — Z1231 Encounter for screening mammogram for malignant neoplasm of breast: Secondary | ICD-10-CM

## 2021-12-03 LAB — COMPREHENSIVE METABOLIC PANEL
ALT: 12 U/L (ref 0–35)
AST: 15 U/L (ref 0–37)
Albumin: 3.9 g/dL (ref 3.5–5.2)
Alkaline Phosphatase: 78 U/L (ref 39–117)
BUN: 11 mg/dL (ref 6–23)
CO2: 28 mEq/L (ref 19–32)
Calcium: 8.8 mg/dL (ref 8.4–10.5)
Chloride: 106 mEq/L (ref 96–112)
Creatinine, Ser: 0.78 mg/dL (ref 0.40–1.20)
GFR: 83.7 mL/min (ref >60.00)
Glucose, Bld: 91 mg/dL (ref 70–99)
Potassium: 3.9 mEq/L (ref 3.5–5.1)
Sodium: 142 mEq/L (ref 135–145)
Total Bilirubin: 0.4 mg/dL (ref 0.2–1.2)
Total Protein: 6.4 g/dL (ref 6.0–8.3)

## 2021-12-03 LAB — MAGNESIUM: Magnesium: 2.2 mg/dL (ref 1.5–2.5)

## 2021-12-03 LAB — CBC WITH DIFFERENTIAL/PLATELET
Basophils Absolute: 0.1 10*3/uL (ref 0.0–0.1)
Basophils Relative: 1.1 % (ref 0.0–3.0)
Eosinophils Absolute: 0.1 10*3/uL (ref 0.0–0.7)
Eosinophils Relative: 2.1 % (ref 0.0–5.0)
HCT: 33.6 % — ABNORMAL LOW (ref 36.0–46.0)
Hemoglobin: 10.6 g/dL — ABNORMAL LOW (ref 12.0–15.0)
Lymphocytes Relative: 21.4 % (ref 12.0–46.0)
Lymphs Abs: 1.3 10*3/uL (ref 0.7–4.0)
MCHC: 31.7 g/dL (ref 30.0–36.0)
MCV: 80.2 fl (ref 78.0–100.0)
Monocytes Absolute: 0.3 10*3/uL (ref 0.1–1.0)
Monocytes Relative: 4.9 % (ref 3.0–12.0)
Neutro Abs: 4.2 10*3/uL (ref 1.4–7.7)
Neutrophils Relative %: 70.5 % (ref 43.0–77.0)
Platelets: 289 10*3/uL (ref 150.0–400.0)
RBC: 4.19 Mil/uL (ref 3.87–5.11)
RDW: 26.9 % — ABNORMAL HIGH (ref 11.5–15.5)
WBC: 6 10*3/uL (ref 4.0–10.5)

## 2021-12-03 LAB — IBC + FERRITIN
Ferritin: 24.4 ng/mL (ref 10.0–291.0)
Iron: 277 ug/dL — ABNORMAL HIGH (ref 42–145)
Saturation Ratios: 85.3 % — ABNORMAL HIGH (ref 20.0–50.0)
TIBC: 324.8 ug/dL (ref 250.0–450.0)
Transferrin: 232 mg/dL (ref 212.0–360.0)

## 2021-12-03 LAB — VITAMIN B12: Vitamin B-12: 438 pg/mL (ref 211–911)

## 2021-12-03 LAB — LIPID PANEL
Cholesterol: 173 mg/dL (ref 0–200)
HDL: 69.4 mg/dL (ref >39.00)
LDL Cholesterol: 88 mg/dL (ref 0–99)
NonHDL: 103.75
Total CHOL/HDL Ratio: 2
Triglycerides: 80 mg/dL (ref 0.0–149.0)
VLDL: 16 mg/dL (ref 0.0–40.0)

## 2021-12-03 LAB — HEPATITIS C ANTIBODY
Hepatitis C Ab: NONREACTIVE
SIGNAL TO CUT-OFF: 0.04 (ref <1.00)

## 2021-12-03 LAB — FOLATE: Folate: >24.2 ng/mL (ref >5.9)

## 2021-12-03 LAB — HEMOGLOBIN A1C: Hgb A1c MFr Bld: 4.7 % (ref 4.6–6.5)

## 2021-12-03 LAB — VITAMIN D 25 HYDROXY (VIT D DEFICIENCY, FRACTURES): VITD: 21.4 ng/mL — ABNORMAL LOW (ref 30.00–100.00)

## 2021-12-10 ENCOUNTER — Ambulatory Visit (INDEPENDENT_AMBULATORY_CARE_PROVIDER_SITE_OTHER): Payer: 59

## 2021-12-10 DIAGNOSIS — Z1231 Encounter for screening mammogram for malignant neoplasm of breast: Secondary | ICD-10-CM | POA: Diagnosis not present

## 2022-01-20 ENCOUNTER — Encounter: Payer: Self-pay | Admitting: Orthopaedic Surgery

## 2022-01-20 ENCOUNTER — Ambulatory Visit (INDEPENDENT_AMBULATORY_CARE_PROVIDER_SITE_OTHER): Payer: 59

## 2022-01-20 ENCOUNTER — Ambulatory Visit (INDEPENDENT_AMBULATORY_CARE_PROVIDER_SITE_OTHER): Payer: 59 | Admitting: Orthopaedic Surgery

## 2022-01-20 VITALS — Ht 66.0 in | Wt 272.0 lb

## 2022-01-20 DIAGNOSIS — M25561 Pain in right knee: Secondary | ICD-10-CM | POA: Diagnosis not present

## 2022-01-20 DIAGNOSIS — M722 Plantar fascial fibromatosis: Secondary | ICD-10-CM | POA: Diagnosis not present

## 2022-01-20 MED ORDER — METHYLPREDNISOLONE ACETATE 40 MG/ML IJ SUSP
40.0000 mg | INTRAMUSCULAR | Status: AC | PRN
  Administered 2022-01-20: 40 mg via INTRA_ARTICULAR

## 2022-01-20 MED ORDER — LIDOCAINE HCL 1 % IJ SOLN
3.0000 mL | INTRAMUSCULAR | Status: AC | PRN
  Administered 2022-01-20: 3 mL

## 2022-01-20 MED ORDER — METHYLPREDNISOLONE ACETATE 40 MG/ML IJ SUSP
40.0000 mg | INTRAMUSCULAR | Status: AC | PRN
  Administered 2022-01-20: 40 mg

## 2022-01-20 MED ORDER — LIDOCAINE HCL 1 % IJ SOLN
1.0000 mL | INTRAMUSCULAR | Status: AC | PRN
  Administered 2022-01-20: 1 mL

## 2022-01-20 NOTE — Progress Notes (Signed)
? ?Office Visit Note ?  ?Patient: Tammy [name]           ?Date of Birth: June 28, 1961           ?MRN: [id number] ?Visit Date: 01/20/2022 ?             ?Requested by: Tawnya Crook, MD ?[address] ?[place],  [place] [address] ?PCP: Tawnya Crook, MD ? ? ?Assessment & Plan: ?Visit Diagnoses:  ?1. Acute pain of right knee   ?2. Plantar fasciitis of left foot   ?3. Plantar fasciitis of right foot   ? ? ?Plan: I did recommend weight loss for her knee and a plantar fasciitis as well as better shoe wear.  She has been wearing crocs and I do feel that she needs firmer shoes to wear.  I talked her about stretching exercises for the plantar fascia.  I did place steroid injections in her right knee and both of her feet of the plantar fascia.  She cannot take anti-inflammatories since she is on Eliquis.  I did let her know that if her right knee continues to bother her to give [place] a call because our next step would be considering a MRI of the right knee to rule out a meniscal tear.  For the interim she will work on weight loss and quad strengthening exercises.  She did tolerate all injections well.  All questions and concerns were answered and addressed.  Follow-up is as needed. ? ?Follow-Up Instructions: Return if symptoms worsen or fail to improve.  ? ?Orders:  ?Orders Placed This Encounter  ?Procedures  ? Foot Inj  ? Large Joint Inj  ? XR Knee 1-2 Views Right  ? ?No orders of the defined types were placed in this encounter. ? ? ? ? Procedures: ?Foot Inj ? ?Date/Time: 01/20/2022 1:35 PM ?Performed by: Mcarthur Rossetti, MD ?Authorized by: Mcarthur Rossetti, MD  ? ?Condition: Plantar Fasciitis   ?Location: left plantar fascia muscle and right plantar fascia muscle   ?Medications:  1 mL lidocaine 1 %; 40 mg methylPREDNISolone acetate 40 MG/ML ?Large Joint Inj: R knee on 01/20/2022 1:40 PM ?Indications: diagnostic evaluation and pain ?Details: 22 G 1.5 in needle, superolateral approach ? ?Arthrogram:  No ? ?Medications: 3 mL lidocaine 1 %; 40 mg methylPREDNISolone acetate 40 MG/ML ?Outcome: tolerated well, no immediate complications ?Procedure, treatment alternatives, risks and benefits explained, specific risks discussed. Consent was given by the patient. Immediately prior to procedure a time out was called to verify the correct patient, procedure, equipment, support staff and site/side marked as required. Patient was prepped and draped in the usual sterile fashion.  ? ? ? ? ?Clinical Data: ?No additional findings. ? ? ?Subjective: ?Chief Complaint  ?Patient presents with  ? Right Knee - Pain  ? Right Foot - Pain  ? Left Foot - Pain  ?The patient is a 61 year old female who comes in today requesting bilateral plantar fascia injections.  She has had these in the past.  She does wear crocs and that does contribute to her plantar fasciitis.  She says the injections have helped her greatly.  Her BMI is almost 44.  She also comes in today with acute right knee pain.  She says her knee popped about 2 weeks ago and she had pain and swelling since then.  She has not injured this knee before and has never had an injection in the right knee.  She said the swelling has gone down but  the knee still hurts and it seems like it is on the medial aspect of the knee.  She denies being a diabetic ? ?HPI ? ?Review of Systems ?There is currently listed no fever, chills, nausea, vomiting ? ?Objective: ?Vital Signs: Ht '5\' 6"'$  (1.676 m)   Wt 272 lb (123.4 kg)   LMP 09/30/2012   BMI 43.90 kg/m?  ? ?Physical Exam ?She is alert and oriented x3 and in no acute distress ?Ortho Exam ?Examination of her right knee shows no effusion today.  She has full range of motion of the knee but there is patellofemoral crepitation that is tender and some medial tenderness.  The knee is ligamentously stable.  Examination of both feet does show some pain over the plantar fascia on both sides.  There is no wounds on either feet in the foot contour seem to  be normal. ?Specialty Comments:  ?No specialty comments available. ? ?Imaging: ?XR Knee 1-2 Views Right ? ?Result Date: 01/20/2022 ?2 views of the right knee show no acute findings.  There is patellofemoral arthritic changes.  The medial lateral compartments are well-maintained.  ? ? ?PMFS History: ?Patient Active Problem List  ? Diagnosis Date Noted  ? History of gastric bypass 12/02/2021  ? Degenerative disc disease, lumbar 11/05/2021  ? Hallucinations 10/24/2021  ? Weakness 10/24/2021  ? AF (paroxysmal atrial fibrillation) ([place]) 10/24/2021  ? OSA (obstructive sleep apnea) 10/24/2021  ? Encephalopathy 09/17/2021  ? Hyponatremia 09/17/2021  ? Anemia 09/17/2021  ? Iron deficiency anemia 09/17/2021  ? Closed nondisplaced fracture of neck of right radius 07/21/2020  ? Somnolence   ? Aspiration pneumonia ([place]) 02/20/2020  ? Lactic acidosis 02/20/2020  ? Acute metabolic encephalopathy 97/11/6376  ? Sepsis ([place]) 02/20/2020  ? Chronic pain of both knees 01/08/2020  ? Encephalopathy acute 10/27/2019  ? Acute encephalopathy 10/27/2019  ? CAP (community acquired pneumonia) 07/31/2019  ? Atrial fibrillation with RVR ([place]) 07/31/2019  ? Morbid obesity with BMI of 50.0-59.9, adult ([place]) 07/31/2019  ? Respiratory failure ([place]) 04/14/2019  ? Community acquired pneumonia 04/14/2019  ? Closed nondisplaced fracture of head of left radius 06/20/2018  ? Sprain of left wrist 06/20/2018  ? Peroneal tendinitis, right leg 06/23/2017  ? Trochanteric bursitis, right hip 06/23/2017  ? Acute right-sided low back pain with left-sided sciatica 03/24/2017  ? Pain in right hip 03/24/2017  ? Impingement syndrome of left shoulder 08/30/2016  ? Impingement syndrome of right shoulder 08/30/2016  ? Plantar fasciitis, bilateral 08/30/2016  ? Pelvic relaxation 04/13/2016  ?  Class: Present on Admission  ? S/P hysterectomy 04/12/2016  ? Depression 11/16/2015  ? Generalized anxiety disorder 11/16/2015  ? Convulsion ([place]) 11/16/2015  ? SUI (stress urinary  incontinence, female) 09/26/2012  ? ?Past Medical History:  ?Diagnosis Date  ? Anemia   ? Anxiety   ? Arthritis   ? neck and lower back - deg disc disease, fingers  ? Atrial fibrillation and flutter ([place])   ? Depression   ? Frequency of urination   ? GERD (gastroesophageal reflux disease)   ? History of seizure per pt no seizure since   ? 11-07-2015  in setting of stress and sleep deprivation  /  negative work-up by neurologist unknown idiology  ? Injury of thumb, left   ? 03-31-2016    ? Pelvic relaxation   ? SUI (stress urinary incontinence, female)   ?  ?Family History  ?Problem Relation Age of Onset  ? Hypertension Mother   ? Hearing loss Mother   ?  Arthritis Mother   ? Colon polyps Mother   ? Hearing loss Father   ? Arthritis Father   ? Heart disease Father 88  ?     CABG  ? Depression Sister   ? Arthritis Sister   ? Depression Daughter   ? Arthritis Daughter   ? Alcohol abuse Daughter   ? Breast cancer Maternal Aunt   ? Diabetes Maternal Aunt   ?     Type I  ? Stomach cancer Maternal Uncle   ? Colon cancer Maternal Uncle   ?     x3  ? Diabetes Maternal Uncle   ?     x5 - Type II  ? Stomach cancer Maternal Grandmother   ? Diabetes Maternal Grandmother   ? Heart disease Maternal Grandmother   ? Heart disease Paternal Grandmother   ? Esophageal cancer Neg Hx   ? Gallbladder disease Neg Hx   ? Seizures Neg Hx   ?  ?Past Surgical History:  ?Procedure Laterality Date  ? ANTERIOR AND POSTERIOR REPAIR  09/25/2012  ? Procedure: ANTERIOR (CYSTOCELE) AND POSTERIOR REPAIR (RECTOCELE);  Surgeon: Lahoma Crocker, MD;  Location: [facility];  Service: Gynecology;  Laterality: N/A;  ? ANTERIOR AND POSTERIOR REPAIR WITH SACROSPINOUS FIXATION N/A 04/12/2016  ? Procedure: ANTERIOR AND POSTERIOR REPAIR WITH SACROSPINOUS LIGAMENT SUSPENSION, CYSTOSCOPY;  Surgeon: Arvella Nigh, MD;  Location: [facility];  Service: Gynecology;  Laterality: N/A;  ? APPENDECTOMY  1987  ? BREAST EXCISIONAL BIOPSY Left   ? No scar visable    ? BREAST REDUCTION SURGERY  2000  aprrox  ? ENDOMETRIAL ABLATION W/ NOVASURE  2011   in office  ? EXCISION LEFT BREAST BX  12-07-1999  ? benign  ? LAPAROSCOPIC CHOLECYSTECTOMY  02-09-2005  ? LAPAROSCOPIC VAGINAL

## 2022-01-26 ENCOUNTER — Ambulatory Visit: Payer: 59 | Admitting: Orthopaedic Surgery

## 2022-02-03 ENCOUNTER — Other Ambulatory Visit: Payer: Self-pay

## 2022-02-03 MED ORDER — POTASSIUM CHLORIDE CRYS ER 20 MEQ PO TBCR: 20.0000 meq | EXTENDED_RELEASE_TABLET | Freq: Two times a day (BID) | ORAL | 1 refills | Status: DC

## 2022-02-14 ENCOUNTER — Other Ambulatory Visit: Payer: Self-pay

## 2022-02-14 ENCOUNTER — Emergency Department (HOSPITAL_BASED_OUTPATIENT_CLINIC_OR_DEPARTMENT_OTHER): Payer: 59 | Admitting: Radiology

## 2022-02-14 ENCOUNTER — Encounter (HOSPITAL_BASED_OUTPATIENT_CLINIC_OR_DEPARTMENT_OTHER): Payer: Self-pay | Admitting: Obstetrics and Gynecology

## 2022-02-14 ENCOUNTER — Emergency Department (HOSPITAL_BASED_OUTPATIENT_CLINIC_OR_DEPARTMENT_OTHER)
Admission: EM | Admit: 2022-02-14 | Discharge: 2022-02-14 | Discharge status: Against Medical Advice | Payer: 59 | Attending: Emergency Medicine | Admitting: Emergency Medicine

## 2022-02-14 ENCOUNTER — Emergency Department (HOSPITAL_BASED_OUTPATIENT_CLINIC_OR_DEPARTMENT_OTHER): Payer: 59

## 2022-02-14 DIAGNOSIS — Z79899 Other long term (current) drug therapy: Secondary | ICD-10-CM | POA: Insufficient documentation

## 2022-02-14 DIAGNOSIS — T40601A Poisoning by unspecified narcotics, accidental (unintentional), initial encounter: Secondary | ICD-10-CM | POA: Insufficient documentation

## 2022-02-14 DIAGNOSIS — R400 Somnolence: Secondary | ICD-10-CM | POA: Diagnosis present

## 2022-02-14 DIAGNOSIS — Z91040 Latex allergy status: Secondary | ICD-10-CM | POA: Diagnosis not present

## 2022-02-14 DIAGNOSIS — Z7901 Long term (current) use of anticoagulants: Secondary | ICD-10-CM | POA: Diagnosis not present

## 2022-02-14 DIAGNOSIS — R69 Illness, unspecified: Secondary | ICD-10-CM

## 2022-02-14 LAB — I-STAT ARTERIAL BLOOD GAS, ED
Acid-Base Excess: 4 mmol/L — ABNORMAL HIGH (ref 0.0–2.0)
Bicarbonate: 30.4 mmol/L — ABNORMAL HIGH (ref 20.0–28.0)
Calcium, Ion: 1.22 mmol/L (ref 1.15–1.40)
HCT: 44 % (ref 36.0–46.0)
Hemoglobin: 15 g/dL (ref 12.0–15.0)
O2 Saturation: 86 %
Patient temperature: 36.8
Potassium: 3.8 mmol/L (ref 3.5–5.1)
Sodium: 138 mmol/L (ref 135–145)
TCO2: 32 mmol/L (ref 22–32)
pCO2 arterial: 51.1 mmHg — ABNORMAL HIGH (ref 32–48)
pH, Arterial: 7.382 (ref 7.35–7.45)
pO2, Arterial: 52 mmHg — ABNORMAL LOW (ref 83–108)

## 2022-02-14 LAB — CBC WITH DIFFERENTIAL/PLATELET
Abs Immature Granulocytes: 0.03 K/uL (ref 0.00–0.07)
Basophils Absolute: 0 K/uL (ref 0.0–0.1)
Basophils Relative: 0 %
Eosinophils Absolute: 0.1 K/uL (ref 0.0–0.5)
Eosinophils Relative: 1 %
HCT: 44.5 % (ref 36.0–46.0)
Hemoglobin: 13.8 g/dL (ref 12.0–15.0)
Immature Granulocytes: 0 %
Lymphocytes Relative: 19 %
Lymphs Abs: 1.6 K/uL (ref 0.7–4.0)
MCH: 27.7 pg (ref 26.0–34.0)
MCHC: 31 g/dL (ref 30.0–36.0)
MCV: 89.2 fL (ref 80.0–100.0)
Monocytes Absolute: 0.6 K/uL (ref 0.1–1.0)
Monocytes Relative: 7 %
Neutro Abs: 6.1 K/uL (ref 1.7–7.7)
Neutrophils Relative %: 73 %
Platelets: 199 K/uL (ref 150–400)
RBC: 4.99 MIL/uL (ref 3.87–5.11)
RDW: 15.3 % (ref 11.5–15.5)
WBC: 8.5 K/uL (ref 4.0–10.5)
nRBC: 0 % (ref 0.0–0.2)

## 2022-02-14 LAB — COMPREHENSIVE METABOLIC PANEL WITH GFR
ALT: 19 U/L (ref 0–44)
AST: 22 U/L (ref 15–41)
Albumin: 4.4 g/dL (ref 3.5–5.0)
Alkaline Phosphatase: 83 U/L (ref 38–126)
Anion gap: 9 (ref 5–15)
BUN: 11 mg/dL (ref 6–20)
CO2: 29 mmol/L (ref 22–32)
Calcium: 9.4 mg/dL (ref 8.9–10.3)
Chloride: 101 mmol/L (ref 98–111)
Creatinine, Ser: 0.71 mg/dL (ref 0.44–1.00)
GFR, Estimated: >60 mL/min
Glucose, Bld: 99 mg/dL (ref 70–99)
Potassium: 3.8 mmol/L (ref 3.5–5.1)
Sodium: 139 mmol/L (ref 135–145)
Total Bilirubin: 0.5 mg/dL (ref 0.3–1.2)
Total Protein: 7.5 g/dL (ref 6.5–8.1)

## 2022-02-14 LAB — AMMONIA: Ammonia: 14 umol/L (ref 9–35)

## 2022-02-14 LAB — TROPONIN I (HIGH SENSITIVITY)
Troponin I (High Sensitivity): 13 ng/L (ref <18)
Troponin I (High Sensitivity): 12 ng/L

## 2022-02-14 MED ORDER — NALOXONE HCL 4 MG/0.1ML NA LIQD: 0.4000 mg | Freq: Once | NASAL | 0 refills | Status: AC

## 2022-02-14 MED ORDER — NALOXONE HCL 2 MG/2ML IJ SOSY
1.0000 mg | PREFILLED_SYRINGE | Freq: Once | INTRAMUSCULAR | Status: AC
  Administered 2022-02-14: 1 mg via INTRAVENOUS
  Filled 2022-02-14: qty 2

## 2022-02-14 MED ORDER — ACETAMINOPHEN 500 MG PO TABS
1000.0000 mg | ORAL_TABLET | Freq: Once | ORAL | Status: AC
Start: 2022-02-14 — End: 2022-02-14
  Administered 2022-02-14: 1000 mg via ORAL
  Filled 2022-02-14: qty 2

## 2022-02-14 MED ORDER — IPRATROPIUM-ALBUTEROL 0.5-2.5 (3) MG/3ML IN SOLN
3.0000 mL | Freq: Once | RESPIRATORY_TRACT | Status: AC
  Administered 2022-02-14: 3 mL via RESPIRATORY_TRACT
  Filled 2022-02-14: qty 3

## 2022-02-14 NOTE — ED Notes (Signed)
Pt. And husband were fighting about home medication in the room. He stated he has 20 pain meds at home, Pt states he shares them with her and he stated she stole the medication from him.  ?

## 2022-02-14 NOTE — ED Provider Notes (Signed)
?[facility] ?Provider Note ? ? ?CSN: [id number] ?Arrival date & time: 02/14/22  1228 ? ?  ? ?History ? ?Chief Complaint  ?Patient presents with  ? Shortness of Breath  ? ? ?Tammy [name] is a 61 y.o. female. ? ?HPI ?Patient's husband reports for several days she has been coming more somnolent.  She has been mixing up her words and having some incomprehensible speech.  There was reported shortness of breath although patient is denying it currently.  Patient denies any chest pain.  Patient reported she had a fever but husband reports she has not had a fever.  Husband reports he has seen similar episodes with a urinary tract infection.  We discussed medications but patient's husband reports she will not remember what medications she has taken at this time.  They do not report taking extra medications or suspicion of intentional overdose.  Patient denies she has any pain in the legs.  Her husband does note however that she has complained of pain in both knees and both ankles. ?  ? ?Home Medications ?Prior to Admission medications   ?Medication Sig Start Date End Date Taking? Authorizing Provider  ?naloxone [facility]) nasal spray 4 mg/0.1 mL Place 0.1 sprays (0.4 mg total) into the nose once for 1 dose. 02/14/22 02/14/22 Yes Charlesetta Shanks, MD  ?buPROPion (WELLBUTRIN XL) 150 MG 24 hr tablet Take 2 tablets (300 mg total) by mouth daily. 10/25/21   Nita Sells, MD  ?clonazePAM (KLONOPIN) 1 MG tablet Take 1 tablet (1 mg total) by mouth 2 (two) times daily. 10/25/21   Nita Sells, MD  ?ELIQUIS 5 MG TABS tablet TAKE ONE TABLET BY MOUTH TWICE A DAY ?Patient taking differently: Take 5 mg by mouth 2 (two) times daily. 08/31/21   Minus Breeding, MD  ?FLUoxetine (PROZAC) 40 MG capsule Take 40 mg by mouth at bedtime. 06/23/21   [provider]  ?gabapentin (NEURONTIN) 600 MG tablet Take 1.5 tablets (900 mg total) by mouth 3 (three) times daily. 11/04/21   Tawnya Crook, MD   ?Melatonin 10 MG TABS Take 10 mg by mouth at bedtime as needed (sleep).    [provider]  ?metoprolol succinate (TOPROL-XL) 25 MG 24 hr tablet Take 1 tablet (25 mg total) by mouth at bedtime. 03/27/21   Minus Breeding, MD  ?OLANZapine (ZYPREXA) 10 MG tablet Take 10 mg by mouth at bedtime. 04/04/19   [provider]  ?potassium chloride SA (KLOR-CON M20) 20 MEQ tablet Take 1 tablet (20 mEq total) by mouth 2 (two) times daily. 02/03/22   Minus Breeding, MD  ?   ? ?Allergies    ?Nsaids, Adhesive [tape], Latex, and Sulfa antibiotics   ? ?Review of Systems   ?Review of Systems ?10 systems reviewed and negative except as per HPI ?Physical Exam ?Updated Vital Signs ?BP (!) 164/144   Pulse 98   Temp 98.3 ?F (36.8 ?C) (Oral)   Resp 17   LMP 09/30/2012   SpO2 97%  ?Physical Exam ?Constitutional:   ?   Comments: Patient is somnolent but arouses to voice and tactile stimulus.  Central obesity.  ?HENT:  ?   Mouth/Throat:  ?   Pharynx: Oropharynx is clear.  ?Eyes:  ?   Extraocular Movements: Extraocular movements intact.  ?   Pupils: Pupils are equal, round, and reactive to light.  ?Cardiovascular:  ?   Rate and Rhythm: Normal rate and regular rhythm.  ?Pulmonary:  ?   Comments: No respiratory distress at  rest.  Lungs are clear bilaterally. ?Abdominal:  ?   General: There is no distension.  ?   Palpations: Abdomen is soft.  ?   Tenderness: There is no abdominal tenderness. There is no guarding.  ?Musculoskeletal:  ?   Comments: No peripheral edema.  Calves are soft nontender.  Feet and lower extremities are in good condition without wounds or rash.  No effusions or erythema present at the knees or ankles.  ?Skin: ?   General: Skin is warm and dry.  ?Neurological:  ?   General: No focal deficit present.  ?   Comments: Patient is somnolent but arouses.  When awake she is situationally oriented.  As she drifts off, she might slur her words and has some tangential speech but when fully awake, she is not  exhibiting delirium or expressive or receptive aphasia.  When asked to pass the stickers from her chart over to the nurse, she picked up a couple different things but ultimately was able to find the stickers, pick them up and handed to the nurse.  No focal motor deficits.  ? ? ?ED Results / Procedures / Treatments   ?Labs ?(all labs ordered are listed, but only abnormal results are displayed) ?Labs Reviewed  ?I-STAT ARTERIAL BLOOD GAS, ED - Abnormal; Notable for the following components:  ?    Result Value  ? pCO2 arterial 51.1 (*)   ? pO2, Arterial 52 (*)   ? Bicarbonate 30.4 (*)   ? Acid-Base Excess 4.0 (*)   ? All other components within normal limits  ?CBC WITH DIFFERENTIAL/PLATELET  ?COMPREHENSIVE METABOLIC PANEL  ?AMMONIA  ?URINALYSIS, ROUTINE W REFLEX MICROSCOPIC  ?RAPID URINE DRUG SCREEN, HOSP PERFORMED  ?I-STAT ARTERIAL BLOOD GAS, ED  ?TROPONIN I (HIGH SENSITIVITY)  ?TROPONIN I (HIGH SENSITIVITY)  ? ? ?EKG ?EKG Interpretation ? ?Date/Time:  Sunday Feb 14 2022 12:37:59 EDT ?Ventricular Rate:  92 ?PR Interval:  144 ?QRS Duration: 92 ?QT Interval:  340 ?QTC Calculation: 420 ?R Axis:   47 ?Text Interpretation: Normal sinus rhythm Nonspecific ST abnormality Abnormal ECG When compared with ECG of 24-Oct-2021 17:59, No significant change was found agree. Confirmed by Charlesetta Shanks [id number]) on 02/14/2022 2:56:08 PM ? ?Radiology ?CT Head Wo Contrast ? ?Result Date: 02/14/2022 ?CLINICAL DATA:  Altered mental status EXAM: CT HEAD WITHOUT CONTRAST TECHNIQUE: Contiguous axial images were obtained from the base of the skull through the vertex without intravenous contrast. RADIATION DOSE REDUCTION: This exam was performed according to the departmental dose-optimization program which includes automated exposure control, adjustment of the mA and/or kV according to patient size and/or use of iterative reconstruction technique. COMPARISON:  10/24/2021 FINDINGS: Brain: No evidence of acute infarction, hemorrhage, hydrocephalus,  extra-axial collection or mass lesion/mass effect. Vascular: No hyperdense vessel or unexpected calcification. Skull: Normal. Negative for fracture or focal lesion. Sinuses/Orbits: No acute finding. Other: None. IMPRESSION: No acute intracranial pathology. Electronically Signed   By: Delanna Ahmadi M.D.   On: 02/14/2022 15:53  ? ?DG Chest Port 1 View ? ?Result Date: 02/14/2022 ?CLINICAL DATA:  Shortness of breath. EXAM: PORTABLE CHEST 1 VIEW COMPARISON:  10/24/2021 FINDINGS: 1405 hours. Low volume film. The cardio pericardial silhouette is enlarged. The lungs are clear without focal pneumonia, edema, pneumothorax or pleural effusion. The visualized bony structures of the thorax are unremarkable. Telemetry leads overlie the chest. IMPRESSION: Low volume film without acute cardiopulmonary findings. Electronically Signed   By: Misty Stanley M.D.   On: 02/14/2022 14:21   ? ?Procedures ?Procedures  ? ?  CRITICAL CARE ?Performed by: Charlesetta Shanks ? ? ?Total critical care time: 45 minutes ? ?Critical care time was exclusive of separately billable procedures and treating other patients. ? ?Critical care was necessary to treat or prevent imminent or life-threatening deterioration. ? ?Critical care was time spent personally by me on the following activities: development of treatment plan with patient and/or surrogate as well as nursing, discussions with consultants, evaluation of patient's response to treatment, examination of patient, obtaining history from patient or surrogate, ordering and performing treatments and interventions, ordering and review of laboratory studies, ordering and review of radiographic studies, pulse oximetry and re-evaluation of patient's condition.  ?Medications Ordered in ED ?Medications  ?ipratropium-albuterol (DUONEB) 0.5-2.5 (3) MG/3ML nebulizer solution 3 mL (3 mLs Nebulization Given 02/14/22 1300)  ?naloxone [facility]) injection 1 mg (1 mg Intravenous Given 02/14/22 1639)  ?acetaminophen (TYLENOL)  tablet 1,000 mg (1,000 mg Oral Given 02/14/22 1738)  ? ? ?ED Course/ Medical Decision Making/ A&P ?  ?                        ?Medical Decision Making ?Amount and/or Complexity of Data Reviewed ?Labs: ordered. ?Radiology:

## 2022-02-14 NOTE — ED Triage Notes (Signed)
Patient reports to the ER for possible UTI, shortness of breath ?Patient reports she has been having episodes of incontinence and inability to make it to the bathroom with frequency  ?

## 2022-02-14 NOTE — Discharge Instructions (Signed)
1.  It is very important that you get treatment for opioid use.  It is dangerous to take medications different than prescribed or in combination with other prescribed medications.  This can result in severe oversedation and death.  You must get rid of all medications that are not active prescriptions and managed by your doctor. ?2.  You are leaving [facility].  Return immediately if there are any concerning changes. ?3.  You have been given a prescription for Narcan.  This is to be used immediately and a case of oversedation and difficulty breathing from narcotic use. ?

## 2022-02-14 NOTE — ED Notes (Signed)
Tammy [name] was started on 2L O2 via [place] by RT due to ABG results. ? ? ?I-stat ABG results: ?pH, Arterial 7.382 ?pCO2 arterial 51.1 mmHg ?pO2, Arterial 52 mmHg ?Bicarbonate 30.4 mmol/L ?TCO2 32 mmol/L ?O2 Saturation 86 % ?Acid-Base Excess 4.0 mmol/L ?Sodium 138 mmol/L ?Potassium 3.8 mmol/L ?Calcium, Ion 1.22 mmol/L ?HCT 44.0 % ?Hemoglobin 15.0 g/dL ?Patient temperature 36.8 C ?Collection site RADIAL, ALLEN'S TEST ACCEPTABLE ?Drawn by RT ?Sample type ARTERIAL ?

## 2022-03-01 ENCOUNTER — Other Ambulatory Visit: Payer: Self-pay | Admitting: Cardiology

## 2022-03-01 DIAGNOSIS — I480 Paroxysmal atrial fibrillation: Secondary | ICD-10-CM

## 2022-03-02 NOTE — Telephone Encounter (Signed)
Prescription refill request for Eliquis received. Indication: Afib  Last office visit:02/06/21 ([place])  Scr: 0.71 (02/14/22)  Age: 61 Weight: 123.4kg  Pt overdue for office visit with cardiologist. Message sent to schedulers. Appropriate dose and refill sent to requested pharmacy.

## 2022-03-08 ENCOUNTER — Encounter: Payer: Self-pay | Admitting: Cardiovascular Disease

## 2022-03-25 IMAGING — CR DG LUMBAR SPINE COMPLETE 4+V
5 series · 5 of 5 positions shown · non-contrast
Comparison: July 30, 2019

CLINICAL DATA: Back pain.

EXAM:
LUMBAR SPINE - COMPLETE 4+ VIEW

[t lumbar spine ap]
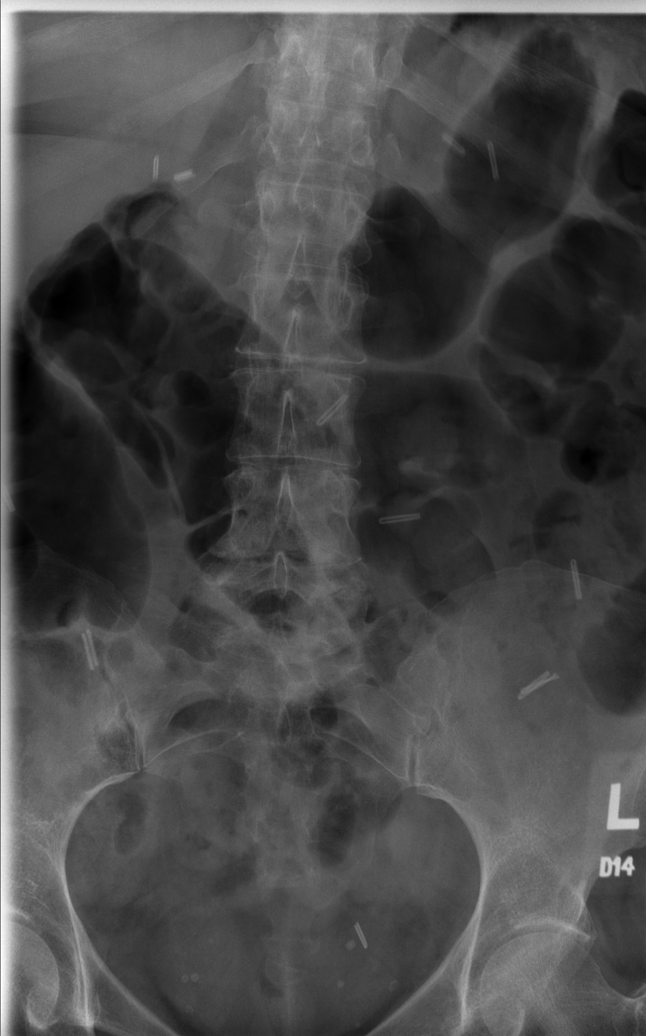

[t lumbar spine obl (1 of 2)]
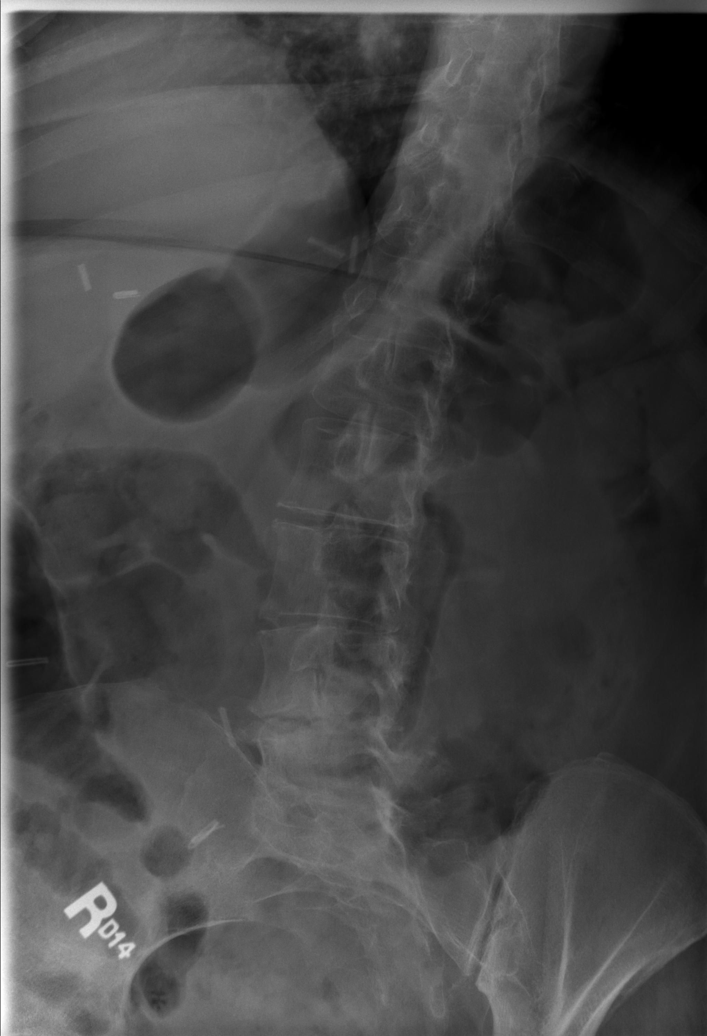

[t lumbar spine obl (2 of 2)]
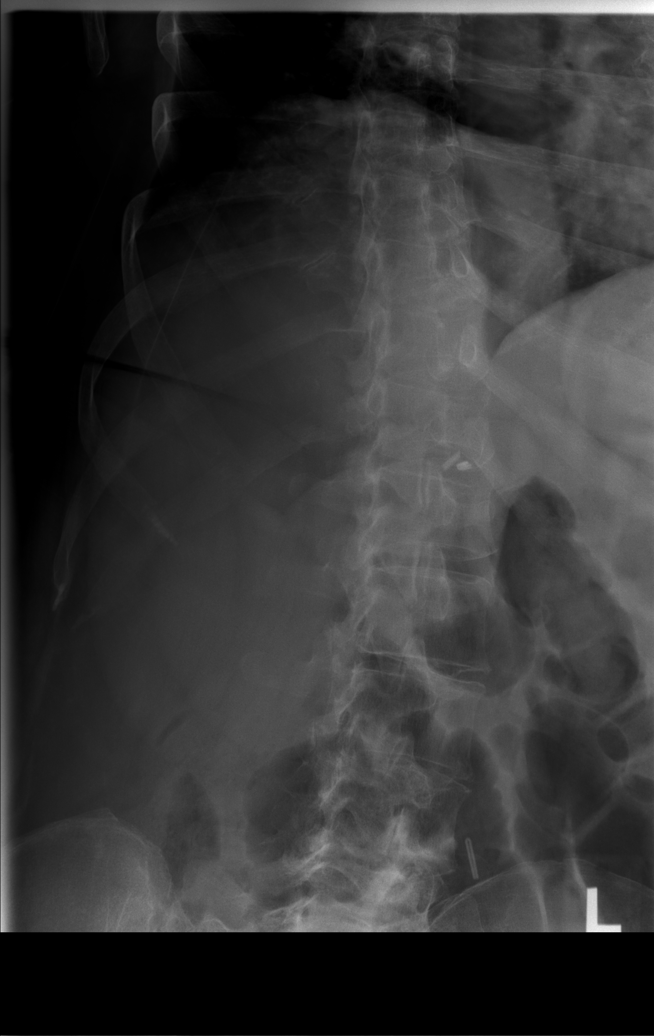

[t lumbar spine lat]
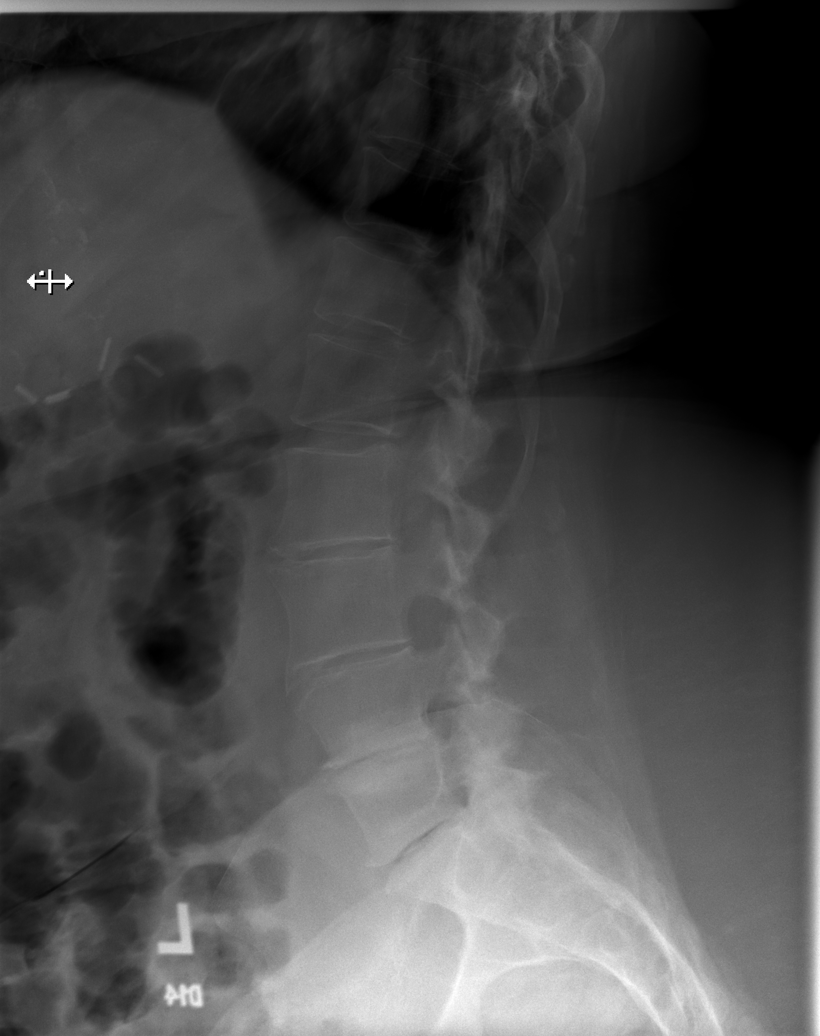

[t lumbar l-5 s-1 spot]
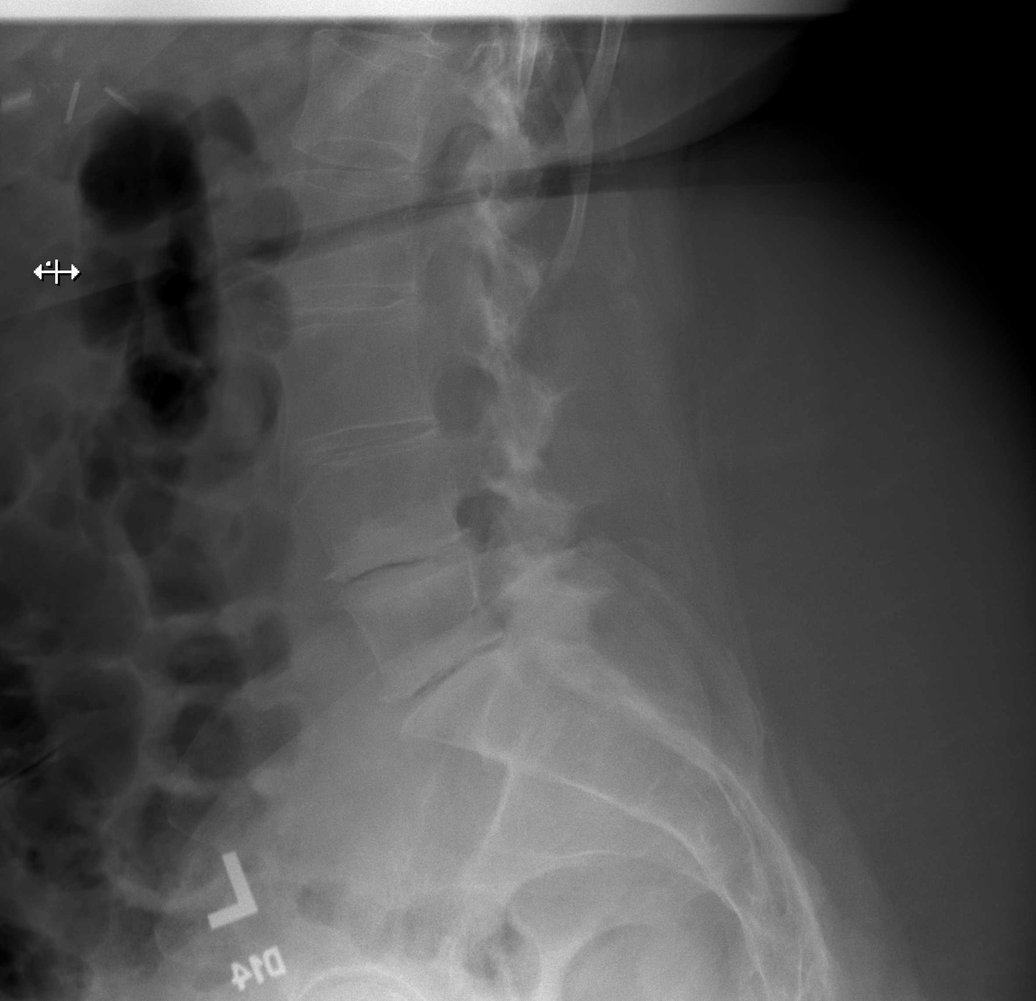

[5 of 5 positions shown; findings below may reference images not displayed]

FINDINGS: There is no evidence of lumbar spine fracture. Alignment is normal.
Moderate severity endplate sclerosis is seen at the levels of L4-L5
and L5-S1. Mild endplate sclerosis is seen throughout the remainder
of the lumbar spine. There is moderate to marked severity multilevel
intervertebral disc space narrowing. This is more prominent at the
level of L4-L5. Mild vacuum disc phenomenon is seen at the level of
L5-S1. Radiopaque surgical clips are seen throughout the abdomen and
pelvis.
IMPRESSION: 1. No acute findings.
2. Multilevel degenerative disc disease in the lumbar spine, most
prominent at L4-L5 and L5-S1.

## 2022-03-25 IMAGING — CT CT HEAD W/O CM
4 series · 17 of 47 positions shown, 19 images · non-contrast
Comparison: CT brain 02/20/2020, 10/27/2019, 07/30/2019

CLINICAL DATA: Altered mental status fell out of recliner

EXAM:
CT HEAD WITHOUT CONTRAST
TECHNIQUE: Contiguous axial images were obtained from the base of the skull
through the vertex without intravenous contrast.

[Series 2: head wo · axial · 0.44mm/px · z∈[+1484,+1604]mm · 7 of 33 slices shown, 9 images]
[im 5/33  brain]
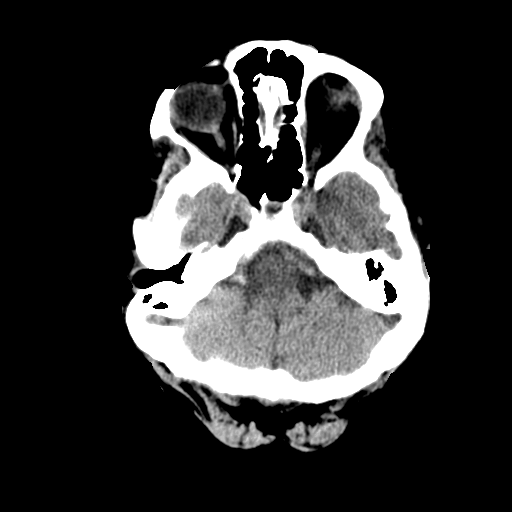
[im 5/33  bone]
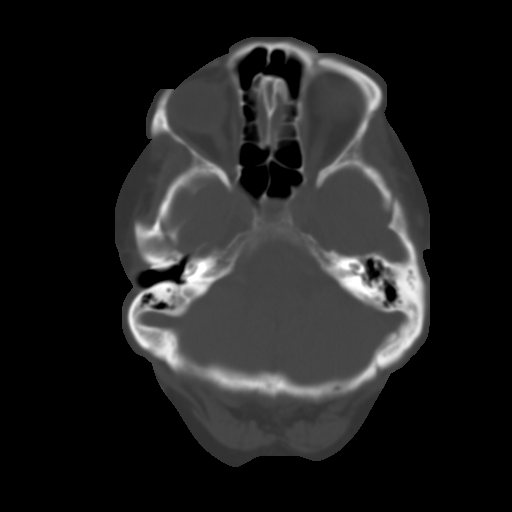
[im 9/33  brain]
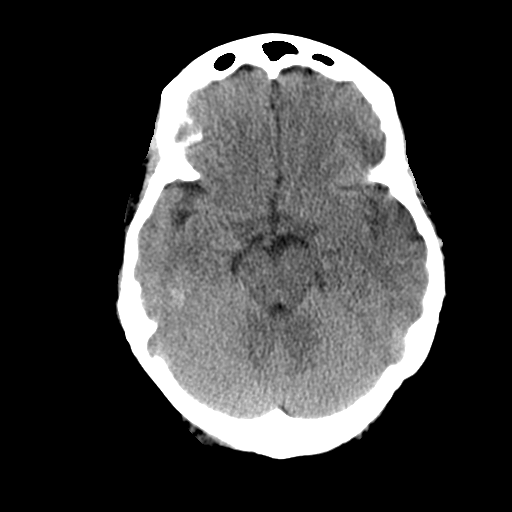
[im 13/33  brain]
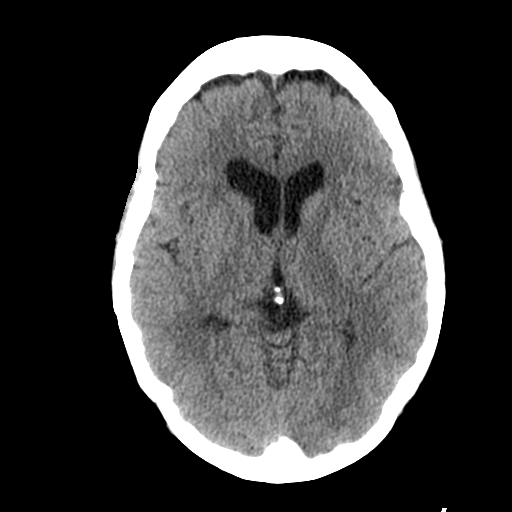
[im 17/33  brain]
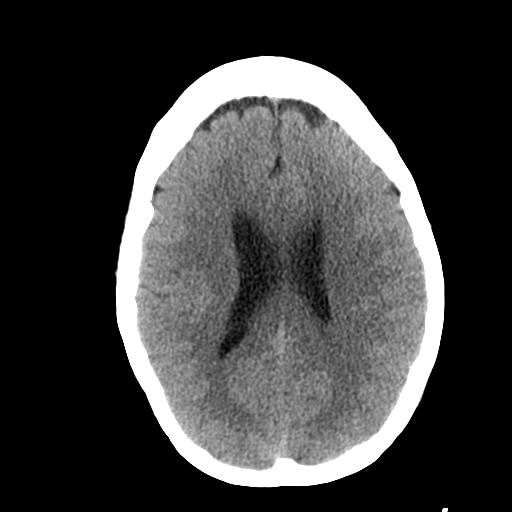
[im 21/33  brain]
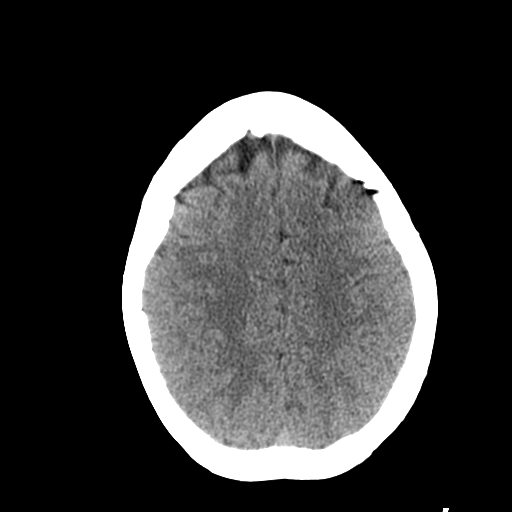
[im 21/33  bone]
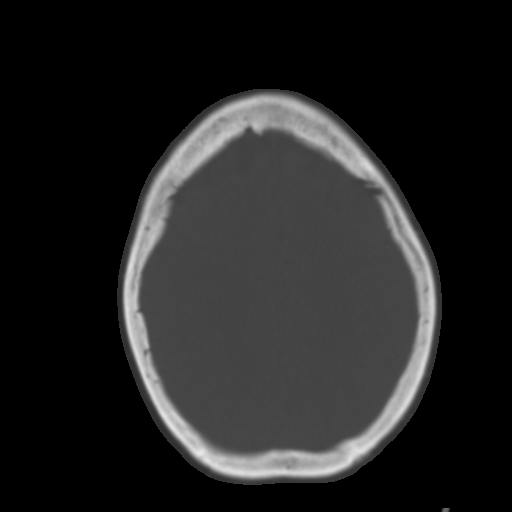
[im 25/33  brain]
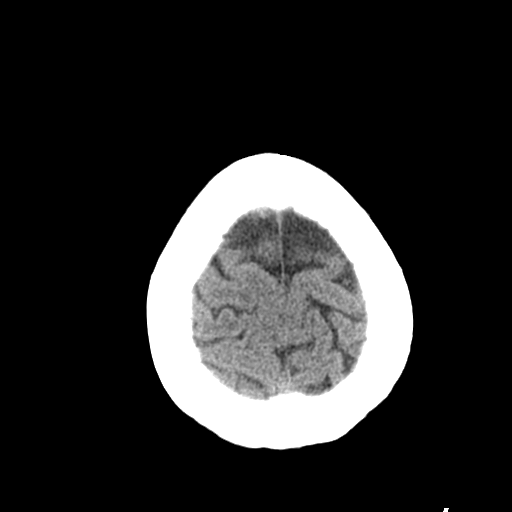
[im 29/33  brain]
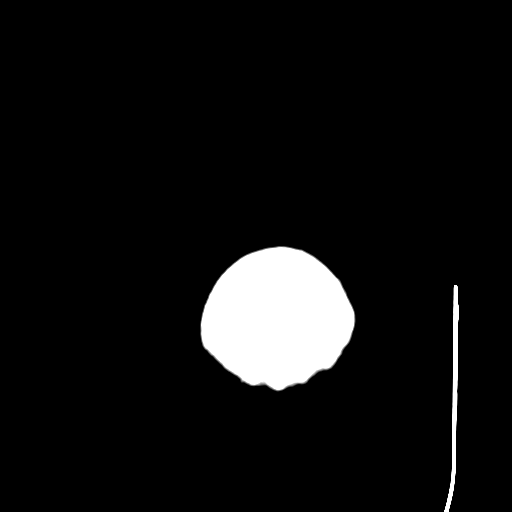

[Series 3: head bone · axial · 0.44mm/px · z∈[+1480,+1536]mm · 4 of 82 slices shown]
[im 9/82  bone]
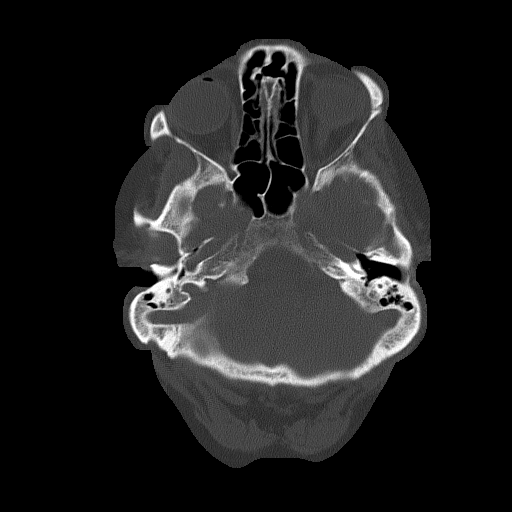
[im 17/82  bone]
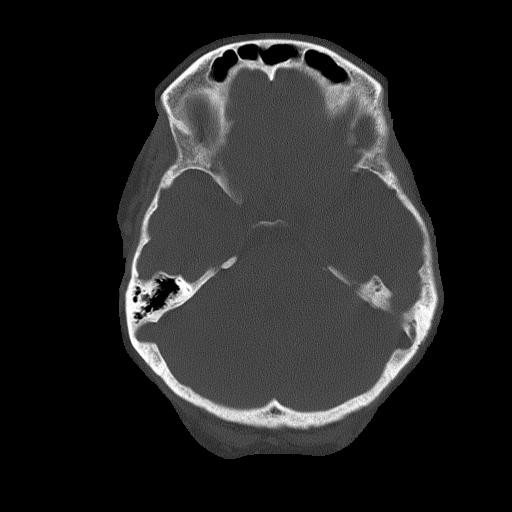
[im 25/82  bone]
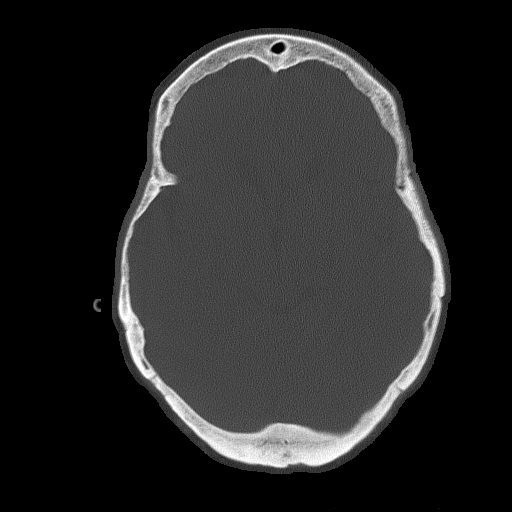
[im 37/82  bone]
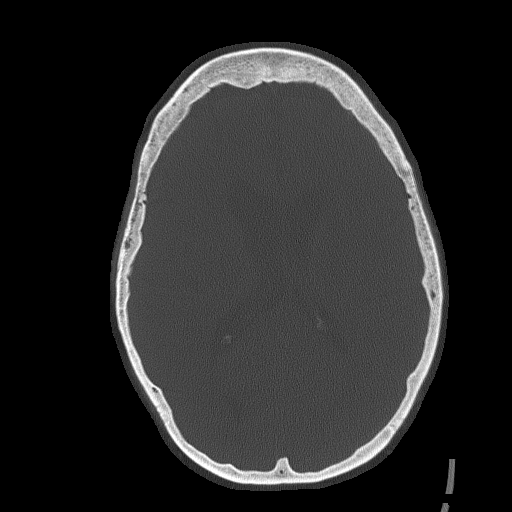

[Series 5: coronal soft tissue · coronal · 0.30mm/px · 3 of 67 slices shown]
[im 23/67  brain]
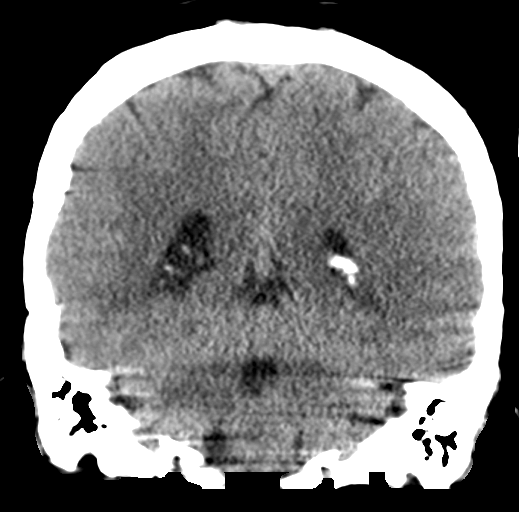
[im 30/67  brain]
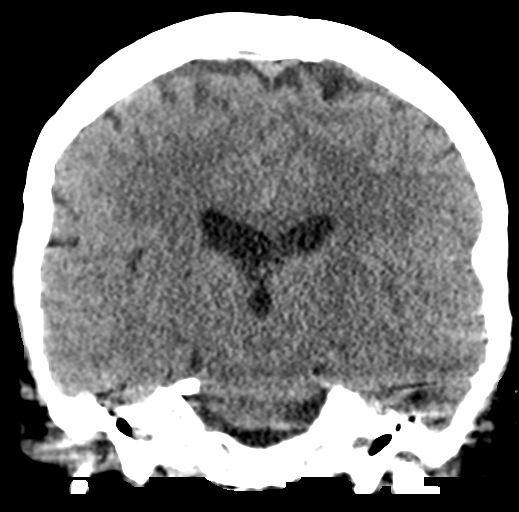
[im 37/67  brain]
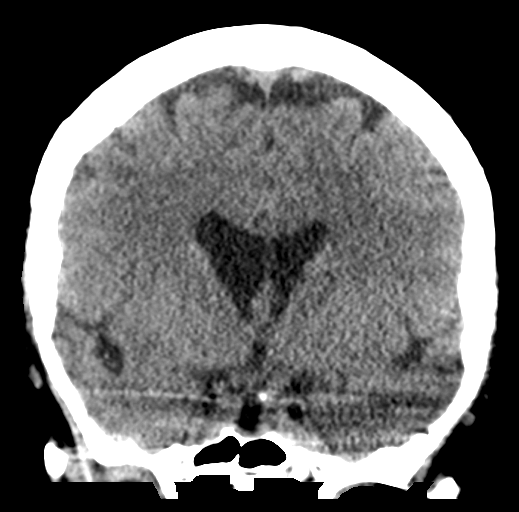

[Series 6: sagittal soft tissue · sagittal · 0.30mm/px · 3 of 52 slices shown]
[im 18/52  brain]
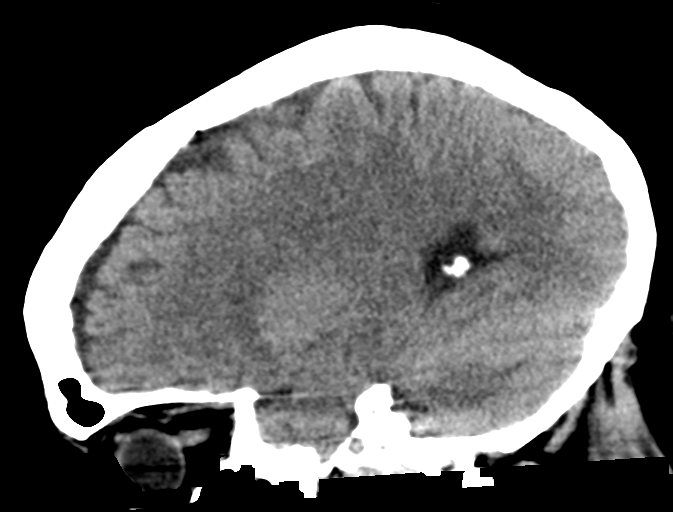
[im 26/52  brain]
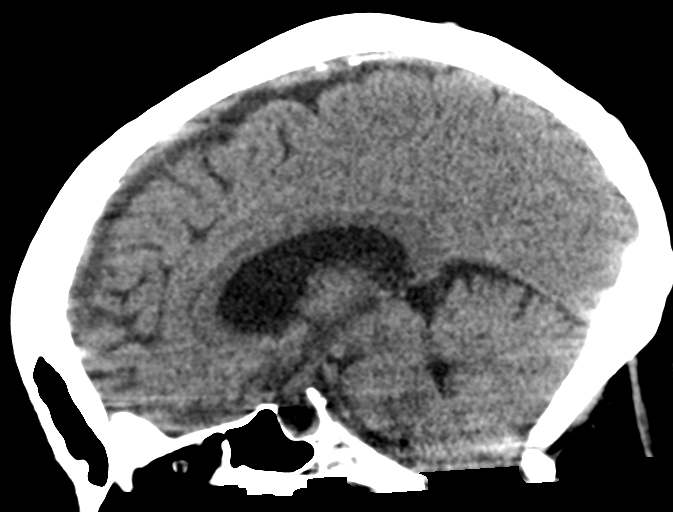
[im 35/52  brain]
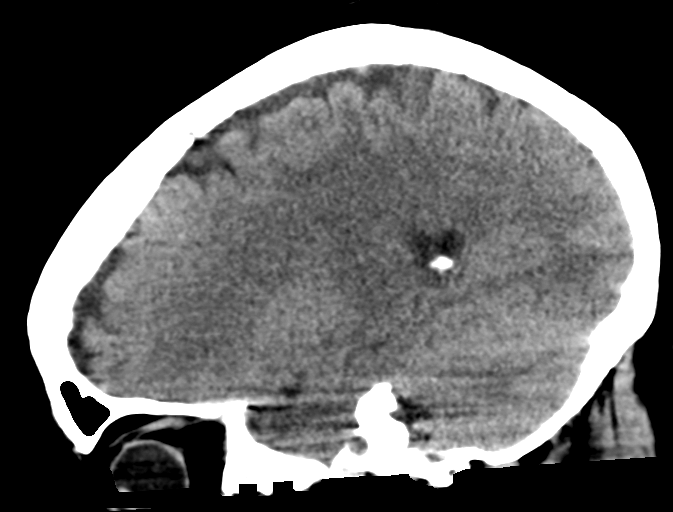

[17 of 47 positions shown; findings below may reference images not displayed]

FINDINGS: Brain: No evidence of acute infarction, hemorrhage, hydrocephalus,
extra-axial collection or mass lesion/mass effect.

Vascular: No hyperdense vessel or unexpected calcification.

Skull: Normal. Negative for fracture or focal lesion.

Sinuses/Orbits: No acute finding.

Other: None.
IMPRESSION: Negative non contrasted CT appearance of the brain.

## 2022-04-08 NOTE — Progress Notes (Signed)
Cardiology Office Note   Date:  04/09/2022   ID:  Tammy [name], DOB 1961-05-31, MRN [id number]  PCP:  Tammy Crook, MD  Cardiologist:   Tammy Breeding, MD   Chief Complaint  Patient presents with   Palpitations      History of Present Illness: Tammy [name] is a 61 y.o. female with a history of atrial fibrillation.  Her atrial fibrillation initially followed an episode of community-acquired pneumonia.  She was found to have atrial flutter on outpatient monitoring.  She has been diagnosed and treated for obstructive sleep apnea.  She is been managed with anticoagulation.   Since I last saw her she occasionally feels palpitations.  These are very brief.  She is not having any pain.  Having any chest pressure, neck or arm discomfort.  She has no shortness of breath, PND or orthopnea.  She has not been as active as I would like.  She has been tolerating her anticoagulation.  Past Medical History:  Diagnosis Date   Anemia    Anxiety    Arthritis    neck and lower back - deg disc disease, fingers   Atrial fibrillation and flutter (HCC)    Depression    Frequency of urination    GERD (gastroesophageal reflux disease)    History of seizure per pt no seizure since    11-07-2015  in setting of stress and sleep deprivation  /  negative work-up by neurologist unknown idiology   Injury of thumb, left    03-31-2016     Pelvic relaxation    SUI (stress urinary incontinence, female)     Past Surgical History:  Procedure Laterality Date   ANTERIOR AND POSTERIOR REPAIR  09/25/2012   Procedure: ANTERIOR (CYSTOCELE) AND POSTERIOR REPAIR (RECTOCELE);  Surgeon: Tammy Crocker, MD;  Location: [facility];  Service: Gynecology;  Laterality: N/A;   ANTERIOR AND POSTERIOR REPAIR WITH SACROSPINOUS FIXATION N/A 04/12/2016   Procedure: ANTERIOR AND POSTERIOR REPAIR WITH SACROSPINOUS LIGAMENT SUSPENSION, CYSTOSCOPY;  Surgeon: Tammy Nigh, MD;  Location: [facility];  Service:  Gynecology;  Laterality: N/A;   APPENDECTOMY  1987   BREAST EXCISIONAL BIOPSY Left    No scar visable    BREAST REDUCTION SURGERY  2000  aprrox   ENDOMETRIAL ABLATION W/ NOVASURE  2011   in office   EXCISION LEFT BREAST BX  12-07-1999   benign   LAPAROSCOPIC CHOLECYSTECTOMY  02-09-2005   LAPAROSCOPIC VAGINAL HYSTERECTOMY WITH SALPINGO OOPHORECTOMY Bilateral 04/12/2016   Procedure: LAPAROSCOPIC ASSISTED VAGINAL HYSTERECTOMY WITH SALPINGO OOPHORECTOMY;  Surgeon: Tammy Nigh, MD;  Location: [facility];  Service: Gynecology;  Laterality: Bilateral;   ORIF RADIAL FRACTURE Left 02/09/2021   Procedure: ORIF LEFT DISTAL RADIUS FRACTURE;  Surgeon: Tammy Presume, MD;  Location: [facility];  Service: Plastics;  Laterality: Left;  90 min   REDUCTION MAMMAPLASTY Bilateral    ROUX-EN-Y GASTRIC BYPASS  06/  2002   TUBAL LIGATION  10/ 1990     Current Outpatient Medications  Medication Sig Dispense Refill   apixaban (ELIQUIS) 5 MG TABS tablet TAKE ONE TABLET BY MOUTH TWICE A DAY 180 tablet 0   buPROPion (WELLBUTRIN XL) 150 MG 24 hr tablet Take 2 tablets (300 mg total) by mouth daily.     clonazePAM (KLONOPIN) 1 MG tablet Take 1 tablet (1 mg total) by mouth 2 (two) times daily. 30 tablet 0   FLUoxetine (PROZAC) 40 MG capsule Take 40 mg by mouth at bedtime.  gabapentin (NEURONTIN) 600 MG tablet Take 1.5 tablets (900 mg total) by mouth 3 (three) times daily. 540 tablet 1   Melatonin 10 MG TABS Take 10 mg by mouth at bedtime as needed (sleep).     metoprolol succinate (TOPROL-XL) 25 MG 24 hr tablet Take 1 tablet (25 mg total) by mouth at bedtime. 90 tablet 4   OLANZapine (ZYPREXA) 10 MG tablet Take 10 mg by mouth at bedtime.     potassium chloride SA (KLOR-CON M20) 20 MEQ tablet Take 1 tablet (20 mEq total) by mouth 2 (two) times daily. 180 tablet 1   No current facility-administered medications for this visit.    Allergies:   Nsaids, Adhesive [tape], Latex, and Sulfa antibiotics     ROS:  Please see the history of present illness.   Otherwise, review of systems are positive for none.   All other systems are reviewed and negative.    PHYSICAL EXAM: VS:  BP 128/69   Pulse 71   Ht '5\' 5"'$  (1.651 m)   Wt 275 lb 9.6 oz (125 kg)   LMP 09/30/2012   SpO2 97%   BMI 45.86 kg/m  , BMI Body mass index is 45.86 kg/m. GENERAL:  Well appearing NECK:  No jugular venous distention, waveform within normal limits, carotid upstroke brisk and symmetric, no bruits, no thyromegaly LUNGS:  Clear to auscultation bilaterally CHEST:  Unremarkable HEART:  PMI not displaced or sustained,S1 and S2 within normal limits, no S3, no S4, no clicks, no rubs, no murmurs ABD:  Flat, positive bowel sounds normal in frequency in pitch, no bruits, no rebound, no guarding, no midline pulsatile mass, no hepatomegaly, no splenomegaly EXT:  2 plus pulses throughout, no edema, no cyanosis no clubbing   EKG:  EKG is not ordered today.    Recent Labs: 09/17/2021: TSH 0.681 12/02/2021: Magnesium 2.2 02/14/2022: ALT 19; BUN 11; Creatinine, Ser 0.71; Hemoglobin 13.8; Platelets 199; Potassium 3.8; Sodium 139    Lipid Panel    Component Value Date/Time   CHOL 173 12/02/2021 1438   TRIG 80.0 12/02/2021 1438   HDL 69.40 12/02/2021 1438   CHOLHDL 2 12/02/2021 1438   VLDL 16.0 12/02/2021 1438   LDLCALC 88 12/02/2021 1438      Wt Readings from Last 3 Encounters:  04/09/22 275 lb 9.6 oz (125 kg)  01/20/22 272 lb (123.4 kg)  12/02/21 272 lb (123.4 kg)      Other studies Reviewed: Additional studies/ records that were reviewed today include:  None. Review of the above records demonstrates: See elsewhere  ASSESSMENT AND PLAN:   PAF/FLUTTER:    Ms. [name] has a CHA2DS2 - VASc score of 2.  She tolerates anticoagulation.  Her paroxysms are very infrequent and she is not having any sustained tachyarrhythmias.  No change in therapy.  SLEEP APNEA: She is unable to use CPAP.  She has tried  multiple masks.  HTN: Her blood pressure has transiently been elevated but it is well controlled today.  Of asked her to keep an eye on this at home.  She will continue the meds as listed.   Current medicines are reviewed at length with the patient today.  The patient does not have concerns regarding medicines.  The following changes have been made: None  Labs/ tests ordered today include:   No orders of the defined types were placed in this encounter.    Disposition:   FU with me 1 year   Signed, Tammy Breeding, MD  04/09/2022 3:34 PM    [facility]

## 2022-04-09 ENCOUNTER — Encounter: Payer: Self-pay | Admitting: Cardiology

## 2022-04-09 ENCOUNTER — Ambulatory Visit: Payer: 59 | Admitting: Cardiology

## 2022-04-09 VITALS — BP 128/69 | HR 71 | Ht 65.0 in | Wt 275.6 lb

## 2022-04-09 DIAGNOSIS — I480 Paroxysmal atrial fibrillation: Secondary | ICD-10-CM

## 2022-04-09 NOTE — Patient Instructions (Signed)

## 2022-04-29 ENCOUNTER — Emergency Department (HOSPITAL_BASED_OUTPATIENT_CLINIC_OR_DEPARTMENT_OTHER): Payer: 59

## 2022-04-29 ENCOUNTER — Emergency Department (HOSPITAL_BASED_OUTPATIENT_CLINIC_OR_DEPARTMENT_OTHER)
Admission: EM | Admit: 2022-04-29 | Discharge: 2022-04-29 | Disposition: A | Discharge status: Home / Self Care | Payer: 59 | Attending: Emergency Medicine | Admitting: Emergency Medicine

## 2022-04-29 ENCOUNTER — Other Ambulatory Visit: Payer: Self-pay

## 2022-04-29 ENCOUNTER — Encounter (HOSPITAL_BASED_OUTPATIENT_CLINIC_OR_DEPARTMENT_OTHER): Payer: Self-pay | Admitting: Emergency Medicine

## 2022-04-29 DIAGNOSIS — Z91040 Latex allergy status: Secondary | ICD-10-CM | POA: Diagnosis not present

## 2022-04-29 DIAGNOSIS — R109 Unspecified abdominal pain: Secondary | ICD-10-CM | POA: Diagnosis not present

## 2022-04-29 DIAGNOSIS — I4891 Unspecified atrial fibrillation: Secondary | ICD-10-CM | POA: Diagnosis not present

## 2022-04-29 DIAGNOSIS — R112 Nausea with vomiting, unspecified: Secondary | ICD-10-CM | POA: Insufficient documentation

## 2022-04-29 DIAGNOSIS — R197 Diarrhea, unspecified: Secondary | ICD-10-CM | POA: Diagnosis not present

## 2022-04-29 DIAGNOSIS — Z7901 Long term (current) use of anticoagulants: Secondary | ICD-10-CM | POA: Diagnosis not present

## 2022-04-29 DIAGNOSIS — Z79899 Other long term (current) drug therapy: Secondary | ICD-10-CM | POA: Diagnosis not present

## 2022-04-29 LAB — COMPREHENSIVE METABOLIC PANEL
ALT: 12 U/L (ref 0–44)
AST: 23 U/L (ref 15–41)
Albumin: 4.6 g/dL (ref 3.5–5.0)
Alkaline Phosphatase: 65 U/L (ref 38–126)
Anion gap: 15 (ref 5–15)
BUN: 12 mg/dL (ref 6–20)
CO2: 22 mmol/L (ref 22–32)
Calcium: 10.1 mg/dL (ref 8.9–10.3)
Chloride: 105 mmol/L (ref 98–111)
Creatinine, Ser: 0.77 mg/dL (ref 0.44–1.00)
GFR, Estimated: >60 mL/min (ref >60)
Glucose, Bld: 99 mg/dL (ref 70–99)
Potassium: 4 mmol/L (ref 3.5–5.1)
Sodium: 142 mmol/L (ref 135–145)
Total Bilirubin: 1 mg/dL (ref 0.3–1.2)
Total Protein: 7.2 g/dL (ref 6.5–8.1)

## 2022-04-29 LAB — URINALYSIS, ROUTINE W REFLEX MICROSCOPIC
Bilirubin Urine: NEGATIVE
Glucose, UA: NEGATIVE mg/dL
Ketones, ur: 40 mg/dL — AB
Leukocytes,Ua: NEGATIVE
Nitrite: POSITIVE — AB
Specific Gravity, Urine: >1.046 — ABNORMAL HIGH (ref 1.005–1.030)
pH: 5.5 (ref 5.0–8.0)

## 2022-04-29 LAB — CBC WITH DIFFERENTIAL/PLATELET
Abs Immature Granulocytes: 0.01 10*3/uL (ref 0.00–0.07)
Basophils Absolute: 0 10*3/uL (ref 0.0–0.1)
Basophils Relative: 0 %
Eosinophils Absolute: 0 10*3/uL (ref 0.0–0.5)
Eosinophils Relative: 0 %
HCT: 43.8 % (ref 36.0–46.0)
Hemoglobin: 14.4 g/dL (ref 12.0–15.0)
Immature Granulocytes: 0 %
Lymphocytes Relative: 18 %
Lymphs Abs: 1.6 10*3/uL (ref 0.7–4.0)
MCH: 29.2 pg (ref 26.0–34.0)
MCHC: 32.9 g/dL (ref 30.0–36.0)
MCV: 88.8 fL (ref 80.0–100.0)
Monocytes Absolute: 0.4 10*3/uL (ref 0.1–1.0)
Monocytes Relative: 5 %
Neutro Abs: 6.8 10*3/uL (ref 1.7–7.7)
Neutrophils Relative %: 77 %
Platelets: 248 10*3/uL (ref 150–400)
RBC: 4.93 MIL/uL (ref 3.87–5.11)
RDW: 14 % (ref 11.5–15.5)
WBC: 8.9 10*3/uL (ref 4.0–10.5)
nRBC: 0 % (ref 0.0–0.2)

## 2022-04-29 LAB — OCCULT BLOOD X 1 CARD TO LAB, STOOL: Fecal Occult Bld: NEGATIVE

## 2022-04-29 LAB — LIPASE, BLOOD: Lipase: <10 U/L — ABNORMAL LOW (ref 11–51)

## 2022-04-29 MED ORDER — FENTANYL CITRATE PF 50 MCG/ML IJ SOSY
50.0000 ug | PREFILLED_SYRINGE | Freq: Once | INTRAMUSCULAR | Status: AC
  Administered 2022-04-29: 50 ug via INTRAVENOUS
  Filled 2022-04-29: qty 1

## 2022-04-29 MED ORDER — CIPROFLOXACIN HCL 500 MG PO TABS: 500.0000 mg | ORAL_TABLET | Freq: Two times a day (BID) | ORAL | 0 refills | Status: DC

## 2022-04-29 MED ORDER — ONDANSETRON HCL 4 MG/2ML IJ SOLN
4.0000 mg | Freq: Once | INTRAMUSCULAR | Status: AC
Start: 2022-04-29 — End: 2022-04-29
  Administered 2022-04-29: 4 mg via INTRAVENOUS
  Filled 2022-04-29: qty 2

## 2022-04-29 MED ORDER — ONDANSETRON HCL 4 MG/2ML IJ SOLN
4.0000 mg | Freq: Once | INTRAMUSCULAR | Status: AC
  Administered 2022-04-29: 4 mg via INTRAVENOUS

## 2022-04-29 MED ORDER — SODIUM CHLORIDE 0.9 % IV BOLUS
1000.0000 mL | Freq: Once | INTRAVENOUS | Status: AC
  Administered 2022-04-29: 1000 mL via INTRAVENOUS

## 2022-04-29 MED ORDER — IOHEXOL 300 MG/ML  SOLN
100.0000 mL | Freq: Once | INTRAMUSCULAR | Status: AC | PRN
  Administered 2022-04-29: 100 mL via INTRAVENOUS

## 2022-04-29 MED ORDER — ONDANSETRON 4 MG PO TBDP: 4.0000 mg | ORAL_TABLET | Freq: Three times a day (TID) | ORAL | 0 refills | Status: DC | PRN

## 2022-04-29 NOTE — Discharge Instructions (Signed)
We suspect your illness is likely viral or possibly related to something you ate.  Your blood work and CT scan are reassuring.  Your urinalysis showed possible infection will be sent for culture.  You will be called if the antibiotic needs to be changed.  Stick with a clear liquid diet over the past 24 hours and advance slowly as tolerated.  Follow-up with your doctor.  Return to the ED with worsening pain, not able to eat or drink or any other concerns

## 2022-04-29 NOTE — ED Provider Notes (Signed)
[facility] EMERGENCY DEPT Provider Note   CSN: [id number] Arrival date & time: 04/29/22  0708     History  Chief Complaint  Patient presents with   Emesis    Tammy [name] is a 61 y.o. female.  Patient with a history of acid reflux disease, atrial fibrillation on Eliquis, anxiety and depression presenting with a 2-day history of vomiting, abdominal pain and diarrhea.  States she had multiple episodes of loose stools throughout the day on July 25 but that seemed to subside.  A couple times she saw some blood with wiping but the stool itself was brown.  She had vomiting all day yesterday and overnight approximately 6 or 7 times.  Unable to keep anything down.  No fever.  Having upper abdominal pain that is constant and unable to tolerate p.o. at home.  No travel or sick contacts.  No recent antibiotic use.  Previous gastric bypass, appendectomy, cholecystectomy and hysterectomy. Denies any chest pain or shortness of breath   Emesis Associated symptoms: abdominal pain and diarrhea        Home Medications Prior to Admission medications   Medication Sig Start Date End Date Taking? Authorizing Provider  apixaban (ELIQUIS) 5 MG TABS tablet TAKE ONE TABLET BY MOUTH TWICE A DAY 03/02/22   Minus Breeding, MD  buPROPion (WELLBUTRIN XL) 150 MG 24 hr tablet Take 2 tablets (300 mg total) by mouth daily. 10/25/21   Nita Sells, MD  clonazePAM (KLONOPIN) 1 MG tablet Take 1 tablet (1 mg total) by mouth 2 (two) times daily. 10/25/21   Nita Sells, MD  FLUoxetine (PROZAC) 40 MG capsule Take 40 mg by mouth at bedtime. 06/23/21   [provider]  gabapentin (NEURONTIN) 600 MG tablet Take 1.5 tablets (900 mg total) by mouth 3 (three) times daily. 11/04/21   Tawnya Crook, MD  Melatonin 10 MG TABS Take 10 mg by mouth at bedtime as needed (sleep).    [provider]  metoprolol succinate (TOPROL-XL) 25 MG 24 hr tablet Take 1 tablet (25 mg total) by mouth  at bedtime. 03/27/21   Minus Breeding, MD  OLANZapine (ZYPREXA) 10 MG tablet Take 10 mg by mouth at bedtime. 04/04/19   [provider]  potassium chloride SA (KLOR-CON M20) 20 MEQ tablet Take 1 tablet (20 mEq total) by mouth 2 (two) times daily. 02/03/22   Minus Breeding, MD      Allergies    Nsaids, Adhesive [tape], Latex, and Sulfa antibiotics    Review of Systems   Review of Systems  Gastrointestinal:  Positive for abdominal pain, diarrhea, nausea and vomiting.   all other systems are negative except as noted in the HPI and PMH.    Physical Exam Updated Vital Signs BP (!) 146/99   Pulse (!) 59   Temp 98.5 F (36.9 C) (Oral)   Resp 18   Ht '5\' 5"'$  (1.651 m)   Wt 120.2 kg   LMP 09/30/2012   SpO2 98%   BMI 44.10 kg/m  Physical Exam Vitals and nursing note reviewed.  Constitutional:      General: She is not in acute distress.    Appearance: She is well-developed.  HENT:     Head: Normocephalic and atraumatic.     Mouth/Throat:     Pharynx: No oropharyngeal exudate.  Eyes:     Conjunctiva/sclera: Conjunctivae normal.     Pupils: Pupils are equal, round, and reactive to light.  Neck:     Comments: No meningismus. Cardiovascular:  Rate and Rhythm: Normal rate and regular rhythm.     Heart sounds: Normal heart sounds. No murmur heard. Pulmonary:     Effort: Pulmonary effort is normal. No respiratory distress.     Breath sounds: Normal breath sounds.  Abdominal:     Palpations: Abdomen is soft.     Tenderness: There is abdominal tenderness. There is no guarding or rebound.     Comments: Epigastric tender  Genitourinary:    Comments: Small external hemorrhoid, no gross blood, no melena.  Charlett Nose NT chaperone Musculoskeletal:        General: No tenderness. Normal range of motion.     Cervical back: Normal range of motion and neck supple.  Skin:    General: Skin is warm.  Neurological:     Mental Status: She is alert and oriented to person, place, and time.      Cranial Nerves: No cranial nerve deficit.     Motor: No abnormal muscle tone.     Coordination: Coordination normal.     Comments:  5/5 strength throughout. CN 2-12 intact.Equal grip strength.   Psychiatric:        Behavior: Behavior normal.     ED Results / Procedures / Treatments   Labs (all labs ordered are listed, but only abnormal results are displayed) Labs Reviewed  LIPASE, BLOOD - Abnormal; Notable for the following components:      Result Value   Lipase <10 (*)    All other components within normal limits  URINALYSIS, ROUTINE W REFLEX MICROSCOPIC - Abnormal; Notable for the following components:   Specific Gravity, Urine >1.046 (*)    Hgb urine dipstick LARGE (*)    Ketones, ur 40 (*)    Protein, ur TRACE (*)    Nitrite POSITIVE (*)    Bacteria, UA RARE (*)    All other components within normal limits  URINE CULTURE  CBC WITH DIFFERENTIAL/PLATELET  COMPREHENSIVE METABOLIC PANEL  OCCULT BLOOD X 1 CARD TO LAB, STOOL    EKG None  Radiology CT ABDOMEN PELVIS W CONTRAST  Result Date: 04/29/2022 CLINICAL DATA:  61 year old female with history of acute onset of nonlocalized abdominal pain with nausea, vomiting and diarrhea for the past 4 days. EXAM: CT ABDOMEN AND PELVIS WITH CONTRAST TECHNIQUE: Multidetector CT imaging of the abdomen and pelvis was performed using the standard protocol following bolus administration of intravenous contrast. RADIATION DOSE REDUCTION: This exam was performed according to the departmental dose-optimization program which includes automated exposure control, adjustment of the mA and/or kV according to patient size and/or use of iterative reconstruction technique. CONTRAST:  150m OMNIPAQUE IOHEXOL 300 MG/ML  SOLN COMPARISON:  CT the chest, abdomen and pelvis 10/13/2021. FINDINGS: Lower chest: Unremarkable. Hepatobiliary: Subtle amorphous area of low attenuation adjacent to the falciform ligament in segment 4B, favored to represent focal fatty  infiltration or a benign perfusion anomaly. No suspicious cystic or solid hepatic lesions are otherwise noted. Minimal intrahepatic biliary ductal dilatation, stable. Status post cholecystectomy. Common bile duct measures up to 8 mm in the porta hepatis, likely reflective of benign post cholecystectomy physiology. Pancreas: No pancreatic mass. No pancreatic ductal dilatation. No pancreatic or peripancreatic fluid collections or inflammatory changes. Spleen: Unremarkable. Adrenals/Urinary Tract: Bilateral kidneys and bilateral adrenal glands are normal in appearance. No hydroureteronephrosis. Urinary bladder is normal in appearance. Stomach/Bowel: Postoperative changes of Roux-en-Y gastric bypass are noted. No pathologic dilatation of small bowel or colon. A few scattered colonic diverticulae are noted, without definite focal surrounding inflammatory changes  to indicate an acute diverticulitis at this time. The appendix is not confidently identified and may be surgically absent. Regardless, there are no inflammatory changes noted adjacent to the cecum to suggest the presence of an acute appendicitis at this time. Vascular/Lymphatic: No significant atherosclerotic disease, aneurysm or dissection noted in the abdominal or pelvic vasculature. No lymphadenopathy noted in the abdomen or pelvis. Reproductive: Status post hysterectomy. Ovaries are not confidently identified may be surgically absent or atrophic. Other: No significant volume of ascites. No pneumoperitoneum. Musculoskeletal: There are no aggressive appearing lytic or blastic lesions noted in the visualized portions of the skeleton. IMPRESSION: 1. No acute findings are noted in the abdomen or pelvis to account for the patient's symptoms. 2. Colonic diverticulosis without evidence of acute diverticulitis at this time. 3. Postoperative changes and additional incidental findings, as above. Electronically Signed   By: Vinnie Langton M.D.   On: 04/29/2022 09:10     Procedures Procedures    Medications Ordered in ED Medications  sodium chloride 0.9 % bolus 1,000 mL (has no administration in time range)  ondansetron (ZOFRAN) injection 4 mg (has no administration in time range)    ED Course/ Medical Decision Making/ A&P                           Medical Decision Making Amount and/or Complexity of Data Reviewed Labs: ordered. Decision-making details documented in ED Course. Radiology: ordered and independent interpretation performed. Decision-making details documented in ED Course. ECG/medicine tests: ordered and independent interpretation performed. Decision-making details documented in ED Course.  Risk Prescription drug management.   2-3 days of nausea vomiting diarrhea.  Vital stable, no distress.  Abdomen soft without peritoneal signs.  Patient given IV fluids and labs obtained.  Did see some blood in her stool but believes this was after wiping and externally.  Low suspicion for active GI bleed but she does take Eliquis.  Hemoccult will be performed  Hemoccult is negative.  Hemoglobin is stable.  Low suspicion for significant GI bleed.  Labs appear to be at baseline.  Abdomen soft without peritoneal signs.  No vomiting noted throughout ED stay.  Given her history of gastric bypass, CT scan is obtained.  CT scan is reassuring.  No evidence of small bowel obstruction or other acute surgical pathology.  Diverticulosis without evidence of diverticulitis. Nitrate positive urinalysis noted.  However she seems to have nitrate positive urinalysis quite frequently.  Patient with no urinary symptoms.  Will send for culture.  In January she did grow E. coli that was resistant to multiple antibiotics.  Given her allergy to sulfa antibiotics will prescribe short course of Cipro while culture is pending.  Patient tolerating p.o.  Abdomen remains soft.  No vomiting in the ED.  Suspect likely viral gastroenteritis versus foodborne illness.  We will treat  supportively with antiemetics, bland diet, clear liquids, advance as tolerated, PCP follow-up. Return to the ED with worsening pain, fever, vomiting, not able to eat or drink or other concerns.        Final Clinical Impression(s) / ED Diagnoses Final diagnoses:  Nausea vomiting and diarrhea    Rx / DC Orders ED Discharge Orders     None         Ravenne Wayment, Annie Main, MD 04/29/22 1104

## 2022-04-29 NOTE — ED Triage Notes (Signed)
Pt here from home with c/o n/v/d since Tuesday , has tried some zofran without relief

## 2022-05-01 LAB — URINE CULTURE: Culture: >=100000 — AB

## 2022-05-02 ENCOUNTER — Telehealth (HOSPITAL_BASED_OUTPATIENT_CLINIC_OR_DEPARTMENT_OTHER): Payer: Self-pay | Admitting: *Deleted

## 2022-05-02 NOTE — Telephone Encounter (Signed)
Post ED Visit - Positive Culture Follow-up  Culture report reviewed by antimicrobial stewardship pharmacist: [place] Team '[]'$  Elenor Quinones, Pharm.D. '[]'$  Heide Guile, Pharm.D., BCPS AQ-ID '[]'$  Parks Neptune, Pharm.D., BCPS '[]'$  Alycia Rossetti, Pharm.D., BCPS '[]'$  [place], Pharm.D., BCPS, AAHIVP '[]'$  Legrand Como, Pharm.D., BCPS, AAHIVP '[]'$  Salome Arnt, PharmD, BCPS '[]'$  Johnnette Gourd, PharmD, BCPS '[]'$  Hughes Better, PharmD, BCPS '[]'$  Leeroy Cha, PharmD '[]'$  Laqueta Linden, PharmD, BCPS '[x]'$  Lorelei Pont, PharmD  [place] Team '[]'$  Leodis Sias, PharmD '[]'$  Lindell Spar, PharmD '[]'$  Royetta Asal, PharmD '[]'$  Graylin Shiver, Rph '[]'$  Rema Fendt) Glennon Mac, PharmD '[]'$  Arlyn Dunning, PharmD '[]'$  Netta Cedars, PharmD '[]'$  Dia Sitter, PharmD '[]'$  Leone Haven, PharmD '[]'$  Gretta Arab, PharmD '[]'$  Theodis Shove, PharmD '[]'$  Peggyann Juba, PharmD '[]'$  Reuel Boom, PharmD   Positive urine culture Treated with Ciprofloxacin, organism sensitive to the same and no further patient follow-up is required at this time.  Rosie Fate 05/02/2022, 12:29 PM

## 2022-05-05 ENCOUNTER — Telehealth (INDEPENDENT_AMBULATORY_CARE_PROVIDER_SITE_OTHER): Payer: 59 | Admitting: Family Medicine

## 2022-05-05 ENCOUNTER — Encounter: Payer: Self-pay | Admitting: Family Medicine

## 2022-05-05 DIAGNOSIS — N3000 Acute cystitis without hematuria: Secondary | ICD-10-CM

## 2022-05-05 DIAGNOSIS — R110 Nausea: Secondary | ICD-10-CM

## 2022-05-05 DIAGNOSIS — R197 Diarrhea, unspecified: Secondary | ICD-10-CM | POA: Diagnosis not present

## 2022-05-05 DIAGNOSIS — R109 Unspecified abdominal pain: Secondary | ICD-10-CM

## 2022-05-05 MED ORDER — DICYCLOMINE HCL 10 MG PO CAPS
10.0000 mg | ORAL_CAPSULE | Freq: Three times a day (TID) | ORAL | 0 refills | Status: DC
Start: 2022-05-05 — End: 2022-06-02

## 2022-05-05 MED ORDER — CIPROFLOXACIN HCL 500 MG PO TABS
500.0000 mg | ORAL_TABLET | Freq: Two times a day (BID) | ORAL | 0 refills | Status: DC
Start: 2022-05-05 — End: 2022-06-02

## 2022-05-05 MED ORDER — PROMETHAZINE HCL 25 MG PO TABS: 25.0000 mg | ORAL_TABLET | Freq: Three times a day (TID) | ORAL | 0 refills | Status: DC | PRN

## 2022-05-05 MED ORDER — DIPHENOXYLATE-ATROPINE 2.5-0.025 MG PO TABS: 1.0000 | ORAL_TABLET | Freq: Four times a day (QID) | ORAL | 0 refills | Status: DC | PRN

## 2022-05-05 NOTE — Progress Notes (Signed)
MyChart Video Visit    Virtual Visit via Video Note   This visit type was conducted due to national recommendations for restrictions regarding the COVID-19 Pandemic (e.g. social distancing) in an effort to limit this patient's exposure and mitigate transmission in our community. This patient is at least at moderate risk for complications without adequate follow up. This format is felt to be most appropriate for this patient at this time. Physical exam was limited by quality of the video and audio technology used for the visit. CMA was able to get the patient set up on a video visit.  Patient location: Home. Patient and provider in visit Provider location: Office  I discussed the limitations of evaluation and management by telemedicine and the availability of in person appointments. The patient expressed understanding and agreed to proceed.  Visit Date: 05/05/2022  Today's healthcare provider: Wellington Hampshire, MD     Subjective:    Patient ID: Tammy [name], female    DOB: 03-Nov-1960, 61 y.o.   MRN: [id number]  Chief Complaint  Patient presents with   Diarrhea   Nausea    Sx started last Tuesday, went to ER on Thursday, 04/29/2022   Chills   Generalized Body Aches    HPI Abd pain/n/v/ since 1 wk ago.  Went to ER on 7/27-CT neg, labs ok.  Got IVF.  Cipro 3 days for uti.  Still felt bad, then diarrhea starte on 7/28.  Still w/nausea, no vomiting since ER.    Diarrhea 6-7x/d-watery.   No fever.  Still w/chills and abd cramping.  No blood.  No others ill.  No travel.  Has well water-but doesn't drink it Has lost 17# in 1 wk.  Able to drink broth and fluids.  Immodium not helping and zofran not helping  Past Medical History:  Diagnosis Date   Anemia    Anxiety    Arthritis    neck and lower back - deg disc disease, fingers   Atrial fibrillation and flutter (HCC)    Depression    Frequency of urination    GERD (gastroesophageal reflux disease)    History of seizure per pt no  seizure since    11-07-2015  in setting of stress and sleep deprivation  /  negative work-up by neurologist unknown idiology   Injury of thumb, left    03-31-2016     Pelvic relaxation    SUI (stress urinary incontinence, female)     Past Surgical History:  Procedure Laterality Date   ANTERIOR AND POSTERIOR REPAIR  09/25/2012   Procedure: ANTERIOR (CYSTOCELE) AND POSTERIOR REPAIR (RECTOCELE);  Surgeon: Lahoma Crocker, MD;  Location: [facility];  Service: Gynecology;  Laterality: N/A;   ANTERIOR AND POSTERIOR REPAIR WITH SACROSPINOUS FIXATION N/A 04/12/2016   Procedure: ANTERIOR AND POSTERIOR REPAIR WITH SACROSPINOUS LIGAMENT SUSPENSION, CYSTOSCOPY;  Surgeon: Arvella Nigh, MD;  Location: [facility];  Service: Gynecology;  Laterality: N/A;   APPENDECTOMY  1987   BREAST EXCISIONAL BIOPSY Left    No scar visable    BREAST REDUCTION SURGERY  2000  aprrox   ENDOMETRIAL ABLATION W/ NOVASURE  2011   in office   EXCISION LEFT BREAST BX  12-07-1999   benign   LAPAROSCOPIC CHOLECYSTECTOMY  02-09-2005   LAPAROSCOPIC VAGINAL HYSTERECTOMY WITH SALPINGO OOPHORECTOMY Bilateral 04/12/2016   Procedure: LAPAROSCOPIC ASSISTED VAGINAL HYSTERECTOMY WITH SALPINGO OOPHORECTOMY;  Surgeon: Arvella Nigh, MD;  Location: [facility];  Service: Gynecology;  Laterality: Bilateral;   ORIF  RADIAL FRACTURE Left 02/09/2021   Procedure: ORIF LEFT DISTAL RADIUS FRACTURE;  Surgeon: Cindra Presume, MD;  Location: [facility];  Service: Plastics;  Laterality: Left;  90 min   REDUCTION MAMMAPLASTY Bilateral    ROUX-EN-Y GASTRIC BYPASS  06/  2002   TUBAL LIGATION  10/ 1990    Outpatient Medications Prior to Visit  Medication Sig Dispense Refill   apixaban (ELIQUIS) 5 MG TABS tablet TAKE ONE TABLET BY MOUTH TWICE A DAY 180 tablet 0   buPROPion (WELLBUTRIN XL) 150 MG 24 hr tablet Take 2 tablets (300 mg total) by mouth daily.     clonazePAM (KLONOPIN) 1 MG tablet Take 1 tablet (1 mg total) by mouth 2 (two)  times daily. 30 tablet 0   FLUoxetine (PROZAC) 40 MG capsule Take 40 mg by mouth at bedtime.     gabapentin (NEURONTIN) 600 MG tablet Take 1.5 tablets (900 mg total) by mouth 3 (three) times daily. 540 tablet 1   Melatonin 10 MG TABS Take 10 mg by mouth at bedtime as needed (sleep).     metoprolol succinate (TOPROL-XL) 25 MG 24 hr tablet Take 1 tablet (25 mg total) by mouth at bedtime. 90 tablet 4   OLANZapine (ZYPREXA) 10 MG tablet Take 10 mg by mouth at bedtime.     ondansetron (ZOFRAN-ODT) 4 MG disintegrating tablet Take 1 tablet (4 mg total) by mouth every 8 (eight) hours as needed for nausea or vomiting. 20 tablet 0   potassium chloride SA (KLOR-CON M20) 20 MEQ tablet Take 1 tablet (20 mEq total) by mouth 2 (two) times daily. 180 tablet 1   ciprofloxacin (CIPRO) 500 MG tablet Take 1 tablet (500 mg total) by mouth every 12 (twelve) hours. (Patient not taking: Reported on 05/05/2022) 10 tablet 0   No facility-administered medications prior to visit.    Allergies  Allergen Reactions   Nsaids Other (See Comments)    Post Gastric Bypass   Adhesive [Tape] Itching and Rash   Latex Itching, Rash and Other (See Comments)   Sulfa Antibiotics Itching and Rash        Objective:     Physical Exam  Vitals and nursing note reviewed.  Constitutional:      General:  in bed-tired appearing.     Appearance: Normal appearance.  HENT:     Head: Normocephalic.  Pulmonary:     Effort: No respiratory distress.  Skin:    General: Skin is dry.     Coloration: Skin is not pale.  Neurological:     Mental Status: Pt is alert and oriented to person, place, and time.  Psychiatric:        Mood and Affect: Mood normal.   LMP 09/30/2012   Wt Readings from Last 3 Encounters:  04/29/22 265 lb (120.2 kg)  04/09/22 275 lb 9.6 oz (125 kg)  01/20/22 272 lb (123.4 kg)   Reviewed ER notes/labs.  Klebsiella uti sens cipro    Assessment & Plan:   Problem List Items Addressed This Visit   None Visit  Diagnoses     Nausea    -  Primary   Diarrhea, unspecified type       Acute cystitis without hematuria       Abdominal cramps          Nausea-vomiting better, but nausea continues.  CT neg in ER.   Zofran not working.  Change to phenergan '25mg'$ ..continue fluids.  ER if worse, dehydration, weaker Diarrhea-?viral, other.  Cautiously, lomotil-warned  not to overdo it![facility]  PDMP checked. Abd cramps-bentyl '10mg'$  qid prn.   UTI-Klebsiella-sens cipro-not sure if 5 days was enough  pt still sick.  Will do 5 days more cipro '500mg'$  bid. Try to push fluids/electrolytes.  Worse, weaker, etc-to ER.   Meds ordered this encounter  Medications   ciprofloxacin (CIPRO) 500 MG tablet    Sig: Take 1 tablet (500 mg total) by mouth every 12 (twelve) hours.    Dispense:  10 tablet    Refill:  0   promethazine (PHENERGAN) 25 MG tablet    Sig: Take 1 tablet (25 mg total) by mouth every 8 (eight) hours as needed for nausea or vomiting.    Dispense:  20 tablet    Refill:  0   dicyclomine (BENTYL) 10 MG capsule    Sig: Take 1 capsule (10 mg total) by mouth 4 (four) times daily -  before meals and at bedtime.    Dispense:  20 capsule    Refill:  0   diphenoxylate-atropine (LOMOTIL) 2.5-0.025 MG tablet    Sig: Take 1 tablet by mouth 4 (four) times daily as needed for diarrhea or loose stools.    Dispense:  10 tablet    Refill:  0    I discussed the assessment and treatment plan with the patient. The patient was provided an opportunity to ask questions and all were answered. The patient agreed with the plan and demonstrated an understanding of the instructions.   The patient was advised to call back or seek an in-person evaluation if the symptoms worsen or if the condition fails to improve as anticipated.  I provided 25 minutes of face-to-face time during this encounter.   Wellington Hampshire, MD [facility] [phone] (phone) [phone] (fax)  [place]

## 2022-05-05 NOTE — Patient Instructions (Signed)
Meds sent to pharmacy  Drink sips of fluids frequently.  Try pedialyte,liquid IV replacement,electrolytes  Worse, weaker, dizzy, etc-go to Emergency room.

## 2022-05-12 ENCOUNTER — Emergency Department (HOSPITAL_BASED_OUTPATIENT_CLINIC_OR_DEPARTMENT_OTHER)
Admission: EM | Admit: 2022-05-12 | Discharge: 2022-05-13 | Disposition: A | Discharge status: Home / Self Care | Payer: 59 | Attending: Emergency Medicine | Admitting: Emergency Medicine

## 2022-05-12 ENCOUNTER — Emergency Department (HOSPITAL_BASED_OUTPATIENT_CLINIC_OR_DEPARTMENT_OTHER): Payer: 59 | Admitting: Radiology

## 2022-05-12 ENCOUNTER — Encounter (HOSPITAL_BASED_OUTPATIENT_CLINIC_OR_DEPARTMENT_OTHER): Payer: Self-pay

## 2022-05-12 DIAGNOSIS — M7989 Other specified soft tissue disorders: Secondary | ICD-10-CM | POA: Diagnosis not present

## 2022-05-12 DIAGNOSIS — W541XXA Struck by dog, initial encounter: Secondary | ICD-10-CM | POA: Diagnosis not present

## 2022-05-12 DIAGNOSIS — Z7901 Long term (current) use of anticoagulants: Secondary | ICD-10-CM | POA: Diagnosis not present

## 2022-05-12 DIAGNOSIS — S8992XA Unspecified injury of left lower leg, initial encounter: Secondary | ICD-10-CM | POA: Diagnosis present

## 2022-05-12 DIAGNOSIS — Z91040 Latex allergy status: Secondary | ICD-10-CM | POA: Insufficient documentation

## 2022-05-12 DIAGNOSIS — I4891 Unspecified atrial fibrillation: Secondary | ICD-10-CM | POA: Diagnosis not present

## 2022-05-12 DIAGNOSIS — Y9389 Activity, other specified: Secondary | ICD-10-CM | POA: Diagnosis not present

## 2022-05-12 DIAGNOSIS — S82832A Other fracture of upper and lower end of left fibula, initial encounter for closed fracture: Secondary | ICD-10-CM | POA: Insufficient documentation

## 2022-05-12 MED ORDER — OXYCODONE HCL 5 MG PO TABS
5.0000 mg | ORAL_TABLET | ORAL | Status: AC
  Administered 2022-05-12: 5 mg via ORAL
  Filled 2022-05-12: qty 1

## 2022-05-12 MED ORDER — ACETAMINOPHEN 500 MG PO TABS
1000.0000 mg | ORAL_TABLET | ORAL | Status: AC
  Administered 2022-05-12: 1000 mg via ORAL
  Filled 2022-05-12: qty 2

## 2022-05-12 NOTE — ED Triage Notes (Signed)
Pt presents to the ED after falling on Sunday. Denies hitting her head or LOC. States that it was a mechanical fall. Pt does report taking Eliquis. Pt reports left foot pain. States that husband dropped her off. Pt A&Ox4 at time of triage. VSS.

## 2022-05-12 NOTE — ED Provider Notes (Signed)
[facility] EMERGENCY DEPT Provider Note   CSN: [id number] Arrival date & time: 05/12/22  1825     History  Chief Complaint  Patient presents with   Foot Pain    Tammy [name] is a 61 y.o. female.  Patient as above with significant medical history as below, including A-fib on DOAC, anemia, anxiety, arthritis, depression who presents to the ED with complaint of fall.  Patient reports she was playing with her dogs, got tripped up over one of the dogs and fell to the ground.  Fell onto her left leg.  No head injury, no LOC.  No injury to hip.  No other injuries reported.  She has pain to her left lower extremity distally otherwise no other points of discomfort.  No numbness or tingling.  Compliant with her DOAC for A-fib.  No head injury, no nausea, vomiting, abdominal pain no chest pain.  Dyspnea.     Past Medical History:  Diagnosis Date   Anemia    Anxiety    Arthritis    neck and lower back - deg disc disease, fingers   Atrial fibrillation and flutter (HCC)    Depression    Frequency of urination    GERD (gastroesophageal reflux disease)    History of seizure per pt no seizure since    11-07-2015  in setting of stress and sleep deprivation  /  negative work-up by neurologist unknown idiology   Injury of thumb, left    03-31-2016     Pelvic relaxation    SUI (stress urinary incontinence, female)     Past Surgical History:  Procedure Laterality Date   ANTERIOR AND POSTERIOR REPAIR  09/25/2012   Procedure: ANTERIOR (CYSTOCELE) AND POSTERIOR REPAIR (RECTOCELE);  Surgeon: Lahoma Crocker, MD;  Location: [facility];  Service: Gynecology;  Laterality: N/A;   ANTERIOR AND POSTERIOR REPAIR WITH SACROSPINOUS FIXATION N/A 04/12/2016   Procedure: ANTERIOR AND POSTERIOR REPAIR WITH SACROSPINOUS LIGAMENT SUSPENSION, CYSTOSCOPY;  Surgeon: Arvella Nigh, MD;  Location: [facility];  Service: Gynecology;  Laterality: N/A;   APPENDECTOMY  1987   BREAST EXCISIONAL  BIOPSY Left    No scar visable    BREAST REDUCTION SURGERY  2000  aprrox   ENDOMETRIAL ABLATION W/ NOVASURE  2011   in office   EXCISION LEFT BREAST BX  12-07-1999   benign   LAPAROSCOPIC CHOLECYSTECTOMY  02-09-2005   LAPAROSCOPIC VAGINAL HYSTERECTOMY WITH SALPINGO OOPHORECTOMY Bilateral 04/12/2016   Procedure: LAPAROSCOPIC ASSISTED VAGINAL HYSTERECTOMY WITH SALPINGO OOPHORECTOMY;  Surgeon: Arvella Nigh, MD;  Location: [facility];  Service: Gynecology;  Laterality: Bilateral;   ORIF RADIAL FRACTURE Left 02/09/2021   Procedure: ORIF LEFT DISTAL RADIUS FRACTURE;  Surgeon: Cindra Presume, MD;  Location: [facility];  Service: Plastics;  Laterality: Left;  90 min   REDUCTION MAMMAPLASTY Bilateral    ROUX-EN-Y GASTRIC BYPASS  06/  2002   TUBAL LIGATION  10/ 1990     The history is provided by the patient. No language interpreter was used.  Foot Pain Pertinent negatives include no chest pain, no abdominal pain, no headaches and no shortness of breath.       Home Medications Prior to Admission medications   Medication Sig Start Date End Date Taking? Authorizing Provider  acetaminophen (TYLENOL) 325 MG tablet Take 2 tablets (650 mg total) by mouth every 6 (six) hours as needed. 05/13/22  Yes Wynona Dove A, DO  oxyCODONE (ROXICODONE) 5 MG immediate release tablet Take 1 tablet (5  mg total) by mouth every 4 (four) hours as needed for severe pain. 05/13/22  Yes Wynona Dove A, DO  apixaban (ELIQUIS) 5 MG TABS tablet TAKE ONE TABLET BY MOUTH TWICE A DAY 03/02/22   Minus Breeding, MD  buPROPion (WELLBUTRIN XL) 150 MG 24 hr tablet Take 2 tablets (300 mg total) by mouth daily. 10/25/21   Nita Sells, MD  ciprofloxacin (CIPRO) 500 MG tablet Take 1 tablet (500 mg total) by mouth every 12 (twelve) hours. 05/05/22   Tawnya Crook, MD  clonazePAM (KLONOPIN) 1 MG tablet Take 1 tablet (1 mg total) by mouth 2 (two) times daily. 10/25/21   Nita Sells, MD  dicyclomine (BENTYL) 10 MG  capsule Take 1 capsule (10 mg total) by mouth 4 (four) times daily -  before meals and at bedtime. 05/05/22   Tawnya Crook, MD  diphenoxylate-atropine (LOMOTIL) 2.5-0.025 MG tablet Take 1 tablet by mouth 4 (four) times daily as needed for diarrhea or loose stools. 05/05/22   Tawnya Crook, MD  FLUoxetine (PROZAC) 40 MG capsule Take 40 mg by mouth at bedtime. 06/23/21   [provider]  gabapentin (NEURONTIN) 600 MG tablet Take 1.5 tablets (900 mg total) by mouth 3 (three) times daily. 11/04/21   Tawnya Crook, MD  Melatonin 10 MG TABS Take 10 mg by mouth at bedtime as needed (sleep).    [provider]  metoprolol succinate (TOPROL-XL) 25 MG 24 hr tablet Take 1 tablet (25 mg total) by mouth at bedtime. 03/27/21   Minus Breeding, MD  OLANZapine (ZYPREXA) 10 MG tablet Take 10 mg by mouth at bedtime. 04/04/19   [provider]  ondansetron (ZOFRAN-ODT) 4 MG disintegrating tablet Take 1 tablet (4 mg total) by mouth every 8 (eight) hours as needed for nausea or vomiting. 04/29/22   Rancour, Annie Main, MD  potassium chloride SA (KLOR-CON M20) 20 MEQ tablet Take 1 tablet (20 mEq total) by mouth 2 (two) times daily. 02/03/22   Minus Breeding, MD  promethazine (PHENERGAN) 25 MG tablet Take 1 tablet (25 mg total) by mouth every 8 (eight) hours as needed for nausea or vomiting. 05/05/22   Tawnya Crook, MD      Allergies    Nsaids, Adhesive [tape], Latex, and Sulfa antibiotics    Review of Systems   Review of Systems  Constitutional:  Negative for activity change and fever.  HENT:  Negative for facial swelling and trouble swallowing.   Eyes:  Negative for discharge and redness.  Respiratory:  Negative for cough and shortness of breath.   Cardiovascular:  Negative for chest pain and palpitations.  Gastrointestinal:  Negative for abdominal pain and nausea.  Genitourinary:  Negative for dysuria and flank pain.  Musculoskeletal:  Positive for arthralgias. Negative for back pain and  gait problem.  Skin:  Negative for pallor and rash.  Neurological:  Negative for syncope and headaches.    Physical Exam Updated Vital Signs BP 128/80   Pulse 64   Temp 98.1 F (36.7 C) (Oral)   Resp 16   Ht '5\' 5"'$  (1.651 m)   Wt 117.9 kg   LMP 09/30/2012   SpO2 97%   BMI 43.27 kg/m  Physical Exam Vitals and nursing note reviewed.  Constitutional:      General: She is not in acute distress.    Appearance: Normal appearance.  HENT:     Head: Normocephalic and atraumatic. No raccoon eyes, Battle's sign, right periorbital erythema or left periorbital erythema.  Jaw: There is normal jaw occlusion.     Right Ear: External ear normal.     Left Ear: External ear normal.     Nose: Nose normal.     Mouth/Throat:     Mouth: Mucous membranes are moist.  Eyes:     General: No scleral icterus.       Right eye: No discharge.        Left eye: No discharge.  Cardiovascular:     Rate and Rhythm: Normal rate and regular rhythm.     Pulses: Normal pulses.     Heart sounds: Normal heart sounds.  Pulmonary:     Effort: Pulmonary effort is normal. No respiratory distress.     Breath sounds: Normal breath sounds.  Abdominal:     General: Abdomen is flat.     Tenderness: There is no abdominal tenderness.  Musculoskeletal:        General: Normal range of motion.     Cervical back: Normal range of motion.     Right lower leg: No edema.     Left lower leg: No edema.       Legs:     Comments: DP pulses 2+ bilateral lower extremities. Capillary fill is brisk to feet bilateral Mild TTP to distal fibula on left No TTP to proximal fibula on left Pelvis stable to AP pressure   Skin:    General: Skin is warm and dry.     Capillary Refill: Capillary refill takes less than 2 seconds.  Neurological:     Mental Status: She is alert and oriented to person, place, and time.     GCS: GCS eye subscore is 4. GCS verbal subscore is 5. GCS motor subscore is 6.  Psychiatric:        Mood and  Affect: Mood normal.        Behavior: Behavior normal.     ED Results / Procedures / Treatments   Labs (all labs ordered are listed, but only abnormal results are displayed) Labs Reviewed - No data to display  EKG None  Radiology DG Tibia/Fibula Left  Result Date: 05/12/2022 CLINICAL DATA:  Distal fibular fracture on foot x-ray. Fall on Sunday. EXAM: LEFT TIBIA AND FIBULA - 2 VIEW COMPARISON:  Foot radiograph earlier today. FINDINGS: Mildly displaced distal fibular fracture above the level of the ankle mortise. No associated tibia fracture. Proximal tibia and fibula are intact. There is generalized subcutaneous edema about the distal lower leg. IMPRESSION: Mildly displaced distal fibular fracture above the level of the ankle mortise. Electronically Signed   By: Keith Rake M.D.   On: 05/12/2022 22:37   DG Foot Complete Left  Result Date: 05/12/2022 CLINICAL DATA:  Fall EXAM: LEFT FOOT - COMPLETE 3+ VIEW COMPARISON:  None Available. FINDINGS: There is no evidence of fracture or dislocation. Plantar calcaneal spur. First digit metatarsophalangeal joint mild to moderate degenerative changes. Ankle and dorsum of the midfoot subcutaneus soft tissue edema. IMPRESSION: 1. No acute displaced fracture or dislocation of the left foot. 2. Ankle and dorsum of the midfoot subcutaneus soft tissue edema. Electronically Signed   By: Iven Finn M.D.   On: 05/12/2022 19:33    Procedures Procedures    Medications Ordered in ED Medications  acetaminophen (TYLENOL) tablet 1,000 mg (1,000 mg Oral Given 05/12/22 2231)  oxyCODONE (Oxy IR/ROXICODONE) immediate release tablet 5 mg (5 mg Oral Given 05/12/22 2231)    ED Course/ Medical Decision Making/ A&P  Medical Decision Making Amount and/or Complexity of Data Reviewed Radiology: ordered.    This patient presents to the ED with chief complaint(s) of fall with left leg pain with pertinent past medical history of A-fib on  DOAC, depression, anemia, anxiety which further complicates the presenting complaint. The complaint involves an extensive differential diagnosis and also carries with it a high risk of complications and morbidity.    The differential diagnosis includes sprain, strain, MSK, fracture, soft tissue, vascular, other QT labs are considered, serious etiologies considered.  The initial plan is to imaging   Additional history obtained: Additional history obtained from N/A Records reviewed previous admission documents and Primary Care Documents  Independent labs interpretation:  The following labs were independently interpreted: N/A   Independent visualization of imaging: - I independently visualized the following imaging with scope of interpretation limited to determining acute life threatening conditions related to emergency care: X-ray left foot, left tib-fib,, which revealed distal fibular fracture left  Treatment and Reassessment: Oral analgesics, splint, crutches  Consultation: - Consulted or discussed management/test interpretation w/ external professional: N/A  Consideration for admission or further workup: This was considered but feel patient benefit from outpatient management  Patient with distal fibula fracture on the left.  She is neurovascular intact.  No head trauma.  Placed in splint, given crutches.  Follow-up with Ortho.  The patient improved significantly and was discharged in stable condition. Detailed discussions were had with the patient regarding current findings, and need for close f/u with PCP or on call doctor. The patient has been instructed to return immediately if the symptoms worsen in any way for re-evaluation. Patient verbalized understanding and is in agreement with current care plan. All questions answered prior to discharge.    Social Determinants of health: Social History   Socioeconomic History   Marital status: Married    Spouse name: Suezette Lafave    Number of children: 3   Years of education: 16   Highest education level: Not on file  Occupational History   Occupation: [employer]  Tobacco Use   Smoking status: Never   Smokeless tobacco: Never  Vaping Use   Vaping Use: Never used  Substance and Sexual Activity   Alcohol use: No    Comment: drinks daily - none since 08/2012 per pt.   Drug use: No   Sexual activity: Yes    Birth control/protection: Surgical  Other Topics Concern   Not on file  Social History Narrative   Lives with spouse   Caffeine use: 2-3 cups per day   [employer] business analyst   Social Determinants of Health   Financial Resource Strain: Not on file  Food Insecurity: Not on file  Transportation Needs: Not on file  Physical Activity: Not on file  Stress: Not on file  Social Connections: Not on file  Intimate Partner Violence: Not on file            Final Clinical Impression(s) / ED Diagnoses Final diagnoses:  Closed fracture of distal end of left fibula, unspecified fracture morphology, initial encounter    Rx / DC Orders ED Discharge Orders          Ordered    oxyCODONE (ROXICODONE) 5 MG immediate release tablet  Every 4 hours PRN        05/13/22 0001    acetaminophen (TYLENOL) 325 MG tablet  Every 6 hours PRN        05/13/22 0001  Jeanell Sparrow, DO 05/13/22 0002

## 2022-05-13 MED ORDER — OXYCODONE HCL 5 MG PO TABS: 5.0000 mg | ORAL_TABLET | ORAL | 0 refills | Status: DC | PRN

## 2022-05-13 MED ORDER — ACETAMINOPHEN 325 MG PO TABS: 650.0000 mg | ORAL_TABLET | Freq: Four times a day (QID) | ORAL | 0 refills | Status: DC | PRN

## 2022-05-13 MED ORDER — HYDROCODONE-ACETAMINOPHEN 5-325 MG PO TABS
1.0000 | ORAL_TABLET | Freq: Once | ORAL | Status: AC
  Administered 2022-05-13: 1 via ORAL
  Filled 2022-05-13: qty 1

## 2022-05-13 NOTE — ED Notes (Signed)
Pt verbalizes understanding of discharge instructions. Opportunity for questioning and answers were provided. Pt discharged from ED to home with husband.    

## 2022-05-13 NOTE — Discharge Instructions (Signed)
It was a pleasure caring for you today in the emergency department.  Please return to the emergency department for any worsening or worrisome symptoms.  Please call orthopedics tomorrow for follow-up

## 2022-05-18 ENCOUNTER — Encounter: Payer: Self-pay | Admitting: Physician Assistant

## 2022-05-18 ENCOUNTER — Ambulatory Visit (INDEPENDENT_AMBULATORY_CARE_PROVIDER_SITE_OTHER): Payer: 59 | Admitting: Physician Assistant

## 2022-05-18 ENCOUNTER — Ambulatory Visit (INDEPENDENT_AMBULATORY_CARE_PROVIDER_SITE_OTHER): Payer: 59

## 2022-05-18 DIAGNOSIS — M25572 Pain in left ankle and joints of left foot: Secondary | ICD-10-CM

## 2022-05-18 MED ORDER — HYDROCODONE-ACETAMINOPHEN 5-325 MG PO TABS: 1.0000 | ORAL_TABLET | Freq: Four times a day (QID) | ORAL | 0 refills | Status: DC | PRN

## 2022-05-18 NOTE — Progress Notes (Signed)
Office Visit Note   Patient: Tammy [name]           Date of Birth: 1961-06-26           MRN: [id number] Visit Date: 05/18/2022              Requested by: Tawnya Crook, MD [place],  [place] [address] PCP: Tawnya Crook, MD  Chief Complaint  Patient presents with   Left Ankle - New Patient (Initial Visit)      HPI: Patient is a pleasant 61 year old woman with a chief complaint of left ankle pain.  She is 6 days status post falling at home.  She was seen and evaluated in the emergency room where x-rays of her tib-fib and foot demonstrated a fracture of the distal fibula above the mortise.  She was immobilized in a splint and stirrup.  She has been weightbearing on it but trying to be minimalist and using crutches she is on Eliquis  Assessment & Plan: Visit Diagnoses:  1. Pain in left ankle and joints of left foot     Plan: Patient has a distal fibula fracture and with some slight shortening above the mortise.  I reviewed the x-rays with Dr. Erlinda Hong who recommended surgical fixation.  Her husband is a patient of Dr. Sharol Given as she would be comfortable going forward with fixation of the fracture on Friday.  She will see Dr. Sharol Given on Thursday.  In the meantime we placed her in a boot she is to be nonweightbearing should be elevating her leg.  Follow-Up Instructions: No follow-ups on file.   Ortho Exam  Patient is alert, oriented, no adenopathy, well-dressed, normal affect, normal respiratory effort. Examination she has an easily palpable dorsalis pedis pulse she has moderate soft tissue swelling but no blistering.  She does have ecchymosis over the lateral side of the ankle.  Tenderness to touch less tenderness medially.  No tenderness over the proximal fibula  Imaging: XR Ankle Complete Left  Result Date: 05/18/2022 Radiographs of her left ankle reviewed in 3 projections today.  She does have a fracture of the distal fibula above the level of the mortise with some  mild shortening.  No images are attached to the encounter.  Labs: Lab Results  Component Value Date   HGBA1C 4.7 12/02/2021   HGBA1C 5.3 08/01/2019   CRP 1.6 (H) 04/14/2019   REPTSTATUS 05/01/2022 FINAL 04/29/2022   CULT >=100,000 COLONIES/mL KLEBSIELLA PNEUMONIAE (A) 04/29/2022   LABORGA KLEBSIELLA PNEUMONIAE (A) 04/29/2022     Lab Results  Component Value Date   ALBUMIN 4.6 04/29/2022   ALBUMIN 4.4 02/14/2022   ALBUMIN 3.9 12/02/2021    Lab Results  Component Value Date   MG 2.2 12/02/2021   MG 2.0 10/24/2021   MG 2.5 (H) 10/28/2019   Lab Results  Component Value Date   VD25OH 21.40 (L) 12/02/2021    No results found for: "PREALBUMIN"    Latest Ref Rng & Units 04/29/2022    7:23 AM 02/14/2022    1:33 PM 02/14/2022    1:18 PM  CBC EXTENDED  WBC 4.0 - 10.5 K/uL 8.9  8.5    RBC 3.87 - 5.11 MIL/uL 4.93  4.99    Hemoglobin 12.0 - 15.0 g/dL 14.4  13.8  15.0   HCT 36.0 - 46.0 % 43.8  44.5  44.0   Platelets 150 - 400 K/uL 248  199    NEUT# 1.7 - 7.7 K/uL 6.8  6.1    Lymph# 0.7 - 4.0 K/uL 1.6  1.6       There is no height or weight on file to calculate BMI.  Orders:  Orders Placed This Encounter  Procedures   XR Ankle Complete Left   Meds ordered this encounter  Medications   HYDROcodone-acetaminophen (NORCO/VICODIN) 5-325 MG tablet    Sig: Take 1 tablet by mouth every 6 (six) hours as needed for moderate pain.    Dispense:  30 tablet    Refill:  0     Procedures: No procedures performed  Clinical Data: No additional findings.  ROS:  All other systems negative, except as noted in the HPI. Review of Systems  Objective: Vital Signs: LMP 09/30/2012   Specialty Comments:  No specialty comments available.  PMFS History: Patient Active Problem List   Diagnosis Date Noted   History of gastric bypass 12/02/2021   Degenerative disc disease, lumbar 11/05/2021   Hallucinations 10/24/2021   Weakness 10/24/2021   AF (paroxysmal atrial fibrillation)  ([place]) 10/24/2021   OSA (obstructive sleep apnea) 10/24/2021   Encephalopathy 09/17/2021   Hyponatremia 09/17/2021   Anemia 09/17/2021   Iron deficiency anemia 09/17/2021   Closed nondisplaced fracture of neck of right radius 07/21/2020   Somnolence    Aspiration pneumonia ([place]) 02/20/2020   Lactic acidosis 76/28/3151   Acute metabolic encephalopathy 76/16/0737   Sepsis ([place]) 02/20/2020   Chronic pain of both knees 01/08/2020   Encephalopathy acute 10/27/2019   Acute encephalopathy 10/27/2019   CAP (community acquired pneumonia) 07/31/2019   Atrial fibrillation with RVR ([place]) 07/31/2019   Morbid obesity with BMI of 50.0-59.9, adult ([place]) 07/31/2019   Respiratory failure ([place]) 04/14/2019   Community acquired pneumonia 04/14/2019   Closed nondisplaced fracture of head of left radius 06/20/2018   Sprain of left wrist 06/20/2018   Peroneal tendinitis, right leg 06/23/2017   Trochanteric bursitis, right hip 06/23/2017   Acute right-sided low back pain with left-sided sciatica 03/24/2017   Pain in right hip 03/24/2017   Impingement syndrome of left shoulder 08/30/2016   Impingement syndrome of right shoulder 08/30/2016   Plantar fasciitis, bilateral 08/30/2016   Pelvic relaxation 04/13/2016    Class: Present on Admission   S/P hysterectomy 04/12/2016   Depression 11/16/2015   Generalized anxiety disorder 11/16/2015   Convulsion ([place]) 11/16/2015   SUI (stress urinary incontinence, female) 09/26/2012   Past Medical History:  Diagnosis Date   Anemia    Anxiety    Arthritis    neck and lower back - deg disc disease, fingers   Atrial fibrillation and flutter (HCC)    Depression    Frequency of urination    GERD (gastroesophageal reflux disease)    History of seizure per pt no seizure since    11-07-2015  in setting of stress and sleep deprivation  /  negative work-up by neurologist unknown idiology   Injury of thumb, left    03-31-2016     Pelvic relaxation    SUI (stress  urinary incontinence, female)     Family History  Problem Relation Age of Onset   Hypertension Mother    Hearing loss Mother    Arthritis Mother    Colon polyps Mother    Hearing loss Father    Arthritis Father    Heart disease Father 64       CABG   Depression Sister    Arthritis Sister    Depression Daughter    Arthritis Daughter  Alcohol abuse Daughter    Breast cancer Maternal Aunt    Diabetes Maternal Aunt        Type I   Stomach cancer Maternal Uncle    Colon cancer Maternal Uncle        x3   Diabetes Maternal Uncle        x5 - Type II   Stomach cancer Maternal Grandmother    Diabetes Maternal Grandmother    Heart disease Maternal Grandmother    Heart disease Paternal Grandmother    Esophageal cancer Neg Hx    Gallbladder disease Neg Hx    Seizures Neg Hx     Past Surgical History:  Procedure Laterality Date   ANTERIOR AND POSTERIOR REPAIR  09/25/2012   Procedure: ANTERIOR (CYSTOCELE) AND POSTERIOR REPAIR (RECTOCELE);  Surgeon: Lahoma Crocker, MD;  Location: [facility];  Service: Gynecology;  Laterality: N/A;   ANTERIOR AND POSTERIOR REPAIR WITH SACROSPINOUS FIXATION N/A 04/12/2016   Procedure: ANTERIOR AND POSTERIOR REPAIR WITH SACROSPINOUS LIGAMENT SUSPENSION, CYSTOSCOPY;  Surgeon: Arvella Nigh, MD;  Location: [facility];  Service: Gynecology;  Laterality: N/A;   APPENDECTOMY  1987   BREAST EXCISIONAL BIOPSY Left    No scar visable    BREAST REDUCTION SURGERY  2000  aprrox   ENDOMETRIAL ABLATION W/ NOVASURE  2011   in office   EXCISION LEFT BREAST BX  12-07-1999   benign   LAPAROSCOPIC CHOLECYSTECTOMY  02-09-2005   LAPAROSCOPIC VAGINAL HYSTERECTOMY WITH SALPINGO OOPHORECTOMY Bilateral 04/12/2016   Procedure: LAPAROSCOPIC ASSISTED VAGINAL HYSTERECTOMY WITH SALPINGO OOPHORECTOMY;  Surgeon: Arvella Nigh, MD;  Location: [facility];  Service: Gynecology;  Laterality: Bilateral;   ORIF RADIAL FRACTURE Left 02/09/2021   Procedure: ORIF  LEFT DISTAL RADIUS FRACTURE;  Surgeon: Cindra Presume, MD;  Location: [facility];  Service: Plastics;  Laterality: Left;  90 min   REDUCTION MAMMAPLASTY Bilateral    ROUX-EN-Y GASTRIC BYPASS  06/  2002   TUBAL LIGATION  10/ 1990   Social History   Occupational History   Occupation: Building control surveyor  Tobacco Use   Smoking status: Never   Smokeless tobacco: Never  Vaping Use   Vaping Use: Never used  Substance and Sexual Activity   Alcohol use: No    Comment: drinks daily - none since 08/2012 per pt.   Drug use: No   Sexual activity: Yes    Birth control/protection: Surgical

## 2022-05-20 ENCOUNTER — Encounter: Payer: Self-pay | Admitting: Orthopedic Surgery

## 2022-05-20 ENCOUNTER — Encounter (HOSPITAL_COMMUNITY): Payer: Self-pay | Admitting: Orthopedic Surgery

## 2022-05-20 ENCOUNTER — Other Ambulatory Visit: Payer: Self-pay

## 2022-05-20 ENCOUNTER — Ambulatory Visit (INDEPENDENT_AMBULATORY_CARE_PROVIDER_SITE_OTHER): Payer: 59 | Admitting: Orthopedic Surgery

## 2022-05-20 ENCOUNTER — Other Ambulatory Visit: Payer: Self-pay | Admitting: Cardiology

## 2022-05-20 ENCOUNTER — Telehealth: Payer: Self-pay | Admitting: Orthopedic Surgery

## 2022-05-20 DIAGNOSIS — S82892A Other fracture of left lower leg, initial encounter for closed fracture: Secondary | ICD-10-CM | POA: Diagnosis not present

## 2022-05-20 DIAGNOSIS — I480 Paroxysmal atrial fibrillation: Secondary | ICD-10-CM

## 2022-05-20 MED ORDER — OXYCODONE-ACETAMINOPHEN 5-325 MG PO TABS: 1.0000 | ORAL_TABLET | ORAL | 0 refills | Status: DC | PRN

## 2022-05-20 NOTE — Progress Notes (Signed)
Anesthesia Chart Review: SAME DAY WORK-UP  Case: [id number] Date/Time: 05/21/22 1105   Procedure: OPEN REDUCTION INTERNAL FIXATION (ORIF) LEFT DISTAL FIBULA FRACTURE (Left: Ankle)   Anesthesia type: Choice   Pre-op diagnosis: Left Distal Fibula Fracture   Location: [facility] OR ROOM 05 / [facility]   Surgeons: Newt Minion, MD       DISCUSSION: Patient is a 61 year old female scheduled for the above procedure. ED visit 05/12/22 for left leg injury after she tripped over one of her dogs. Xray revealed left distal fibular fracture. She was placed in splint and given crutches with out-patient orthopedic follow-up.  History includes never smoker, GERD, anemia, seizure (11/07/15; negative neurology evaluation), afib (with RVR in setting of sepsis/CAP 07/2019; aflutter on out-patient monitor 08/2019), OSA (intolerant to CPAP), Roux-en-Y gastric bypass (2002), cholecystectomy (02/09/05), hysterectomy (with BSO 04/12/16), pneumonia (CAP 04/2019, readmission 07/2019; aspiration PNA 5/201), left radius fracture (s/p ORIF 4/0/10), metabolic encephalopathy (possibly due to "polypharmacy versus conversion disorder", last hospitalization 10/2021.)   Last visit with cardiologist Dr. Percival Spanish was on 04/09/22. He wrote, "CHA2DS2 - VASc score of 2.  She tolerates anticoagulation.  Her paroxysms are very infrequent and she is not having any sustained tachyarrhythmias.  No change in therapy." One year follow-up planned.  Per Dr. Sharol Given, she may continue Eliquis perioperatively.  She his a same day work-up, so anesthesia team to evaluate on the day of surgery.    VS: LMP 09/30/2012  WT 117.9 kg 05/12/22.  BP Readings from Last 3 Encounters:  05/13/22 (!) 149/96  04/29/22 (!) 150/102  04/09/22 128/69   Pulse Readings from Last 3 Encounters:  05/13/22 (!) 52  04/29/22 65  04/09/22 71     PROVIDERS: Tawnya Crook, MD is PCP  Minus Breeding, MD is cardiologist   LABS: For labs on arrival as indicated. Last results  include: Lab Results  Component Value Date   WBC 8.9 04/29/2022   HGB 14.4 04/29/2022   HCT 43.8 04/29/2022   PLT 248 04/29/2022   GLUCOSE 99 04/29/2022   ALT 12 04/29/2022   AST 23 04/29/2022   NA 142 04/29/2022   K 4.0 04/29/2022   CL 105 04/29/2022   CREATININE 0.77 04/29/2022   BUN 12 04/29/2022   CO2 22 04/29/2022   TSH 0.681 09/17/2021   HGBA1C 4.7 12/02/2021    OTHER: EEG 09/17/21: IMPRESSION: This study is within normal limits. No seizures or epileptiform discharges were seen throughout the recording.   NPSG Sleep Study 10/15/19: "AHI 11.8/h; RDI 51.0/h; absence of REM sleep; loud snoring."    IMAGES: Xray left tibia/fibula 05/12/22: IMPRESSION: Mildly displaced distal fibular fracture above the level of the ankle mortise.   Xray left foot 05/12/22: IMPRESSION: 1. No acute displaced fracture or dislocation of the left foot. 2. Ankle and dorsum of the midfoot subcutaneus soft tissue edema.   1V PCXR 02/14/22: FINDINGS: 1405 hours. Low volume film. The cardio pericardial silhouette is enlarged. The lungs are clear without focal pneumonia, edema, pneumothorax or pleural effusion. The visualized bony structures of the thorax are unremarkable. Telemetry leads overlie the chest. IMPRESSION: Low volume film without acute cardiopulmonary findings.   EKG: 02/14/22: Normal sinus rhythm Nonspecific ST abnormality Abnormal ECG When compared with ECG of 24-Oct-2021 17:59, No significant change was found agree. Confirmed by Charlesetta Shanks [phone]) on 02/14/2022 2:56:08 PM   CV: TTE 02/20/20:  1. Left ventricular ejection fraction, by estimation, is 60 to 65%. The  left ventricle has normal function. The  left ventricle has no regional  wall motion abnormalities. There is mild left ventricular hypertrophy.  Left ventricular diastolic parameters  were normal.   2. Right ventricular systolic function is normal. The right ventricular  size is normal. There is severely  elevated pulmonary artery systolic  pressure.   3. The mitral valve is normal in structure. Trivial mitral valve  regurgitation. No evidence of mitral stenosis.   4. The aortic valve is normal in structure. Aortic valve regurgitation is  not visualized. No aortic stenosis is present.   5. The inferior vena cava is normal in size with greater than 50%  respiratory variability, suggesting right atrial pressure of 3 mmHg.    Event monitor 08/25/19-09/01/19: NSR Paroxysmal atrial flutter Paroxysmal atrial fib    Past Medical History:  Diagnosis Date   Anemia    Anxiety    Arthritis    neck and lower back - deg disc disease, fingers   Atrial fibrillation and flutter (HCC)    Depression    Frequency of urination    GERD (gastroesophageal reflux disease)    History of seizure per pt no seizure since    11-07-2015  in setting of stress and sleep deprivation  /  negative work-up by neurologist unknown idiology   Injury of thumb, left    03-31-2016     Pelvic relaxation    SUI (stress urinary incontinence, female)     Past Surgical History:  Procedure Laterality Date   ANTERIOR AND POSTERIOR REPAIR  09/25/2012   Procedure: ANTERIOR (CYSTOCELE) AND POSTERIOR REPAIR (RECTOCELE);  Surgeon: Lahoma Crocker, MD;  Location: [facility];  Service: Gynecology;  Laterality: N/A;   ANTERIOR AND POSTERIOR REPAIR WITH SACROSPINOUS FIXATION N/A 04/12/2016   Procedure: ANTERIOR AND POSTERIOR REPAIR WITH SACROSPINOUS LIGAMENT SUSPENSION, CYSTOSCOPY;  Surgeon: Arvella Nigh, MD;  Location: [facility];  Service: Gynecology;  Laterality: N/A;   APPENDECTOMY  1987   BREAST EXCISIONAL BIOPSY Left    No scar visable    BREAST REDUCTION SURGERY  2000  aprrox   ENDOMETRIAL ABLATION W/ NOVASURE  2011   in office   EXCISION LEFT BREAST BX  12-07-1999   benign   LAPAROSCOPIC CHOLECYSTECTOMY  02-09-2005   LAPAROSCOPIC VAGINAL HYSTERECTOMY WITH SALPINGO OOPHORECTOMY Bilateral 04/12/2016    Procedure: LAPAROSCOPIC ASSISTED VAGINAL HYSTERECTOMY WITH SALPINGO OOPHORECTOMY;  Surgeon: Arvella Nigh, MD;  Location: [facility];  Service: Gynecology;  Laterality: Bilateral;   ORIF RADIAL FRACTURE Left 02/09/2021   Procedure: ORIF LEFT DISTAL RADIUS FRACTURE;  Surgeon: Cindra Presume, MD;  Location: [facility];  Service: Plastics;  Laterality: Left;  90 min   REDUCTION MAMMAPLASTY Bilateral    ROUX-EN-Y GASTRIC BYPASS  06/  2002   TUBAL LIGATION  10/ 1990    MEDICATIONS: No current facility-administered medications for this encounter.    apixaban (ELIQUIS) 5 MG TABS tablet   buPROPion (WELLBUTRIN XL) 150 MG 24 hr tablet   cholecalciferol (VITAMIN D3) 25 MCG (1000 UNIT) tablet   clonazePAM (KLONOPIN) 1 MG tablet   cyanocobalamin (VITAMIN B12) 1000 MCG tablet   dicyclomine (BENTYL) 10 MG capsule   diphenoxylate-atropine (LOMOTIL) 2.5-0.025 MG tablet   ferrous sulfate 325 (65 FE) MG tablet   FLUoxetine (PROZAC) 40 MG capsule   gabapentin (NEURONTIN) 600 MG tablet   HYDROcodone-acetaminophen (NORCO/VICODIN) 5-325 MG tablet   Melatonin 10 MG TABS   metoprolol succinate (TOPROL-XL) 25 MG 24 hr tablet   OLANZapine (ZYPREXA) 10 MG tablet   ondansetron (ZOFRAN-ODT)  4 MG disintegrating tablet   potassium chloride SA (KLOR-CON M20) 20 MEQ tablet   promethazine (PHENERGAN) 25 MG tablet   ciprofloxacin (CIPRO) 500 MG tablet   oxyCODONE (ROXICODONE) 5 MG immediate release tablet  She completed Cipro course (+ Klebsiella Pneumoniae urine culture 04/29/22).  Myra Gianotti, PA-C Surgical Short Stay/Anesthesiology [facility] Phone [phone] [facility] Phone [phone] 05/20/2022 11:48 AM

## 2022-05-20 NOTE — Progress Notes (Signed)
Office Visit Note   Patient: Tammy [name]           Date of Birth: Jan 05, 1961           MRN: [id number] Visit Date: 05/20/2022              Requested by: Tawnya Crook, MD [place],  [place] [address] PCP: Tawnya Crook, MD  Chief Complaint  Patient presents with   Left Ankle - Fracture    Scheduled for ORIF 05/20/22      HPI: Patient is a 61 year old woman who is seen for [name] secondary to a fall at home.  Patient is on Eliquis.  Assessment & Plan: Visit Diagnoses:  1. Closed fracture of left ankle, initial encounter     Plan: Discussed with the patient risks and benefits of surgery postoperative care.  Patient states she understands wished to proceed at this time.  She will continue her Eliquis.  Follow-Up Instructions: Return in about 1 week (around 05/27/2022).   Ortho Exam  Patient is alert, oriented, no adenopathy, well-dressed, normal affect, normal respiratory effort. Examination patient has a palpable dorsalis pedis pulse there is ecchymosis and bruising laterally secondary to her Eliquis.  The radiographs shows a displaced Weber B fibular fracture with shortening of the fibula and several millimeters of displacement.  The fracture is closed.  Imaging: No results found. No images are attached to the encounter.  Labs: Lab Results  Component Value Date   HGBA1C 4.7 12/02/2021   HGBA1C 5.3 08/01/2019   CRP 1.6 (H) 04/14/2019   REPTSTATUS 05/01/2022 FINAL 04/29/2022   CULT >=100,000 COLONIES/mL KLEBSIELLA PNEUMONIAE (A) 04/29/2022   LABORGA KLEBSIELLA PNEUMONIAE (A) 04/29/2022     Lab Results  Component Value Date   ALBUMIN 4.6 04/29/2022   ALBUMIN 4.4 02/14/2022   ALBUMIN 3.9 12/02/2021    Lab Results  Component Value Date   MG 2.2 12/02/2021   MG 2.0 10/24/2021   MG 2.5 (H) 10/28/2019   Lab Results  Component Value Date   VD25OH 21.40 (L) 12/02/2021    No results  found for: "PREALBUMIN"    Latest Ref Rng & Units 04/29/2022    7:23 AM 02/14/2022    1:33 PM 02/14/2022    1:18 PM  CBC EXTENDED  WBC 4.0 - 10.5 K/uL 8.9  8.5    RBC 3.87 - 5.11 MIL/uL 4.93  4.99    Hemoglobin 12.0 - 15.0 g/dL 14.4  13.8  15.0   HCT 36.0 - 46.0 % 43.8  44.5  44.0   Platelets 150 - 400 K/uL 248  199    NEUT# 1.7 - 7.7 K/uL 6.8  6.1    Lymph# 0.7 - 4.0 K/uL 1.6  1.6       There is no height or weight on file to calculate BMI.  Orders:  No orders of the defined types were placed in this encounter.  Meds ordered this encounter  Medications   oxyCODONE-acetaminophen (PERCOCET/ROXICET) 5-325 MG tablet    Sig: Take 1 tablet by mouth every 4 (four) hours as needed for severe pain.    Dispense:  30 tablet    Refill:  0     Procedures: No procedures performed  Clinical Data: No additional findings.  ROS:  All other systems negative, except as noted in the HPI. Review of Systems  Objective: Vital Signs: LMP 09/30/2012   Specialty Comments:  No specialty comments  available.  PMFS History: Patient Active Problem List   Diagnosis Date Noted   History of gastric bypass 12/02/2021   Degenerative disc disease, lumbar 11/05/2021   Hallucinations 10/24/2021   Weakness 10/24/2021   AF (paroxysmal atrial fibrillation) ([place]) 10/24/2021   OSA (obstructive sleep apnea) 10/24/2021   Encephalopathy 09/17/2021   Hyponatremia 09/17/2021   Anemia 09/17/2021   Iron deficiency anemia 09/17/2021   Closed nondisplaced fracture of neck of right radius 07/21/2020   Somnolence    Aspiration pneumonia ([place]) 02/20/2020   Lactic acidosis 93/23/5573   Acute metabolic encephalopathy 22/11/5425   Sepsis ([place]) 02/20/2020   Chronic pain of both knees 01/08/2020   Encephalopathy acute 10/27/2019   Acute encephalopathy 10/27/2019   CAP (community acquired pneumonia) 07/31/2019   Atrial fibrillation with RVR ([place]) 07/31/2019   Morbid obesity with BMI of 50.0-59.9, adult ([place])  07/31/2019   Respiratory failure ([place]) 04/14/2019   Community acquired pneumonia 04/14/2019   Closed nondisplaced fracture of head of left radius 06/20/2018   Sprain of left wrist 06/20/2018   Peroneal tendinitis, right leg 06/23/2017   Trochanteric bursitis, right hip 06/23/2017   Acute right-sided low back pain with left-sided sciatica 03/24/2017   Pain in right hip 03/24/2017   Impingement syndrome of left shoulder 08/30/2016   Impingement syndrome of right shoulder 08/30/2016   Plantar fasciitis, bilateral 08/30/2016   Pelvic relaxation 04/13/2016    Class: Present on Admission   S/P hysterectomy 04/12/2016   Depression 11/16/2015   Generalized anxiety disorder 11/16/2015   Convulsion ([place]) 11/16/2015   SUI (stress urinary incontinence, female) 09/26/2012   Past Medical History:  Diagnosis Date   Anemia    Anxiety    Arthritis    neck and lower back - deg disc disease, fingers   Atrial fibrillation and flutter ([place])    COVID-19 2020   Depression    Frequency of urination    GERD (gastroesophageal reflux disease)    History of seizure per pt no seizure since    11-07-2015  in setting of stress and sleep deprivation  /  negative work-up by neurologist unknown idiology   Injury of thumb, left    03-31-2016     Pelvic relaxation    Pneumonia    SUI (stress urinary incontinence, female)     Family History  Problem Relation Age of Onset   Hypertension Mother    Hearing loss Mother    Arthritis Mother    Colon polyps Mother    Hearing loss Father    Arthritis Father    Heart disease Father 33       CABG   Depression Sister    Arthritis Sister    Depression Daughter    Arthritis Daughter    Alcohol abuse Daughter    Breast cancer Maternal Aunt    Diabetes Maternal Aunt        Type I   Stomach cancer Maternal Uncle    Colon cancer Maternal Uncle        x3   Diabetes Maternal Uncle        x5 - Type II   Stomach cancer Maternal Grandmother    Diabetes Maternal  Grandmother    Heart disease Maternal Grandmother    Heart disease Paternal Grandmother    Esophageal cancer Neg Hx    Gallbladder disease Neg Hx    Seizures Neg Hx     Past Surgical History:  Procedure Laterality Date   ANTERIOR AND POSTERIOR REPAIR  09/25/2012   Procedure: ANTERIOR (CYSTOCELE) AND POSTERIOR REPAIR (RECTOCELE);  Surgeon: Lahoma Crocker, MD;  Location: [facility];  Service: Gynecology;  Laterality: N/A;   ANTERIOR AND POSTERIOR REPAIR WITH SACROSPINOUS FIXATION N/A 04/12/2016   Procedure: ANTERIOR AND POSTERIOR REPAIR WITH SACROSPINOUS LIGAMENT SUSPENSION, CYSTOSCOPY;  Surgeon: Arvella Nigh, MD;  Location: [facility];  Service: Gynecology;  Laterality: N/A;   APPENDECTOMY  1987   BREAST EXCISIONAL BIOPSY Left    No scar visable    BREAST REDUCTION SURGERY  2000  aprrox   ENDOMETRIAL ABLATION W/ NOVASURE  2011   in office   EXCISION LEFT BREAST BX  12-07-1999   benign   LAPAROSCOPIC CHOLECYSTECTOMY  02-09-2005   LAPAROSCOPIC VAGINAL HYSTERECTOMY WITH SALPINGO OOPHORECTOMY Bilateral 04/12/2016   Procedure: LAPAROSCOPIC ASSISTED VAGINAL HYSTERECTOMY WITH SALPINGO OOPHORECTOMY;  Surgeon: Arvella Nigh, MD;  Location: [facility];  Service: Gynecology;  Laterality: Bilateral;   ORIF RADIAL FRACTURE Left 02/09/2021   Procedure: ORIF LEFT DISTAL RADIUS FRACTURE;  Surgeon: Cindra Presume, MD;  Location: [facility];  Service: Plastics;  Laterality: Left;  90 min   REDUCTION MAMMAPLASTY Bilateral    ROUX-EN-Y GASTRIC BYPASS  06/  2002   TUBAL LIGATION  10/ 1990   Social History   Occupational History   Occupation: Building control surveyor  Tobacco Use   Smoking status: Never   Smokeless tobacco: Never  Vaping Use   Vaping Use: Never used  Substance and Sexual Activity   Alcohol use: No    Comment: drinks daily - none since 08/2012 per pt.   Drug use: No   Sexual activity: Yes    Birth control/protection: Surgical

## 2022-05-20 NOTE — Progress Notes (Signed)
PCP - Tawnya Crook, MD Cardiologist - Minus Breeding, MD  Chest x-ray - 02/14/22 EKG - 02/17/22 ECHO - 02/20/20  Blood Thinner Instructions: Eliquis  ERAS Protcol - Clears until 0815  Anesthesia review: Y  Patient verbally denies any shortness of breath, fever, cough and chest pain during phone call   -------------  SDW INSTRUCTIONS given:  Your procedure is scheduled on 05/21/22.  Report to [facility] Main Entrance "A" at 0850 A.M., and check in at the Admitting office.  Call this number if you have problems the morning of surgery:  [phone]   Remember:  Do not eat after midnight the night before your surgery  You may drink clear liquids until 0815 the morning of your surgery.   Clear liquids allowed are: Water, Non-Citrus Juices (without pulp), Carbonated Beverages, Clear Tea, Black Coffee Only, and Gatorade    Take these medicines the morning of surgery with A SIP OF WATER  buPROPion (WELLBUTRIN XL)  apixaban (ELIQUIS)  clonazePAM (KLONOPIN)  gabapentin (NEURONTIN) ondansetron (ZOFRAN-ODT)-if needed HYDROcodone-acetaminophen (NORCO/VICODIN)-if needed  As of today, STOP taking any Aspirin (unless otherwise instructed by your surgeon) Aleve, Naproxen, Ibuprofen, Motrin, Advil, Goody's, BC's, all herbal medications, fish oil, and all vitamins.                      Do not wear jewelry, make up, or nail polish            Do not wear lotions, powders, perfumes/colognes, or deodorant.            Do not shave 48 hours prior to surgery.  Men may shave face and neck.            Do not bring valuables to the hospital.            [facility] is not responsible for any belongings or valuables.  Do NOT Smoke (Tobacco/Vaping) 24 hours prior to your procedure If you use a CPAP at night, you may bring all equipment for your overnight stay.   Contacts, glasses, dentures or bridgework may not be worn into surgery.      For patients admitted to the hospital, discharge time  will be determined by your treatment team.   Patients discharged the day of surgery will not be allowed to drive home, and someone needs to stay with them for 24 hours.    Special instructions:   [facility]- Preparing For Surgery  Before surgery, you can play an important role. Because skin is not sterile, your skin needs to be as free of germs as possible. You can reduce the number of germs on your skin by washing with CHG (chlorahexidine gluconate) Soap before surgery.  CHG is an antiseptic cleaner which kills germs and bonds with the skin to continue killing germs even after washing.    Oral Hygiene is also important to reduce your risk of infection.  Remember - BRUSH YOUR TEETH THE MORNING OF SURGERY WITH YOUR REGULAR TOOTHPASTE  Please do not use if you have an allergy to CHG or antibacterial soaps. If your skin becomes reddened/irritated stop using the CHG.  Do not shave (including legs and underarms) for at least 48 hours prior to first CHG shower. It is OK to shave your face.  Please follow these instructions carefully.   Shower the NIGHT BEFORE SURGERY and the MORNING OF SURGERY with DIAL Soap.   Pat yourself dry with a CLEAN TOWEL.  Wear CLEAN PAJAMAS to bed  the night before surgery  Place CLEAN SHEETS on your bed the night of your first shower and DO NOT SLEEP WITH PETS.   Day of Surgery: Please shower morning of surgery  Wear Clean/Comfortable clothing the morning of surgery Do not apply any deodorants/lotions.   Remember to brush your teeth WITH YOUR REGULAR TOOTHPASTE.   Questions were answered. Patient verbalized understanding of instructions.

## 2022-05-20 NOTE — Anesthesia Preprocedure Evaluation (Addendum)
Anesthesia Evaluation  Patient identified by MRN, date of birth, ID band Patient awake    Reviewed: Allergy & Precautions, NPO status , Patient's Chart, lab work & pertinent test results, reviewed documented beta blocker date and time   Airway Mallampati: II  TM Distance: >3 FB     Dental no notable dental hx. (+) Teeth Intact, Dental Advisory Given   Pulmonary sleep apnea and Continuous Positive Airway Pressure Ventilation , pneumonia, resolved,    Pulmonary exam normal breath sounds clear to auscultation       Cardiovascular Pt. on home beta blockers Normal cardiovascular exam+ dysrhythmias Atrial Fibrillation  Rhythm:Regular Rate:Normal     Neuro/Psych Seizures -, Well Controlled,  PSYCHIATRIC DISORDERS Anxiety Depression  Neuromuscular disease    GI/Hepatic Neg liver ROS, GERD  Medicated and Controlled,S/P Gastric bypass   Endo/Other  Morbid obesity  Renal/GU negative Renal ROS Bladder dysfunction  SUI    Musculoskeletal  (+) Arthritis , Osteoarthritis,  Left distal fibula Fx   Abdominal (+) + obese,   Peds  Hematology  (+) Blood dyscrasia, anemia , Eliquis therapy- last dose   Anesthesia Other Findings   Reproductive/Obstetrics                             Anesthesia Physical Anesthesia Plan  ASA: 3  Anesthesia Plan:    Post-op Pain Management: Ofirmev IV (intra-op)*, Dilaudid IV and Precedex   Induction: Intravenous  PONV Risk Score and Plan: 3 and Treatment may vary due to age or medical condition  Airway Management Planned: LMA  Additional Equipment: None  Intra-op Plan:   Post-operative Plan: Extubation in OR  Informed Consent: I have reviewed the patients History and Physical, chart, labs and discussed the procedure including the risks, benefits and alternatives for the proposed anesthesia with the patient or authorized representative who has indicated his/her  understanding and acceptance.     Dental advisory given  Plan Discussed with: CRNA and Anesthesiologist  Anesthesia Plan Comments: (PAT note written 05/20/2022 by Myra Gianotti, PA-C. )       Anesthesia Quick Evaluation

## 2022-05-20 NOTE — Telephone Encounter (Signed)
I called Tammy [name] to discuss her left ankle surgery that is scheduled for tomorrow, Friday 8/18.  Left message for her to please return my call.

## 2022-05-21 ENCOUNTER — Ambulatory Visit (HOSPITAL_COMMUNITY)
Admission: RE | Admit: 2022-05-21 | Discharge: 2022-05-21 | Disposition: A | Discharge status: Home / Self Care | Payer: 59 | Attending: Orthopedic Surgery | Admitting: Orthopedic Surgery

## 2022-05-21 ENCOUNTER — Encounter (HOSPITAL_COMMUNITY)
Admission: RE | Disposition: A | Discharge status: Home / Self Care | Payer: Self-pay | Source: Home / Self Care | Attending: Orthopedic Surgery

## 2022-05-21 ENCOUNTER — Encounter (HOSPITAL_COMMUNITY): Payer: Self-pay | Admitting: Orthopedic Surgery

## 2022-05-21 ENCOUNTER — Other Ambulatory Visit: Payer: Self-pay

## 2022-05-21 ENCOUNTER — Ambulatory Visit (HOSPITAL_COMMUNITY): Payer: 59 | Admitting: Vascular Surgery

## 2022-05-21 ENCOUNTER — Ambulatory Visit (HOSPITAL_BASED_OUTPATIENT_CLINIC_OR_DEPARTMENT_OTHER): Payer: 59 | Admitting: Vascular Surgery

## 2022-05-21 ENCOUNTER — Ambulatory Visit (HOSPITAL_COMMUNITY): Payer: 59

## 2022-05-21 ENCOUNTER — Telehealth: Payer: Self-pay | Admitting: Cardiology

## 2022-05-21 DIAGNOSIS — Z7901 Long term (current) use of anticoagulants: Secondary | ICD-10-CM | POA: Diagnosis not present

## 2022-05-21 DIAGNOSIS — S82832A Other fracture of upper and lower end of left fibula, initial encounter for closed fracture: Secondary | ICD-10-CM | POA: Diagnosis not present

## 2022-05-21 DIAGNOSIS — G40909 Epilepsy, unspecified, not intractable, without status epilepticus: Secondary | ICD-10-CM | POA: Diagnosis not present

## 2022-05-21 DIAGNOSIS — G4733 Obstructive sleep apnea (adult) (pediatric): Secondary | ICD-10-CM | POA: Diagnosis not present

## 2022-05-21 DIAGNOSIS — K219 Gastro-esophageal reflux disease without esophagitis: Secondary | ICD-10-CM | POA: Insufficient documentation

## 2022-05-21 DIAGNOSIS — F418 Other specified anxiety disorders: Secondary | ICD-10-CM | POA: Diagnosis not present

## 2022-05-21 DIAGNOSIS — Z9884 Bariatric surgery status: Secondary | ICD-10-CM | POA: Diagnosis not present

## 2022-05-21 DIAGNOSIS — S82492A Other fracture of shaft of left fibula, initial encounter for closed fracture: Secondary | ICD-10-CM | POA: Diagnosis present

## 2022-05-21 DIAGNOSIS — Y92009 Unspecified place in unspecified non-institutional (private) residence as the place of occurrence of the external cause: Secondary | ICD-10-CM | POA: Diagnosis not present

## 2022-05-21 DIAGNOSIS — I4892 Unspecified atrial flutter: Secondary | ICD-10-CM | POA: Diagnosis not present

## 2022-05-21 DIAGNOSIS — W19XXXA Unspecified fall, initial encounter: Secondary | ICD-10-CM | POA: Insufficient documentation

## 2022-05-21 DIAGNOSIS — G4730 Sleep apnea, unspecified: Secondary | ICD-10-CM | POA: Insufficient documentation

## 2022-05-21 DIAGNOSIS — S8262XA Displaced fracture of lateral malleolus of left fibula, initial encounter for closed fracture: Secondary | ICD-10-CM | POA: Diagnosis not present

## 2022-05-21 DIAGNOSIS — Z9989 Dependence on other enabling machines and devices: Secondary | ICD-10-CM | POA: Diagnosis not present

## 2022-05-21 DIAGNOSIS — I480 Paroxysmal atrial fibrillation: Secondary | ICD-10-CM | POA: Insufficient documentation

## 2022-05-21 HISTORY — PX: ORIF ANKLE FRACTURE: SHX5408

## 2022-05-21 HISTORY — DX: Pneumonia, unspecified organism: J18.9

## 2022-05-21 SURGERY — OPEN REDUCTION INTERNAL FIXATION (ORIF) ANKLE FRACTURE
Anesthesia: Choice | Site: Ankle | Laterality: Left

## 2022-05-21 MED ORDER — HYDROMORPHONE HCL 1 MG/ML IJ SOLN
INTRAMUSCULAR | Status: AC
  Filled 2022-05-21: qty 1

## 2022-05-21 MED ORDER — ACETAMINOPHEN 10 MG/ML IV SOLN
INTRAVENOUS | Status: DC | PRN
  Administered 2022-05-21: 1000 mg via INTRAVENOUS

## 2022-05-21 MED ORDER — HYDROMORPHONE HCL 1 MG/ML IJ SOLN
INTRAMUSCULAR | Status: DC | PRN
  Administered 2022-05-21: .5 mg via INTRAVENOUS

## 2022-05-21 MED ORDER — MIDAZOLAM HCL 2 MG/2ML IJ SOLN
INTRAMUSCULAR | Status: DC | PRN
  Administered 2022-05-21: 2 mg via INTRAVENOUS

## 2022-05-21 MED ORDER — HYDROMORPHONE HCL 1 MG/ML IJ SOLN
0.2500 mg | INTRAMUSCULAR | Status: DC | PRN
  Administered 2022-05-21 (×2): 0.5 mg via INTRAVENOUS

## 2022-05-21 MED ORDER — DEXMEDETOMIDINE HCL IN NACL 80 MCG/20ML IV SOLN
INTRAVENOUS | Status: AC
  Filled 2022-05-21: qty 20

## 2022-05-21 MED ORDER — PROPOFOL 10 MG/ML IV BOLUS
INTRAVENOUS | Status: DC | PRN
  Administered 2022-05-21: 200 mg via INTRAVENOUS

## 2022-05-21 MED ORDER — LIDOCAINE 2% (20 MG/ML) 5 ML SYRINGE
INTRAMUSCULAR | Status: DC | PRN
  Administered 2022-05-21: 60 mg via INTRAVENOUS

## 2022-05-21 MED ORDER — ESMOLOL HCL 100 MG/10ML IV SOLN
INTRAVENOUS | Status: DC | PRN
  Administered 2022-05-21: 10 mg via INTRAVENOUS
  Administered 2022-05-21: 20 mg via INTRAVENOUS
  Administered 2022-05-21: 10 mg via INTRAVENOUS
  Administered 2022-05-21: 20 mg via INTRAVENOUS

## 2022-05-21 MED ORDER — DEXAMETHASONE SODIUM PHOSPHATE 10 MG/ML IJ SOLN
INTRAMUSCULAR | Status: DC | PRN
  Administered 2022-05-21: 8 mg via INTRAVENOUS

## 2022-05-21 MED ORDER — ONDANSETRON HCL 4 MG/2ML IJ SOLN
INTRAMUSCULAR | Status: AC
Start: 2022-05-21 — End: ?
  Filled 2022-05-21: qty 2

## 2022-05-21 MED ORDER — FENTANYL CITRATE (PF) 250 MCG/5ML IJ SOLN
INTRAMUSCULAR | Status: AC
  Filled 2022-05-21: qty 5

## 2022-05-21 MED ORDER — ORAL CARE MOUTH RINSE: 15.0000 mL | Freq: Once | OROMUCOSAL | Status: AC

## 2022-05-21 MED ORDER — CHLORHEXIDINE GLUCONATE 0.12 % MT SOLN
15.0000 mL | Freq: Once | OROMUCOSAL | Status: AC
  Administered 2022-05-21: 15 mL via OROMUCOSAL
  Filled 2022-05-21: qty 15

## 2022-05-21 MED ORDER — OXYCODONE HCL 5 MG PO TABS
ORAL_TABLET | ORAL | Status: AC
  Filled 2022-05-21: qty 1

## 2022-05-21 MED ORDER — LACTATED RINGERS IV SOLN: INTRAVENOUS | Status: DC

## 2022-05-21 MED ORDER — OXYCODONE HCL 5 MG/5ML PO SOLN: 5.0000 mg | Freq: Once | ORAL | Status: AC | PRN

## 2022-05-21 MED ORDER — FENTANYL CITRATE (PF) 100 MCG/2ML IJ SOLN
INTRAMUSCULAR | Status: DC | PRN
  Administered 2022-05-21: 100 ug via INTRAVENOUS
  Administered 2022-05-21: 50 ug via INTRAVENOUS
  Administered 2022-05-21: 100 ug via INTRAVENOUS

## 2022-05-21 MED ORDER — METOPROLOL TARTRATE 12.5 MG HALF TABLET
12.5000 mg | ORAL_TABLET | Freq: Once | ORAL | Status: AC
Start: 2022-05-21 — End: 2022-05-21
  Administered 2022-05-21: 12.5 mg via ORAL
  Filled 2022-05-21 (×2): qty 1

## 2022-05-21 MED ORDER — LIDOCAINE 2% (20 MG/ML) 5 ML SYRINGE
INTRAMUSCULAR | Status: AC
  Filled 2022-05-21: qty 15

## 2022-05-21 MED ORDER — OXYCODONE HCL 5 MG PO TABS
5.0000 mg | ORAL_TABLET | Freq: Once | ORAL | Status: AC | PRN
  Administered 2022-05-21: 5 mg via ORAL

## 2022-05-21 MED ORDER — ONDANSETRON HCL 4 MG/2ML IJ SOLN
4.0000 mg | Freq: Once | INTRAMUSCULAR | Status: DC | PRN
Start: 2022-05-21 — End: 2022-05-21

## 2022-05-21 MED ORDER — DEXMEDETOMIDINE (PRECEDEX) IN NS 20 MCG/5ML (4 MCG/ML) IV SYRINGE
PREFILLED_SYRINGE | INTRAVENOUS | Status: DC | PRN
  Administered 2022-05-21 (×4): 8 ug via INTRAVENOUS

## 2022-05-21 MED ORDER — DEXAMETHASONE SODIUM PHOSPHATE 10 MG/ML IJ SOLN
INTRAMUSCULAR | Status: AC
  Filled 2022-05-21: qty 1

## 2022-05-21 MED ORDER — CEFAZOLIN SODIUM-DEXTROSE 2-4 GM/100ML-% IV SOLN
2.0000 g | INTRAVENOUS | Status: AC
  Administered 2022-05-21: 2 g via INTRAVENOUS
  Filled 2022-05-21: qty 100

## 2022-05-21 MED ORDER — ACETAMINOPHEN 10 MG/ML IV SOLN
INTRAVENOUS | Status: AC
  Filled 2022-05-21: qty 100

## 2022-05-21 MED ORDER — HYDROMORPHONE HCL 1 MG/ML IJ SOLN
INTRAMUSCULAR | Status: AC
  Filled 2022-05-21: qty 0.5

## 2022-05-21 MED ORDER — MIDAZOLAM HCL 2 MG/2ML IJ SOLN
INTRAMUSCULAR | Status: AC
  Filled 2022-05-21: qty 2

## 2022-05-21 MED ORDER — 0.9 % SODIUM CHLORIDE (POUR BTL) OPTIME
TOPICAL | Status: DC | PRN
  Administered 2022-05-21: 1000 mL

## 2022-05-21 MED ORDER — ONDANSETRON HCL 4 MG/2ML IJ SOLN
INTRAMUSCULAR | Status: DC | PRN
  Administered 2022-05-21: 4 mg via INTRAVENOUS

## 2022-05-21 MED ORDER — PROPOFOL 10 MG/ML IV BOLUS
INTRAVENOUS | Status: AC
  Filled 2022-05-21: qty 20

## 2022-05-21 SURGICAL SUPPLY — 49 items
BAG COUNTER SPONGE SURGICOUNT (BAG) ×1 IMPLANT
BAG SPNG CNTER NS LX DISP (BAG) ×1
BANDAGE ESMARK 6X9 LF (GAUZE/BANDAGES/DRESSINGS) IMPLANT
BIT DRILL 110X2.5XQCK CNCT (BIT) IMPLANT
BIT DRILL 2.5 (BIT) ×1
BIT DRILL QC 110 3.5 (BIT) ×1
BIT DRILL QC 110 3.5MM (BIT) IMPLANT
BIT DRL 110X2.5XQCK CNCT (BIT) ×1
BNDG CMPR 9X6 STRL LF SNTH (GAUZE/BANDAGES/DRESSINGS)
BNDG COHESIVE 4X5 TAN STRL (GAUZE/BANDAGES/DRESSINGS) ×1 IMPLANT
BNDG ESMARK 6X9 LF (GAUZE/BANDAGES/DRESSINGS)
BNDG GAUZE 4.5X4.1 6PLY STRL (MISCELLANEOUS) IMPLANT
BNDG GAUZE ELAST 4 BULKY (GAUZE/BANDAGES/DRESSINGS) ×1 IMPLANT
COVER SURGICAL LIGHT HANDLE (MISCELLANEOUS) ×1 IMPLANT
DRAPE OEC MINIVIEW 54X84 (DRAPES) IMPLANT
DRAPE U-SHAPE 47X51 STRL (DRAPES) ×1 IMPLANT
DRILL BIT QC 110 3.5MM (BIT) ×1
DRSG ADAPTIC 3X8 NADH LF (GAUZE/BANDAGES/DRESSINGS) ×1 IMPLANT
DRSG PAD ABDOMINAL 8X10 ST (GAUZE/BANDAGES/DRESSINGS) ×1 IMPLANT
DURAPREP 26ML APPLICATOR (WOUND CARE) ×1 IMPLANT
ELECT REM PT RETURN 9FT ADLT (ELECTROSURGICAL) ×1
ELECTRODE REM PT RTRN 9FT ADLT (ELECTROSURGICAL) ×1 IMPLANT
GAUZE SPONGE 4X4 12PLY STRL (GAUZE/BANDAGES/DRESSINGS) ×1 IMPLANT
GLOVE BIOGEL PI IND STRL 9 (GLOVE) ×1 IMPLANT
GLOVE BIOGEL PI INDICATOR 9 (GLOVE) ×1
GLOVE SURG ORTHO 9.0 STRL STRW (GLOVE) ×1 IMPLANT
GOWN STRL REUS W/ TWL XL LVL3 (GOWN DISPOSABLE) ×3 IMPLANT
GOWN STRL REUS W/TWL XL LVL3 (GOWN DISPOSABLE) ×3
KIT BASIN OR (CUSTOM PROCEDURE TRAY) ×1 IMPLANT
KIT TURNOVER KIT B (KITS) ×1 IMPLANT
MANIFOLD NEPTUNE II (INSTRUMENTS) ×1 IMPLANT
NS IRRIG 1000ML POUR BTL (IV SOLUTION) ×1 IMPLANT
PACK ORTHO EXTREMITY (CUSTOM PROCEDURE TRAY) ×1 IMPLANT
PAD ABD 8X10 STRL (GAUZE/BANDAGES/DRESSINGS) IMPLANT
PAD ARMBOARD 7.5X6 YLW CONV (MISCELLANEOUS) ×2 IMPLANT
PLATE 7HOLE 1/3 TUBULAR (Plate) IMPLANT
SCREW CORTICAL 3.5 16MM (Screw) IMPLANT
SCREW CORTICAL 3.5 18MM (Screw) IMPLANT
SCREW CORTICAL 3.5X12 (Screw) IMPLANT
SCREW CORTICAL 3.5X14 (Screw) IMPLANT
STAPLER VISISTAT 35W (STAPLE) IMPLANT
SUCTION FRAZIER HANDLE 10FR (MISCELLANEOUS) ×1
SUCTION TUBE FRAZIER 10FR DISP (MISCELLANEOUS) ×1 IMPLANT
SUT ETHILON 2 0 PSLX (SUTURE) IMPLANT
SUT VIC AB 2-0 CT1 27 (SUTURE)
SUT VIC AB 2-0 CT1 TAPERPNT 27 (SUTURE) ×1 IMPLANT
TOWEL GREEN STERILE (TOWEL DISPOSABLE) ×1 IMPLANT
TOWEL GREEN STERILE FF (TOWEL DISPOSABLE) ×1 IMPLANT
TUBE CONNECTING 12X1/4 (SUCTIONS) ×1 IMPLANT

## 2022-05-21 NOTE — Transfer of Care (Signed)
Immediate Anesthesia Transfer of Care Note  Patient: Tammy [name]  Procedure(s) Performed: OPEN REDUCTION INTERNAL FIXATION (ORIF) LEFT DISTAL FIBULA FRACTURE (Left: Ankle)  Patient Location: PACU  Anesthesia Type:General  Level of Consciousness: awake, alert  and oriented  Airway & Oxygen Therapy: Patient connected to face mask oxygen  Post-op Assessment: Post -op Vital signs reviewed and stable  Post vital signs: stable  Last Vitals:  Vitals Value Taken Time  BP 154/119 05/21/22 1221  Temp    Pulse 101 05/21/22 1222  Resp 15 05/21/22 1222  SpO2 99 % 05/21/22 1222  Vitals shown include unvalidated device data.  Last Pain:  Vitals:   05/21/22 0914  TempSrc:   PainSc: 8          Complications: No notable events documented.

## 2022-05-21 NOTE — H&P (Signed)
Tammy [name] is an 61 y.o. female.   Chief Complaint: Left ankle pain. HPI: Patient is a 61 year old woman who is seen for [name] secondary to a fall at home.  Past Medical History:  Diagnosis Date   Anemia    Anxiety    Arthritis    neck and lower back - deg disc disease, fingers   Atrial fibrillation and flutter ([place])    COVID-19 2020   Depression    Frequency of urination    GERD (gastroesophageal reflux disease)    History of seizure per pt no seizure since    11-07-2015  in setting of stress and sleep deprivation  /  negative work-up by neurologist unknown idiology   Injury of thumb, left    03-31-2016     Pelvic relaxation    Pneumonia    SUI (stress urinary incontinence, female)     Past Surgical History:  Procedure Laterality Date   ANTERIOR AND POSTERIOR REPAIR  09/25/2012   Procedure: ANTERIOR (CYSTOCELE) AND POSTERIOR REPAIR (RECTOCELE);  Surgeon: Lahoma Crocker, MD;  Location: [facility];  Service: Gynecology;  Laterality: N/A;   ANTERIOR AND POSTERIOR REPAIR WITH SACROSPINOUS FIXATION N/A 04/12/2016   Procedure: ANTERIOR AND POSTERIOR REPAIR WITH SACROSPINOUS LIGAMENT SUSPENSION, CYSTOSCOPY;  Surgeon: Arvella Nigh, MD;  Location: [place];  Service: Gynecology;  Laterality: N/A;   APPENDECTOMY  1987   BREAST EXCISIONAL BIOPSY Left    No scar visable    BREAST REDUCTION SURGERY  2000  aprrox   ENDOMETRIAL ABLATION W/ NOVASURE  2011   in office   EXCISION LEFT BREAST BX  12-07-1999   benign   LAPAROSCOPIC CHOLECYSTECTOMY  02-09-2005   LAPAROSCOPIC VAGINAL HYSTERECTOMY WITH SALPINGO OOPHORECTOMY Bilateral 04/12/2016   Procedure: LAPAROSCOPIC ASSISTED VAGINAL HYSTERECTOMY WITH SALPINGO OOPHORECTOMY;  Surgeon: Arvella Nigh, MD;  Location: [place];  Service: Gynecology;  Laterality: Bilateral;   ORIF RADIAL FRACTURE Left 02/09/2021   Procedure: ORIF LEFT DISTAL RADIUS FRACTURE;  Surgeon:  Cindra Presume, MD;  Location: [place];  Service: Plastics;  Laterality: Left;  90 min   REDUCTION MAMMAPLASTY Bilateral    ROUX-EN-Y GASTRIC BYPASS  06/  2002   TUBAL LIGATION  10/ 1990    Family History  Problem Relation Age of Onset   Hypertension Mother    Hearing loss Mother    Arthritis Mother    Colon polyps Mother    Hearing loss Father    Arthritis Father    Heart disease Father 70       CABG   Depression Sister    Arthritis Sister    Depression Daughter    Arthritis Daughter    Alcohol abuse Daughter    Breast cancer Maternal Aunt    Diabetes Maternal Aunt        Type I   Stomach cancer Maternal Uncle    Colon cancer Maternal Uncle        x3   Diabetes Maternal Uncle        x5 - Type II   Stomach cancer Maternal Grandmother    Diabetes Maternal Grandmother    Heart disease Maternal Grandmother    Heart disease Paternal Grandmother    Esophageal cancer Neg Hx    Gallbladder disease Neg Hx    Seizures Neg Hx    Social History:  reports that she has never smoked. She has never used smokeless tobacco. She reports that she does  not drink alcohol and does not use drugs.  Allergies:  Allergies  Allergen Reactions   Nsaids Other (See Comments)    Post Gastric Bypass   Adhesive [Tape] Itching and Rash   Latex Itching, Rash and Other (See Comments)   Sulfa Antibiotics Itching and Rash    No medications prior to admission.    No results found for this or any previous visit (from the past 48 hour(s)). No results found.  Review of Systems  All other systems reviewed and are negative.   Height '5\' 6"'$  (1.676 m), weight 117.9 kg, last menstrual period 09/30/2012. Physical Exam  Patient is alert, oriented, no adenopathy, well-dressed, normal affect, normal respiratory effort. Examination patient has a palpable dorsalis pedis pulse there is ecchymosis and bruising laterally secondary to her Eliquis.  The radiographs shows a displaced Weber B fibular fracture with  shortening of the fibula and several millimeters of displacement.  The fracture is closed. Assessment/Plan 1. Closed fracture of left ankle, initial encounter       Plan: Discussed with the patient risks and benefits of surgery postoperative care.  Patient states she understands wished to proceed at this time.  She will continue her Eliquis.  Newt Minion, MD 05/21/2022, 6:49 AM

## 2022-05-21 NOTE — Anesthesia Procedure Notes (Addendum)
Procedure Name: LMA Insertion Date/Time: 05/21/2022 11:13 AM  Performed by: Leonor Liv, CRNAPre-anesthesia Checklist: Patient identified, Emergency Drugs available, Suction available and Patient being monitored Patient Re-evaluated:Patient Re-evaluated prior to induction Oxygen Delivery Method: Circle System Utilized Preoxygenation: Pre-oxygenation with 100% oxygen Induction Type: IV induction Ventilation: Mask ventilation without difficulty LMA: LMA inserted LMA Size: 4.0 Number of attempts: 1 Placement Confirmation: positive ETCO2 Tube secured with: Tape Dental Injury: Teeth and Oropharynx as per pre-operative assessment

## 2022-05-21 NOTE — Op Note (Signed)
05/21/2022  12:19 PM  PATIENT:  Tammy [name]    PRE-OPERATIVE DIAGNOSIS:  Left Distal Fibula Fracture  POST-OPERATIVE DIAGNOSIS:  Same  PROCEDURE:  OPEN REDUCTION INTERNAL FIXATION (ORIF) LEFT DISTAL FIBULA FRACTURE C-arm fluoroscopy to verify reduction.   SURGEON:  Newt Minion, MD  PHYSICIAN ASSISTANT:None ANESTHESIA:   General  PREOPERATIVE INDICATIONS:  Tammy [name] is a  61 y.o. female with a diagnosis of Left Distal Fibula Fracture who failed conservative measures and elected for surgical management.    The risks benefits and alternatives were discussed with the patient preoperatively including but not limited to the risks of infection, bleeding, nerve injury, cardiopulmonary complications, the need for revision surgery, among others, and the patient was willing to proceed.  OPERATIVE IMPLANTS: 7 hole one third tubular plate.  '@ENCIMAGES'$ @  OPERATIVE FINDINGS: See op fluoroscopy verified reduction both AP and lateral planes the fibula was out to length.  No widening of the mortise.  OPERATIVE PROCEDURE: Patient was brought to the operating room and underwent a general anesthetic.  After adequate levels anesthesia were obtained the left lower extremity was prepped using DuraPrep draped into a sterile field a timeout was called.  A lateral incision was made over the fibula and this was carried sharply down to bone.  Subperiosteal dissection was used to cleanse the fracture.  The fracture was open cleansed reduced and an interfrag 18 mm screw was placed to stabilize the fracture.  A neutralization plate was then placed laterally this was secured proximally with 2 cortical screws and distally with 2 cortical screws.  See arthroscopy verified reduction of the fibula.  The wound was irrigated with normal saline electrocautery was used hemostasis the incision was closed using 2-0 nylon a sterile dressing was applied patient was extubated taken the PACU in stable  condition.   DISCHARGE PLANNING:  Antibiotic duration: Preoperative antibiotics  Weightbearing: Touchdown weightbearing on the left  Pain medication: Patient has Vicodin  Dressing care/ Wound VAC: Dry dressing  Ambulatory devices: Kneeling scooter and crutches  Discharge to: Home.  Follow-up: In the office 1 week post operative.

## 2022-05-21 NOTE — Anesthesia Postprocedure Evaluation (Signed)
Anesthesia Post Note  Patient: Tammy [name]  Procedure(s) Performed: OPEN REDUCTION INTERNAL FIXATION (ORIF) LEFT DISTAL FIBULA FRACTURE (Left: Ankle)     Patient location during evaluation: PACU Anesthesia Type: General Level of consciousness: sedated Pain management: pain level controlled Vital Signs Assessment: post-procedure vital signs reviewed and stable Respiratory status: spontaneous breathing and respiratory function stable Cardiovascular status: stable Postop Assessment: no apparent nausea or vomiting Anesthetic complications: no Comments: Discussed increased HR with Dr. Percival Spanish who would like pt to double her metoprolol (now '25mg'$  BID).  His office will contact her on Monday.  Pt OK for discharge.   No notable events documented.  Last Vitals:  Vitals:   05/21/22 1248 05/21/22 1301  BP: 109/64 120/68  Pulse: (!) 103 96  Resp: 16 10  Temp:    SpO2: 94% 94%    Last Pain:  Vitals:   05/21/22 1301  TempSrc:   PainSc: 10-Worst pain ever                 Dason Mosley DANIEL

## 2022-05-21 NOTE — Interval H&P Note (Signed)
History and Physical Interval Note:  05/21/2022 10:24 AM  Tammy [name]  has presented today for surgery, with the diagnosis of Left Distal Fibula Fracture.  The various methods of treatment have been discussed with the patient and family. After consideration of risks, benefits and other options for treatment, the patient has consented to  Procedure(s): OPEN REDUCTION INTERNAL FIXATION (ORIF) LEFT DISTAL FIBULA FRACTURE (Left) as a surgical intervention.  The patient's history has been reviewed, patient examined, no change in status, stable for surgery.  I have reviewed the patient's chart and labs.  Questions were answered to the patient's satisfaction.     Newt Minion

## 2022-05-21 NOTE — Telephone Encounter (Signed)
Provider calling in regards to pt who is currently in office. States that he will call Dr. Percival Spanish since he is rounding. Please advise

## 2022-05-21 NOTE — Telephone Encounter (Signed)
Prescription refill request for Eliquis received. Indication: afib  Last office visit: 04/09/2022, Hochrein Scr: 0.77, 04/29/2022 Age: 61 yo  Weight: 117.9 kg

## 2022-05-21 NOTE — Telephone Encounter (Signed)
Provider is calling Dr. Percival Spanish

## 2022-05-23 NOTE — Progress Notes (Unsigned)
Office Visit    Patient Name: Tammy [name] Date of Encounter: 05/24/2022  Primary Care Provider:  Tawnya Crook, MD Primary Cardiologist:  Minus Breeding, MD Primary Electrophysiologist: None  Chief Complaint    Tammy [name] is a 61 y.o. female with PMH of atrial fibrillation (on Eliquis), HTN, obesity, OSA intolerant to CPAP, GERD who presents today for atrial fibrillation.  Past Medical History    Past Medical History:  Diagnosis Date   Anemia    Anxiety    Arthritis    neck and lower back - deg disc disease, fingers   Atrial fibrillation and flutter ([place])    COVID-19 2020   Depression    Frequency of urination    GERD (gastroesophageal reflux disease)    History of seizure per pt no seizure since    11-07-2015  in setting of stress and sleep deprivation  /  negative work-up by neurologist unknown idiology   Injury of thumb, left    03-31-2016     Pelvic relaxation    Pneumonia    SUI (stress urinary incontinence, female)    Past Surgical History:  Procedure Laterality Date   ANTERIOR AND POSTERIOR REPAIR  09/25/2012   Procedure: ANTERIOR (CYSTOCELE) AND POSTERIOR REPAIR (RECTOCELE);  Surgeon: Lahoma Crocker, MD;  Location: [facility];  Service: Gynecology;  Laterality: N/A;   ANTERIOR AND POSTERIOR REPAIR WITH SACROSPINOUS FIXATION N/A 04/12/2016   Procedure: ANTERIOR AND POSTERIOR REPAIR WITH SACROSPINOUS LIGAMENT SUSPENSION, CYSTOSCOPY;  Surgeon: Arvella Nigh, MD;  Location: [facility];  Service: Gynecology;  Laterality: N/A;   APPENDECTOMY  1987   BREAST EXCISIONAL BIOPSY Left    No scar visable    BREAST REDUCTION SURGERY  2000  aprrox   ENDOMETRIAL ABLATION W/ NOVASURE  2011   in office   EXCISION LEFT BREAST BX  12-07-1999   benign   LAPAROSCOPIC CHOLECYSTECTOMY  02-09-2005   LAPAROSCOPIC VAGINAL HYSTERECTOMY WITH SALPINGO OOPHORECTOMY Bilateral 04/12/2016   Procedure: LAPAROSCOPIC ASSISTED VAGINAL HYSTERECTOMY WITH SALPINGO  OOPHORECTOMY;  Surgeon: Arvella Nigh, MD;  Location: [facility];  Service: Gynecology;  Laterality: Bilateral;   ORIF RADIAL FRACTURE Left 02/09/2021   Procedure: ORIF LEFT DISTAL RADIUS FRACTURE;  Surgeon: Cindra Presume, MD;  Location: [facility];  Service: Plastics;  Laterality: Left;  90 min   REDUCTION MAMMAPLASTY Bilateral    ROUX-EN-Y GASTRIC BYPASS  06/  2002   TUBAL LIGATION  10/ 1990    Allergies  Allergies  Allergen Reactions   Nsaids Other (See Comments)    Post Gastric Bypass   Adhesive [Tape] Itching and Rash   Latex Itching, Rash and Other (See Comments)   Sulfa Antibiotics Itching and Rash    History of Present Illness    Tammy [name] is a 61 year old female with the above-mentioned past medical history who presents today for follow-up of atrial fibrillation.  Tammy [name] was initially seen by our practice in 07/2019 by Dr. Percival Spanish for new onset atrial fibrillation with RVR.  She was initially treated with IV heparin and Cardizem and converted and was started on Eliquis and p.o. Cardizem 300 mg.  She was last seen by Dr. Percival Spanish on 04/2022 and was doing well with very infrequent episodes of atrial fibrillation or sustained tachyarrhythmias.  She was admitted on 05/12/2022 for left ankle fracture that was repaired outpatient ORIF.  Patient developed AF with RVR during procedure and postprocedural head Toprol doubled to 25 mg twice daily.  Since last being seen in the office patient reports she has been doing well with no repeat bouts of atrial fibrillation.  She presents today with her husband for her follow-up.  Today she is sinus rhythm with heart rate of 73 bpm.  Her blood pressure today however is 90/60 which has been similar since increasing metoprolol.  Patient denies chest pain, palpitations, dyspnea, PND, orthopnea, nausea, vomiting, dizziness, syncope, edema, weight gain, or early satiety.  Home Medications    Current Outpatient Medications  Medication  Sig Dispense Refill   apixaban (ELIQUIS) 5 MG TABS tablet TAKE 1 TABLET BY MOUTH TWICE A DAY 180 tablet 1   buPROPion (WELLBUTRIN XL) 150 MG 24 hr tablet Take 2 tablets (300 mg total) by mouth daily.     cholecalciferol (VITAMIN D3) 25 MCG (1000 UNIT) tablet Take 1,000 Units by mouth daily.     ciprofloxacin (CIPRO) 500 MG tablet Take 1 tablet (500 mg total) by mouth every 12 (twelve) hours. (Patient not taking: Reported on 05/19/2022) 10 tablet 0   clonazePAM (KLONOPIN) 1 MG tablet Take 1 tablet (1 mg total) by mouth 2 (two) times daily. 30 tablet 0   cyanocobalamin (VITAMIN B12) 1000 MCG tablet Take 1,000 mcg by mouth daily.     dicyclomine (BENTYL) 10 MG capsule Take 1 capsule (10 mg total) by mouth 4 (four) times daily -  before meals and at bedtime. 20 capsule 0   diphenoxylate-atropine (LOMOTIL) 2.5-0.025 MG tablet Take 1 tablet by mouth 4 (four) times daily as needed for diarrhea or loose stools. 10 tablet 0   ferrous sulfate 325 (65 FE) MG tablet Take 325 mg by mouth daily.     FLUoxetine (PROZAC) 40 MG capsule Take 40 mg by mouth at bedtime.     gabapentin (NEURONTIN) 600 MG tablet Take 1.5 tablets (900 mg total) by mouth 3 (three) times daily. 540 tablet 1   HYDROcodone-acetaminophen (NORCO/VICODIN) 5-325 MG tablet Take 1 tablet by mouth every 6 (six) hours as needed for moderate pain. 30 tablet 0   Melatonin 10 MG TABS Take 10 mg by mouth at bedtime as needed (sleep).     metoprolol succinate (TOPROL-XL) 25 MG 24 hr tablet Take 1 tablet (25 mg total) by mouth at bedtime. 90 tablet 4   OLANZapine (ZYPREXA) 10 MG tablet Take 10 mg by mouth at bedtime.     ondansetron (ZOFRAN-ODT) 4 MG disintegrating tablet Take 1 tablet (4 mg total) by mouth every 8 (eight) hours as needed for nausea or vomiting. 20 tablet 0   oxyCODONE (ROXICODONE) 5 MG immediate release tablet Take 1 tablet (5 mg total) by mouth every 4 (four) hours as needed for severe pain. 8 tablet 0   oxyCODONE-acetaminophen  (PERCOCET/ROXICET) 5-325 MG tablet Take 1 tablet by mouth every 4 (four) hours as needed for severe pain. 30 tablet 0   potassium chloride SA (KLOR-CON M20) 20 MEQ tablet Take 1 tablet (20 mEq total) by mouth 2 (two) times daily. 180 tablet 1   promethazine (PHENERGAN) 25 MG tablet Take 1 tablet (25 mg total) by mouth every 8 (eight) hours as needed for nausea or vomiting. 20 tablet 0   No current facility-administered medications for this visit.     Review of Systems  Please see the history of present illness.    (+) Left foot discomfort   All other systems reviewed and are otherwise negative except as noted above.  Physical Exam    Wt Readings from Last 3 Encounters:  05/21/22 260 lb (117.9 kg)  05/12/22 260 lb (117.9 kg)  04/29/22 265 lb (120.2 kg)   ON:GEXBM were no vitals filed for this visit.,There is no height or weight on file to calculate BMI.  Constitutional:      Appearance: Healthy appearance. Not in distress.  Neck:     Vascular: JVD normal.  Pulmonary:     Effort: Pulmonary effort is normal.     Breath sounds: No wheezing. No rales. Diminished in the bases Cardiovascular:     Normal rate. Regular rhythm. Normal S1. Normal S2.      Murmurs: There is no murmur.  Edema:    Peripheral edema absent.  Abdominal:     Palpations: Abdomen is soft non tender. There is no hepatomegaly.  Skin:    General: Skin is warm and dry.  Neurological:     General: No focal deficit present.     Mental Status: Alert and oriented to person, place and time.     Cranial Nerves: Cranial nerves are intact.  EKG/LABS/Other Studies Reviewed    ECG personally reviewed by me today -normal sinus rhythm with rate of 73 bpm and no acute changes  Risk Assessment/Calculations:    CHA2DS2-VASc Score = 2   This indicates a 2.2% annual risk of stroke. The patient's score is based upon: CHF History: 0 HTN History: 1 Diabetes History: 0 Stroke History: 0 Vascular Disease History: 0 Age  Score: 0 Gender Score: 1   Lab Results  Component Value Date   WBC 8.9 04/29/2022   HGB 14.4 04/29/2022   HCT 43.8 04/29/2022   MCV 88.8 04/29/2022   PLT 248 04/29/2022   Lab Results  Component Value Date   CREATININE 0.77 04/29/2022   BUN 12 04/29/2022   NA 142 04/29/2022   K 4.0 04/29/2022   CL 105 04/29/2022   CO2 22 04/29/2022   Lab Results  Component Value Date   ALT 12 04/29/2022   AST 23 04/29/2022   ALKPHOS 65 04/29/2022   BILITOT 1.0 04/29/2022   Lab Results  Component Value Date   CHOL 173 12/02/2021   HDL 69.40 12/02/2021   LDLCALC 88 12/02/2021   TRIG 80.0 12/02/2021   CHOLHDL 2 12/02/2021    Lab Results  Component Value Date   HGBA1C 4.7 12/02/2021    Assessment & Plan    1.  Paroxysmal atrial fibrillation/flutter: -Patient developed AF with RVR during ORIF of distal fibula fracture. -Rate control with metoprolol increased to 25 mg twice daily however blood pressures have been labile and patient directed to reduce back to 25 mg daily with as needed dose for heart rate greater than 95. -Today patient is sinus rhythm with a rate of 71 -Patient reports no bleeding concerns with Eliquis -Continue Eliquis 5 mg twice daily -CHA2DS2-VASc Score = 2 [CHF History: 0, HTN History: 1, Diabetes History: 0, Stroke History: 0, Vascular Disease History: 0, Age Score: 0, Gender Score: 1].  Therefore, the patient's annual risk of stroke is 2.2 %.      2.  HTN: -Patient's blood pressure today was low at 98/60 -Continue antihypertensive regimen with metoprolol as noted above  3.  Obstructive sleep apnea: -Patient admits to noncompliance but states that she will try to become more compliant in the future   4.  Obesity: -BMI today is 42.93 kg/m -Patient was advised to lose weight and reduce calorie intake  Disposition: Follow-up with Minus Breeding, MD or APP in 12 months  Medication Adjustments/Labs and Tests Ordered: Current medicines are reviewed at  length with the patient today.  Concerns regarding medicines are outlined above.   Signed, Mable Fill, Marissa Nestle, NP 05/24/2022, 8:02 AM [place]

## 2022-05-24 ENCOUNTER — Encounter: Payer: Self-pay | Admitting: Nurse Practitioner

## 2022-05-24 ENCOUNTER — Ambulatory Visit: Payer: 59 | Admitting: Nurse Practitioner

## 2022-05-24 VITALS — BP 98/60 | HR 73 | Ht 66.0 in | Wt 266.0 lb

## 2022-05-24 DIAGNOSIS — I10 Essential (primary) hypertension: Secondary | ICD-10-CM | POA: Diagnosis not present

## 2022-05-24 DIAGNOSIS — G4733 Obstructive sleep apnea (adult) (pediatric): Secondary | ICD-10-CM

## 2022-05-24 DIAGNOSIS — I4891 Unspecified atrial fibrillation: Secondary | ICD-10-CM

## 2022-05-24 MED ORDER — METOPROLOL SUCCINATE ER 25 MG PO TB24: 25.0000 mg | ORAL_TABLET | Freq: Every day | ORAL | 2 refills | Status: DC

## 2022-05-24 NOTE — Patient Instructions (Signed)
Medication Instructions:  DECREASE Metoprolol to '25mg'$  Take daily as needed and you can take an additional '25mg'$  if your heartrate is greater than 90 *If you need a refill on your cardiac medications before your next appointment, please call your pharmacy*   Lab Work: None Ordered   Testing/Procedures: None Ordered   Follow-Up: At [facility], you and your health needs are our priority.  As part of our continuing mission to provide you with exceptional heart care, we have created designated Provider Care Teams.  These Care Teams include your primary Cardiologist (physician) and Advanced Practice Providers (APPs -  Physician Assistants and Nurse Practitioners) who all work together to provide you with the care you need, when you need it.  We recommend signing up for the patient portal called "MyChart".  Sign up information is provided on this After Visit Summary.  MyChart is used to connect with patients for Virtual Visits (Telemedicine).  Patients are able to view lab/test results, encounter notes, upcoming appointments, etc.  Non-urgent messages can be sent to your provider as well.   To learn more about what you can do with MyChart, go to [email or web address].    Your next appointment:   12 month(s)  The format for your next appointment:   In Person  Provider:   Minus Breeding, MD     Other Instructions   Important Information About Sugar

## 2022-05-25 ENCOUNTER — Telehealth: Payer: Self-pay

## 2022-05-25 NOTE — Telephone Encounter (Signed)
Left message on patient's and spouses' phone to return call.

## 2022-05-25 NOTE — Telephone Encounter (Signed)
LMTCB

## 2022-05-25 NOTE — Telephone Encounter (Deleted)
Left message on patient's and spouses' phone to return call.

## 2022-05-25 NOTE — Telephone Encounter (Signed)
-----   Message from Minus Breeding, MD sent at 05/25/2022  8:27 AM EDT ----- Regarding: FW: Office visit this week Hi,    This patient either needs an appt with me this week or an APP.  Thanks.  Jake ----- Message ----- From: Minus Breeding, MD Sent: 05/21/2022   5:27 PM EDT To: Minus Breeding, MD Subject: Office visit this week

## 2022-05-26 NOTE — Telephone Encounter (Signed)
Patient was seen on 8/21 by Ambrose Pancoast.

## 2022-05-27 ENCOUNTER — Other Ambulatory Visit: Payer: Self-pay | Admitting: Orthopedic Surgery

## 2022-05-27 ENCOUNTER — Telehealth: Payer: Self-pay | Admitting: Orthopedic Surgery

## 2022-05-27 MED ORDER — OXYCODONE-ACETAMINOPHEN 5-325 MG PO TABS: 1.0000 | ORAL_TABLET | ORAL | 0 refills | Status: DC | PRN

## 2022-05-27 NOTE — Telephone Encounter (Signed)
Pt is s/p a left ORIF distal fib fx 05/04/2022 she is requesting a refill on pain medication. Received rx for Norco 5/325 #30 05/18/2022, percocet 5/325 #30 on 05/20/2022 please advise.

## 2022-05-27 NOTE — Telephone Encounter (Signed)
[facility] requires prior auth through cover my meds this has been done and sent.

## 2022-05-27 NOTE — Telephone Encounter (Signed)
Patient would like refill on Oxycodone Please advise

## 2022-05-31 NOTE — Telephone Encounter (Signed)
Prior auth deemed NA as the pt's medication is on formulary however it will not authorize more than 2 refill per 29 days.

## 2022-06-01 ENCOUNTER — Encounter: Payer: Self-pay | Admitting: Family

## 2022-06-01 ENCOUNTER — Ambulatory Visit (INDEPENDENT_AMBULATORY_CARE_PROVIDER_SITE_OTHER): Payer: 59 | Admitting: Family

## 2022-06-01 ENCOUNTER — Ambulatory Visit: Payer: Self-pay

## 2022-06-01 DIAGNOSIS — S82892A Other fracture of left lower leg, initial encounter for closed fracture: Secondary | ICD-10-CM

## 2022-06-01 NOTE — Progress Notes (Signed)
Post-Op Visit Note   Patient: Tammy [name]           Date of Birth: 1961-03-13           MRN: [id number] Visit Date: 06/01/2022 PCP: Tawnya Crook, MD  Chief Complaint: No chief complaint on file.   HPI:  HPI The patient is a 61 year old woman who presents status post open reduction internal fixation left distal fibula fracture she has been in a cam walker nonweightbearing Ortho Exam Incision well approximated with sutures.  No gaping drainage no erythema no sign of infection  Visit Diagnoses:  1. Closed fracture of left ankle, initial encounter     Plan: Follow-up in office in 10 days suture removal anticipate advancing to weightbearing in her cam walker radiographs show stable alignment today. begin daily Dial soap cleansing.  Dry dressings.  Follow-Up Instructions: No follow-ups on file.   Imaging: No results found.  Orders:  Orders Placed This Encounter  Procedures   XR Ankle Complete Left   No orders of the defined types were placed in this encounter.    PMFS History: Patient Active Problem List   Diagnosis Date Noted   Closed displaced fracture of lateral malleolus of left fibula    History of gastric bypass 12/02/2021   Degenerative disc disease, lumbar 11/05/2021   Hallucinations 10/24/2021   Weakness 10/24/2021   AF (paroxysmal atrial fibrillation) ([place]) 10/24/2021   OSA (obstructive sleep apnea) 10/24/2021   Encephalopathy 09/17/2021   Hyponatremia 09/17/2021   Anemia 09/17/2021   Iron deficiency anemia 09/17/2021   Closed nondisplaced fracture of neck of right radius 07/21/2020   Somnolence    Aspiration pneumonia ([place]) 02/20/2020   Lactic acidosis 74/05/1447   Acute metabolic encephalopathy 18/56/3149   Sepsis ([place]) 02/20/2020   Chronic pain of both knees 01/08/2020   Encephalopathy acute 10/27/2019   Acute encephalopathy 10/27/2019   CAP (community acquired pneumonia) 07/31/2019   Atrial fibrillation with RVR ([place]) 07/31/2019   Morbid  obesity with BMI of 50.0-59.9, adult ([place]) 07/31/2019   Respiratory failure ([place]) 04/14/2019   Community acquired pneumonia 04/14/2019   Closed nondisplaced fracture of head of left radius 06/20/2018   Sprain of left wrist 06/20/2018   Peroneal tendinitis, right leg 06/23/2017   Trochanteric bursitis, right hip 06/23/2017   Acute right-sided low back pain with left-sided sciatica 03/24/2017   Pain in right hip 03/24/2017   Impingement syndrome of left shoulder 08/30/2016   Impingement syndrome of right shoulder 08/30/2016   Plantar fasciitis, bilateral 08/30/2016   Pelvic relaxation 04/13/2016    Class: Present on Admission   S/P hysterectomy 04/12/2016   Depression 11/16/2015   Generalized anxiety disorder 11/16/2015   Convulsion ([place]) 11/16/2015   SUI (stress urinary incontinence, female) 09/26/2012   Past Medical History:  Diagnosis Date   Anemia    Anxiety    Arthritis    neck and lower back - deg disc disease, fingers   Atrial fibrillation and flutter ([place])    COVID-19 2020   Depression    Frequency of urination    GERD (gastroesophageal reflux disease)    History of seizure per pt no seizure since    11-07-2015  in setting of stress and sleep deprivation  /  negative work-up by neurologist unknown idiology   Injury of thumb, left    03-31-2016     Pelvic relaxation    Pneumonia    SUI (stress urinary incontinence, female)     Family History  Problem Relation Age of Onset   Hypertension Mother    Hearing loss Mother    Arthritis Mother    Colon polyps Mother    Hearing loss Father    Arthritis Father    Heart disease Father 31       CABG   Depression Sister    Arthritis Sister    Depression Daughter    Arthritis Daughter    Alcohol abuse Daughter    Breast cancer Maternal Aunt    Diabetes Maternal Aunt        Type I   Stomach cancer Maternal Uncle    Colon cancer Maternal Uncle        x3   Diabetes Maternal Uncle        x5 - Type II   Stomach cancer  Maternal Grandmother    Diabetes Maternal Grandmother    Heart disease Maternal Grandmother    Heart disease Paternal Grandmother    Esophageal cancer Neg Hx    Gallbladder disease Neg Hx    Seizures Neg Hx     Past Surgical History:  Procedure Laterality Date   ANTERIOR AND POSTERIOR REPAIR  09/25/2012   Procedure: ANTERIOR (CYSTOCELE) AND POSTERIOR REPAIR (RECTOCELE);  Surgeon: Lahoma Crocker, MD;  Location: [facility];  Service: Gynecology;  Laterality: N/A;   ANTERIOR AND POSTERIOR REPAIR WITH SACROSPINOUS FIXATION N/A 04/12/2016   Procedure: ANTERIOR AND POSTERIOR REPAIR WITH SACROSPINOUS LIGAMENT SUSPENSION, CYSTOSCOPY;  Surgeon: Arvella Nigh, MD;  Location: [facility];  Service: Gynecology;  Laterality: N/A;   APPENDECTOMY  1987   BREAST EXCISIONAL BIOPSY Left    No scar visable    BREAST REDUCTION SURGERY  2000  aprrox   ENDOMETRIAL ABLATION W/ NOVASURE  2011   in office   EXCISION LEFT BREAST BX  12-07-1999   benign   LAPAROSCOPIC CHOLECYSTECTOMY  02-09-2005   LAPAROSCOPIC VAGINAL HYSTERECTOMY WITH SALPINGO OOPHORECTOMY Bilateral 04/12/2016   Procedure: LAPAROSCOPIC ASSISTED VAGINAL HYSTERECTOMY WITH SALPINGO OOPHORECTOMY;  Surgeon: Arvella Nigh, MD;  Location: [facility];  Service: Gynecology;  Laterality: Bilateral;   ORIF ANKLE FRACTURE Left 05/21/2022   Procedure: OPEN REDUCTION INTERNAL FIXATION (ORIF) LEFT DISTAL FIBULA FRACTURE;  Surgeon: Newt Minion, MD;  Location: [facility];  Service: Orthopedics;  Laterality: Left;   ORIF RADIAL FRACTURE Left 02/09/2021   Procedure: ORIF LEFT DISTAL RADIUS FRACTURE;  Surgeon: Cindra Presume, MD;  Location: [facility];  Service: Plastics;  Laterality: Left;  90 min   REDUCTION MAMMAPLASTY Bilateral    ROUX-EN-Y GASTRIC BYPASS  06/  2002   TUBAL LIGATION  10/ 1990   Social History   Occupational History   Occupation: Building control surveyor  Tobacco Use   Smoking status: Never   Smokeless tobacco: Never  Vaping Use    Vaping Use: Never used  Substance and Sexual Activity   Alcohol use: No    Comment: drinks daily - none since 08/2012 per pt.   Drug use: No   Sexual activity: Yes    Birth control/protection: Surgical

## 2022-06-02 ENCOUNTER — Ambulatory Visit (INDEPENDENT_AMBULATORY_CARE_PROVIDER_SITE_OTHER): Payer: 59 | Admitting: Family Medicine

## 2022-06-02 ENCOUNTER — Encounter: Payer: Self-pay | Admitting: Family Medicine

## 2022-06-02 VITALS — BP 122/60 | HR 84 | Temp 98.0°F | Ht 66.0 in | Wt 263.5 lb

## 2022-06-02 DIAGNOSIS — G8929 Other chronic pain: Secondary | ICD-10-CM

## 2022-06-02 DIAGNOSIS — F411 Generalized anxiety disorder: Secondary | ICD-10-CM | POA: Diagnosis not present

## 2022-06-02 DIAGNOSIS — M25562 Pain in left knee: Secondary | ICD-10-CM

## 2022-06-02 DIAGNOSIS — M25561 Pain in right knee: Secondary | ICD-10-CM

## 2022-06-02 DIAGNOSIS — I4891 Unspecified atrial fibrillation: Secondary | ICD-10-CM | POA: Diagnosis not present

## 2022-06-02 MED ORDER — GABAPENTIN 600 MG PO TABS: 900.0000 mg | ORAL_TABLET | Freq: Three times a day (TID) | ORAL | 1 refills | Status: AC

## 2022-06-02 NOTE — Progress Notes (Addendum)
Subjective:     Patient ID: Tammy [name], female    DOB: 07/19/1961, 61 y.o.   MRN: [id number]  Chief Complaint  Patient presents with   Follow-up    6 month follow-up on afib and meds     HPI  Afib-doing well on toprol and eliquis   went out of control w/surgery.  HR better now.  No bleeding.   When inc metoprolol, bp too low so on 1/d again. Depression/Anx-doing well on meds.  No SI.  Seeing MH  1 wk ago tripped over dog's steps and Fx. L ankle-repaired-on scooter for 3 more weeks.    Went to ER prior for V/d  Health Maintenance Due  Topic Date Due   COLONOSCOPY (Pts 45-58yr Insurance coverage will need to be confirmed)  Never done    Past Medical History:  Diagnosis Date   Anemia    Anxiety    Arthritis    neck and lower back - deg disc disease, fingers   [place]    COVID-19 2020   Depression    Frequency of urination    GERD (gastroesophageal reflux disease)    History of seizure per pt no seizure since    11-07-2015  in setting of stress and sleep deprivation  /  negative work-up by neurologist unknown idiology   Injury of thumb, left    03-31-2016     Pelvic relaxation    Pneumonia    SUI (stress urinary incontinence, female)     Past Surgical History:  Procedure Laterality Date   ANTERIOR AND POSTERIOR REPAIR  09/25/2012   Procedure: ANTERIOR (CYSTOCELE) AND POSTERIOR REPAIR (RECTOCELE);  Surgeon: LLahoma Crocker MD;  [facility];  Service: Gynecology;  Laterality: N/A;   ANTERIOR AND POSTERIOR REPAIR WITH SACROSPINOUS FIXATION N/A 04/12/2016   Procedure: ANTERIOR AND POSTERIOR REPAIR WITH SACROSPINOUS LIGAMENT SUSPENSION, CYSTOSCOPY;  Surgeon: JArvella Nigh MD;  [facility]  Service: Gynecology;  Laterality: N/A;   APPENDECTOMY  1987   BREAST EXCISIONAL BIOPSY Left    No scar visable    BREAST REDUCTION SURGERY  2000  aprrox   ENDOMETRIAL ABLATION W/ NOVASURE  2011   in office   EXCISION  LEFT BREAST BX  12-07-1999   benign   LAPAROSCOPIC CHOLECYSTECTOMY  02-09-2005   LAPAROSCOPIC VAGINAL HYSTERECTOMY WITH SALPINGO OOPHORECTOMY Bilateral 04/12/2016   Procedure: LAPAROSCOPIC ASSISTED VAGINAL HYSTERECTOMY WITH SALPINGO OOPHORECTOMY;  Surgeon: JArvella Nigh MD;  [facility]  Service: Gynecology;  Laterality: Bilateral;   ORIF ANKLE FRACTURE Left 05/21/2022   Procedure: OPEN REDUCTION INTERNAL FIXATION (ORIF) LEFT DISTAL FIBULA FRACTURE;  Surgeon: DNewt Minion MD;  [facility]  Service: Orthopedics;  Laterality: Left;   ORIF RADIAL FRACTURE Left 02/09/2021   Procedure: ORIF LEFT DISTAL RADIUS FRACTURE;  Surgeon: PCindra Presume MD;  [facility]  Service: Plastics;  Laterality: Left;  90 min   REDUCTION MAMMAPLASTY Bilateral    ROUX-EN-Y GASTRIC BYPASS  06/  2002   TUBAL LIGATION  10/ 1990    Outpatient Medications Prior to Visit  Medication Sig Dispense Refill   apixaban (ELIQUIS) 5 MG TABS tablet TAKE 1 TABLET BY MOUTH TWICE A DAY 180 tablet 1   buPROPion (WELLBUTRIN XL) 150 MG 24 hr tablet Take 2 tablets (300 mg total) by mouth daily.     cholecalciferol (VITAMIN D3) 25 MCG (1000 UNIT) tablet Take 1,000 Units by mouth daily.  clonazePAM (KLONOPIN) 1 MG tablet Take 1 tablet (1 mg total) by mouth 2 (two) times daily. 30 tablet 0   cyanocobalamin (VITAMIN B12) 1000 MCG tablet Take 1,000 mcg by mouth daily.     diphenoxylate-atropine (LOMOTIL) 2.5-0.025 MG tablet Take 1 tablet by mouth 4 (four) times daily as needed for diarrhea or loose stools. 10 tablet 0   ferrous sulfate 325 (65 FE) MG tablet Take 325 mg by mouth daily.     FLUoxetine (PROZAC) 40 MG capsule Take 40 mg by mouth at bedtime.     Melatonin 10 MG TABS Take 10 mg by mouth at bedtime as needed (sleep).     metoprolol succinate (TOPROL-XL) 25 MG 24 hr tablet Take 1 tablet (25 mg total) by mouth at bedtime. Take if heart rate is greater than 90 90 tablet 2   OLANZapine (ZYPREXA) 10  MG tablet Take 10 mg by mouth at bedtime.     potassium chloride SA (KLOR-CON M20) 20 MEQ tablet Take 1 tablet (20 mEq total) by mouth 2 (two) times daily. 180 tablet 1   gabapentin (NEURONTIN) 600 MG tablet Take 1.5 tablets (900 mg total) by mouth 3 (three) times daily. 540 tablet 1   ondansetron (ZOFRAN-ODT) 4 MG disintegrating tablet Take 1 tablet (4 mg total) by mouth every 8 (eight) hours as needed for nausea or vomiting. 20 tablet 0   oxyCODONE (ROXICODONE) 5 MG immediate release tablet Take 1 tablet (5 mg total) by mouth every 4 (four) hours as needed for severe pain. 8 tablet 0   oxyCODONE-acetaminophen (PERCOCET/ROXICET) 5-325 MG tablet Take 1 tablet by mouth every 4 (four) hours as needed for severe pain. 30 tablet 0   promethazine (PHENERGAN) 25 MG tablet Take 1 tablet (25 mg total) by mouth every 8 (eight) hours as needed for nausea or vomiting. 20 tablet 0   ciprofloxacin (CIPRO) 500 MG tablet Take 1 tablet (500 mg total) by mouth every 12 (twelve) hours. (Patient not taking: Reported on 06/02/2022) 10 tablet 0   dicyclomine (BENTYL) 10 MG capsule Take 1 capsule (10 mg total) by mouth 4 (four) times daily -  before meals and at bedtime. (Patient not taking: Reported on 06/02/2022) 20 capsule 0   HYDROcodone-acetaminophen (NORCO/VICODIN) 5-325 MG tablet Take 1 tablet by mouth every 6 (six) hours as needed for moderate pain. (Patient not taking: Reported on 06/02/2022) 30 tablet 0   No facility-administered medications prior to visit.    Allergies  Allergen Reactions   Nsaids Other (See Comments)    Post Gastric Bypass   Adhesive [Tape] Itching and Rash   Latex Itching, Rash and Other (See Comments)   Sulfa Antibiotics Itching and Rash   ROS neg/noncontributory except as noted HPI/below L knee pain since on scooter.      Objective:     BP 122/60   Pulse 84   Temp 98 F (36.7 C) (Temporal)   Ht '5\' 6"'$  (1.676 m)   Wt 263 lb 8 oz (119.5 kg)   LMP 09/30/2012   SpO2 96%   BMI  42.53 kg/m  Wt Readings from Last 3 Encounters:  06/02/22 263 lb 8 oz (119.5 kg)  05/24/22 266 lb (120.7 kg)  05/21/22 260 lb (117.9 kg)    Physical Exam   Gen: WDWN NAD HEENT: NCAT, conjunctiva not injected, sclera nonicteric NECK:  supple, no thyromegaly, no nodes, no carotid bruits CARDIAC: RRR, S1S2+, no murmur. DP 2+B LUNGS: CTAB. No wheezes ABDOMEN:  BS+, soft, NTND, No  HSM, no masses EXT:  no edema MSK: in boot L and scooter. NEURO: A&O x3.  CN II-XII intact.  PSYCH: flat affect. Good eye contact  Hasn't sent in cologard yet.- advised to do Reviewed labs in chart     Assessment & Plan:   Problem List Items Addressed This Visit       Cardiovascular and Mediastinum   Atrial fibrillation with RVR (HCC) - Primary     Other   Generalized anxiety disorder   Chronic pain of both knees   Relevant Medications   gabapentin (NEURONTIN) 600 MG tablet   Afib-chronic/stable.  Cont metoprolol,eliquis,K-managed by Card Depression/anxiety-chronic/stable on meds.  Managed by MH. Chronic pain of B knees-L a little worse since fx ankel-cont gabapentin '900mg'$  tid and can do voltaren gel(I'm avoid narcs for mult reasons-meds, etc).  Can't do PO NSAID's d/t DOAC.   F/u annual and prn  10/21/22-got notification from [facility] teeter-pt getting gabapentin from [facility] as well(psych).   Meds ordered this encounter  Medications   gabapentin (NEURONTIN) 600 MG tablet    Sig: Take 1.5 tablets (900 mg total) by mouth 3 (three) times daily.    Dispense:  540 tablet    Refill:  1    Wellington Hampshire, MD

## 2022-06-02 NOTE — Patient Instructions (Signed)
It was very nice to see you today!  Use voltaren gel on L knee   PLEASE NOTE:  If you had any lab tests please let us know if you have not heard back within a few days. You may see your results on MyChart before we have a chance to review them but we will give you a call once they are reviewed by [place]. If we ordered any referrals today, please let us know if you have not heard from their office within the next week.   Please try these tips to maintain a healthy lifestyle:  Eat most of your calories during the day when you are active. Eliminate processed foods including packaged sweets (pies, cakes, cookies), reduce intake of potatoes, white bread, white pasta, and white rice. Look for whole grain options, oat flour or almond flour.  Each meal should contain half fruits/vegetables, one quarter protein, and one quarter carbs (no bigger than a computer mouse).  Cut down on sweet beverages. This includes juice, soda, and sweet tea. Also watch fruit intake, though this is a healthier sweet option, it still contains natural sugar! Limit to 3 servings daily.  Drink at least 1 glass of water with each meal and aim for at least 8 glasses per day  Exercise at least 150 minutes every week.

## 2022-06-04 ENCOUNTER — Ambulatory Visit: Payer: 59 | Admitting: Family Medicine

## 2022-06-04 ENCOUNTER — Telehealth: Payer: Self-pay | Admitting: Orthopedic Surgery

## 2022-06-04 NOTE — Telephone Encounter (Signed)
Pt has received a total of 98 tablets of pain meds for the month of August. 05/13/22 oxy '5mg'$  #8, 05/18/22 hydrocodone #30, 8/17 oxy 5-'325mg'$  #30, and 8/24 oxycodone 5-'325mg'$  #30. Her insurance will only allow 2 refills within 29 day period.

## 2022-06-04 NOTE — Telephone Encounter (Signed)
Patient called. She would like a refill on oxycodone. Her call back number is [phone]

## 2022-06-10 ENCOUNTER — Telehealth: Payer: Self-pay | Admitting: Orthopedic Surgery

## 2022-06-10 NOTE — Telephone Encounter (Signed)
05/21/2022 left ankle ORIF pt requesting refill on Oxycodone 5/325. Last refill was 05/27/2022 #30 did rec 98 total last month please advise.

## 2022-06-10 NOTE — Telephone Encounter (Signed)
Refill on oxycodone

## 2022-06-11 MED ORDER — OXYCODONE-ACETAMINOPHEN 5-325 MG PO TABS: 1.0000 | ORAL_TABLET | Freq: Four times a day (QID) | ORAL | 0 refills | Status: DC | PRN

## 2022-06-11 NOTE — Addendum Note (Signed)
Addended by: Dondra Prader R on: 06/11/2022 05:44 AM   Modules accepted: Orders

## 2022-06-18 ENCOUNTER — Telehealth: Payer: Self-pay | Admitting: Orthopedic Surgery

## 2022-06-18 NOTE — Telephone Encounter (Signed)
Patient called. She would like oxycodone called in for her. Her call back number is [phone]

## 2022-06-18 NOTE — Telephone Encounter (Signed)
05/21/2022 s/p ORIF left ankle fx requesting refill on pain medication. Last refill was oxycodone 5/325 #30 on 06/11/2022 please advise.

## 2022-06-19 ENCOUNTER — Other Ambulatory Visit: Payer: Self-pay | Admitting: Orthopedic Surgery

## 2022-06-19 MED ORDER — OXYCODONE-ACETAMINOPHEN 5-325 MG PO TABS: 1.0000 | ORAL_TABLET | Freq: Four times a day (QID) | ORAL | 0 refills | Status: DC | PRN

## 2022-06-22 ENCOUNTER — Ambulatory Visit (INDEPENDENT_AMBULATORY_CARE_PROVIDER_SITE_OTHER): Payer: Commercial Managed Care - HMO | Admitting: Orthopedic Surgery

## 2022-06-22 ENCOUNTER — Encounter: Payer: Self-pay | Admitting: Orthopedic Surgery

## 2022-06-22 DIAGNOSIS — S82892A Other fracture of left lower leg, initial encounter for closed fracture: Secondary | ICD-10-CM

## 2022-06-22 MED ORDER — DOXYCYCLINE HYCLATE 100 MG PO TABS: 100.0000 mg | ORAL_TABLET | Freq: Two times a day (BID) | ORAL | 0 refills | Status: DC

## 2022-06-22 NOTE — Progress Notes (Signed)
Office Visit Note   Patient: Tammy [name]           Date of Birth: May 08, 1961           MRN: [id number] Visit Date: 06/22/2022              Requested by: Tawnya Crook, MD [place] [place],  [place] [address] PCP: Tawnya Crook, MD  Chief Complaint  Patient presents with   Left Ankle - Routine Post Op    05/21/2022 ORIF left distal fib fx       HPI: Patient is 4 weeks status post open reduction internal fixation left distal fibula.  Patient is full weightbearing with a fracture boot and no assistive devices.  Assessment & Plan: Visit Diagnoses:  1. Closed fracture of left ankle, initial encounter     Plan: Prescription called in for doxycycline.  She will start Dial soap cleansing.  Recommended minimizing weightbearing, elevation, with daily dressing changes.  Follow-Up Instructions: Return in about 1 week (around 06/29/2022).   Ortho Exam  Patient is alert, oriented, no adenopathy, well-dressed, normal affect, normal respiratory effort. Examination there is dehiscence of the wound distally there is some redness its not clear if this is from swelling or infection.  The wound bed is debrided there is good healthy granulation tissue this measures a centimeter in diameter.  She has a strong palpable dorsalis pedis pulse.  Sutures are harvested.  Imaging: No results found. No images are attached to the encounter.  Labs: Lab Results  Component Value Date   HGBA1C 4.7 12/02/2021   HGBA1C 5.3 08/01/2019   CRP 1.6 (H) 04/14/2019   REPTSTATUS 05/01/2022 FINAL 04/29/2022   CULT >=100,000 COLONIES/mL KLEBSIELLA PNEUMONIAE (A) 04/29/2022   LABORGA KLEBSIELLA PNEUMONIAE (A) 04/29/2022     Lab Results  Component Value Date   ALBUMIN 4.6 04/29/2022   ALBUMIN 4.4 02/14/2022   ALBUMIN 3.9 12/02/2021    Lab Results  Component Value Date   MG 2.2 12/02/2021   MG 2.0 10/24/2021   MG 2.5 (H) 10/28/2019   Lab Results  Component Value Date   VD25OH 21.40  (L) 12/02/2021    No results found for: "PREALBUMIN"    Latest Ref Rng & Units 04/29/2022    7:23 AM 02/14/2022    1:33 PM 02/14/2022    1:18 PM  CBC EXTENDED  WBC 4.0 - 10.5 K/uL 8.9  8.5    RBC 3.87 - 5.11 MIL/uL 4.93  4.99    Hemoglobin 12.0 - 15.0 g/dL 14.4  13.8  15.0   HCT 36.0 - 46.0 % 43.8  44.5  44.0   Platelets 150 - 400 K/uL 248  199    NEUT# 1.7 - 7.7 K/uL 6.8  6.1    Lymph# 0.7 - 4.0 K/uL 1.6  1.6       There is no height or weight on file to calculate BMI.  Orders:  No orders of the defined types were placed in this encounter.  Meds ordered this encounter  Medications   doxycycline (VIBRA-TABS) 100 MG tablet    Sig: Take 1 tablet (100 mg total) by mouth 2 (two) times daily.    Dispense:  30 tablet    Refill:  0     Procedures: No procedures performed  Clinical Data: No additional findings.  ROS:  All other systems negative, except as noted in the HPI. Review of Systems  Objective: Vital Signs: LMP 09/30/2012   Specialty Comments:  No specialty comments available.  PMFS History: Patient Active Problem List   Diagnosis Date Noted   Closed displaced fracture of lateral malleolus of left fibula    History of gastric bypass 12/02/2021   Degenerative disc disease, lumbar 11/05/2021   Hallucinations 10/24/2021   Weakness 10/24/2021   AF (paroxysmal atrial fibrillation) ([place]) 10/24/2021   OSA (obstructive sleep apnea) 10/24/2021   Encephalopathy 09/17/2021   Hyponatremia 09/17/2021   Anemia 09/17/2021   Iron deficiency anemia 09/17/2021   Closed nondisplaced fracture of neck of right radius 07/21/2020   Somnolence    Aspiration pneumonia ([place]) 02/20/2020   Lactic acidosis 10/62/6948   Acute metabolic encephalopathy 54/62/7035   Sepsis ([place]) 02/20/2020   Chronic pain of both knees 01/08/2020   Encephalopathy acute 10/27/2019   Acute encephalopathy 10/27/2019   CAP (community acquired pneumonia) 07/31/2019   Atrial fibrillation with RVR ([place])  07/31/2019   Morbid obesity with BMI of 50.0-59.9, adult ([place]) 07/31/2019   Respiratory failure ([place]) 04/14/2019   Community acquired pneumonia 04/14/2019   Closed nondisplaced fracture of head of left radius 06/20/2018   Sprain of left wrist 06/20/2018   Peroneal tendinitis, right leg 06/23/2017   Trochanteric bursitis, right hip 06/23/2017   Acute right-sided low back pain with left-sided sciatica 03/24/2017   Pain in right hip 03/24/2017   Impingement syndrome of left shoulder 08/30/2016   Impingement syndrome of right shoulder 08/30/2016   Plantar fasciitis, bilateral 08/30/2016   Pelvic relaxation 04/13/2016    Class: Present on Admission   S/P hysterectomy 04/12/2016   Depression 11/16/2015   Generalized anxiety disorder 11/16/2015   Convulsion ([place]) 11/16/2015   SUI (stress urinary incontinence, female) 09/26/2012   Past Medical History:  Diagnosis Date   Anemia    Anxiety    Arthritis    neck and lower back - deg disc disease, fingers   Atrial fibrillation and flutter ([place])    COVID-19 2020   Depression    Frequency of urination    GERD (gastroesophageal reflux disease)    History of seizure per pt no seizure since    11-07-2015  in setting of stress and sleep deprivation  /  negative work-up by neurologist unknown idiology   Injury of thumb, left    03-31-2016     Pelvic relaxation    Pneumonia    SUI (stress urinary incontinence, female)     Family History  Problem Relation Age of Onset   Hypertension Mother    Hearing loss Mother    Arthritis Mother    Colon polyps Mother    Hearing loss Father    Arthritis Father    Heart disease Father 49       CABG   Depression Sister    Arthritis Sister    Depression Daughter    Arthritis Daughter    Alcohol abuse Daughter    Breast cancer Maternal Aunt    Diabetes Maternal Aunt        Type I   Stomach cancer Maternal Uncle    Colon cancer Maternal Uncle        x3   Diabetes Maternal Uncle        x5 - Type  II   Stomach cancer Maternal Grandmother    Diabetes Maternal Grandmother    Heart disease Maternal Grandmother    Heart disease Paternal Grandmother    Esophageal cancer Neg Hx    Gallbladder disease Neg Hx    Seizures Neg Hx  Past Surgical History:  Procedure Laterality Date   ANTERIOR AND POSTERIOR REPAIR  09/25/2012   Procedure: ANTERIOR (CYSTOCELE) AND POSTERIOR REPAIR (RECTOCELE);  Surgeon: Lahoma Crocker, MD;  Location: [facility];  Service: Gynecology;  Laterality: N/A;   ANTERIOR AND POSTERIOR REPAIR WITH SACROSPINOUS FIXATION N/A 04/12/2016   Procedure: ANTERIOR AND POSTERIOR REPAIR WITH SACROSPINOUS LIGAMENT SUSPENSION, CYSTOSCOPY;  Surgeon: Arvella Nigh, MD;  Location: [facility];  Service: Gynecology;  Laterality: N/A;   APPENDECTOMY  1987   BREAST EXCISIONAL BIOPSY Left    No scar visable    BREAST REDUCTION SURGERY  2000  aprrox   ENDOMETRIAL ABLATION W/ NOVASURE  2011   in office   EXCISION LEFT BREAST BX  12-07-1999   benign   LAPAROSCOPIC CHOLECYSTECTOMY  02-09-2005   LAPAROSCOPIC VAGINAL HYSTERECTOMY WITH SALPINGO OOPHORECTOMY Bilateral 04/12/2016   Procedure: LAPAROSCOPIC ASSISTED VAGINAL HYSTERECTOMY WITH SALPINGO OOPHORECTOMY;  Surgeon: Arvella Nigh, MD;  Location: [facility];  Service: Gynecology;  Laterality: Bilateral;   ORIF ANKLE FRACTURE Left 05/21/2022   Procedure: OPEN REDUCTION INTERNAL FIXATION (ORIF) LEFT DISTAL FIBULA FRACTURE;  Surgeon: Newt Minion, MD;  Location: [place] [facility];  Service: Orthopedics;  Laterality: Left;   ORIF RADIAL FRACTURE Left 02/09/2021   Procedure: ORIF LEFT DISTAL RADIUS FRACTURE;  Surgeon: Cindra Presume, MD;  Location: [facility];  Service: Plastics;  Laterality: Left;  90 min   REDUCTION MAMMAPLASTY Bilateral    ROUX-EN-Y GASTRIC BYPASS  06/  2002   TUBAL LIGATION  10/ 1990   Social History   Occupational History   Occupation: Building control surveyor  Tobacco Use   Smoking status: Never   Smokeless tobacco:  Never  Vaping Use   Vaping Use: Never used  Substance and Sexual Activity   Alcohol use: No    Comment: drinks daily - none since 08/2012 per pt.   Drug use: No   Sexual activity: Yes    Birth control/protection: Surgical

## 2022-06-24 ENCOUNTER — Other Ambulatory Visit: Payer: Self-pay | Admitting: Orthopedic Surgery

## 2022-06-24 ENCOUNTER — Telehealth: Payer: Self-pay | Admitting: Orthopedic Surgery

## 2022-06-24 MED ORDER — OXYCODONE-ACETAMINOPHEN 5-325 MG PO TABS: 1.0000 | ORAL_TABLET | Freq: Four times a day (QID) | ORAL | 0 refills | Status: DC | PRN

## 2022-06-24 NOTE — Telephone Encounter (Signed)
Oxycodone last filled 06/19/22 #30, q6h

## 2022-06-24 NOTE — Telephone Encounter (Signed)
Patient wants refill on oxycodone 5-325 mg. [facility] [phone]

## 2022-06-25 NOTE — Telephone Encounter (Signed)
Lm on vm per DPR okay

## 2022-06-28 ENCOUNTER — Telehealth: Payer: Self-pay | Admitting: Orthopedic Surgery

## 2022-06-28 NOTE — Telephone Encounter (Signed)
Pt did not pick up the rx that was written on 06/24/2022. Please see message below and advise. Thanks!

## 2022-06-28 NOTE — Telephone Encounter (Signed)
Pt called requesting to resend refill of oxycodone. Pt states pharmacy has not gotten the request. Please resend [phone] to [facility]. Pt phone number is

## 2022-06-29 ENCOUNTER — Ambulatory Visit: Payer: Commercial Managed Care - HMO | Admitting: Family

## 2022-06-29 DIAGNOSIS — S8262XD Displaced fracture of lateral malleolus of left fibula, subsequent encounter for closed fracture with routine healing: Secondary | ICD-10-CM

## 2022-06-30 ENCOUNTER — Encounter: Payer: Self-pay | Admitting: Family

## 2022-06-30 MED ORDER — OXYCODONE-ACETAMINOPHEN 5-325 MG PO TABS: 1.0000 | ORAL_TABLET | Freq: Three times a day (TID) | ORAL | 0 refills | Status: DC | PRN

## 2022-06-30 NOTE — Progress Notes (Signed)
Post-Op Visit Note   Patient: Tammy [name]           Date of Birth: 1960-11-18           MRN: [id number] Visit Date: 06/29/2022 PCP: Tawnya Crook, MD  Chief Complaint:  Chief Complaint  Patient presents with   Left Ankle - Routine Post Op    05/21/2022 ORIF left distal fib fx     HPI:  HPI The patient is a 61 year old woman seen status post open reduction internal fixation of left fibula fracture.  She has been full weightbearing in a cam walker.  Has been on doxycycline doing daily dose of cleansing and dry dressings minimizing her weightbearing.  Did have some dehiscence of her incision distally Ortho Exam On examination of the left ankle there is dehiscence of 25 mm of the incision this is open about 8 mm in width.  There is a central fissure there is no active drainage no surrounding erythema no maceration  Visit Diagnoses: No diagnosis found.  Plan: Given silver cell for packing.  She will continue with her doxycycline daily Dial soap cleansing and packed open with silver cell cover with gauze  Follow-Up Instructions: Return in about 2 weeks (around 07/13/2022).   Imaging: No results found.  Orders:  No orders of the defined types were placed in this encounter.  No orders of the defined types were placed in this encounter.    PMFS History: Patient Active Problem List   Diagnosis Date Noted   Closed displaced fracture of lateral malleolus of left fibula    History of gastric bypass 12/02/2021   Degenerative disc disease, lumbar 11/05/2021   Hallucinations 10/24/2021   Weakness 10/24/2021   AF (paroxysmal atrial fibrillation) ([place]) 10/24/2021   OSA (obstructive sleep apnea) 10/24/2021   Encephalopathy 09/17/2021   Hyponatremia 09/17/2021   Anemia 09/17/2021   Iron deficiency anemia 09/17/2021   Closed nondisplaced fracture of neck of right radius 07/21/2020   Somnolence    Aspiration pneumonia ([place]) 02/20/2020   Lactic acidosis 41/74/0814   Acute  metabolic encephalopathy 48/18/5631   Sepsis ([place]) 02/20/2020   Chronic pain of both knees 01/08/2020   Encephalopathy acute 10/27/2019   Acute encephalopathy 10/27/2019   CAP (community acquired pneumonia) 07/31/2019   Atrial fibrillation with RVR ([place]) 07/31/2019   Morbid obesity with BMI of 50.0-59.9, adult ([place]) 07/31/2019   Respiratory failure ([place]) 04/14/2019   Community acquired pneumonia 04/14/2019   Closed nondisplaced fracture of head of left radius 06/20/2018   Sprain of left wrist 06/20/2018   Peroneal tendinitis, right leg 06/23/2017   Trochanteric bursitis, right hip 06/23/2017   Acute right-sided low back pain with left-sided sciatica 03/24/2017   Pain in right hip 03/24/2017   Impingement syndrome of left shoulder 08/30/2016   Impingement syndrome of right shoulder 08/30/2016   Plantar fasciitis, bilateral 08/30/2016   Pelvic relaxation 04/13/2016    Class: Present on Admission   S/P hysterectomy 04/12/2016   Depression 11/16/2015   Generalized anxiety disorder 11/16/2015   Convulsion ([place]) 11/16/2015   SUI (stress urinary incontinence, female) 09/26/2012   Past Medical History:  Diagnosis Date   Anemia    Anxiety    Arthritis    neck and lower back - deg disc disease, fingers   Atrial fibrillation and flutter ([place])    COVID-19 2020   Depression    Frequency of urination    GERD (gastroesophageal reflux disease)    History of seizure per pt  no seizure since    11-07-2015  in setting of stress and sleep deprivation  /  negative work-up by neurologist unknown idiology   Injury of thumb, left    03-31-2016     Pelvic relaxation    Pneumonia    SUI (stress urinary incontinence, female)     Family History  Problem Relation Age of Onset   Hypertension Mother    Hearing loss Mother    Arthritis Mother    Colon polyps Mother    Hearing loss Father    Arthritis Father    Heart disease Father 66       CABG   Depression Sister    Arthritis Sister     Depression Daughter    Arthritis Daughter    Alcohol abuse Daughter    Breast cancer Maternal Aunt    Diabetes Maternal Aunt        Type I   Stomach cancer Maternal Uncle    Colon cancer Maternal Uncle        x3   Diabetes Maternal Uncle        x5 - Type II   Stomach cancer Maternal Grandmother    Diabetes Maternal Grandmother    Heart disease Maternal Grandmother    Heart disease Paternal Grandmother    Esophageal cancer Neg Hx    Gallbladder disease Neg Hx    Seizures Neg Hx     Past Surgical History:  Procedure Laterality Date   ANTERIOR AND POSTERIOR REPAIR  09/25/2012   Procedure: ANTERIOR (CYSTOCELE) AND POSTERIOR REPAIR (RECTOCELE);  Surgeon: Lahoma Crocker, MD;  Location: [facility];  Service: Gynecology;  Laterality: N/A;   ANTERIOR AND POSTERIOR REPAIR WITH SACROSPINOUS FIXATION N/A 04/12/2016   Procedure: ANTERIOR AND POSTERIOR REPAIR WITH SACROSPINOUS LIGAMENT SUSPENSION, CYSTOSCOPY;  Surgeon: Arvella Nigh, MD;  Location: [facility];  Service: Gynecology;  Laterality: N/A;   APPENDECTOMY  1987   BREAST EXCISIONAL BIOPSY Left    No scar visable    BREAST REDUCTION SURGERY  2000  aprrox   ENDOMETRIAL ABLATION W/ NOVASURE  2011   in office   EXCISION LEFT BREAST BX  12-07-1999   benign   LAPAROSCOPIC CHOLECYSTECTOMY  02-09-2005   LAPAROSCOPIC VAGINAL HYSTERECTOMY WITH SALPINGO OOPHORECTOMY Bilateral 04/12/2016   Procedure: LAPAROSCOPIC ASSISTED VAGINAL HYSTERECTOMY WITH SALPINGO OOPHORECTOMY;  Surgeon: Arvella Nigh, MD;  Location: [facility];  Service: Gynecology;  Laterality: Bilateral;   ORIF ANKLE FRACTURE Left 05/21/2022   Procedure: OPEN REDUCTION INTERNAL FIXATION (ORIF) LEFT DISTAL FIBULA FRACTURE;  Surgeon: Newt Minion, MD;  Location: [facility];  Service: Orthopedics;  Laterality: Left;   ORIF RADIAL FRACTURE Left 02/09/2021   Procedure: ORIF LEFT DISTAL RADIUS FRACTURE;  Surgeon: Cindra Presume, MD;  Location: [facility];  Service: Plastics;   Laterality: Left;  90 min   REDUCTION MAMMAPLASTY Bilateral    ROUX-EN-Y GASTRIC BYPASS  06/  2002   TUBAL LIGATION  10/ 1990   Social History   Occupational History   Occupation: Building control surveyor  Tobacco Use   Smoking status: Never   Smokeless tobacco: Never  Vaping Use   Vaping Use: Never used  Substance and Sexual Activity   Alcohol use: No    Comment: drinks daily - none since 08/2012 per pt.   Drug use: No   Sexual activity: Yes    Birth control/protection: Surgical

## 2022-07-20 ENCOUNTER — Ambulatory Visit: Payer: Commercial Managed Care - HMO | Admitting: Orthopedic Surgery

## 2022-07-20 ENCOUNTER — Telehealth: Payer: Self-pay | Admitting: Orthopedic Surgery

## 2022-07-20 DIAGNOSIS — S8262XD Displaced fracture of lateral malleolus of left fibula, subsequent encounter for closed fracture with routine healing: Secondary | ICD-10-CM

## 2022-07-20 MED ORDER — OXYCODONE-ACETAMINOPHEN 5-325 MG PO TABS: 1.0000 | ORAL_TABLET | Freq: Three times a day (TID) | ORAL | 0 refills | Status: DC | PRN

## 2022-07-20 NOTE — Telephone Encounter (Signed)
Patient states oxycodone was supposed to be fax fax to [facility] on [address]

## 2022-07-20 NOTE — Telephone Encounter (Signed)
05/21/2022 ORIF left distal fib fx pt was in the office today. Did you advise you would send in an rx for Oxycodone? If so she states she would like this to go to the [facility] on [address]

## 2022-07-21 ENCOUNTER — Encounter: Payer: Self-pay | Admitting: Orthopedic Surgery

## 2022-07-21 NOTE — Progress Notes (Signed)
Office Visit Note   Patient: Tammy [name]           Date of Birth: [birth year]           MRN: [id number] Visit Date: 07/20/2022              Requested by: Tawnya Crook, MD [place],  [place] [address] PCP: Tawnya Crook, MD  Chief Complaint  Patient presents with   Left Ankle - Routine Post Op    05/21/2022 ORIF left distal fib fx       HPI: Patient is a 61 year old woman who is 2 months post open reduction internal fixation left distal fibula fracture.  She has been on doxycycline and silver cell dressing changes.  Assessment & Plan: Visit Diagnoses:  1. Closed displaced fracture of lateral malleolus of left fibula with routine healing, subsequent encounter     Plan: Patient will advance to weightbearing as tolerated in regular shoes she will work on range of motion of her toes and ankle obtain three-view radiographs of the left ankle at follow-up.  Follow-Up Instructions: Return in about 4 weeks (around 08/17/2022).   Ortho Exam  Patient is alert, oriented, no adenopathy, well-dressed, normal affect, normal respiratory effort. Examination the incision is well-healed there is no redness cellulitis or drainage.  There is 1 area 5 mm in diameter with hypergranulation tissue this was touched with silver nitrate.  Imaging: No results found. No images are attached to the encounter.  Labs: Lab Results  Component Value Date   HGBA1C 4.7 12/02/2021   HGBA1C 5.3 08/01/2019   CRP 1.6 (H) 04/14/2019   REPTSTATUS 05/01/2022 FINAL 04/29/2022   CULT >=100,000 COLONIES/mL KLEBSIELLA PNEUMONIAE (A) 04/29/2022   LABORGA KLEBSIELLA PNEUMONIAE (A) 04/29/2022     Lab Results  Component Value Date   ALBUMIN 4.6 04/29/2022   ALBUMIN 4.4 02/14/2022   ALBUMIN 3.9 12/02/2021    Lab Results  Component Value Date   MG 2.2 12/02/2021   MG 2.0 10/24/2021   MG 2.5 (H) 10/28/2019   Lab Results  Component Value Date   VD25OH 21.40 (L) 12/02/2021    No  results found for: "PREALBUMIN"    Latest Ref Rng & Units 04/29/2022    7:23 AM 02/14/2022    1:33 PM 02/14/2022    1:18 PM  CBC EXTENDED  WBC 4.0 - 10.5 K/uL 8.9  8.5    RBC 3.87 - 5.11 MIL/uL 4.93  4.99    Hemoglobin 12.0 - 15.0 g/dL 14.4  13.8  15.0   HCT 36.0 - 46.0 % 43.8  44.5  44.0   Platelets 150 - 400 K/uL 248  199    NEUT# 1.7 - 7.7 K/uL 6.8  6.1    Lymph# 0.7 - 4.0 K/uL 1.6  1.6       There is no height or weight on file to calculate BMI.  Orders:  No orders of the defined types were placed in this encounter.  Meds ordered this encounter  Medications   oxyCODONE-acetaminophen (PERCOCET/ROXICET) 5-325 MG tablet    Sig: Take 1 tablet by mouth every 8 (eight) hours as needed for severe pain.    Dispense:  30 tablet    Refill:  0     Procedures: No procedures performed  Clinical Data: No additional findings.  ROS:  All other systems negative, except as noted in the HPI. Review of Systems  Objective: Vital Signs: LMP 09/30/2012   Specialty Comments:  No specialty comments available.  PMFS History: Patient Active Problem List   Diagnosis Date Noted   Closed displaced fracture of lateral malleolus of left fibula    History of gastric bypass 12/02/2021   Degenerative disc disease, lumbar 11/05/2021   Hallucinations 10/24/2021   Weakness 10/24/2021   AF (paroxysmal atrial fibrillation) ([place]) 10/24/2021   OSA (obstructive sleep apnea) 10/24/2021   Encephalopathy 09/17/2021   Hyponatremia 09/17/2021   Anemia 09/17/2021   Iron deficiency anemia 09/17/2021   Closed nondisplaced fracture of neck of right radius 07/21/2020   Somnolence    Aspiration pneumonia ([place]) 02/20/2020   Lactic acidosis 58/06/9832   Acute metabolic encephalopathy 82/50/5397   Sepsis ([place]) 02/20/2020   Chronic pain of both knees 01/08/2020   Encephalopathy acute 10/27/2019   Acute encephalopathy 10/27/2019   CAP (community acquired pneumonia) 07/31/2019   Atrial fibrillation with  RVR ([place]) 07/31/2019   Morbid obesity with BMI of 50.0-59.9, adult ([place]) 07/31/2019   Respiratory failure ([place]) 04/14/2019   Community acquired pneumonia 04/14/2019   Closed nondisplaced fracture of head of left radius 06/20/2018   Sprain of left wrist 06/20/2018   Peroneal tendinitis, right leg 06/23/2017   Trochanteric bursitis, right hip 06/23/2017   Acute right-sided low back pain with left-sided sciatica 03/24/2017   Pain in right hip 03/24/2017   Impingement syndrome of left shoulder 08/30/2016   Impingement syndrome of right shoulder 08/30/2016   Plantar fasciitis, bilateral 08/30/2016   Pelvic relaxation 04/13/2016    Class: Present on Admission   S/P hysterectomy 04/12/2016   Depression 11/16/2015   Generalized anxiety disorder 11/16/2015   Convulsion ([place]) 11/16/2015   SUI (stress urinary incontinence, female) 09/26/2012   Past Medical History:  Diagnosis Date   Anemia    Anxiety    Arthritis    neck and lower back - deg disc disease, fingers   Atrial fibrillation and flutter ([place])    COVID-19 2020   Depression    Frequency of urination    GERD (gastroesophageal reflux disease)    History of seizure per pt no seizure since    11-07-2015  in setting of stress and sleep deprivation  /  negative work-up by neurologist unknown idiology   Injury of thumb, left    03-31-2016     Pelvic relaxation    Pneumonia    SUI (stress urinary incontinence, female)     Family History  Problem Relation Age of Onset   Hypertension Mother    Hearing loss Mother    Arthritis Mother    Colon polyps Mother    Hearing loss Father    Arthritis Father    Heart disease Father 40       CABG   Depression Sister    Arthritis Sister    Depression Daughter    Arthritis Daughter    Alcohol abuse Daughter    Breast cancer Maternal Aunt    Diabetes Maternal Aunt        Type I   Stomach cancer Maternal Uncle    Colon cancer Maternal Uncle        x3   Diabetes Maternal Uncle         x5 - Type II   Stomach cancer Maternal Grandmother    Diabetes Maternal Grandmother    Heart disease Maternal Grandmother    Heart disease Paternal Grandmother    Esophageal cancer Neg Hx    Gallbladder disease Neg Hx    Seizures Neg Hx  Past Surgical History:  Procedure Laterality Date   ANTERIOR AND POSTERIOR REPAIR  09/25/2012   Procedure: ANTERIOR (CYSTOCELE) AND POSTERIOR REPAIR (RECTOCELE);  Surgeon: Lahoma Crocker, MD;  Location: [facility];  Service: Gynecology;  Laterality: N/A;   ANTERIOR AND POSTERIOR REPAIR WITH SACROSPINOUS FIXATION N/A 04/12/2016   Procedure: ANTERIOR AND POSTERIOR REPAIR WITH SACROSPINOUS LIGAMENT SUSPENSION, CYSTOSCOPY;  Surgeon: Arvella Nigh, MD;  Location: [facility];  Service: Gynecology;  Laterality: N/A;   APPENDECTOMY  1987   BREAST EXCISIONAL BIOPSY Left    No scar visable    BREAST REDUCTION SURGERY  2000  aprrox   ENDOMETRIAL ABLATION W/ NOVASURE  2011   in office   EXCISION LEFT BREAST BX  12-07-1999   benign   LAPAROSCOPIC CHOLECYSTECTOMY  02-09-2005   LAPAROSCOPIC VAGINAL HYSTERECTOMY WITH SALPINGO OOPHORECTOMY Bilateral 04/12/2016   Procedure: LAPAROSCOPIC ASSISTED VAGINAL HYSTERECTOMY WITH SALPINGO OOPHORECTOMY;  Surgeon: Arvella Nigh, MD;  Location: [facility];  Service: Gynecology;  Laterality: Bilateral;   ORIF ANKLE FRACTURE Left 05/21/2022   Procedure: OPEN REDUCTION INTERNAL FIXATION (ORIF) LEFT DISTAL FIBULA FRACTURE;  Surgeon: Newt Minion, MD;  Location: [facility];  Service: Orthopedics;  Laterality: Left;   ORIF RADIAL FRACTURE Left 02/09/2021   Procedure: ORIF LEFT DISTAL RADIUS FRACTURE;  Surgeon: Cindra Presume, MD;  Location: [facility];  Service: Plastics;  Laterality: Left;  90 min   REDUCTION MAMMAPLASTY Bilateral    ROUX-EN-Y GASTRIC BYPASS  06/  2002   TUBAL LIGATION  10/ 1990   Social History   Occupational History   Occupation: Building control surveyor  Tobacco Use   Smoking status: Never   Smokeless  tobacco: Never  Vaping Use   Vaping Use: Never used  Substance and Sexual Activity   Alcohol use: No    Comment: drinks daily - none since 08/2012 per pt.   Drug use: No   Sexual activity: Yes    Birth control/protection: Surgical

## 2022-08-17 ENCOUNTER — Ambulatory Visit (INDEPENDENT_AMBULATORY_CARE_PROVIDER_SITE_OTHER): Payer: Commercial Managed Care - HMO

## 2022-08-17 ENCOUNTER — Ambulatory Visit: Payer: Commercial Managed Care - HMO | Admitting: Orthopedic Surgery

## 2022-08-17 DIAGNOSIS — S8262XD Displaced fracture of lateral malleolus of left fibula, subsequent encounter for closed fracture with routine healing: Secondary | ICD-10-CM

## 2022-08-19 ENCOUNTER — Encounter: Payer: Self-pay | Admitting: Orthopedic Surgery

## 2022-08-19 NOTE — Progress Notes (Signed)
Office Visit Note   Patient: Tammy [name]           Date of Birth: 09/14/1961           MRN: [id number] Visit Date: 08/17/2022              Requested by: Tawnya Crook, MD [place],  [place] [address] PCP: Tawnya Crook, MD  Chief Complaint  Patient presents with   Left Ankle - Routine Post Op    05/21/2022 ORIF left distal fib fx      HPI: Patient is a 61 year old woman who is 3 months status post open duction internal fixation left ankle fracture.  She is currently weightbearing as tolerated in regular shoes.  She has no complaints.  Assessment & Plan: Visit Diagnoses:  1. Closed displaced fracture of lateral malleolus of left fibula with routine healing, subsequent encounter     Plan: Discussed possibility of physical therapy for strengthening patient states she does not want to consider therapy at this time.  Follow-Up Instructions: Return if symptoms worsen or fail to improve.   Ortho Exam  Patient is alert, oriented, no adenopathy, well-dressed, normal affect, normal respiratory effort. Examination patient has good dorsiflexion of the ankle of 20 degrees the incision is well-healed she has good ankle and subtalar range of motion.  Minimal swelling  Imaging: No results found. No images are attached to the encounter.  Labs: Lab Results  Component Value Date   HGBA1C 4.7 12/02/2021   HGBA1C 5.3 08/01/2019   CRP 1.6 (H) 04/14/2019   REPTSTATUS 05/01/2022 FINAL 04/29/2022   CULT >=100,000 COLONIES/mL KLEBSIELLA PNEUMONIAE (A) 04/29/2022   LABORGA KLEBSIELLA PNEUMONIAE (A) 04/29/2022     Lab Results  Component Value Date   ALBUMIN 4.6 04/29/2022   ALBUMIN 4.4 02/14/2022   ALBUMIN 3.9 12/02/2021    Lab Results  Component Value Date   MG 2.2 12/02/2021   MG 2.0 10/24/2021   MG 2.5 (H) 10/28/2019   Lab Results  Component Value Date   VD25OH 21.40 (L) 12/02/2021    No results found for: "PREALBUMIN"    Latest Ref Rng &  Units 04/29/2022    7:23 AM 02/14/2022    1:33 PM 02/14/2022    1:18 PM  CBC EXTENDED  WBC 4.0 - 10.5 K/uL 8.9  8.5    RBC 3.87 - 5.11 MIL/uL 4.93  4.99    Hemoglobin 12.0 - 15.0 g/dL 14.4  13.8  15.0   HCT 36.0 - 46.0 % 43.8  44.5  44.0   Platelets 150 - 400 K/uL 248  199    NEUT# 1.7 - 7.7 K/uL 6.8  6.1    Lymph# 0.7 - 4.0 K/uL 1.6  1.6       There is no height or weight on file to calculate BMI.  Orders:  Orders Placed This Encounter  Procedures   XR Ankle Complete Left   No orders of the defined types were placed in this encounter.    Procedures: No procedures performed  Clinical Data: No additional findings.  ROS:  All other systems negative, except as noted in the HPI. Review of Systems  Objective: Vital Signs: LMP 09/30/2012   Specialty Comments:  No specialty comments available.  PMFS History: Patient Active Problem List   Diagnosis Date Noted   Closed displaced fracture of lateral malleolus of left fibula    History of gastric bypass 12/02/2021   Degenerative disc disease, lumbar 11/05/2021  Hallucinations 10/24/2021   Weakness 10/24/2021   AF (paroxysmal atrial fibrillation) ([place]) 10/24/2021   OSA (obstructive sleep apnea) 10/24/2021   Encephalopathy 09/17/2021   Hyponatremia 09/17/2021   Anemia 09/17/2021   Iron deficiency anemia 09/17/2021   Closed nondisplaced fracture of neck of right radius 07/21/2020   Somnolence    Aspiration pneumonia ([place]) 02/20/2020   Lactic acidosis 08/81/1031   Acute metabolic encephalopathy 59/45/8592   Sepsis ([place]) 02/20/2020   Chronic pain of both knees 01/08/2020   Encephalopathy acute 10/27/2019   Acute encephalopathy 10/27/2019   CAP (community acquired pneumonia) 07/31/2019   Atrial fibrillation with RVR ([place]) 07/31/2019   Morbid obesity with BMI of 50.0-59.9, adult ([place]) 07/31/2019   Respiratory failure ([place]) 04/14/2019   Community acquired pneumonia 04/14/2019   Closed nondisplaced fracture of head of  left radius 06/20/2018   Sprain of left wrist 06/20/2018   Peroneal tendinitis, right leg 06/23/2017   Trochanteric bursitis, right hip 06/23/2017   Acute right-sided low back pain with left-sided sciatica 03/24/2017   Pain in right hip 03/24/2017   Impingement syndrome of left shoulder 08/30/2016   Impingement syndrome of right shoulder 08/30/2016   Plantar fasciitis, bilateral 08/30/2016   Pelvic relaxation 04/13/2016    Class: Present on Admission   S/P hysterectomy 04/12/2016   Depression 11/16/2015   Generalized anxiety disorder 11/16/2015   Convulsion ([place]) 11/16/2015   SUI (stress urinary incontinence, female) 09/26/2012   Past Medical History:  Diagnosis Date   Anemia    Anxiety    Arthritis    neck and lower back - deg disc disease, fingers   Atrial fibrillation and flutter ([place])    COVID-19 2020   Depression    Frequency of urination    GERD (gastroesophageal reflux disease)    History of seizure per pt no seizure since    11-07-2015  in setting of stress and sleep deprivation  /  negative work-up by neurologist unknown idiology   Injury of thumb, left    03-31-2016     Pelvic relaxation    Pneumonia    SUI (stress urinary incontinence, female)     Family History  Problem Relation Age of Onset   Hypertension Mother    Hearing loss Mother    Arthritis Mother    Colon polyps Mother    Hearing loss Father    Arthritis Father    Heart disease Father 86       CABG   Depression Sister    Arthritis Sister    Depression Daughter    Arthritis Daughter    Alcohol abuse Daughter    Breast cancer Maternal Aunt    Diabetes Maternal Aunt        Type I   Stomach cancer Maternal Uncle    Colon cancer Maternal Uncle        x3   Diabetes Maternal Uncle        x5 - Type II   Stomach cancer Maternal Grandmother    Diabetes Maternal Grandmother    Heart disease Maternal Grandmother    Heart disease Paternal Grandmother    Esophageal cancer Neg Hx    Gallbladder  disease Neg Hx    Seizures Neg Hx     Past Surgical History:  Procedure Laterality Date   ANTERIOR AND POSTERIOR REPAIR  09/25/2012   Procedure: ANTERIOR (CYSTOCELE) AND POSTERIOR REPAIR (RECTOCELE);  Surgeon: Lahoma Crocker, MD;  Location: [facility];  Service: Gynecology;  Laterality: N/A;   ANTERIOR AND  POSTERIOR REPAIR WITH SACROSPINOUS FIXATION N/A 04/12/2016   Procedure: ANTERIOR AND POSTERIOR REPAIR WITH SACROSPINOUS LIGAMENT SUSPENSION, CYSTOSCOPY;  Surgeon: Arvella Nigh, MD;  Location: [facility];  Service: Gynecology;  Laterality: N/A;   APPENDECTOMY  1987   BREAST EXCISIONAL BIOPSY Left    No scar visable    BREAST REDUCTION SURGERY  2000  aprrox   ENDOMETRIAL ABLATION W/ NOVASURE  2011   in office   EXCISION LEFT BREAST BX  12-07-1999   benign   LAPAROSCOPIC CHOLECYSTECTOMY  02-09-2005   LAPAROSCOPIC VAGINAL HYSTERECTOMY WITH SALPINGO OOPHORECTOMY Bilateral 04/12/2016   Procedure: LAPAROSCOPIC ASSISTED VAGINAL HYSTERECTOMY WITH SALPINGO OOPHORECTOMY;  Surgeon: Arvella Nigh, MD;  Location: [facility];  Service: Gynecology;  Laterality: Bilateral;   ORIF ANKLE FRACTURE Left 05/21/2022   Procedure: OPEN REDUCTION INTERNAL FIXATION (ORIF) LEFT DISTAL FIBULA FRACTURE;  Surgeon: Newt Minion, MD;  Location: [facility];  Service: Orthopedics;  Laterality: Left;   ORIF RADIAL FRACTURE Left 02/09/2021   Procedure: ORIF LEFT DISTAL RADIUS FRACTURE;  Surgeon: Cindra Presume, MD;  Location: [facility];  Service: Plastics;  Laterality: Left;  90 min   REDUCTION MAMMAPLASTY Bilateral    ROUX-EN-Y GASTRIC BYPASS  06/  2002   TUBAL LIGATION  10/ 1990   Social History   Occupational History   Occupation: Building control surveyor  Tobacco Use   Smoking status: Never   Smokeless tobacco: Never  Vaping Use   Vaping Use: Never used  Substance and Sexual Activity   Alcohol use: No    Comment: drinks daily - none since 08/2012 per pt.   Drug use: No   Sexual activity: Yes    Birth  control/protection: Surgical

## 2022-10-05 ENCOUNTER — Telehealth: Payer: Self-pay | Admitting: Cardiology

## 2022-10-05 DIAGNOSIS — I480 Paroxysmal atrial fibrillation: Secondary | ICD-10-CM

## 2022-10-05 NOTE — Telephone Encounter (Signed)
*  STAT* If patient is at the pharmacy, call can be transferred to refill team.   1. Which medications need to be refilled? (please list name of each medication and dose if known) apixaban (ELIQUIS) 5 MG TABS tablet   2. Which pharmacy/location (including street and city if local pharmacy) is medication to be sent to?  [id number] [place] - [address]    3. Do they need a 30 day or 90 day supply? [address]

## 2022-10-06 MED ORDER — APIXABAN 5 MG PO TABS: 5.0000 mg | ORAL_TABLET | Freq: Two times a day (BID) | ORAL | 1 refills | Status: DC

## 2022-10-06 NOTE — Telephone Encounter (Signed)
Eliquis '5mg'$  refill request received. Patient is 62 years old, weight-119.5kg, Crea-0.77 on 04/29/2022, Diagnosis-Afib, and last seen by Ambrose Pancoast, NP on 05/24/2022. Dose is appropriate based on dosing criteria. Will send in refill to requested pharmacy.

## 2022-10-13 ENCOUNTER — Ambulatory Visit: Payer: Commercial Managed Care - HMO | Admitting: Family Medicine

## 2022-11-08 ENCOUNTER — Ambulatory Visit (INDEPENDENT_AMBULATORY_CARE_PROVIDER_SITE_OTHER): Payer: Commercial Managed Care - HMO | Admitting: Family Medicine

## 2022-11-08 ENCOUNTER — Encounter: Payer: Self-pay | Admitting: Family Medicine

## 2022-11-08 VITALS — BP 120/60 | HR 69 | Temp 97.8°F | Ht 66.0 in | Wt 257.4 lb

## 2022-11-08 DIAGNOSIS — R21 Rash and other nonspecific skin eruption: Secondary | ICD-10-CM | POA: Diagnosis not present

## 2022-11-08 MED ORDER — TRIAMCINOLONE ACETONIDE 0.1 % EX CREA: 1.0000 | TOPICAL_CREAM | Freq: Two times a day (BID) | CUTANEOUS | 0 refills | Status: DC

## 2022-11-08 NOTE — Patient Instructions (Signed)
It was very nice to see you today!  Call Dr. Delman Cheadle for appt  Triamcinonolone cream for 2 wks and let me know.  Worse, let me know.    PLEASE NOTE:  If you had any lab tests please let us know if you have not heard back within a few days. You may see your results on MyChart before we have a chance to review them but we will give you a call once they are reviewed by [place]. If we ordered any referrals today, please let us know if you have not heard from their office within the next week.   Please try these tips to maintain a healthy lifestyle:  Eat most of your calories during the day when you are active. Eliminate processed foods including packaged sweets (pies, cakes, cookies), reduce intake of potatoes, white bread, white pasta, and white rice. Look for whole grain options, oat flour or almond flour.  Each meal should contain half fruits/vegetables, one quarter protein, and one quarter carbs (no bigger than a computer mouse).  Cut down on sweet beverages. This includes juice, soda, and sweet tea. Also watch fruit intake, though this is a healthier sweet option, it still contains natural sugar! Limit to 3 servings daily.  Drink at least 1 glass of water with each meal and aim for at least 8 glasses per day  Exercise at least 150 minutes every week.

## 2022-11-08 NOTE — Progress Notes (Signed)
Subjective:     Patient ID: Tammy [name], female    DOB: [birth year], 62 y.o.   MRN: [id number]  Chief Complaint  Patient presents with   Rash    Rash on left arm that started a couple of months     HPI Past 2 months, rash L arm upper-has dogs but no scratching.  No HRT.  Getting new spots. Not itchy. No pain.  Has tried otc cortisone-no change.  Never had in past.   Health Maintenance Due  Topic Date Due   COLONOSCOPY (Pts 45-33yr Insurance coverage will need to be confirmed)  Never done    Past Medical History:  Diagnosis Date   Anemia    Anxiety    Arthritis    neck and lower back - deg disc disease, fingers   Atrial fibrillation and flutter ([facility]    COVID-19 2020   Depression    Frequency of urination    GERD (gastroesophageal reflux disease)    History of seizure per pt no seizure since    11-07-2015  in setting of stress and sleep deprivation  /  negative work-up by neurologist unknown idiology   Injury of thumb, left    03-31-2016     Pelvic relaxation    Pneumonia    SUI (stress urinary incontinence, female)     Past Surgical History:  Procedure Laterality Date   ANTERIOR AND POSTERIOR REPAIR  09/25/2012   Procedure: ANTERIOR (CYSTOCELE) AND POSTERIOR REPAIR (RECTOCELE);  Surgeon: LLahoma Crocker MD;  [facility];  Service: Gynecology;  Laterality: N/A;   ANTERIOR AND POSTERIOR REPAIR WITH SACROSPINOUS FIXATION N/A 04/12/2016   Procedure: ANTERIOR AND POSTERIOR REPAIR WITH SACROSPINOUS LIGAMENT SUSPENSION, CYSTOSCOPY;  Surgeon: JArvella Nigh MD;  [facility]  Service: Gynecology;  Laterality: N/A;   APPENDECTOMY  1987   BREAST EXCISIONAL BIOPSY Left    No scar visable    BREAST REDUCTION SURGERY  2000  aprrox   ENDOMETRIAL ABLATION W/ NOVASURE  2011   in office   EXCISION LEFT BREAST BX  12-07-1999   benign   LAPAROSCOPIC CHOLECYSTECTOMY  02-09-2005   LAPAROSCOPIC VAGINAL HYSTERECTOMY WITH SALPINGO OOPHORECTOMY  Bilateral 04/12/2016   Procedure: LAPAROSCOPIC ASSISTED VAGINAL HYSTERECTOMY WITH SALPINGO OOPHORECTOMY;  Surgeon: JArvella Nigh MD;  [facility]  Service: Gynecology;  Laterality: Bilateral;   ORIF ANKLE FRACTURE Left 05/21/2022   Procedure: OPEN REDUCTION INTERNAL FIXATION (ORIF) LEFT DISTAL FIBULA FRACTURE;  Surgeon: DNewt Minion MD;  [facility]  Service: Orthopedics;  Laterality: Left;   ORIF RADIAL FRACTURE Left 02/09/2021   Procedure: ORIF LEFT DISTAL RADIUS FRACTURE;  Surgeon: PCindra Presume MD;  [facility]  Service: Plastics;  Laterality: Left;  90 min   REDUCTION MAMMAPLASTY Bilateral    ROUX-EN-Y GASTRIC BYPASS  06/  2002   TUBAL LIGATION  10/ 1990    Outpatient Medications Prior to Visit  Medication Sig Dispense Refill   apixaban (ELIQUIS) 5 MG TABS tablet Take 1 tablet (5 mg total) by mouth 2 (two) times daily. 180 tablet 1   buPROPion (WELLBUTRIN XL) 150 MG 24 hr tablet Take 2 tablets (300 mg total) by mouth daily.     busPIRone (BUSPAR) 10 MG tablet Take 10 mg by mouth 2 (two) times daily.     cholecalciferol (VITAMIN D3) 25 MCG (1000 UNIT) tablet Take 1,000 Units by mouth daily.     clonazePAM (KLONOPIN) 1 MG tablet Take 1 tablet (1  mg total) by mouth 2 (two) times daily. 30 tablet 0   cyanocobalamin (VITAMIN B12) 1000 MCG tablet Take 1,000 mcg by mouth daily.     diphenoxylate-atropine (LOMOTIL) 2.5-0.025 MG tablet Take 1 tablet by mouth 4 (four) times daily as needed for diarrhea or loose stools. 10 tablet 0   ferrous sulfate 325 (65 FE) MG tablet Take 325 mg by mouth daily.     FLUoxetine (PROZAC) 40 MG capsule Take 40 mg by mouth at bedtime.     gabapentin (NEURONTIN) 600 MG tablet Take 1.5 tablets (900 mg total) by mouth 3 (three) times daily. 540 tablet 1   Melatonin 10 MG TABS Take 10 mg by mouth at bedtime as needed (sleep).     metoprolol succinate (TOPROL-XL) 25 MG 24 hr tablet Take 1 tablet (25 mg total) by mouth at bedtime. Take  if heart rate is greater than 90 90 tablet 2   OLANZapine (ZYPREXA) 10 MG tablet Take 10 mg by mouth at bedtime.     oxyCODONE-acetaminophen (PERCOCET/ROXICET) 5-325 MG tablet Take 1 tablet by mouth every 8 (eight) hours as needed for severe pain. 30 tablet 0   potassium chloride SA (KLOR-CON M20) 20 MEQ tablet Take 1 tablet (20 mEq total) by mouth 2 (two) times daily. 180 tablet 1   doxycycline (VIBRA-TABS) 100 MG tablet Take 1 tablet (100 mg total) by mouth 2 (two) times daily. (Patient not taking: Reported on 11/08/2022) 30 tablet 0   No facility-administered medications prior to visit.    Allergies  Allergen Reactions   Nsaids Other (See Comments)    Post Gastric Bypass   Adhesive [Tape] Itching and Rash   Latex Itching, Rash and Other (See Comments)   Sulfa Antibiotics Itching and Rash   ROS neg/noncontributory except as noted HPI/below Has cut back on snacking.  Losing some wt. Just cleared to exercise.        Objective:     BP 120/60   Pulse 69   Temp 97.8 F (36.6 C) (Temporal)   Ht '5\' 6"'$  (1.676 m)   Wt 257 lb 6 oz (116.7 kg)   LMP 09/30/2012   SpO2 95%   BMI 41.54 kg/m  Wt Readings from Last 3 Encounters:  11/08/22 257 lb 6 oz (116.7 kg)  06/02/22 263 lb 8 oz (119.5 kg)  05/24/22 266 lb (120.7 kg)    Physical Exam   Gen: W[facility]N NAD HEENT: NCAT, conjunctiva not injected, sclera nonicteric MSK: no gross abnormalities.  NEURO: A&O x3.  CN II-XII intact.  PSYCH: normal mood. Good eye contact Pink, m/p rash in circular pattern -3 patches of them-L upper arm flap      Assessment & Plan:   Problem List Items Addressed This Visit   None Visit Diagnoses     Rash    -  Primary      Rash-?ring worm-doubt, doubt contact derm.  Other-will try triamcinolone 0.1% bid.  If worse, let us know or no change in 2 wks.  Advised to f/u derm(has seen Dr. Delman Cheadle in past)  Meds ordered this encounter  Medications   triamcinolone cream (KENALOG) 0.1 %    Sig: Apply 1  Application topically 2 (two) times daily.    Dispense:  30 g    Refill:  0    Wellington Hampshire, MD

## 2022-11-11 ENCOUNTER — Encounter (HOSPITAL_COMMUNITY): Payer: Self-pay | Admitting: *Deleted

## 2022-11-22 ENCOUNTER — Other Ambulatory Visit: Payer: Self-pay | Admitting: Cardiology

## 2022-11-25 ENCOUNTER — Other Ambulatory Visit: Payer: Self-pay | Admitting: Family Medicine

## 2023-01-03 ENCOUNTER — Other Ambulatory Visit: Payer: Self-pay | Admitting: Cardiology

## 2023-01-03 DIAGNOSIS — I480 Paroxysmal atrial fibrillation: Secondary | ICD-10-CM

## 2023-01-03 NOTE — Telephone Encounter (Signed)
Prescription refill request for Eliquis received. Indication: PAF Last office visit: 05/24/22  Elvin So NP Scr: 0.77 on 04/29/22  Epic Age: 62 Weight: 120.7kg  Based on above findings Eliquis 5mg  twice daily is the appropriate dose.  Refill approved.

## 2023-02-18 ENCOUNTER — Other Ambulatory Visit: Payer: Self-pay | Admitting: Nurse Practitioner

## 2023-03-22 ENCOUNTER — Ambulatory Visit (INDEPENDENT_AMBULATORY_CARE_PROVIDER_SITE_OTHER): Payer: Commercial Managed Care - HMO | Admitting: Family Medicine

## 2023-03-22 ENCOUNTER — Encounter: Payer: Self-pay | Admitting: Family Medicine

## 2023-03-22 VITALS — BP 112/70 | HR 80 | Temp 98.1°F | Resp 18 | Ht 66.0 in | Wt 242.5 lb

## 2023-03-22 DIAGNOSIS — R350 Frequency of micturition: Secondary | ICD-10-CM

## 2023-03-22 DIAGNOSIS — M79671 Pain in right foot: Secondary | ICD-10-CM

## 2023-03-22 DIAGNOSIS — R300 Dysuria: Secondary | ICD-10-CM

## 2023-03-22 DIAGNOSIS — I480 Paroxysmal atrial fibrillation: Secondary | ICD-10-CM | POA: Diagnosis not present

## 2023-03-22 DIAGNOSIS — R21 Rash and other nonspecific skin eruption: Secondary | ICD-10-CM | POA: Diagnosis not present

## 2023-03-22 DIAGNOSIS — Z6839 Body mass index (BMI) 39.0-39.9, adult: Secondary | ICD-10-CM

## 2023-03-22 DIAGNOSIS — G4733 Obstructive sleep apnea (adult) (pediatric): Secondary | ICD-10-CM

## 2023-03-22 LAB — POC URINALSYSI DIPSTICK (AUTOMATED)
Bilirubin, UA: NEGATIVE
Blood, UA: POSITIVE
Glucose, UA: NEGATIVE
Ketones, UA: NEGATIVE
Leukocytes, UA: NEGATIVE
Nitrite, UA: NEGATIVE
Protein, UA: NEGATIVE
Spec Grav, UA: 1.02 (ref 1.010–1.025)
Urobilinogen, UA: 0.2 E.U./dL
pH, UA: 6 (ref 5.0–8.0)

## 2023-03-22 MED ORDER — ZEPBOUND 2.5 MG/0.5ML ~~LOC~~ SOAJ
2.5000 mg | SUBCUTANEOUS | 0 refills | Status: DC
Start: 2023-03-22 — End: 2023-06-14

## 2023-03-22 MED ORDER — CEPHALEXIN 500 MG PO CAPS: 500.0000 mg | ORAL_CAPSULE | Freq: Two times a day (BID) | ORAL | 0 refills | Status: AC

## 2023-03-22 MED ORDER — KETOCONAZOLE 2 % EX CREA: 1.0000 | TOPICAL_CREAM | Freq: Two times a day (BID) | CUTANEOUS | 0 refills | Status: AC

## 2023-03-22 NOTE — Progress Notes (Signed)
Subjective:     Patient ID: Tammy [name], female    DOB: 07/06/1961, 62 y.o.   MRN: [id number]  Chief Complaint  Patient presents with   Rash    Rash on left arm   Weight Loss    Discuss taking weight loss drug, something like Ozempic   Urinary Frequency    Sx started 1 week ago   Burning with urination   Foot Injury    Tripped over dishwasher door on Sunday, bruised right foot, having some pain    HPI  Rash left arm over 6 months. Seen in Feb and Prescription triamcinolone and told to follow up derm. Triamcin helps some.  Can't get appointment w/derm till September so didn't schedule. Not itching, just not resolving.  Dysuria,frequency for 1 week(s), nocturia 2-3 x.  No abdomen pain.  No fevers/chills. No new back pain.  Obesity-discuss weight loss medications. Roux-n y 2002-started at 72 and lost to 165 for 10 years, but has gained again.  Hard to exercise due to foot pain and fracture in foot in past.  Working on smaller portions.  Has seen dietician in past so knows what to do.Has checked w/insurance and will cover if other issues. Has a fib.depression/anxiety/medications  no SI.  No FH MTC.  +OSA Tripped over dishwasher door on 6/16.  Big knot on right foot.  Sees orthopedic in few days. Pain to walk on it.  No pain if not bearing weight.    Health Maintenance Due  Topic Date Due   Colonoscopy  Never done    Past Medical History:  Diagnosis Date   Anemia    Anxiety    Arthritis    neck and lower back - deg disc disease, fingers   Atrial fibrillation and flutter (HCC)    COVID-19 2020   Depression    Frequency of urination    GERD (gastroesophageal reflux disease)    History of seizure per pt no seizure since    11-07-2015  in setting of stress and sleep deprivation  /  negative work-up by neurologist unknown idiology   Injury of thumb, left    03-31-2016     Pelvic relaxation    Pneumonia    SUI (stress urinary incontinence, female)     Past Surgical  History:  Procedure Laterality Date   ANTERIOR AND POSTERIOR REPAIR  09/25/2012   Procedure: ANTERIOR (CYSTOCELE) AND POSTERIOR REPAIR (RECTOCELE);  Surgeon: Antionette Char, MD;  [facility];  Service: Gynecology;  Laterality: N/A;   ANTERIOR AND POSTERIOR REPAIR WITH SACROSPINOUS FIXATION N/A 04/12/2016   Procedure: ANTERIOR AND POSTERIOR REPAIR WITH SACROSPINOUS LIGAMENT SUSPENSION, CYSTOSCOPY;  Surgeon: Richardean Chimera, MD;  Location: [facility];  Service: Gynecology;  Laterality: N/A;   APPENDECTOMY  1987   BREAST EXCISIONAL BIOPSY Left    No scar visable    BREAST REDUCTION SURGERY  2000  aprrox   ENDOMETRIAL ABLATION W/ NOVASURE  2011   in office   EXCISION LEFT BREAST BX  12-07-1999   benign   LAPAROSCOPIC CHOLECYSTECTOMY  02-09-2005   LAPAROSCOPIC VAGINAL HYSTERECTOMY WITH SALPINGO OOPHORECTOMY Bilateral 04/12/2016   Procedure: LAPAROSCOPIC ASSISTED VAGINAL HYSTERECTOMY WITH SALPINGO OOPHORECTOMY;  Surgeon: Richardean Chimera, MD;  Location: [facility];  Service: Gynecology;  Laterality: Bilateral;   ORIF ANKLE FRACTURE Left 05/21/2022   Procedure: OPEN REDUCTION INTERNAL FIXATION (ORIF) LEFT DISTAL FIBULA FRACTURE;  Surgeon: Nadara Mustard, MD;  [facility];  Service: Orthopedics;  Laterality: Left;  ORIF RADIAL FRACTURE Left 02/09/2021   Procedure: ORIF LEFT DISTAL RADIUS FRACTURE;  Surgeon: Allena Napoleon, MD;  [facility];  Service: Plastics;  Laterality: Left;  90 min   REDUCTION MAMMAPLASTY Bilateral    ROUX-EN-Y GASTRIC BYPASS  06/  2002   TUBAL LIGATION  10/ 1990      Allergies  Allergen Reactions   Nsaids Other (See Comments)    Post Gastric Bypass   Latex Itching, Rash and Other (See Comments)   Sulfa Antibiotics Itching and Rash   Tape Itching and Rash   ROS neg/noncontributory except as noted HPI/below      Objective:     BP 112/70   Pulse 80   Temp 98.1 F (36.7 C) (Temporal)   Resp 18   Ht 5\' 6"  (1.676 m)   Wt  242 lb 8 oz (110 kg)   LMP 09/30/2012   SpO2 99%   BMI 39.14 kg/m  Wt Readings from Last 3 Encounters:  03/22/23 242 lb 8 oz (110 kg)  11/08/22 257 lb 6 oz (116.7 kg)  06/02/22 263 lb 8 oz (119.5 kg)    Physical Exam   Gen: WDWN NAD HEENT: NCAT, conjunctiva not injected, sclera nonicteric NECK:  supple, no thyromegaly, no nodes, no carotid bruits CARDIAC: RRR, S1S2+, no murmur. DP 2+B LUNGS: CTAB. No wheezes ABDOMEN:  BS+, soft, mildly tender suprapubically, No HSM, no masses. No CVAT EXT:  no edema MSK: right foot-swelling dorsum proximal metatarsals.  Some tenderness to palpation.  Some abrasion.  Patient walking ok.   NEURO: A&O x3.  CN II-XII intact.  PSYCH: normal mood. Good eye contact Rash-still annular, serpiginous patches upper left arm.   Results for orders placed or performed in visit on 03/22/23  POCT Urinalysis Dipstick (Automated)  Result Value Ref Range   Color, UA YELLOW    Clarity, UA CLEAR    Glucose, UA Negative Negative   Bilirubin, UA NEG    Ketones, UA NEG    Spec Grav, UA 1.020 1.010 - 1.025   Blood, UA POS    pH, UA 6.0 5.0 - 8.0   Protein, UA Negative Negative   Urobilinogen, UA 0.2 0.2 or 1.0 E.U./dL   Nitrite, UA NEG    Leukocytes, UA Negative Negative        Assessment & Plan:  Urinary frequency -     POCT Urinalysis Dipstick (Automated) -     Urine Culture  Burning with urination -     POCT Urinalysis Dipstick (Automated)  Rash  AF (paroxysmal atrial fibrillation) (HCC) -     Zepbound; Inject 2.5 mg into the skin once a week.  Dispense: 2 mL; Refill: 0  OSA (obstructive sleep apnea) -     Zepbound; Inject 2.5 mg into the skin once a week.  Dispense: 2 mL; Refill: 0  Class 2 severe obesity due to excess calories with serious comorbidity and body mass index (BMI) of 39.0 to 39.9 in adult (HCC) -     Zepbound; Inject 2.5 mg into the skin once a week.  Dispense: 2 mL; Refill: 0  Right foot pain  Other orders -     Cephalexin;  Take 1 capsule (500 mg total) by mouth 2 (two) times daily for 7 days. Take for 7 days  Dispense: 14 capsule; Refill: 0 -     Ketoconazole; Apply 1 Application topically 2 (two) times daily.  Dispense: 60 g; Refill: 0   Rash left upper arm-better, but  still there despite moisturizers and triamcinolone.  Will try ketoconazole cream.  Follow up derm Dysuria-check culture.  Keflex 500 mg twice daily Atrial fibrillation-chronic.  Stable.  Managed by card but comorbidity for obesity Obstructive sleep apnea-obesity-needs to lose weight Obesity w/complaining of-morbidities. Patient working on diet.  Limited exercise due to pain.  Zepbound 2.5 mg weekly.  SED-follow up 3 month(s) Foot pain after hitting on dishwasher-she wants to wait till orthopedic appointment for x-ray, etc.    Return in about 3 months (around 06/22/2023) for wt.  Angelena Sole, MD

## 2023-03-22 NOTE — Patient Instructions (Addendum)
It was very nice to see you today!  Work on exercise   take colace 100-200 mg daily and miralax to prevent constipation   PLEASE NOTE:  If you had any lab tests please let us know if you have not heard back within a few days. You may see your results on MyChart before we have a chance to review them but we will give you a call once they are reviewed by [place]. If we ordered any referrals today, please let us know if you have not heard from their office within the next week.   Please try these tips to maintain a healthy lifestyle:  Eat most of your calories during the day when you are active. Eliminate processed foods including packaged sweets (pies, cakes, cookies), reduce intake of potatoes, white bread, white pasta, and white rice. Look for whole grain options, oat flour or almond flour.  Each meal should contain half fruits/vegetables, one quarter protein, and one quarter carbs (no bigger than a computer mouse).  Cut down on sweet beverages. This includes juice, soda, and sweet tea. Also watch fruit intake, though this is a healthier sweet option, it still contains natural sugar! Limit to 3 servings daily.  Drink at least 1 glass of water with each meal and aim for at least 8 glasses per day  Exercise at least 150 minutes every week.

## 2023-03-24 ENCOUNTER — Ambulatory Visit (INDEPENDENT_AMBULATORY_CARE_PROVIDER_SITE_OTHER): Payer: Medicaid Other | Admitting: Orthopedic Surgery

## 2023-03-24 ENCOUNTER — Other Ambulatory Visit (INDEPENDENT_AMBULATORY_CARE_PROVIDER_SITE_OTHER): Payer: Commercial Managed Care - HMO

## 2023-03-24 DIAGNOSIS — M25572 Pain in left ankle and joints of left foot: Secondary | ICD-10-CM

## 2023-03-24 DIAGNOSIS — R200 Anesthesia of skin: Secondary | ICD-10-CM

## 2023-03-24 DIAGNOSIS — M6702 Short Achilles tendon (acquired), left ankle: Secondary | ICD-10-CM | POA: Diagnosis not present

## 2023-03-24 DIAGNOSIS — R202 Paresthesia of skin: Secondary | ICD-10-CM | POA: Diagnosis not present

## 2023-03-24 LAB — URINE CULTURE: MICRO NUMBER:: 15096772

## 2023-03-25 LAB — URINE CULTURE: SPECIMEN QUALITY:: ADEQUATE

## 2023-04-02 ENCOUNTER — Other Ambulatory Visit (HOSPITAL_COMMUNITY): Payer: Self-pay

## 2023-04-02 ENCOUNTER — Telehealth: Payer: Self-pay

## 2023-04-02 NOTE — Telephone Encounter (Signed)
Pharmacy Patient Advocate Encounter  Insurance verification completed.    The patient is insured through [facility]   Ran test claim for Zepbound, which is not covered due to plan/benefit exclusion, test claim included below:   The patient is insured through [facility]  Ran test claim for Zepbound, which is not covered due to plan/benefit exclusion, test claim included below:    This test claim was processed through [facility]- copay amounts may vary at other pharmacies due to pharmacy/plan contracts, or as the patient moves through the different stages of their insurance plan.

## 2023-04-04 ENCOUNTER — Other Ambulatory Visit (HOSPITAL_COMMUNITY): Payer: Self-pay

## 2023-04-06 ENCOUNTER — Other Ambulatory Visit (HOSPITAL_COMMUNITY): Payer: Self-pay

## 2023-04-06 NOTE — Telephone Encounter (Signed)
Patient notified and verbalized understanding. She stated that she will try and see if there is a discount card or something, if not she will not be taking it.

## 2023-04-11 ENCOUNTER — Encounter: Payer: Self-pay | Admitting: Orthopedic Surgery

## 2023-04-11 NOTE — Progress Notes (Signed)
Office Visit Note   Patient: Tammy [name]           Date of Birth: 04-21-1961           MRN: [id number] Visit Date: 03/24/2023              Requested by: Jeani Sow, MD [address] [place],  [place] [address] PCP: Jeani Sow, MD  Chief Complaint  Patient presents with   Left Wrist - Pain   Left Foot - Pain      HPI: Patient is a 62 year old woman who presents with left wrist pain and weakness.  She states that her hand feels like it is going numb most of the time.  She states she has pins and needle sensation in all fingers.  Patient states she fell Saturday and hit her right foot.  Pain on the dorsal lateral aspect of the left foot.  Assessment & Plan: Visit Diagnoses:  1. Pain in left ankle and joints of left foot   2. Numbness and tingling in left hand     Plan: Will obtain nerve conduction studies for the wrist to evaluate for possible carpal tunnel symptoms.  Recommended a stiff soled sneaker to unload the lateral column of her foot.  Recommend Achilles stretching.  Follow-Up Instructions: Return if symptoms worsen or fail to improve.   Ortho Exam  Patient is alert, oriented, no adenopathy, well-dressed, normal affect, normal respiratory effort. Examination of the wrist patient has negative Phalen's negative Tinel's test.  She does have tenderness to palpation dorsally over the wrist.  Scapholunate interval is nontender to palpation TFCC is not tender to palpation.  Decreased grip strength secondary to pain.  Examination of the left ankle she has dorsiflexion to neutral with Achilles tightness.  She has no pain to palpation over the lateral column of her foot she has good pulses.  Imaging: No results found. No images are attached to the encounter.  Labs: Lab Results  Component Value Date   HGBA1C 4.7 12/02/2021   HGBA1C 5.3 08/01/2019   CRP 1.6 (H) 04/14/2019   REPTSTATUS 05/01/2022 FINAL 04/29/2022   CULT >=100,000 COLONIES/mL KLEBSIELLA  PNEUMONIAE (A) 04/29/2022   LABORGA KLEBSIELLA PNEUMONIAE (A) 04/29/2022     Lab Results  Component Value Date   ALBUMIN 4.6 04/29/2022   ALBUMIN 4.4 02/14/2022   ALBUMIN 3.9 12/02/2021    Lab Results  Component Value Date   MG 2.2 12/02/2021   MG 2.0 10/24/2021   MG 2.5 (H) 10/28/2019   Lab Results  Component Value Date   VD25OH 21.40 (L) 12/02/2021    No results found for: "PREALBUMIN"    Latest Ref Rng & Units 04/29/2022    7:23 AM 02/14/2022    1:33 PM 02/14/2022    1:18 PM  CBC EXTENDED  WBC 4.0 - 10.5 K/uL 8.9  8.5    RBC 3.87 - 5.11 MIL/uL 4.93  4.99    Hemoglobin 12.0 - 15.0 g/dL 82.9  56.2  13.0   HCT 36.0 - 46.0 % 43.8  44.5  44.0   Platelets 150 - 400 K/uL 248  199    NEUT# 1.7 - 7.7 K/uL 6.8  6.1    Lymph# 0.7 - 4.0 K/uL 1.6  1.6       There is no height or weight on file to calculate BMI.  Orders:  Orders Placed This Encounter  Procedures   XR Wrist 2 Views Left   XR Ankle Complete  Left   Ambulatory referral to Physical Medicine Rehab   No orders of the defined types were placed in this encounter.    Procedures: No procedures performed  Clinical Data: No additional findings.  ROS:  All other systems negative, except as noted in the HPI. Review of Systems  Objective: Vital Signs: LMP 09/30/2012   Specialty Comments:  No specialty comments available.  PMFS History: Patient Active Problem List   Diagnosis Date Noted   Closed displaced fracture of lateral malleolus of left fibula    History of gastric bypass 12/02/2021   Degenerative disc disease, lumbar 11/05/2021   Hallucinations 10/24/2021   Weakness 10/24/2021   AF (paroxysmal atrial fibrillation) (HCC) 10/24/2021   OSA (obstructive sleep apnea) 10/24/2021   Encephalopathy 09/17/2021   Hyponatremia 09/17/2021   Anemia 09/17/2021   Iron deficiency anemia 09/17/2021   Closed nondisplaced fracture of neck of right radius 07/21/2020   Somnolence    Aspiration pneumonia (HCC)  02/20/2020   Lactic acidosis 02/20/2020   Acute metabolic encephalopathy 02/20/2020   Sepsis (HCC) 02/20/2020   Chronic pain of both knees 01/08/2020   Encephalopathy acute 10/27/2019   Acute encephalopathy 10/27/2019   CAP (community acquired pneumonia) 07/31/2019   Atrial fibrillation with RVR (HCC) 07/31/2019   Morbid obesity with BMI of 50.0-59.9, adult (HCC) 07/31/2019   Respiratory failure (HCC) 04/14/2019   Community acquired pneumonia 04/14/2019   Closed nondisplaced fracture of head of left radius 06/20/2018   Sprain of left wrist 06/20/2018   Peroneal tendinitis, right leg 06/23/2017   Trochanteric bursitis, right hip 06/23/2017   Acute right-sided low back pain with left-sided sciatica 03/24/2017   Pain in right hip 03/24/2017   Impingement syndrome of left shoulder 08/30/2016   Impingement syndrome of right shoulder 08/30/2016   Plantar fasciitis, bilateral 08/30/2016   Pelvic relaxation 04/13/2016    Class: Present on Admission   S/P hysterectomy 04/12/2016   Depression 11/16/2015   Generalized anxiety disorder 11/16/2015   Convulsion (HCC) 11/16/2015   SUI (stress urinary incontinence, female) 09/26/2012   Past Medical History:  Diagnosis Date   Anemia    Anxiety    Arthritis    neck and lower back - deg disc disease, fingers   Atrial fibrillation and flutter (HCC)    COVID-19 2020   Depression    Frequency of urination    GERD (gastroesophageal reflux disease)    History of seizure per pt no seizure since    11-07-2015  in setting of stress and sleep deprivation  /  negative work-up by neurologist unknown idiology   Injury of thumb, left    03-31-2016     Pelvic relaxation    Pneumonia    SUI (stress urinary incontinence, female)     Family History  Problem Relation Age of Onset   Hypertension Mother    Hearing loss Mother    Arthritis Mother    Colon polyps Mother    Hearing loss Father    Arthritis Father    Heart disease Father 23       CABG    Depression Sister    Arthritis Sister    Depression Daughter    Arthritis Daughter    Alcohol abuse Daughter    Breast cancer Maternal Aunt    Diabetes Maternal Aunt        Type I   Stomach cancer Maternal Uncle    Colon cancer Maternal Uncle        x3   Diabetes  Maternal Uncle        x5 - Type II   Stomach cancer Maternal Grandmother    Diabetes Maternal Grandmother    Heart disease Maternal Grandmother    Heart disease Paternal Grandmother    Esophageal cancer Neg Hx    Gallbladder disease Neg Hx    Seizures Neg Hx     Past Surgical History:  Procedure Laterality Date   ANTERIOR AND POSTERIOR REPAIR  09/25/2012   Procedure: ANTERIOR (CYSTOCELE) AND POSTERIOR REPAIR (RECTOCELE);  Surgeon: Antionette Char, MD;  Location: [facility];  Service: Gynecology;  Laterality: N/A;   ANTERIOR AND POSTERIOR REPAIR WITH SACROSPINOUS FIXATION N/A 04/12/2016   Procedure: ANTERIOR AND POSTERIOR REPAIR WITH SACROSPINOUS LIGAMENT SUSPENSION, CYSTOSCOPY;  Surgeon: Richardean Chimera, MD;  Location: [facility];  Service: Gynecology;  Laterality: N/A;   APPENDECTOMY  1987   BREAST EXCISIONAL BIOPSY Left    No scar visable    BREAST REDUCTION SURGERY  2000  aprrox   ENDOMETRIAL ABLATION W/ NOVASURE  2011   in office   EXCISION LEFT BREAST BX  12-07-1999   benign   LAPAROSCOPIC CHOLECYSTECTOMY  02-09-2005   LAPAROSCOPIC VAGINAL HYSTERECTOMY WITH SALPINGO OOPHORECTOMY Bilateral 04/12/2016   Procedure: LAPAROSCOPIC ASSISTED VAGINAL HYSTERECTOMY WITH SALPINGO OOPHORECTOMY;  Surgeon: Richardean Chimera, MD;  Location: [facility];  Service: Gynecology;  Laterality: Bilateral;   ORIF ANKLE FRACTURE Left 05/21/2022   Procedure: OPEN REDUCTION INTERNAL FIXATION (ORIF) LEFT DISTAL FIBULA FRACTURE;  Surgeon: Nadara Mustard, MD;  Location: [facility];  Service: Orthopedics;  Laterality: Left;   ORIF RADIAL FRACTURE Left 02/09/2021   Procedure: ORIF LEFT DISTAL RADIUS FRACTURE;  Surgeon: Allena Napoleon, MD;  Location: [facility];  Service: Plastics;  Laterality: Left;  90 min   REDUCTION MAMMAPLASTY Bilateral    ROUX-EN-Y GASTRIC BYPASS  06/  2002   TUBAL LIGATION  10/ 1990   Social History   Occupational History   Occupation: Chief Strategy Officer  Tobacco Use   Smoking status: Never   Smokeless tobacco: Never  Vaping Use   Vaping Use: Never used  Substance and Sexual Activity   Alcohol use: No    Comment: drinks daily - none since 08/2012 per pt.   Drug use: No   Sexual activity: Yes    Birth control/protection: Surgical

## 2023-05-18 ENCOUNTER — Other Ambulatory Visit: Payer: Self-pay | Admitting: Cardiology

## 2023-05-18 ENCOUNTER — Other Ambulatory Visit: Payer: Self-pay | Admitting: Nurse Practitioner

## 2023-05-19 ENCOUNTER — Encounter (INDEPENDENT_AMBULATORY_CARE_PROVIDER_SITE_OTHER): Payer: Self-pay

## 2023-06-13 ENCOUNTER — Ambulatory Visit: Payer: Commercial Managed Care - HMO | Admitting: Family Medicine

## 2023-06-14 ENCOUNTER — Ambulatory Visit (INDEPENDENT_AMBULATORY_CARE_PROVIDER_SITE_OTHER): Payer: BLUE CROSS/BLUE SHIELD | Admitting: Family Medicine

## 2023-06-14 ENCOUNTER — Encounter: Payer: Self-pay | Admitting: Family Medicine

## 2023-06-14 VITALS — BP 100/65 | HR 70 | Temp 98.1°F | Resp 18 | Ht 66.0 in | Wt 244.4 lb

## 2023-06-14 DIAGNOSIS — Z1211 Encounter for screening for malignant neoplasm of colon: Secondary | ICD-10-CM

## 2023-06-14 DIAGNOSIS — I480 Paroxysmal atrial fibrillation: Secondary | ICD-10-CM

## 2023-06-14 DIAGNOSIS — F411 Generalized anxiety disorder: Secondary | ICD-10-CM

## 2023-06-14 DIAGNOSIS — Z1212 Encounter for screening for malignant neoplasm of rectum: Secondary | ICD-10-CM | POA: Diagnosis not present

## 2023-06-14 DIAGNOSIS — Z6839 Body mass index (BMI) 39.0-39.9, adult: Secondary | ICD-10-CM | POA: Diagnosis not present

## 2023-06-14 NOTE — Patient Instructions (Signed)
It was very nice to see you today!  Great job on exercise   PLEASE NOTE:  If you had any lab tests please let us know if you have not heard back within a few days. You may see your results on MyChart before we have a chance to review them but we will give you a call once they are reviewed by [place]. If we ordered any referrals today, please let us know if you have not heard from their office within the next week.   Please try these tips to maintain a healthy lifestyle:  Eat most of your calories during the day when you are active. Eliminate processed foods including packaged sweets (pies, cakes, cookies), reduce intake of potatoes, white bread, white pasta, and white rice. Look for whole grain options, oat flour or almond flour.  Each meal should contain half fruits/vegetables, one quarter protein, and one quarter carbs (no bigger than a computer mouse).  Cut down on sweet beverages. This includes juice, soda, and sweet tea. Also watch fruit intake, though this is a healthier sweet option, it still contains natural sugar! Limit to 3 servings daily.  Drink at least 1 glass of water with each meal and aim for at least 8 glasses per day  Exercise at least 150 minutes every week.

## 2023-06-14 NOTE — Progress Notes (Signed)
Subjective:     Patient ID: Tammy [name], female    DOB: [birth year], 62 y.o.   MRN: [id number]  Chief Complaint  Patient presents with   Follow-up    Follow-up on weight    HPI Obesity - Could no longer continue with Zepbound 2.5 mg due to insurance, but has alternated to Semaglutide 0.25 mg 3-4 weeks ago; tolerating well, denies side effects.  Getting from a compounding pharmacy.  Reports she is managing her diet and walking for a mile, 30-35 minutes, 3/week using an incline machine. Notes that she lost more weight initially but has plateaued around 240s.   Colonoscopy - Discussed necessity of colonoscopy due to family history. Notes that she'd like to try a cologuard first.   Mammogram - Notes that she still needs to schedule her mammogram. Discussed possibility of using MyChart to schedule.   Health Maintenance Due  Topic Date Due   Colonoscopy  Never done    Past Medical History:  Diagnosis Date   Anemia    Anxiety    Arthritis    neck and lower back - deg disc disease, fingers   Atrial fibrillation and flutter (HCC)    COVID-19 2020   Depression    Frequency of urination    GERD (gastroesophageal reflux disease)    History of seizure per pt no seizure since    11-07-2015  in setting of stress and sleep deprivation  /  negative work-up by neurologist unknown idiology   Injury of thumb, left    03-31-2016     Pelvic relaxation    Pneumonia    SUI (stress urinary incontinence, female)     Past Surgical History:  Procedure Laterality Date   ANTERIOR AND POSTERIOR REPAIR  09/25/2012   Procedure: ANTERIOR (CYSTOCELE) AND POSTERIOR REPAIR (RECTOCELE);  Surgeon: Antionette Char, MD;  Location: [facility];  Service: Gynecology;  Laterality: N/A;   ANTERIOR AND POSTERIOR REPAIR WITH SACROSPINOUS FIXATION N/A 04/12/2016   Procedure: ANTERIOR AND POSTERIOR REPAIR WITH SACROSPINOUS LIGAMENT SUSPENSION, CYSTOSCOPY;  Surgeon: Richardean Chimera, MD;  [facility];  Service: Gynecology;  Laterality: N/A;   APPENDECTOMY  1987   BREAST EXCISIONAL BIOPSY Left    No scar visable    BREAST REDUCTION SURGERY  2000  aprrox   ENDOMETRIAL ABLATION W/ NOVASURE  2011   in office   EXCISION LEFT BREAST BX  12-07-1999   benign   LAPAROSCOPIC CHOLECYSTECTOMY  02-09-2005   LAPAROSCOPIC VAGINAL HYSTERECTOMY WITH SALPINGO OOPHORECTOMY Bilateral 04/12/2016   Procedure: LAPAROSCOPIC ASSISTED VAGINAL HYSTERECTOMY WITH SALPINGO OOPHORECTOMY;  Surgeon: Richardean Chimera, MD;  Location: [facility];  Service: Gynecology;  Laterality: Bilateral;   ORIF ANKLE FRACTURE Left 05/21/2022   Procedure: OPEN REDUCTION INTERNAL FIXATION (ORIF) LEFT DISTAL FIBULA FRACTURE;  Surgeon: Nadara Mustard, MD;  Location: [facility] OR;  Service: Orthopedics;  Laterality: Left;   ORIF RADIAL FRACTURE Left 02/09/2021   Procedure: ORIF LEFT DISTAL RADIUS FRACTURE;  Surgeon: Allena Napoleon, MD;  Location: [facility];  Service: Plastics;  Laterality: Left;  90 min   REDUCTION MAMMAPLASTY Bilateral    ROUX-EN-Y GASTRIC BYPASS  06/  2002   TUBAL LIGATION  10/ 1990     Current Outpatient Medications:    buPROPion (WELLBUTRIN XL) 150 MG 24 hr tablet, Take 2 tablets (300 mg total) by mouth daily., Disp: , Rfl:    busPIRone (BUSPAR) 15 MG tablet, Take 15 mg by mouth 2 (two) times daily., Disp: ,  Rfl:    cholecalciferol (VITAMIN D3) 25 MCG (1000 UNIT) tablet, Take 1,000 Units by mouth daily., Disp: , Rfl:    clonazePAM (KLONOPIN) 1 MG tablet, Take 1 tablet (1 mg total) by mouth 2 (two) times daily., Disp: 30 tablet, Rfl: 0   cyanocobalamin (VITAMIN B12) 1000 MCG tablet, Take 1,000 mcg by mouth daily., Disp: , Rfl:    diclofenac Sodium (VOLTAREN) 1 % GEL, APPLY SPARINGLY TO AFFECTED AREA(S) ONCE DAILY EQUIVALENT TO DICLOFENAC    GEL 1%, Disp: , Rfl:    diphenoxylate-atropine (LOMOTIL) 2.5-0.025 MG tablet, Take 1 tablet by mouth 4 (four) times daily as needed for diarrhea or loose stools., Disp: 10  tablet, Rfl: 0   ELIQUIS 5 MG TABS tablet, TAKE 1 TABLET BY MOUTH TWO TIMES A DAY, Disp: 180 tablet, Rfl: 1   ferrous sulfate 325 (65 FE) MG tablet, Take 325 mg by mouth daily., Disp: , Rfl:    FLUoxetine (PROZAC) 40 MG capsule, Take 40 mg by mouth at bedtime., Disp: , Rfl:    gabapentin (NEURONTIN) 600 MG tablet, Take 1.5 tablets (900 mg total) by mouth 3 (three) times daily., Disp: 540 tablet, Rfl: 1   ketoconazole (NIZORAL) 2 % cream, Apply 1 Application topically 2 (two) times daily., Disp: 60 g, Rfl: 0   Melatonin 10 MG TABS, Take 10 mg by mouth at bedtime as needed (sleep)., Disp: , Rfl:    metoprolol succinate (TOPROL-XL) 25 MG 24 hr tablet, TAKE 1 TABLET BY MOUTH EVERY NIGHT AT BEDTIME **TAKE IF HEART RATE IS GREATER THAN 90**, Disp: 30 tablet, Rfl: 0   OLANZapine (ZYPREXA) 10 MG tablet, Take 10 mg by mouth at bedtime., Disp: , Rfl:    oxyCODONE-acetaminophen (PERCOCET/ROXICET) 5-325 MG tablet, Take 1 tablet by mouth every 8 (eight) hours as needed for severe pain., Disp: 30 tablet, Rfl: 0   potassium chloride SA (KLOR-CON M20) 20 MEQ tablet, Take 1 tablet (20 mEq total) by mouth 2 (two) times daily. Please call office to schedule an appt for further refills. Thank you, Disp: 60 tablet, Rfl: 1   Semaglutide-Weight Management 0.25 MG/0.5ML SOAJ, Inject 0.25 mg into the skin once a week., Disp: , Rfl:    triamcinolone cream (KENALOG) 0.1 %, APPLY TO THE AFFECTED AREA(S) 2 TIMES A DAY, Disp: 30 g, Rfl: 0  Allergies  Allergen Reactions   Nsaids Other (See Comments)    Post Gastric Bypass   Latex Itching, Rash and Other (See Comments)   Sulfa Antibiotics Itching and Rash   Tape Itching and Rash   ROS neg/noncontributory except as noted HPI/below      Objective:     BP 100/65   Pulse 70   Temp 98.1 F (36.7 C) (Temporal)   Resp 18   Ht 5\' 6"  (1.676 m)   Wt 244 lb 6 oz (110.8 kg)   LMP 09/30/2012   SpO2 94%   BMI 39.44 kg/m  Wt Readings from Last 3 Encounters:  06/14/23 244  lb 6 oz (110.8 kg)  03/22/23 242 lb 8 oz (110 kg)  11/08/22 257 lb 6 oz (116.7 kg)    Physical Exam   Gen: WDWN NAD HEENT: NCAT, conjunctiva not injected, sclera nonicteric NECK:  supple, no thyromegaly, no nodes, no carotid bruits CARDIAC: RRR, S1S2+, no murmur. DP 2+B LUNGS: CTAB. No wheezes ABDOMEN:  BS+, soft, NTND, No HSM, no masses EXT:  no edema MSK: no gross abnormalities.  NEURO: A&O x3.  CN II-XII intact.  PSYCH: normal  mood. Good eye contact     Assessment & Plan:  AF (paroxysmal atrial fibrillation) (HCC)  Class 2 severe obesity due to excess calories with serious comorbidity and body mass index (BMI) of 39.0 to 39.9 in adult [facility])  Generalized anxiety disorder  Screening for colorectal cancer -     Cologuard  1.  Obesity-patient has had a gastric procedure in the past.  Currently working on diet/exercise.  Insurance did not cover GLP-1's so she is getting compounded semaglutide.  Also has A-fib, chronic anxiety for which she takes multiple medications which are not conducive to weight loss.  Continue current treatment. 2.  Screening for colorectal cancer-although there is a family history of colon cancer, patient declines colonoscopy but she is willing to do Cologuard.  Advise colonoscopy is the recommended guideline, however avoidance of that is more detrimental so will order Cologuard and she is very aware that if it is positive she will need a colonoscopy  Return in about 6 months (around 12/12/2023) for annual physical.   I,Emily Lagle,acting as a scribe for Angelena Sole, MD.,have documented all relevant documentation on the behalf of Angelena Sole, MD,as directed by  Angelena Sole, MD while in the presence of Angelena Sole, MD.   I, Angelena Sole, MD, have reviewed all documentation for this visit. The documentation on 06/14/23 for the exam, diagnosis, procedures, and orders are all accurate and complete.   Angelena Sole, MD

## 2023-06-15 ENCOUNTER — Other Ambulatory Visit: Payer: Self-pay | Admitting: *Deleted

## 2023-06-15 MED ORDER — METOPROLOL SUCCINATE ER 25 MG PO TB24: ORAL_TABLET | ORAL | 0 refills | Status: DC

## 2023-07-07 LAB — COLOGUARD: Cologuard: POSITIVE — AB

## 2023-07-09 ENCOUNTER — Other Ambulatory Visit: Payer: Self-pay | Admitting: Cardiology

## 2023-07-09 DIAGNOSIS — I480 Paroxysmal atrial fibrillation: Secondary | ICD-10-CM

## 2023-07-11 ENCOUNTER — Other Ambulatory Visit: Payer: Self-pay

## 2023-07-11 LAB — COLOGUARD: COLOGUARD: POSITIVE — AB

## 2023-07-11 MED ORDER — METOPROLOL SUCCINATE ER 25 MG PO TB24: ORAL_TABLET | ORAL | 0 refills | Status: DC

## 2023-07-11 NOTE — Telephone Encounter (Signed)
Prescription refill request for Eliquis received. Indication:afib Last office visit:needs appt NWG:NFAOZ labs Age: 62 Weight:110.8  kg  Prescription refilled

## 2023-07-11 NOTE — Progress Notes (Signed)
Please let her know Cologuard is positive.  This does not mean that she has colon cancer.  She needs to do a colonoscopy.  If agreeable, refer to GI

## 2023-07-12 ENCOUNTER — Other Ambulatory Visit: Payer: Self-pay | Admitting: *Deleted

## 2023-07-12 ENCOUNTER — Encounter: Payer: Self-pay | Admitting: Family Medicine

## 2023-07-12 DIAGNOSIS — R195 Other fecal abnormalities: Secondary | ICD-10-CM

## 2023-07-13 ENCOUNTER — Encounter: Payer: Self-pay | Admitting: Nurse Practitioner

## 2023-07-18 ENCOUNTER — Other Ambulatory Visit: Payer: Self-pay

## 2023-07-18 MED ORDER — POTASSIUM CHLORIDE CRYS ER 20 MEQ PO TBCR: 20.0000 meq | EXTENDED_RELEASE_TABLET | Freq: Two times a day (BID) | ORAL | 0 refills | Status: DC

## 2023-07-22 ENCOUNTER — Other Ambulatory Visit: Payer: Self-pay | Admitting: Cardiology

## 2023-08-02 ENCOUNTER — Other Ambulatory Visit: Payer: Self-pay | Admitting: Cardiology

## 2023-08-03 ENCOUNTER — Other Ambulatory Visit: Payer: Self-pay | Admitting: Cardiology

## 2023-08-16 ENCOUNTER — Other Ambulatory Visit: Payer: Self-pay | Admitting: Cardiology

## 2023-08-16 DIAGNOSIS — I480 Paroxysmal atrial fibrillation: Secondary | ICD-10-CM

## 2023-08-16 NOTE — Telephone Encounter (Signed)
Prescription refill request for Eliquis received. Indication: Afib  Last office visit: 05/24/22 Louanne Skye)  Scr: 0.77 (04/29/22)  Age: 62 Weight: 110.8kg  Labs and office visit overdue.   Called pt, no answer. Left message on voicemail.  Sent schedulers a message to contact pt and schedule appt.

## 2023-08-17 NOTE — Telephone Encounter (Signed)
Called pt, no answer. Left message on voicemail.

## 2023-08-17 NOTE — Telephone Encounter (Signed)
Pt has scheduled appt with Dr Antoine Poche on 10/28/23. Labs can be drawn at upcoming appt. Refill sent.

## 2023-09-13 ENCOUNTER — Other Ambulatory Visit: Payer: Self-pay

## 2023-09-13 MED ORDER — POTASSIUM CHLORIDE CRYS ER 20 MEQ PO TBCR: 20.0000 meq | EXTENDED_RELEASE_TABLET | Freq: Two times a day (BID) | ORAL | 0 refills | Status: DC

## 2023-10-17 ENCOUNTER — Ambulatory Visit: Payer: BLUE CROSS/BLUE SHIELD | Admitting: Nurse Practitioner

## 2023-10-25 ENCOUNTER — Other Ambulatory Visit: Payer: Self-pay | Admitting: Cardiology

## 2023-10-26 ENCOUNTER — Encounter: Payer: Self-pay | Admitting: Physician Assistant

## 2023-10-26 ENCOUNTER — Ambulatory Visit: Payer: BLUE CROSS/BLUE SHIELD | Admitting: Physician Assistant

## 2023-10-26 ENCOUNTER — Telehealth: Payer: Self-pay

## 2023-10-26 VITALS — BP 130/80 | HR 86 | Ht 66.0 in | Wt 251.0 lb

## 2023-10-26 DIAGNOSIS — R195 Other fecal abnormalities: Secondary | ICD-10-CM

## 2023-10-26 DIAGNOSIS — I480 Paroxysmal atrial fibrillation: Secondary | ICD-10-CM

## 2023-10-26 DIAGNOSIS — Z7901 Long term (current) use of anticoagulants: Secondary | ICD-10-CM

## 2023-10-26 MED ORDER — SUTAB 1479-225-188 MG PO TABS: ORAL_TABLET | ORAL | 0 refills | Status: AC

## 2023-10-26 NOTE — Progress Notes (Signed)
Chief Complaint: Discuss colonoscopy in a patient on chronic anticoagulation for a positive Cologuard  HPI:    Tammy [name] is a 63 year old female with a past medical history as listed below including reflux, Roux-en-Y gastric bypas, history of seizure, on chronic anticoagulation for A-fib (02/20/2020 echo with LVEF 60-65%), known to Dr. Leone Payor, who presents to clinic today to discuss a repeat colonoscopy on chronic anticoagulation.    10/10/2014 office visit to discuss an EGD for dysphagia and colonoscopy.  It does not look like patient had these done.    05/24/2022 last office visit with cardiology.  At that time continued on Eliquis 5 mg twice daily.  Patient to follow-up in 12 months.    07/07/2023 Cologuard positive.    Today, patient presents to clinic accompanied by her husband.  She explains that she had a Cologuard recently which was positive.  She has never had a colonoscopy done before even though it was discussed in the past.  Denies any acute GI complaints or concerns.  Occasionally has reflux once a month which is resolved with Tums.    Denies fever, chills, weight loss or change in bowel habits.  Past Medical History:  Diagnosis Date   Anemia    Anxiety    Arthritis    neck and lower back - deg disc disease, fingers   Atrial fibrillation and flutter (HCC)    COVID-19 2020   Depression    Frequency of urination    GERD (gastroesophageal reflux disease)    History of seizure per pt no seizure since    11-07-2015  in setting of stress and sleep deprivation  /  negative work-up by neurologist unknown idiology   Injury of thumb, left    03-31-2016     Pelvic relaxation    Pneumonia    SUI (stress urinary incontinence, female)     Past Surgical History:  Procedure Laterality Date   ANTERIOR AND POSTERIOR REPAIR  09/25/2012   Procedure: ANTERIOR (CYSTOCELE) AND POSTERIOR REPAIR (RECTOCELE);  Surgeon: Antionette Char, MD;  Location: [facility];  Service: Gynecology;   Laterality: N/A;   ANTERIOR AND POSTERIOR REPAIR WITH SACROSPINOUS FIXATION N/A 04/12/2016   Procedure: ANTERIOR AND POSTERIOR REPAIR WITH SACROSPINOUS LIGAMENT SUSPENSION, CYSTOSCOPY;  Surgeon: Richardean Chimera, MD;  Location: [facility];  Service: Gynecology;  Laterality: N/A;   APPENDECTOMY  1987   BREAST EXCISIONAL BIOPSY Left    No scar visable    BREAST REDUCTION SURGERY  2000  aprrox   ENDOMETRIAL ABLATION W/ NOVASURE  2011   in office   EXCISION LEFT BREAST BX  12-07-1999   benign   LAPAROSCOPIC CHOLECYSTECTOMY  02-09-2005   LAPAROSCOPIC VAGINAL HYSTERECTOMY WITH SALPINGO OOPHORECTOMY Bilateral 04/12/2016   Procedure: LAPAROSCOPIC ASSISTED VAGINAL HYSTERECTOMY WITH SALPINGO OOPHORECTOMY;  Surgeon: Richardean Chimera, MD;  Location: [facility];  Service: Gynecology;  Laterality: Bilateral;   ORIF ANKLE FRACTURE Left 05/21/2022   Procedure: OPEN REDUCTION INTERNAL FIXATION (ORIF) LEFT DISTAL FIBULA FRACTURE;  Surgeon: Nadara Mustard, MD;  Location: [facility];  Service: Orthopedics;  Laterality: Left;   ORIF RADIAL FRACTURE Left 02/09/2021   Procedure: ORIF LEFT DISTAL RADIUS FRACTURE;  Surgeon: Allena Napoleon, MD;  Location: [facility];  Service: Plastics;  Laterality: Left;  90 min   REDUCTION MAMMAPLASTY Bilateral    ROUX-EN-Y GASTRIC BYPASS  06/  2002   TUBAL LIGATION  10/ 1990    Current Outpatient Medications  Medication Sig Dispense Refill   apixaban (  ELIQUIS) 5 MG TABS tablet Take 1 tablet (5 mg total) by mouth 2 (two) times daily. 60 tablet 2   buPROPion (WELLBUTRIN XL) 150 MG 24 hr tablet Take 2 tablets (300 mg total) by mouth daily.     busPIRone (BUSPAR) 15 MG tablet Take 15 mg by mouth 2 (two) times daily.     cholecalciferol (VITAMIN D3) 25 MCG (1000 UNIT) tablet Take 1,000 Units by mouth daily.     clonazePAM (KLONOPIN) 1 MG tablet Take 1 tablet (1 mg total) by mouth 2 (two) times daily. 30 tablet 0   cyanocobalamin (VITAMIN B12) 1000 MCG tablet Take 1,000 mcg  by mouth daily.     diclofenac Sodium (VOLTAREN) 1 % GEL APPLY SPARINGLY TO AFFECTED AREA(S) ONCE DAILY EQUIVALENT TO DICLOFENAC    GEL 1%     diphenoxylate-atropine (LOMOTIL) 2.5-0.025 MG tablet Take 1 tablet by mouth 4 (four) times daily as needed for diarrhea or loose stools. 10 tablet 0   ferrous sulfate 325 (65 FE) MG tablet Take 325 mg by mouth daily.     FLUoxetine (PROZAC) 40 MG capsule Take 40 mg by mouth at bedtime.     gabapentin (NEURONTIN) 600 MG tablet Take 1.5 tablets (900 mg total) by mouth 3 (three) times daily. 540 tablet 1   ketoconazole (NIZORAL) 2 % cream Apply 1 Application topically 2 (two) times daily. 60 g 0   Melatonin 10 MG TABS Take 10 mg by mouth at bedtime as needed (sleep).     metoprolol succinate (TOPROL-XL) 25 MG 24 hr tablet TAKE 1 TABLET BY MOUTH EVERY NIGHT AT BEDTIME **TAKE IF HEART RATE IS GREATER THAN 90** 15 tablet 0   OLANZapine (ZYPREXA) 10 MG tablet Take 10 mg by mouth at bedtime.     oxyCODONE-acetaminophen (PERCOCET/ROXICET) 5-325 MG tablet Take 1 tablet by mouth every 8 (eight) hours as needed for severe pain. 30 tablet 0   potassium chloride SA (KLOR-CON M20) 20 MEQ tablet Take 1 tablet (20 mEq total) by mouth 2 (two) times daily. Please call [phone] to schedule an appointment for future refills. Thank you. Final attempt. 90 tablet 0   Semaglutide-Weight Management 0.25 MG/0.5ML SOAJ Inject 0.25 mg into the skin once a week.     triamcinolone cream (KENALOG) 0.1 % APPLY TO THE AFFECTED AREA(S) 2 TIMES A DAY 30 g 0   No current facility-administered medications for this visit.    Allergies as of 10/26/2023 - Review Complete 06/14/2023  Allergen Reaction Noted   Nsaids Other (See Comments) 10/31/2016   Latex Itching, Rash, and Other (See Comments) 01/20/2011   Sulfa antibiotics Itching and Rash 04/01/2016   Tape Itching and Rash 09/13/2012    Family History  Problem Relation Age of Onset   Hypertension Mother    Hearing loss Mother     Arthritis Mother    Colon polyps Mother    Hearing loss Father    Arthritis Father    Heart disease Father 43       CABG   Depression Sister    Arthritis Sister    Depression Daughter    Arthritis Daughter    Alcohol abuse Daughter    Breast cancer Maternal Aunt    Diabetes Maternal Aunt        Type I   Stomach cancer Maternal Uncle    Colon cancer Maternal Uncle        x3   Diabetes Maternal Uncle        x5 -  Type II   Stomach cancer Maternal Grandmother    Diabetes Maternal Grandmother    Heart disease Maternal Grandmother    Heart disease Paternal Grandmother    Esophageal cancer Neg Hx    Gallbladder disease Neg Hx    Seizures Neg Hx     Social History   Socioeconomic History   Marital status: Married    Spouse name: Tarasha Dimartino   Number of children: 3   Years of education: 16   Highest education level: Not on file  Occupational History   Occupation: [employer]  Tobacco Use   Smoking status: Never   Smokeless tobacco: Never  Vaping Use   Vaping status: Never Used  Substance and Sexual Activity   Alcohol use: No    Comment: drinks daily - none since 08/2012 per pt.   Drug use: No   Sexual activity: Yes    Birth control/protection: Surgical  Other Topics Concern   Not on file  Social History Narrative   Lives with spouse   Caffeine use: 2-3 cups per day   [employer] business analyst   Social Drivers of Health   Financial Resource Strain: Not on file  Food Insecurity: Not on file  Transportation Needs: Not on file  Physical Activity: Not on file  Stress: Not on file  Social Connections: Unknown (02/12/2022)   Received from [facility] [place], [facility]   Social Network    Social Network: Not on file  Intimate Partner Violence: Unknown (01/05/2022)   Received from [employer], [facility]   HITS    Physically Hurt: Not on file    Insult or Talk Down To: Not on file    Threaten Physical Harm: Not on file    Scream or Curse: Not on file     Review of Systems:    Constitutional: No weight loss, fever or chills Skin: No rash  Cardiovascular: No chest pain Respiratory: No SOB  Gastrointestinal: See HPI and otherwise negative Genitourinary: No dysuria  Neurological: No headache, dizziness or syncope Musculoskeletal: No new muscle or joint pain Hematologic: No bleeding  Psychiatric: No history of depression or anxiety   Physical Exam:  Vital signs: BP 130/80 (BP Location: Left Arm, Patient Position: Sitting, Cuff Size: Large)   Pulse 86   Ht 5\' 6"  (1.676 m)   Wt 251 lb (113.9 kg)   LMP 09/30/2012   SpO2 91%   BMI 40.51 kg/m    Constitutional:   Pleasant obese Caucasian female appears to be in NAD, Well developed, Well nourished, alert and cooperative Head:  Normocephalic and atraumatic. Eyes:   PEERL, EOMI. No icterus. Conjunctiva pink. Ears:  Normal auditory acuity. Neck:  Supple Throat: Oral cavity and pharynx without inflammation, swelling or lesion.  Respiratory: Respirations even and unlabored. Lungs clear to auscultation bilaterally.   No wheezes, crackles, or rhonchi.  Cardiovascular: Normal S1, S2. No MRG. Regular rate and rhythm. No peripheral edema, cyanosis or pallor.  Gastrointestinal:  Soft, nondistended, nontender. No rebound or guarding. Normal bowel sounds. No appreciable masses or hepatomegaly. Rectal:  Not performed.  Msk:  Symmetrical without gross deformities. Without edema, no deformity or joint abnormality.  Neurologic:  Alert and  oriented x4;  grossly normal neurologically.  Skin:   Dry and intact without significant lesions or rashes. Psychiatric: Demonstrates good judgement and reason without abnormal affect or behaviors.  RELEVANT LABS AND IMAGING: CBC    Component Value Date/Time   WBC 8.9 04/29/2022 0723   RBC 4.93  04/29/2022 0723   HGB 14.4 04/29/2022 0723   HCT 43.8 04/29/2022 0723   PLT 248 04/29/2022 0723   MCV 88.8 04/29/2022 0723   MCH 29.2 04/29/2022 0723   MCHC 32.9  04/29/2022 0723   RDW 14.0 04/29/2022 0723   LYMPHSABS 1.6 04/29/2022 0723   MONOABS 0.4 04/29/2022 0723   EOSABS 0.0 04/29/2022 0723   BASOSABS 0.0 04/29/2022 0723    CMP     Component Value Date/Time   NA 142 04/29/2022 0723   K 4.0 04/29/2022 0723   CL 105 04/29/2022 0723   CO2 22 04/29/2022 0723   GLUCOSE 99 04/29/2022 0723   BUN 12 04/29/2022 0723   CREATININE 0.77 04/29/2022 0723   CALCIUM 10.1 04/29/2022 0723   PROT 7.2 04/29/2022 0723   ALBUMIN 4.6 04/29/2022 0723   AST 23 04/29/2022 0723   ALT 12 04/29/2022 0723   ALKPHOS 65 04/29/2022 0723   BILITOT 1.0 04/29/2022 0723   GFRNONAA >60 04/29/2022 0723   GFRAA >60 03/26/2020 1631    Assessment: 1.  Positive Cologuard: Discovered 3 months ago, no prior colonoscopy 2.  A-fib on chronic anticoagulation with Eliquis  Plan: 1.  Discussed what a Cologuard tests for.  Explained why she needs a colonoscopy.  Answered questions. 2.  Patient scheduled for diagnostic colonoscopy in the [facility] with Dr. Leone Payor.  Did provide the patient a detailed list of risks for the procedure and she agrees to proceed. Patient is appropriate for endoscopic procedure(s) in the ambulatory ([facility]) setting. 3.  Patient advised to hold her Eliquis for 2 days prior to time of procedure.  We will communicate with her prescribing physician to ensure this is acceptable for her. 4.  Patient to follow in clinic with [place] per recommendations after time of procedure.  Hyacinth Meeker, PA-C [facility] 10/26/2023, 11:28 AM  Cc: Tammy Sow, MD

## 2023-10-26 NOTE — Telephone Encounter (Signed)
   Name: Tammy [name]  DOB: May 10, 1961  MRN: [id number]  Primary Cardiologist: Rollene Rotunda, MD  Chart reviewed as part of pre-operative protocol coverage. The patient has an upcoming visit scheduled with Dr. Antoine Poche on 10/24/2023 at which time clearance can be addressed in case there are any issues that would impact surgical recommendations.   I added preop FYI to appointment note so that provider is aware to address at time of outpatient visit.  Per office protocol the cardiology provider should forward their finalized clearance decision and recommendations regarding antiplatelet therapy to the requesting party below.    I will route this message as FYI to requesting party and remove this message from the preop box as separate preop APP input not needed at this time.   Please call with any questions.  Napoleon Form, Leodis Rains, NP  10/26/2023, 5:25 PM

## 2023-10-26 NOTE — Patient Instructions (Addendum)
  You have been scheduled for a colonoscopy. Please follow written instructions given to you at your visit today.   Please pick up your prep supplies at the pharmacy within the next 1-3 days.  If you use inhalers (even only as needed), please bring them with you on the day of your procedure.  DO NOT TAKE 7 DAYS PRIOR TO TEST- Trulicity (dulaglutide) Ozempic, Wegovy (semaglutide) Mounjaro (tirzepatide) Bydureon Bcise (exanatide extended release)  DO NOT TAKE 1 DAY PRIOR TO YOUR TEST Rybelsus (semaglutide) Adlyxin (lixisenatide) Victoza (liraglutide) Byetta (exanatide) ___________________________________________________________________________  Tammy [name] will receive your bowel preparation through [facility], which ensures the lowest copay and home delivery, with outreach via text or call from an [phone] number. Please respond promptly to avoid rescheduling. If you are interested in alternative options or have any questions please contact them at [phone]  Your Provider Has Sent Your Bowel Prep Regimen To [facility] What to expect. [facility] will contact you to verify your information and collect your copay, if applicable. Enjoy the comfort of your home while we deliver your prescription to you, free of any shipping charges. Fast, FREE delivery or shipping. [facility] accepts all major insurance benefits and applies discounts & coupons  Have additional questions? [facility]'s patient care team is always here to help.  Chat: [email or web address] Call: [phone] Email: [email or web address] NCPDP: [id number] How will we contact you? Welcome Phone call  a Welcome text and a Checkout link in a text Texts you receive from [phone] Are Not Spam.   *To set up delivery, you must complete the checkout process via link or speak to one of our patient care representatives. If we are unable to reach you, your prescription may be  delayed.   _______________________________________________________  If your blood pressure at your visit was 140/90 or greater, please contact your primary care physician to follow up on this.  _______________________________________________________  If you are age 65 or older, your body mass index should be between 23-30. Your Body mass index is 40.51 kg/m. If this is out of the aforementioned range listed, please consider follow up with your Primary Care Provider.  If you are age 108 or younger, your body mass index should be between 19-25. Your Body mass index is 40.51 kg/m. If this is out of the aformentioned range listed, please consider follow up with your Primary Care Provider.   ________________________________________________________  The [facility] providers would like to encourage you to use [facility] to communicate with providers for non-urgent requests or questions.  Due to long hold times on the telephone, sending your provider a message by [facility] may be a faster and more efficient way to get a response.  Please allow 48 business hours for a response.  Please remember that this is for non-urgent requests.  _______________________________________________________ It was a pleasure to see you today!  Thank you for trusting me with your gastrointestinal care!

## 2023-10-26 NOTE — Telephone Encounter (Signed)
 [facility] Pre-operative Risk Assessment     Request for surgical clearance:     Endoscopy Procedure  What type of surgery is being performed?     Colonoscopy   When is this surgery scheduled?     12/09/2023  What type of clearance is required ?   Pharmacy  Are there any medications that need to be held prior to surgery and how long? Eliquis- 2 days   Practice name and name of physician performing surgery?      [facility]  What is your office phone and fax number?      Phone- [phone]  Fax- [phone]  Anesthesia type (None, local, MAC, general) ?       MAC   Please route your response to Azaela Caracci

## 2023-10-26 NOTE — Telephone Encounter (Signed)
Has not been seen, nor had labs since 2023  Need updated CBC, BMET

## 2023-10-27 DIAGNOSIS — I10 Essential (primary) hypertension: Secondary | ICD-10-CM | POA: Insufficient documentation

## 2023-10-27 NOTE — Progress Notes (Deleted)
  Cardiology Office Note:   Date:  10/27/2023  ID:  Tammy [name], DOB [birth year], MRN [id number] PCP: Jeani Sow, MD  [facility] Providers Cardiologist:  Rollene Rotunda, MD {  History of Present Illness:   Tammy [name] is a 63 y.o. female with a history of atrial fibrillation.  Her atrial fibrillation initially followed an episode of community-acquired pneumonia.  She was found to have atrial flutter on outpatient monitoring.  She has been diagnosed and treated for obstructive sleep apnea.  She is been managed with anticoagulation.    Since I last saw her ***   ***  she occasionally feels palpitations.  These are very brief.  She is not having any pain.  Having any chest pressure, neck or arm discomfort.  She has no shortness of breath, PND or orthopnea.  She has not been as active as I would like.  She has been tolerating her anticoagulation.    ROS: ***  Studies Reviewed:    EKG:       ***  Risk Assessment/Calculations:   {Does this patient have ATRIAL FIBRILLATION?[phone]}          Physical Exam:   VS:  LMP 09/30/2012    Wt Readings from Last 3 Encounters:  10/26/23 251 lb (113.9 kg)  06/14/23 244 lb 6 oz (110.8 kg)  03/22/23 242 lb 8 oz (110 kg)     GEN: Well nourished, well developed in no acute distress NECK: No JVD; No carotid bruits CARDIAC: ***RR, *** murmurs, rubs, gallops RESPIRATORY:  Clear to auscultation without rales, wheezing or rhonchi  ABDOMEN: Soft, non-tender, non-distended EXTREMITIES:  No edema; No deformity   ASSESSMENT AND PLAN:   PAF/FLUTTER:    Ms. [name] has a CHA2DS2 - VASc score of 2.  ***  She tolerates anticoagulation.  Her paroxysms are very infrequent and she is not having any sustained tachyarrhythmias.  No change in therapy.   SLEEP APNEA:   ***  She is unable to use CPAP.  She has tried multiple masks.   HTN: Her blood pressure is ***  has transiently been elevated but it is well controlled today.  Of  asked her to keep an eye on this at home.  She will continue the meds as listed.       Follow up ***  Signed, Rollene Rotunda, MD

## 2023-10-28 ENCOUNTER — Ambulatory Visit: Payer: BLUE CROSS/BLUE SHIELD | Admitting: Cardiology

## 2023-10-28 DIAGNOSIS — I480 Paroxysmal atrial fibrillation: Secondary | ICD-10-CM

## 2023-10-28 DIAGNOSIS — G4730 Sleep apnea, unspecified: Secondary | ICD-10-CM

## 2023-10-28 DIAGNOSIS — I10 Essential (primary) hypertension: Secondary | ICD-10-CM

## 2023-11-08 ENCOUNTER — Other Ambulatory Visit: Payer: Self-pay

## 2023-11-08 ENCOUNTER — Emergency Department (HOSPITAL_BASED_OUTPATIENT_CLINIC_OR_DEPARTMENT_OTHER): Payer: BLUE CROSS/BLUE SHIELD | Admitting: Radiology

## 2023-11-08 ENCOUNTER — Other Ambulatory Visit: Payer: Self-pay | Admitting: Cardiology

## 2023-11-08 ENCOUNTER — Emergency Department (HOSPITAL_BASED_OUTPATIENT_CLINIC_OR_DEPARTMENT_OTHER): Payer: BLUE CROSS/BLUE SHIELD

## 2023-11-08 ENCOUNTER — Encounter (HOSPITAL_BASED_OUTPATIENT_CLINIC_OR_DEPARTMENT_OTHER): Payer: Self-pay | Admitting: Emergency Medicine

## 2023-11-08 ENCOUNTER — Emergency Department (HOSPITAL_BASED_OUTPATIENT_CLINIC_OR_DEPARTMENT_OTHER)
Admission: EM | Admit: 2023-11-08 | Discharge: 2023-11-08 | Disposition: A | Discharge status: Home / Self Care | Payer: BLUE CROSS/BLUE SHIELD | Attending: Emergency Medicine | Admitting: Emergency Medicine

## 2023-11-08 DIAGNOSIS — S42251A Displaced fracture of greater tuberosity of right humerus, initial encounter for closed fracture: Secondary | ICD-10-CM | POA: Diagnosis not present

## 2023-11-08 DIAGNOSIS — Z7901 Long term (current) use of anticoagulants: Secondary | ICD-10-CM | POA: Insufficient documentation

## 2023-11-08 DIAGNOSIS — Z1152 Encounter for screening for COVID-19: Secondary | ICD-10-CM | POA: Insufficient documentation

## 2023-11-08 DIAGNOSIS — R569 Unspecified convulsions: Secondary | ICD-10-CM | POA: Diagnosis not present

## 2023-11-08 DIAGNOSIS — S42141A Displaced fracture of glenoid cavity of scapula, right shoulder, initial encounter for closed fracture: Secondary | ICD-10-CM | POA: Diagnosis not present

## 2023-11-08 DIAGNOSIS — M25511 Pain in right shoulder: Secondary | ICD-10-CM | POA: Diagnosis present

## 2023-11-08 DIAGNOSIS — X509XXA Other and unspecified overexertion or strenuous movements or postures, initial encounter: Secondary | ICD-10-CM | POA: Insufficient documentation

## 2023-11-08 DIAGNOSIS — I480 Paroxysmal atrial fibrillation: Secondary | ICD-10-CM

## 2023-11-08 DIAGNOSIS — S43004A Unspecified dislocation of right shoulder joint, initial encounter: Secondary | ICD-10-CM | POA: Diagnosis not present

## 2023-11-08 DIAGNOSIS — Z91040 Latex allergy status: Secondary | ICD-10-CM | POA: Diagnosis not present

## 2023-11-08 LAB — COMPREHENSIVE METABOLIC PANEL
ALT: 38 U/L (ref 0–44)
AST: 43 U/L — ABNORMAL HIGH (ref 15–41)
Albumin: 4.2 g/dL (ref 3.5–5.0)
Alkaline Phosphatase: 85 U/L (ref 38–126)
Anion gap: 20 — ABNORMAL HIGH (ref 5–15)
BUN: 10 mg/dL (ref 6–20)
CO2: 15 mmol/L — ABNORMAL LOW (ref 22–32)
Calcium: 8.8 mg/dL — ABNORMAL LOW (ref 8.9–10.3)
Chloride: 102 mmol/L (ref 98–111)
Creatinine, Ser: 0.96 mg/dL (ref 0.44–1.00)
GFR, Estimated: >60 mL/min (ref >60)
Glucose, Bld: 128 mg/dL — ABNORMAL HIGH (ref 70–99)
Potassium: 4 mmol/L (ref 3.5–5.1)
Sodium: 137 mmol/L (ref 135–145)
Total Bilirubin: 0.7 mg/dL (ref 0.0–1.2)
Total Protein: 6.9 g/dL (ref 6.5–8.1)

## 2023-11-08 LAB — MAGNESIUM: Magnesium: 1.9 mg/dL (ref 1.7–2.4)

## 2023-11-08 LAB — CBC WITH DIFFERENTIAL/PLATELET
Abs Immature Granulocytes: 0.07 10*3/uL (ref 0.00–0.07)
Basophils Absolute: 0 10*3/uL (ref 0.0–0.1)
Basophils Relative: 0 %
Eosinophils Absolute: 0.1 10*3/uL (ref 0.0–0.5)
Eosinophils Relative: 1 %
HCT: 41.8 % (ref 36.0–46.0)
Hemoglobin: 13.3 g/dL (ref 12.0–15.0)
Immature Granulocytes: 1 %
Lymphocytes Relative: 13 %
Lymphs Abs: 1.6 10*3/uL (ref 0.7–4.0)
MCH: 29.5 pg (ref 26.0–34.0)
MCHC: 31.8 g/dL (ref 30.0–36.0)
MCV: 92.7 fL (ref 80.0–100.0)
Monocytes Absolute: 0.6 10*3/uL (ref 0.1–1.0)
Monocytes Relative: 5 %
Neutro Abs: 9.8 10*3/uL — ABNORMAL HIGH (ref 1.7–7.7)
Neutrophils Relative %: 80 %
Platelets: 279 10*3/uL (ref 150–400)
RBC: 4.51 MIL/uL (ref 3.87–5.11)
RDW: 13.9 % (ref 11.5–15.5)
WBC: 12.1 10*3/uL — ABNORMAL HIGH (ref 4.0–10.5)
nRBC: 0 % (ref 0.0–0.2)

## 2023-11-08 LAB — RESP PANEL BY RT-PCR (RSV, FLU A&B, COVID)  RVPGX2
Influenza A by PCR: NEGATIVE
Influenza B by PCR: NEGATIVE
Resp Syncytial Virus by PCR: NEGATIVE
SARS Coronavirus 2 by RT PCR: NEGATIVE

## 2023-11-08 LAB — CBG MONITORING, ED: Glucose-Capillary: 139 mg/dL — ABNORMAL HIGH (ref 70–99)

## 2023-11-08 MED ORDER — PROPOFOL 10 MG/ML IV BOLUS
INTRAVENOUS | Status: AC | PRN
  Administered 2023-11-08 (×3): 50 ug via INTRAVENOUS

## 2023-11-08 MED ORDER — FENTANYL CITRATE PF 50 MCG/ML IJ SOSY
PREFILLED_SYRINGE | INTRAMUSCULAR | Status: AC
  Administered 2023-11-08: 50 ug via INTRAVENOUS
  Filled 2023-11-08: qty 1

## 2023-11-08 MED ORDER — PROPOFOL 10 MG/ML IV BOLUS
1.0000 mg/kg | Freq: Once | INTRAVENOUS | Status: DC
  Filled 2023-11-08: qty 20

## 2023-11-08 MED ORDER — ONDANSETRON HCL 4 MG/2ML IJ SOLN
4.0000 mg | Freq: Once | INTRAMUSCULAR | Status: AC
  Administered 2023-11-08: 4 mg via INTRAVENOUS
  Filled 2023-11-08: qty 2

## 2023-11-08 MED ORDER — FENTANYL CITRATE PF 50 MCG/ML IJ SOSY: 50.0000 ug | PREFILLED_SYRINGE | Freq: Once | INTRAMUSCULAR | Status: AC

## 2023-11-08 MED ORDER — LEVETIRACETAM 500 MG PO TABS: 500.0000 mg | ORAL_TABLET | Freq: Two times a day (BID) | ORAL | 2 refills | Status: DC

## 2023-11-08 MED ORDER — OXYCODONE-ACETAMINOPHEN 5-325 MG PO TABS: 1.0000 | ORAL_TABLET | Freq: Four times a day (QID) | ORAL | 0 refills | Status: DC | PRN

## 2023-11-08 MED ORDER — OXYCODONE-ACETAMINOPHEN 5-325 MG PO TABS
1.0000 | ORAL_TABLET | Freq: Once | ORAL | Status: AC
  Administered 2023-11-08: 1 via ORAL
  Filled 2023-11-08: qty 1

## 2023-11-08 MED ORDER — FENTANYL CITRATE PF 50 MCG/ML IJ SOSY
50.0000 ug | PREFILLED_SYRINGE | Freq: Once | INTRAMUSCULAR | Status: AC
  Administered 2023-11-08: 50 ug via INTRAVENOUS
  Filled 2023-11-08: qty 1

## 2023-11-08 MED ORDER — LEVETIRACETAM IN NACL 500 MG/100ML IV SOLN
500.0000 mg | Freq: Once | INTRAVENOUS | Status: AC
  Administered 2023-11-08: 500 mg via INTRAVENOUS
  Filled 2023-11-08: qty 100

## 2023-11-08 MED ORDER — SODIUM CHLORIDE 0.9 % IV BOLUS
1000.0000 mL | Freq: Once | INTRAVENOUS | Status: AC
  Administered 2023-11-08: 1000 mL via INTRAVENOUS

## 2023-11-08 NOTE — ED Notes (Signed)
This RT at bedside during procedural sedation. Patient on [facility] with ETCO2 monitoring during procedure. Pt's SpO2, RR, and End Tidal WNL throughout procedure. No further respiratory interventions used.

## 2023-11-08 NOTE — ED Notes (Signed)
 ED Provider at bedside.

## 2023-11-08 NOTE — ED Notes (Signed)
Patient being maintained on cardiac monitor. ETCO2 monitor in place. AMBU bag at head of bed, with suction available.

## 2023-11-08 NOTE — Discharge Instructions (Addendum)
 Follow-up with orthopedics regarding your right shoulder.  Your x-ray showed evidence of fracture in addition to dislocation.  Pain medicine has been prescribed.  Please maintain a sling.   Department of Motor Vehicle (DMV) of [place] regulations for seizures - It is the patient's responsibility to report the incidence of the seizure in the state of [place]. Tammy [name]  has no statutory provision requiring physicians to report patients diagnosed with epilepsy or seizures to a central state agency.  The recommended DMV regulation requirement for a driver in [place] for an individual with a seizure is that they be seizure-free for 6-12 months. However, the DMV may consider the following exceptions to this general rule where: (1) a physician-directed change in medication causes a seizure and the individual immediately resumes the previous therapy which controlled seizures; (2) there is a history of nocturnal seizures or seizures which do not involve loss of consciousness, loss of control of motor function, or loss of appropriate sensation and information process; and (3) an individual has a seizure disorder preceded by an aura (warning) lasting 2-3 minutes. While the [facility] may also give consideration to other unusual circumstances which may affect the general requirement that drivers be seizure-free for 6-12 months, interpretation of these circumstances and assignment of restrictions is at the discretion of the Medical Advisor. The DMV also considers compliance with medical therapy essential for safe driving. [The [place]  Physician's Guide to [employer] (June, 1995 ed.)] The Department learns of an individual's condition by inquiring on the application form or renewal form, a physician's report to the [facility], an accident report or from correspondence from the individual. The person may be required to submit a Medical Report Form either annually or semi-annually.

## 2023-11-08 NOTE — ED Notes (Signed)
Does not respond to verbal stimuli. Airway maintained on own ability. Grimace with manipulation. Pain minimal.

## 2023-11-08 NOTE — ED Notes (Signed)
To be transported home by Melton Alar. At bedside.

## 2023-11-08 NOTE — ED Triage Notes (Signed)
Family report dislocated shoulder. Upon arrival in POV to ED has seizure lasting about a minute. Airway maintained with jaw-thrust in car. Transferred to  stretcher via 4 man lift. EDP to bedside. Pt Alert upon arrival to room.

## 2023-11-08 NOTE — ED Provider Notes (Signed)
 [place] EMERGENCY DEPARTMENT AT [facility] Provider Note   CSN: [id number] Arrival date & time: 11/08/23  1812     History  Chief Complaint  Patient presents with   Shoulder Pain   Seizures    Tammy [name] is a 63 y.o. female.  HPI   63 year old female with medical history significant for anxiety, depression, GERD, 1 prior episode of seizure in the setting of stress and sleep deprivation with a negative outpatient workup by neurologist in 2017 with no prior seizures since who presents to the emergency department with a seizure.  The patient states that she also has a history of rotator cuff injury.  She follows outpatient with Dr. Harden of orthopedics.  She states that earlier today she turned slightly and felt her shoulder dislocate.  No clear trauma or fall, no head trauma.  The patient then subsequently had a generalized tonic-clonic seizure and route to the ED in the car that lasted around 5 minutes, with positive tongue biting, postictal state.  The patient was transferred to a stretcher and emergently bedded where she was found to be vitally stable and postictal.  Home Medications Prior to Admission medications   Medication Sig Start Date End Date Taking? Authorizing Provider  levETIRAcetam  (KEPPRA ) 500 MG tablet Take 1 tablet (500 mg total) by mouth 2 (two) times daily. 11/08/23 02/06/24 Yes Jerrol Agent, MD  oxyCODONE -acetaminophen  (PERCOCET/ROXICET) 5-325 MG tablet Take 1 tablet by mouth every 6 (six) hours as needed for severe pain (pain score 7-10). 11/08/23  Yes Jerrol Agent, MD  apixaban  (ELIQUIS ) 5 MG TABS tablet Take 1 tablet (5 mg total) by mouth 2 (two) times daily. 08/17/23   Lavona Agent, MD  buPROPion  (WELLBUTRIN  XL) 150 MG 24 hr tablet Take 2 tablets (300 mg total) by mouth daily. 10/25/21   Samtani, Jai-Gurmukh, MD  busPIRone (BUSPAR) 15 MG tablet Take 15 mg by mouth 2 (two) times daily. 03/18/23   [provider]  cholecalciferol (VITAMIN D3)  25 MCG (1000 UNIT) tablet Take 1,000 Units by mouth daily.    [provider]  clonazePAM  (KLONOPIN ) 1 MG tablet Take 1 tablet (1 mg total) by mouth 2 (two) times daily. 10/25/21   Samtani, Jai-Gurmukh, MD  cyanocobalamin (VITAMIN B12) 1000 MCG tablet Take 1,000 mcg by mouth daily.    [provider]  diclofenac  Sodium (VOLTAREN ) 1 % GEL APPLY SPARINGLY TO AFFECTED AREA(S) ONCE DAILY EQUIVALENT TO DICLOFENAC     GEL 1% 11/10/16   [provider]  diphenoxylate -atropine  (LOMOTIL ) 2.5-0.025 MG tablet Take 1 tablet by mouth 4 (four) times daily as needed for diarrhea or loose stools. 05/05/22   Wendolyn Jenkins Jansky, MD  ferrous sulfate 325 (65 FE) MG tablet Take 325 mg by mouth daily.    [provider]  FLUoxetine  (PROZAC ) 40 MG capsule Take 40 mg by mouth at bedtime. 06/23/21   [provider]  gabapentin  (NEURONTIN ) 600 MG tablet Take 1.5 tablets (900 mg total) by mouth 3 (three) times daily. 06/02/22   Wendolyn Jenkins Jansky, MD  ketoconazole  (NIZORAL ) 2 % cream Apply 1 Application topically 2 (two) times daily. 03/22/23   Wendolyn Jenkins Jansky, MD  Melatonin 10 MG TABS Take 10 mg by mouth at bedtime as needed (sleep).    [provider]  metoprolol  succinate (TOPROL -XL) 25 MG 24 hr tablet TAKE 1 TABLET BY MOUTH EVERY NIGHT AT BEDTIME **TAKE IF HEART RATE IS GREATER THAN 90** 07/11/23   Lavona Agent, MD  OLANZapine  (ZYPREXA )  10 MG tablet Take 10 mg by mouth at bedtime. 04/04/19   [provider]  potassium chloride  SA (KLOR-CON  M20) 20 MEQ tablet TAKE 1 TABLET BY MOUTH 2 TIMES A DAY --PLEASE MAKE APPOINTMENT 10/26/23   Lavona Agent, MD  Semaglutide-Weight Management 0.25 MG/0.5ML SOAJ Inject 0.25 mg into the skin once a week.    [provider]  Sodium Sulfate-Mag Sulfate-KCl (SUTAB ) 1479-225-188 MG TABS Use as directed for colonoscopy. MANUFACTURER CODES!! BIN: [id number] PCN: CN GROUP: [id number] MEMBER ID: [id number];MLW AS SECONDARY INSURANCE ;NO PRIOR  AUTHORIZATION 10/26/23   Beather Delon Gibson, PA  triamcinolone  cream (KENALOG ) 0.1 % APPLY TO THE AFFECTED AREA(S) 2 TIMES A DAY 11/25/22   Wendolyn Jenkins Jansky, MD      Allergies    Nsaids, Latex, Sulfa antibiotics, and Tape    Review of Systems   Review of Systems  Unable to perform ROS: Acuity of condition    Physical Exam Updated Vital Signs BP 130/72   Pulse 77   Temp 98.1 F (36.7 C) (Oral)   Resp 13   Wt 110.7 kg   LMP 09/30/2012   SpO2 100%   BMI 39.38 kg/m  Physical Exam Vitals and nursing note reviewed.  Constitutional:      General: She is not in acute distress.    Appearance: She is well-developed.     Comments: GCS 14, ABC intact, on , appears postictal  HENT:     Head: Normocephalic and atraumatic.  Eyes:     Extraocular Movements: Extraocular movements intact.     Conjunctiva/sclera: Conjunctivae normal.     Pupils: Pupils are equal, round, and reactive to light.  Neck:     Comments: No midline tenderness to palpation of the cervical spine.  Range of motion intact Cardiovascular:     Rate and Rhythm: Normal rate and regular rhythm.  Pulmonary:     Effort: Pulmonary effort is normal. No respiratory distress.     Breath sounds: Normal breath sounds.  Chest:     Comments: Clavicles stable nontender to AP compression.  Chest wall stable and nontender to AP and lateral compression. Abdominal:     Palpations: Abdomen is soft.     Tenderness: There is no abdominal tenderness.     Comments: Pelvis stable to lateral compression  Musculoskeletal:     Cervical back: Neck supple.     Comments: No midline tenderness to palpation of the thoracic or lumbar spine.  Extremities atraumatic with intact range of motion with the exception of TTP of the right shoulder, obvious dislocation present, distally NVI, in a sling on arrival  Skin:    General: Skin is warm and dry.  Neurological:     Mental Status: She is alert.     Comments: Cranial nerves II through XII  grossly intact.  Moving all 4 extremities spontaneously.  Sensation grossly intact all 4 extremities     ED Results / Procedures / Treatments   Labs (all labs ordered are listed, but only abnormal results are displayed) Labs Reviewed  COMPREHENSIVE METABOLIC PANEL - Abnormal; Notable for the following components:      Result Value   CO2 15 (*)    Glucose, Bld 128 (*)    Calcium 8.8 (*)    AST 43 (*)    Anion gap 20 (*)    All other components within normal limits  CBC WITH DIFFERENTIAL/PLATELET - Abnormal; Notable for the following components:   WBC 12.1 (*)    Neutro  Abs 9.8 (*)    All other components within normal limits  CBG MONITORING, ED - Abnormal; Notable for the following components:   Glucose-Capillary 139 (*)    All other components within normal limits  RESP PANEL BY RT-PCR (RSV, FLU A&B, COVID)  RVPGX2  MAGNESIUM  RAPID URINE DRUG SCREEN, HOSP PERFORMED  CBG MONITORING, ED    EKG EKG Interpretation Date/Time:  Tuesday November 08 2023 18:15:05 EST Ventricular Rate:  94 PR Interval:  150 QRS Duration:  105 QT Interval:  365 QTC Calculation: 457 R Axis:   62  Text Interpretation: Sinus rhythm Confirmed by Jerrol Agent ([id number]) on 11/08/2023 6:15:43 PM  Radiology DG Shoulder Right Portable Result Date: 11/08/2023 CLINICAL DATA:  Postreduction right shoulder dislocation. EXAM: RIGHT SHOULDER - 1 VIEW COMPARISON:  11/08/2023 FINDINGS: Anatomic appearance of the right shoulder on single view postreduction. Displaced fracture fragments are demonstrated lateral to the humeral head and inferior to the glenoid. Old resection or resorption of the distal clavicle. IMPRESSION: Anatomic appearance of the right shoulder postreduction. Displaced humeral and glenoid fracture fragments. Electronically Signed   By: Elsie Gravely M.D.   On: 11/08/2023 21:12   DG Shoulder Right Result Date: 11/08/2023 CLINICAL DATA:  Seizure, fall, shoulder injury EXAM: RIGHT SHOULDER - 2+ VIEW  COMPARISON:  None Available. FINDINGS: There is anteroinferior glenohumeral dislocation. There is irregularity of the greater tuberosity in keeping with an age indeterminate greater tuberosity fracture. There is prior resection of the distal right clavicle. Scapula appears intact. Limited evaluation of the right hemithorax is unremarkable. IMPRESSION: 1. Anteroinferior glenohumeral dislocation. 2. Age indeterminate greater tuberosity fracture. Electronically Signed   By: Dorethia Molt M.D.   On: 11/08/2023 19:37   CT Head Wo Contrast Result Date: 11/08/2023 CLINICAL DATA:  Mental status change of unknown cause. Dislocated shoulder. Seizures. EXAM: CT HEAD WITHOUT CONTRAST TECHNIQUE: Contiguous axial images were obtained from the base of the skull through the vertex without intravenous contrast. RADIATION DOSE REDUCTION: This exam was performed according to the departmental dose-optimization program which includes automated exposure control, adjustment of the mA and/or kV according to patient size and/or use of iterative reconstruction technique. COMPARISON:  02/14/2022 FINDINGS: Brain: No evidence of acute infarction, hemorrhage, hydrocephalus, extra-axial collection or mass lesion/mass effect. Vascular: No hyperdense vessel or unexpected calcification. Skull: Normal. Negative for fracture or focal lesion. Sinuses/Orbits: No acute finding. Other: None. IMPRESSION: No acute intracranial abnormalities. Electronically Signed   By: Elsie Gravely M.D.   On: 11/08/2023 19:23    Procedures .Reduction of dislocation  Date/Time: 11/08/2023 10:47 PM  Performed by: Jerrol Agent, MD Authorized by: Jerrol Agent, MD  Consent: Verbal consent obtained. Written consent obtained. Consent given by: patient Patient identity confirmed: verbally with patient and arm band Time out: Immediately prior to procedure a time out was called to verify the correct patient, procedure, equipment, support staff and site/side  marked as required. Preparation: Patient was prepped and draped in the usual sterile fashion.  Sedation: Patient sedated: yes Sedation type: moderate (conscious) sedation Sedatives: propofol  Analgesia: fentanyl  Sedation start date/time: 11/08/2023 8:48 PM Sedation end date/time: 11/08/2023 9:05 PM Vitals: Vital signs were monitored during sedation.  Patient tolerance: patient tolerated the procedure well with no immediate complications   .Sedation  Date/Time: 11/08/2023 8:48 PM  Performed by: Jerrol Agent, MD Authorized by: Jerrol Agent, MD   Consent:    Consent obtained:  Verbal and written   Consent given by:  Patient   Risks discussed:  Allergic reaction,  prolonged hypoxia resulting in organ damage, dysrhythmia, prolonged sedation necessitating reversal, inadequate sedation, respiratory compromise necessitating ventilatory assistance and intubation, vomiting and nausea Universal protocol:    Immediately prior to procedure, a time out was called: yes     Patient identity confirmed:  Arm band and verbally with patient Indications:    Procedure performed:  Dislocation reduction   Procedure necessitating sedation performed by:  Physician performing sedation Pre-sedation assessment:    Time since last food or drink:  4 hrs   ASA classification: class 3 - patient with severe systemic disease     Mouth opening:  3 or more finger widths   Thyromental distance:  3 finger widths   Mallampati score:  I - soft palate, uvula, fauces, pillars visible   Neck mobility: normal     Pre-sedation assessments completed and reviewed: airway patency, cardiovascular function, hydration status, mental status, nausea/vomiting, pain level, respiratory function and temperature   A pre-sedation assessment was completed prior to the start of the procedure Immediate pre-procedure details:    Reassessment: Patient reassessed immediately prior to procedure     Verified: bag valve mask available, emergency  equipment available, intubation equipment available, IV patency confirmed, oxygen  available and reversal medications available   Procedure details (see MAR for exact dosages):    Preoxygenation:  Nasal cannula   Sedation:  Propofol    Intended level of sedation: deep   Analgesia:  Fentanyl    Intra-procedure monitoring:  Blood pressure monitoring, cardiac monitor, continuous capnometry, continuous pulse oximetry, frequent LOC assessments and frequent vital sign checks   Intra-procedure events: none     Total Provider sedation time (minutes):  10 Post-procedure details:   A post-sedation assessment was completed following the completion of the procedure.   Attendance: Constant attendance by certified staff until patient recovered     Recovery: Patient returned to pre-procedure baseline     Post-sedation assessments completed and reviewed: airway patency, cardiovascular function, hydration status, mental status, nausea/vomiting, pain level, respiratory function and temperature     Patient is stable for discharge or admission: yes     Procedure completion:  Tolerated well, no immediate complications     Medications Ordered in ED Medications  propofol  (DIPRIVAN ) 10 mg/mL bolus/IV push 113.9 mg (has no administration in time range)  levETIRAcetam  (KEPPRA ) IVPB 500 mg/100 mL premix (0 mg Intravenous Stopped 11/08/23 1957)  fentaNYL  (SUBLIMAZE ) injection 50 mcg (50 mcg Intravenous Given 11/08/23 1901)  ondansetron  (ZOFRAN ) injection 4 mg (4 mg Intravenous Given 11/08/23 2004)  sodium chloride  0.9 % bolus 1,000 mL (0 mLs Intravenous Stopped 11/08/23 2256)  fentaNYL  (SUBLIMAZE ) injection 50 mcg (50 mcg Intravenous Given 11/08/23 2047)  propofol  (DIPRIVAN ) 10 mg/mL bolus/IV push (50 mcg Intravenous Given 11/08/23 2053)  oxyCODONE -acetaminophen  (PERCOCET/ROXICET) 5-325 MG per tablet 1 tablet (1 tablet Oral Given 11/08/23 2250)    ED Course/ Medical Decision Making/ A&P                                 Medical  Decision Making Amount and/or Complexity of Data Reviewed Labs: ordered. Radiology: ordered.  Risk Prescription drug management.      63 year old female with medical history significant for anxiety, depression, GERD, 1 prior episode of seizure in the setting of stress and sleep deprivation with a negative outpatient workup by neurologist in 2017 with no prior seizures since who presents to the emergency department with a seizure.  The patient states that  she also has a history of rotator cuff injury.  She follows outpatient with Dr. Harden of orthopedics.  She states that earlier today she turned slightly and felt her shoulder dislocate.  No clear trauma or fall, no head trauma.  The patient then subsequently had a generalized tonic-clonic seizure and route to the ED in the car that lasted around 5 minutes, with positive tongue biting, postictal state.  The patient was transferred to a stretcher and emergently bedded where she was found to be vitally stable and postictal.  On arrival, the patient was afebrile, initially tachycardic heart rate 104, improved to heart rate of 80 without intervention, RR 27, improved to 17 without intervention, BP 150/82, saturating 97% on nasal cannula.  Patient placed on end-tidal CO2 monitoring.  Patient is on outpatient Klonopin , uncertain if she missed any doses.  She thinks she may have.  No alcohol use.  Possible Klonopin  withdrawal as the trigger.  No clear head trauma.  Workup initiated to include CT head which was negative for acute traumatic injury, x-ray of the right shoulder which revealed an anterior-inferior shoulder dislocation. IMPRESSION:  1. Anteroinferior glenohumeral dislocation.  2. Age indeterminate greater tuberosity fracture.    The patient was monitored in the emergency department with no further seizure activity.  The patient was loaded with Keppra   She had returned to her baseline.  She consented for procedural sedation for reduction of her  shoulder dislocation.  Her laboratory evaluation revealed an anion gap acidosis, likely due to lactic acidosis in the setting of seizure.  CBG was notably 135.  She is afebrile, no meningismus.  She is back to her baseline.  She was consented and subsequently underwent procedural sedation for reduction as per the procedure note above.  Post reduction x-rays revealed satisfactory reduction of her humerus.  Did reveal evidence of fracture of the glenoid and humeral head.  Pain medicine was provided and the patient was maintained in a sling.  She was continued to be observed in the emergency department and was notably back to her baseline post reduction.  I did speak with on-call orthopedics, Dr. Vernetta who recommended outpatient follow-up with Dr. Genelle.  Patient to be maintained in sling. Pain medication prescribed. Pt also will need follow-up with neurology. Keppra  prescribed. Driving precautions also provided. All questions answered, stable for discharge.   Final Clinical Impression(s) / ED Diagnoses Final diagnoses:  Seizure (HCC)  Dislocation of right shoulder joint, initial encounter  Closed displaced fracture of greater tuberosity of right humerus, initial encounter  Closed fracture of glenoid cavity and neck of right scapula, initial encounter    Rx / DC Orders ED Discharge Orders          Ordered    Ambulatory referral to Neurology       Comments: An appointment is requested in approximately: 4 weeks   11/08/23 2210    oxyCODONE -acetaminophen  (PERCOCET/ROXICET) 5-325 MG tablet  Every 6 hours PRN        11/08/23 2244    levETIRAcetam  (KEPPRA ) 500 MG tablet  2 times daily        11/08/23 2244              Jerrol Agent, MD 11/08/23 2259

## 2023-11-09 ENCOUNTER — Other Ambulatory Visit: Payer: Self-pay | Admitting: *Deleted

## 2023-11-09 DIAGNOSIS — I480 Paroxysmal atrial fibrillation: Secondary | ICD-10-CM

## 2023-11-09 MED ORDER — APIXABAN 5 MG PO TABS: 5.0000 mg | ORAL_TABLET | Freq: Two times a day (BID) | ORAL | 5 refills | Status: DC

## 2023-11-09 NOTE — Telephone Encounter (Signed)
 Eliquis  5mg  refill request received. Patient is 63 years old, weight-110.7kg, Crea-0.96 on 11/08/23, Diagnosis-Afib, and last seen by Jackee Alberts on 05/24/22 and pending appt with Lamarr Satterfield on 11/17/23. Dose is appropriate based on dosing criteria. Will send in refill to requested pharmacy.

## 2023-11-10 ENCOUNTER — Encounter (HOSPITAL_BASED_OUTPATIENT_CLINIC_OR_DEPARTMENT_OTHER): Payer: Self-pay | Admitting: Student

## 2023-11-10 ENCOUNTER — Other Ambulatory Visit (HOSPITAL_BASED_OUTPATIENT_CLINIC_OR_DEPARTMENT_OTHER): Payer: Self-pay

## 2023-11-10 ENCOUNTER — Ambulatory Visit (HOSPITAL_BASED_OUTPATIENT_CLINIC_OR_DEPARTMENT_OTHER): Payer: BLUE CROSS/BLUE SHIELD | Admitting: Student

## 2023-11-10 DIAGNOSIS — S42251A Displaced fracture of greater tuberosity of right humerus, initial encounter for closed fracture: Secondary | ICD-10-CM | POA: Diagnosis not present

## 2023-11-10 DIAGNOSIS — S43004A Unspecified dislocation of right shoulder joint, initial encounter: Secondary | ICD-10-CM

## 2023-11-10 MED ORDER — OXYCODONE-ACETAMINOPHEN 5-325 MG PO TABS
1.0000 | ORAL_TABLET | Freq: Four times a day (QID) | ORAL | 0 refills | Status: DC | PRN
  Filled 2023-11-10: qty 20, 5d supply, fill #0

## 2023-11-10 NOTE — Progress Notes (Signed)
 Chief Complaint: Right shoulder injury     Discussed the use of AI scribe software for clinical note transcription with the patient, who gave verbal consent to proceed.  History of Present Illness   Tammy [name] is a 63 year old female who presents with right shoulder pain and recent seizure. She is accompanied by Marolyn, who brought her to the ER.  She presents with right shoulder pain that began after she heard and felt a pop while pushing herself up off the couch on Tuesday. She has a history of tears in both rotator cuffs, but they were not severe enough to require surgery. No recent falls or lifting heavy objects. The shoulder pain is severe, rated at 9 to 10 out of 10, and she is unable to lift her arm. She has been using a sling to immobilize the shoulder and is taking 5 mg of oxycodone  every six hours for pain management.  She reports numbness and tingling in all five fingers of her right hand, describing it as a numb feeling rather than pins and needles. Despite the numbness, she has good strength and motion in her hand.  She experienced a seizure upon arrival at the emergency department, which was her second seizure, the first occurring approximately seven to eight years ago.  She was not on any preventative medication for seizures until recently, when she was started on medication on Tuesday.      Surgical History:   None  PMH/PSH/Family History/Social History/Meds/Allergies:    Past Medical History:  Diagnosis Date   Anemia    Anxiety    Arthritis    neck and lower back - deg disc disease, fingers   Atrial fibrillation and flutter (HCC)    COVID-19 2020   Depression    Frequency of urination    GERD (gastroesophageal reflux disease)    History of seizure per pt no seizure since    11-07-2015  in setting of stress and sleep deprivation  /  negative work-up by neurologist unknown idiology   Injury of thumb, left    03-31-2016      Pelvic relaxation    Pneumonia    SUI (stress urinary incontinence, female)    Past Surgical History:  Procedure Laterality Date   ANTERIOR AND POSTERIOR REPAIR  09/25/2012   Procedure: ANTERIOR (CYSTOCELE) AND POSTERIOR REPAIR (RECTOCELE);  Surgeon: Olam Mill, MD;  Location: [facility];  Service: Gynecology;  Laterality: N/A;   ANTERIOR AND POSTERIOR REPAIR WITH SACROSPINOUS FIXATION N/A 04/12/2016   Procedure: ANTERIOR AND POSTERIOR REPAIR WITH SACROSPINOUS LIGAMENT SUSPENSION, CYSTOSCOPY;  Surgeon: Norleen Skill, MD;  Location: [facility];  Service: Gynecology;  Laterality: N/A;   APPENDECTOMY  1987   BREAST EXCISIONAL BIOPSY Left    No scar visable    BREAST REDUCTION SURGERY  2000  aprrox   ENDOMETRIAL ABLATION W/ NOVASURE  2011   in office   EXCISION LEFT BREAST BX  12-07-1999   benign   LAPAROSCOPIC CHOLECYSTECTOMY  02-09-2005   LAPAROSCOPIC VAGINAL HYSTERECTOMY WITH SALPINGO OOPHORECTOMY Bilateral 04/12/2016   Procedure: LAPAROSCOPIC ASSISTED VAGINAL HYSTERECTOMY WITH SALPINGO OOPHORECTOMY;  Surgeon: Norleen Skill, MD;  Location: [facility];  Service: Gynecology;  Laterality: Bilateral;   ORIF ANKLE FRACTURE Left 05/21/2022   Procedure: OPEN REDUCTION INTERNAL FIXATION (ORIF) LEFT DISTAL FIBULA FRACTURE;  Surgeon: Harden Jerona GAILS, MD;  Location: [facility];  Service: Orthopedics;  Laterality: Left;   ORIF RADIAL FRACTURE Left 02/09/2021   Procedure: ORIF LEFT DISTAL RADIUS FRACTURE;  Surgeon: Elisabeth Craig RAMAN, MD;  Location: [facility];  Service: Plastics;  Laterality: Left;  90 min   REDUCTION MAMMAPLASTY Bilateral    ROUX-EN-Y GASTRIC BYPASS  06/  2002   TUBAL LIGATION  10/ 1990   Social History   Socioeconomic History   Marital status: Married    Spouse name: Dalayza Zambrana   Number of children: 3   Years of education: 16   Highest education level: Not on file  Occupational History   Occupation: [employer]  Tobacco Use   Smoking status: Never    Smokeless tobacco: Never  Vaping Use   Vaping status: Never Used  Substance and Sexual Activity   Alcohol use: No    Comment: drinks daily - none since 08/2012 per pt.   Drug use: No   Sexual activity: Yes    Birth control/protection: Surgical  Other Topics Concern   Not on file  Social History Narrative   Lives with spouse   Caffeine  use: 2-3 cups per day   [employer] business analyst   Social Drivers of Health   Financial Resource Strain: Not on file  Food Insecurity: Not on file  Transportation Needs: Not on file  Physical Activity: Not on file  Stress: Not on file  Social Connections: Unknown (02/12/2022)   Received from [employer], [facility]   Social Network    Social Network: Not on file   Family History  Problem Relation Age of Onset   Hypertension Mother    Hearing loss Mother    Arthritis Mother    Colon polyps Mother    Hearing loss Father    Arthritis Father    Heart disease Father 54       CABG   Depression Sister    Arthritis Sister    Depression Daughter    Arthritis Daughter    Alcohol abuse Daughter    Breast cancer Maternal Aunt    Diabetes Maternal Aunt        Type I   Stomach cancer Maternal Uncle    Colon cancer Maternal Uncle        x3   Diabetes Maternal Uncle        x5 - Type II   Stomach cancer Maternal Grandmother    Diabetes Maternal Grandmother    Heart disease Maternal Grandmother    Heart disease Paternal Grandmother    Esophageal cancer Neg Hx    Gallbladder disease Neg Hx    Seizures Neg Hx    Allergies  Allergen Reactions   Nsaids Other (See Comments)    Post Gastric Bypass   Latex Itching, Rash and Other (See Comments)   Sulfa Antibiotics Itching and Rash   Tape Itching and Rash   Current Outpatient Medications  Medication Sig Dispense Refill   apixaban  (ELIQUIS ) 5 MG TABS tablet Take 1 tablet (5 mg total) by mouth 2 (two) times daily. 60 tablet 5   buPROPion  (WELLBUTRIN  XL) 150 MG 24 hr tablet Take 2  tablets (300 mg total) by mouth daily.     busPIRone (BUSPAR) 15 MG tablet Take 15 mg by mouth 2 (two) times daily.     cholecalciferol (VITAMIN D3) 25 MCG (1000 UNIT) tablet Take 1,000 Units by mouth daily.     clonazePAM  (KLONOPIN ) 1 MG tablet Take 1  tablet (1 mg total) by mouth 2 (two) times daily. 30 tablet 0   cyanocobalamin (VITAMIN B12) 1000 MCG tablet Take 1,000 mcg by mouth daily.     diclofenac  Sodium (VOLTAREN ) 1 % GEL APPLY SPARINGLY TO AFFECTED AREA(S) ONCE DAILY EQUIVALENT TO DICLOFENAC     GEL 1%     diphenoxylate -atropine  (LOMOTIL ) 2.5-0.025 MG tablet Take 1 tablet by mouth 4 (four) times daily as needed for diarrhea or loose stools. 10 tablet 0   ferrous sulfate 325 (65 FE) MG tablet Take 325 mg by mouth daily.     FLUoxetine  (PROZAC ) 40 MG capsule Take 40 mg by mouth at bedtime.     gabapentin  (NEURONTIN ) 600 MG tablet Take 1.5 tablets (900 mg total) by mouth 3 (three) times daily. 540 tablet 1   ketoconazole  (NIZORAL ) 2 % cream Apply 1 Application topically 2 (two) times daily. 60 g 0   levETIRAcetam  (KEPPRA ) 500 MG tablet Take 1 tablet (500 mg total) by mouth 2 (two) times daily. 60 tablet 2   Melatonin 10 MG TABS Take 10 mg by mouth at bedtime as needed (sleep).     metoprolol  succinate (TOPROL -XL) 25 MG 24 hr tablet TAKE 1 TABLET BY MOUTH EVERY NIGHT AT BEDTIME **TAKE IF HEART RATE IS GREATER THAN 90** 15 tablet 0   OLANZapine  (ZYPREXA ) 10 MG tablet Take 10 mg by mouth at bedtime.     oxyCODONE -acetaminophen  (PERCOCET/ROXICET) 5-325 MG tablet Take 1 tablet by mouth every 6 (six) hours as needed for up to 5 days for severe pain (pain score 7-10). 20 tablet 0   potassium chloride  SA (KLOR-CON  M20) 20 MEQ tablet TAKE 1 TABLET BY MOUTH 2 TIMES A DAY ::::: PLEASE MAKE APPOINTMENT 90 tablet 1   Semaglutide-Weight Management 0.25 MG/0.5ML SOAJ Inject 0.25 mg into the skin once a week.     Sodium Sulfate-Mag Sulfate-KCl (SUTAB ) 1479-225-188 MG TABS Use as directed for colonoscopy.  MANUFACTURER CODES!! BIN: [id number] PCN: CN GROUP: [id number] MEMBER ID: [id number];MLW AS SECONDARY INSURANCE ;NO PRIOR AUTHORIZATION 24 tablet 0   triamcinolone  cream (KENALOG ) 0.1 % APPLY TO THE AFFECTED AREA(S) 2 TIMES A DAY 30 g 0   No current facility-administered medications for this visit.   DG Shoulder Right Portable Result Date: 11/08/2023 CLINICAL DATA:  Postreduction right shoulder dislocation. EXAM: RIGHT SHOULDER - 1 VIEW COMPARISON:  11/08/2023 FINDINGS: Anatomic appearance of the right shoulder on single view postreduction. Displaced fracture fragments are demonstrated lateral to the humeral head and inferior to the glenoid. Old resection or resorption of the distal clavicle. IMPRESSION: Anatomic appearance of the right shoulder postreduction. Displaced humeral and glenoid fracture fragments. Electronically Signed   By: Elsie Gravely M.D.   On: 11/08/2023 21:12   DG Shoulder Right Result Date: 11/08/2023 CLINICAL DATA:  Seizure, fall, shoulder injury EXAM: RIGHT SHOULDER - 2+ VIEW COMPARISON:  None Available. FINDINGS: There is anteroinferior glenohumeral dislocation. There is irregularity of the greater tuberosity in keeping with an age indeterminate greater tuberosity fracture. There is prior resection of the distal right clavicle. Scapula appears intact. Limited evaluation of the right hemithorax is unremarkable. IMPRESSION: 1. Anteroinferior glenohumeral dislocation. 2. Age indeterminate greater tuberosity fracture. Electronically Signed   By: Dorethia Molt M.D.   On: 11/08/2023 19:37   CT Head Wo Contrast Result Date: 11/08/2023 CLINICAL DATA:  Mental status change of unknown cause. Dislocated shoulder. Seizures. EXAM: CT HEAD WITHOUT CONTRAST TECHNIQUE: Contiguous axial images were obtained from the base of the skull through the vertex without intravenous  contrast. RADIATION DOSE REDUCTION: This exam was performed according to the departmental dose-optimization program which includes  automated exposure control, adjustment of the mA and/or kV according to patient size and/or use of iterative reconstruction technique. COMPARISON:  02/14/2022 FINDINGS: Brain: No evidence of acute infarction, hemorrhage, hydrocephalus, extra-axial collection or mass lesion/mass effect. Vascular: No hyperdense vessel or unexpected calcification. Skull: Normal. Negative for fracture or focal lesion. Sinuses/Orbits: No acute finding. Other: None. IMPRESSION: No acute intracranial abnormalities. Electronically Signed   By: Elsie Gravely M.D.   On: 11/08/2023 19:23    Review of Systems:   A ROS was performed including pertinent positives and negatives as documented in the HPI.  Physical Exam :   Constitutional: NAD and appears stated age Neurological: Alert and oriented Psych: Appropriate affect and cooperative Last menstrual period 09/30/2012.   Comprehensive Musculoskeletal Exam:    Physical Exam   MUSCULOSKELETAL: Right shoulder exhibited good motion and strength, able to make a fist and perform hand movements without difficulty. NEUROLOGICAL: Sensation to light touch intact in all examined areas of the hand. Right hand demonstrates good capillary refill, strength in finger abduction and adduction, finger flexion, and opposition with no difficulty. Patient able to perform 'OK' sign without difficulty. Sensation of numbness in all fingers, described as the whole hand feeling asleep. Good radial pulse found.      Imaging:   Xray from 11/08/2023 (AP right shoulder): Successful shoulder reduction.  Mildly displaced greater tuberosity fracture with bony fragment noted inferior to the glenoid suggesting bony Bankart lesion.  Prior distal clavicle resection.   I personally reviewed and interpreted the radiographs.   Assessment:       Shoulder Dislocation   Acute right shoulder dislocation with a greater tuberosity fracture and bony Bankart lesion. There is no prior history of dislocation. Pain  is severe at 9/10, with noted paresthesia in the hand, though strength and pulse remain intact. Continue sling immobilization. Order an MRI to assess soft tissue injury and further characterize bony injuries. Manage pain with Oxycodone  5mg  every 6 hours as needed. Consider surgical intervention based on MRI findings and discussion with Dr. Genelle.      Plan :    -Obtain stat MRI of the right shoulder return to clinic for review and treatment discussion     I personally saw and evaluated the patient, and participated in the management and treatment plan.  Leonce Reveal, PA-C Orthopedics

## 2023-11-14 ENCOUNTER — Other Ambulatory Visit (HOSPITAL_BASED_OUTPATIENT_CLINIC_OR_DEPARTMENT_OTHER): Payer: Self-pay | Admitting: Student

## 2023-11-14 ENCOUNTER — Other Ambulatory Visit (HOSPITAL_BASED_OUTPATIENT_CLINIC_OR_DEPARTMENT_OTHER): Payer: Self-pay

## 2023-11-15 ENCOUNTER — Ambulatory Visit: Payer: BLUE CROSS/BLUE SHIELD | Admitting: Family Medicine

## 2023-11-15 ENCOUNTER — Other Ambulatory Visit: Payer: Self-pay | Admitting: *Deleted

## 2023-11-15 ENCOUNTER — Other Ambulatory Visit (HOSPITAL_BASED_OUTPATIENT_CLINIC_OR_DEPARTMENT_OTHER): Payer: Self-pay | Admitting: Student

## 2023-11-15 ENCOUNTER — Encounter: Payer: Self-pay | Admitting: Family Medicine

## 2023-11-15 ENCOUNTER — Telehealth: Payer: Self-pay | Admitting: Student

## 2023-11-15 ENCOUNTER — Other Ambulatory Visit (HOSPITAL_BASED_OUTPATIENT_CLINIC_OR_DEPARTMENT_OTHER): Payer: Self-pay

## 2023-11-15 VITALS — BP 105/70 | HR 84 | Temp 97.5°F | Resp 18 | Ht 66.0 in | Wt 247.0 lb

## 2023-11-15 DIAGNOSIS — R569 Unspecified convulsions: Secondary | ICD-10-CM

## 2023-11-15 DIAGNOSIS — S43004D Unspecified dislocation of right shoulder joint, subsequent encounter: Secondary | ICD-10-CM

## 2023-11-15 DIAGNOSIS — G40909 Epilepsy, unspecified, not intractable, without status epilepticus: Secondary | ICD-10-CM | POA: Diagnosis not present

## 2023-11-15 MED ORDER — OXYCODONE-ACETAMINOPHEN 5-325 MG PO TABS: 1.0000 | ORAL_TABLET | Freq: Four times a day (QID) | ORAL | 0 refills | Status: AC | PRN

## 2023-11-15 NOTE — Telephone Encounter (Signed)
MRI follow up scheduled for Monday 11/21/2023 at 3:00pm

## 2023-11-15 NOTE — Patient Instructions (Signed)
It was very nice to see you today!  Call ortho for pain meds   PLEASE NOTE:  If you had any lab tests please let us know if you have not heard back within a few days. You may see your results on MyChart before we have a chance to review them but we will give you a call once they are reviewed by [place]. If we ordered any referrals today, please let us know if you have not heard from their office within the next week.   Please try these tips to maintain a healthy lifestyle:  Eat most of your calories during the day when you are active. Eliminate processed foods including packaged sweets (pies, cakes, cookies), reduce intake of potatoes, white bread, white pasta, and white rice. Look for whole grain options, oat flour or almond flour.  Each meal should contain half fruits/vegetables, one quarter protein, and one quarter carbs (no bigger than a computer mouse).  Cut down on sweet beverages. This includes juice, soda, and sweet tea. Also watch fruit intake, though this is a healthier sweet option, it still contains natural sugar! Limit to 3 servings daily.  Drink at least 1 glass of water with each meal and aim for at least 8 glasses per day  Exercise at least 150 minutes every week.

## 2023-11-15 NOTE — Progress Notes (Unsigned)
Cardiology Office Note:   Date:  11/17/2023  ID:  Tammy [name], DOB Mar 21, 1961, MRN [id number] PCP: Jeani Sow, MD  Tammy [name] Cardiologist:  Rollene Rotunda, MD {  History of Present Illness:   Tammy [name] is a 63 y.o. female with a history of atrial fibrillation.  Her atrial fibrillation initially followed an episode of community-acquired pneumonia.  She was found to have atrial flutter on outpatient monitoring.  She has been diagnosed and treated for obstructive sleep apnea.  She is been managed with anticoagulation.   On last office visit she reported that she occasionally feels palpitations.  These are very brief.  She is not having any pain.  Having any chest pressure, neck or arm discomfort.  She has no shortness of breath, PND or orthopnea.  She has not been as active as I would like.  She has been tolerating her anticoagulation.  Today she is here with a sling on her right arm.  She dislocated her right shoulder while getting up out of a couch.  Was seen in the ED and had this reduced.  She is due to see her orthopedic physician Dr. Humphrey Rolls on February 17 and is having an MRI tomorrow to evaluate need for surgery.  She has a lot of bruising in her right shoulder and right breast.  She continues to have a good bit of soreness and pain.  She denies any rapid heart rate or palpitations.  She is medically compliant.    ROS: As above otherwise negative  Studies Reviewed:    EKG:   EKG Interpretation Date/Time:  Thursday November 17 2023 14:09:35 EST Ventricular Rate:  86 PR Interval:  142 QRS Duration:  86 QT Interval:  368 QTC Calculation: 440 R Axis:   12  Text Interpretation: Normal sinus rhythm Normal ECG When compared with ECG of 08-Nov-2023 18:15, PREVIOUS ECG IS PRESENT Confirmed by Tammy [name] [phone]) on 11/17/2023 2:21:57 PM    Risk Assessment/Calculations:    CHA2DS2-VASc Score = 2  This indicates a 2.2% annual risk of stroke.  CHF  History: 0 HTN History: 1 Diabetes History: 0 Stroke History: 0 Vascular Disease History: 0 Age Score: 0 Gender Score: 1    Physical Exam:   VS:  BP 116/78 (BP Location: Left Arm, Patient Position: Sitting, Cuff Size: Large)   Pulse 89   Ht 5\' 6"  (1.676 m)   Wt 246 lb 9.6 oz (111.9 kg)   LMP 09/30/2012   SpO2 97%   BMI 39.80 kg/m    Wt Readings from Last 3 Encounters:  11/17/23 246 lb 9.6 oz (111.9 kg)  11/15/23 247 lb (112 kg)  11/08/23 244 lb (110.7 kg)     GEN: Well nourished, well developed in no acute distress NECK: No JVD; No carotid bruits CARDIAC: RRR, no murmurs, rubs, gallops RESPIRATORY:  Clear to auscultation without rales, wheezing or rhonchi  ABDOMEN: Soft, non-tender, non-distended EXTREMITIES:  No edema; Sling to right arm.  ASSESSMENT AND PLAN:   PAF/FLUTTER:    Ms. MEEGHAN SKIPPER has a CHA2DS2 - VASc score of 2.    She tolerates anticoagulation.  Her paroxysms are very infrequent and she is not having any sustained tachyarrhythmias.  No change in therapy. Remains in NSR, slightly tachycardic. She is given refills on metoprolol and apixaban.  SLEEP APNEA:     She is unable to use CPAP.  She has tried multiple masks.   HTN: Her blood pressure is well controlled  as transiently been elevated but it is well controlled today.  Of asked her to keep an eye on this at home.  She wilSling to rightl continue the meds as listed.    DISLOCATED RIGHT SHOULDER: She is to have MRI tomorrow and see orthopedic surgeon on 11/21/2023 to discuss need for surgery. She is cleared from cardiac standpoint as she is able to perform at least 6 METS. Surgeon will need to contact us for instructions about holding Eliquis pre-operatively.    Follow up 6 months.   Signed, Tammy Reining, NP   arm remains in normal sinus rhythm slight bleed tachycardic today.

## 2023-11-15 NOTE — Telephone Encounter (Signed)
Referral placed.

## 2023-11-15 NOTE — Telephone Encounter (Signed)
Copied from CRM [id number]. Topic: Referral - Request for Referral >> Nov 15, 2023  2:22 PM Sonny Dandy B wrote: Did the patient discuss referral with their provider in the last year? Yes (If No - schedule appointment) (If Yes - send message)  Appointment offered? No  Type of order/referral and detailed reason for visit: Neurologist   Preference of office, provider, location: Neuro surgery   If referral order, have you been seen by this specialty before? No (If Yes, this issue or another issue? When? Where?  Can we respond through MyChart? Yes  Patient stated that [facility] is in her network and has neurologist at that practice. Had to cancel [facility] due to not being in her network. Referral placed.

## 2023-11-15 NOTE — Telephone Encounter (Signed)
Patient called needing Rx refilled Oxycodone. [facility] uses [facility] [address]   [address].  The number to contact patient is [phone]

## 2023-11-15 NOTE — Telephone Encounter (Signed)
Will send refill of pain medicine to pharmacy.  This is last refill until next follow up visit.

## 2023-11-15 NOTE — Progress Notes (Signed)
Subjective:     Patient ID: Tammy [name], female    DOB: 02/22/1961, 63 y.o.   MRN: [id number]  Chief Complaint  Patient presents with   Hospitalization Follow-up    Hospital follow-up on 11/08/23 for seizures Having a lot of pain, MRI scheduled for Friday    HPI Discussed the use of AI scribe software for clinical note transcription with the patient, who gave verbal consent to proceed.  History of Present Illness   Tammy [name] is a 63 year old female who presents with a recent seizure and dislocated right shoulder. She was accompanied by her daughter during the emergency room visit.  She experienced a dislocated right shoulder on November 08, 2023, which was extremely painful, described as 'the most painful thing I've ever had.' The shoulder was visibly out of place, and there was chipping of the bone noted. She has a history of rotator cuff tear in both arms, with a known tear in the right arm. The shoulder was repositioned in the emergency room under propofol sedation, resulting in significant bruising on her breast and underarm, likely exacerbated by her use of Eliquis. The bruising has remained stable without worsening.  Seeing ortho.  MRI later this week  On the same day as the shoulder dislocation, she experienced a seizure while in the car outside the emergency room. Her last seizure was in 2017, and she has not been on anti-seizure medication since then. She was not on a drinking binge, did not use illegal drugs, and had not taken excessive pain medication. She had missed some doses of clonazepam, which she takes variably for mood stabilization. A CT scan was performed in the ER, showing no major issues, and an MRI is scheduled for November 18, 2023.  She is on Eliquis, which may have contributed to the bruising. No shortness of breath, chest pain, or trouble breathing.       Health Maintenance Due  Topic Date Due   Colonoscopy  Never done    Past Medical History:   Diagnosis Date   Anemia    Anxiety    Arthritis    neck and lower back - deg disc disease, fingers   Atrial fibrillation and flutter (HCC)    COVID-19 2020   Depression    Frequency of urination    GERD (gastroesophageal reflux disease)    History of seizure per pt no seizure since    11-07-2015  in setting of stress and sleep deprivation  /  negative work-up by neurologist unknown idiology   Injury of thumb, left    03-31-2016     Pelvic relaxation    Pneumonia    SUI (stress urinary incontinence, female)     Past Surgical History:  Procedure Laterality Date   ANTERIOR AND POSTERIOR REPAIR  09/25/2012   Procedure: ANTERIOR (CYSTOCELE) AND POSTERIOR REPAIR (RECTOCELE);  Surgeon: Antionette Char, MD;  Location: [facility];  Service: Gynecology;  Laterality: N/A;   ANTERIOR AND POSTERIOR REPAIR WITH SACROSPINOUS FIXATION N/A 04/12/2016   Procedure: ANTERIOR AND POSTERIOR REPAIR WITH SACROSPINOUS LIGAMENT SUSPENSION, CYSTOSCOPY;  Surgeon: Richardean Chimera, MD;  Location: [facility];  Service: Gynecology;  Laterality: N/A;   APPENDECTOMY  1987   BREAST EXCISIONAL BIOPSY Left    No scar visable    BREAST REDUCTION SURGERY  2000  aprrox   ENDOMETRIAL ABLATION W/ NOVASURE  2011   in office   EXCISION LEFT BREAST BX  12-07-1999   benign  LAPAROSCOPIC CHOLECYSTECTOMY  02-09-2005   LAPAROSCOPIC VAGINAL HYSTERECTOMY WITH SALPINGO OOPHORECTOMY Bilateral 04/12/2016   Procedure: LAPAROSCOPIC ASSISTED VAGINAL HYSTERECTOMY WITH SALPINGO OOPHORECTOMY;  Surgeon: Richardean Chimera, MD;  Location: [facility];  Service: Gynecology;  Laterality: Bilateral;   ORIF ANKLE FRACTURE Left 05/21/2022   Procedure: OPEN REDUCTION INTERNAL FIXATION (ORIF) LEFT DISTAL FIBULA FRACTURE;  Surgeon: Nadara Mustard, MD;  Location: [facility] OR;  Service: Orthopedics;  Laterality: Left;   ORIF RADIAL FRACTURE Left 02/09/2021   Procedure: ORIF LEFT DISTAL RADIUS FRACTURE;  Surgeon: Allena Napoleon, MD;   Location: [facility];  Service: Plastics;  Laterality: Left;  90 min   REDUCTION MAMMAPLASTY Bilateral    ROUX-EN-Y GASTRIC BYPASS  06/  2002   TUBAL LIGATION  10/ 1990     Current Outpatient Medications:    apixaban (ELIQUIS) 5 MG TABS tablet, Take 1 tablet (5 mg total) by mouth 2 (two) times daily., Disp: 60 tablet, Rfl: 5   buPROPion HCl ER, XL, 450 MG TB24, Take 1 tablet by mouth every morning., Disp: , Rfl:    busPIRone (BUSPAR) 15 MG tablet, Take 15 mg by mouth 2 (two) times daily., Disp: , Rfl:    cholecalciferol (VITAMIN D3) 25 MCG (1000 UNIT) tablet, Take 1,000 Units by mouth daily., Disp: , Rfl:    clonazePAM (KLONOPIN) 1 MG tablet, Take 1 tablet (1 mg total) by mouth 2 (two) times daily., Disp: 30 tablet, Rfl: 0   cyanocobalamin (VITAMIN B12) 1000 MCG tablet, Take 1,000 mcg by mouth daily., Disp: , Rfl:    diclofenac Sodium (VOLTAREN) 1 % GEL, APPLY SPARINGLY TO AFFECTED AREA(S) ONCE DAILY EQUIVALENT TO DICLOFENAC    GEL 1%, Disp: , Rfl:    diphenoxylate-atropine (LOMOTIL) 2.5-0.025 MG tablet, Take 1 tablet by mouth 4 (four) times daily as needed for diarrhea or loose stools., Disp: 10 tablet, Rfl: 0   ferrous sulfate 325 (65 FE) MG tablet, Take 325 mg by mouth daily., Disp: , Rfl:    FLUoxetine (PROZAC) 20 MG capsule, Take 60 mg by mouth every morning., Disp: , Rfl:    gabapentin (NEURONTIN) 600 MG tablet, Take 1.5 tablets (900 mg total) by mouth 3 (three) times daily., Disp: 540 tablet, Rfl: 1   ketoconazole (NIZORAL) 2 % cream, Apply 1 Application topically 2 (two) times daily., Disp: 60 g, Rfl: 0   levETIRAcetam (KEPPRA) 500 MG tablet, Take 1 tablet (500 mg total) by mouth 2 (two) times daily., Disp: 60 tablet, Rfl: 2   Melatonin 10 MG TABS, Take 10 mg by mouth at bedtime as needed (sleep)., Disp: , Rfl:    metoprolol succinate (TOPROL-XL) 25 MG 24 hr tablet, TAKE 1 TABLET BY MOUTH EVERY NIGHT AT BEDTIME **TAKE IF HEART RATE IS GREATER THAN 90**, Disp: 15 tablet, Rfl: 0   OLANZapine  (ZYPREXA) 10 MG tablet, Take 10 mg by mouth at bedtime., Disp: , Rfl:    potassium chloride SA (KLOR-CON M20) 20 MEQ tablet, TAKE 1 TABLET BY MOUTH 2 TIMES A DAY ::::: PLEASE MAKE APPOINTMENT, Disp: 90 tablet, Rfl: 1   Semaglutide-Weight Management 0.25 MG/0.5ML SOAJ, Inject 0.25 mg into the skin once a week., Disp: , Rfl:    Sodium Sulfate-Mag Sulfate-KCl (SUTAB) [phone] MG TABS, Use as directed for colonoscopy. MANUFACTURER CODES!! BIN: [id number] PCN: CN GROUP: [id number] MEMBER ID: [id number];VHQ AS SECONDARY INSURANCE ;NO PRIOR AUTHORIZATION, Disp: 24 tablet, Rfl: 0   triamcinolone cream (KENALOG) 0.1 %, APPLY TO THE AFFECTED AREA(S) 2 TIMES A DAY, Disp:  30 g, Rfl: 0   oxyCODONE-acetaminophen (PERCOCET/ROXICET) 5-325 MG tablet, Take 1 tablet by mouth every 6 (six) hours as needed for up to 5 days for severe pain (pain score 7-10)., Disp: 20 tablet, Rfl: 0  Allergies  Allergen Reactions   Nsaids Other (See Comments)    Post Gastric Bypass   Latex Itching, Rash and Other (See Comments)   Sulfa Antibiotics Itching and Rash   Tape Itching and Rash   ROS neg/noncontributory except as noted HPI/below      Objective:     BP 105/70   Pulse 84   Temp (!) 97.5 F (36.4 C) (Temporal)   Resp 18   Ht 5\' 6"  (1.676 m)   Wt 247 lb (112 kg)   LMP 09/30/2012   SpO2 94%   BMI 39.87 kg/m  Wt Readings from Last 3 Encounters:  11/15/23 247 lb (112 kg)  11/08/23 244 lb (110.7 kg)  10/26/23 251 lb (113.9 kg)    Physical Exam   Gen: WDWN NAD HEENT: NCAT, conjunctiva not injected, sclera nonicteric NECK:  supple, no thyromegaly, no nodes, no carotid bruits CARDIAC: RRR, S1S2+, no murmur. DP 2+B LUNGS: CTAB. No wheezes ABDOMEN:  BS+, soft, NTND, No HSM, no masses EXT:  no edema MSK: R arm in sling.  NEURO: A&O x3.  CN II-XII intact.  PSYCH: normal mood. Good eye contact  Skin-some mild bruising R breast/chest wall-greenish/yellow.  No new  Reviewed ER notes    Assessment & Plan:   Seizure disorder [facility])  Dislocation of right shoulder joint, subsequent encounter  Assessment and Plan    Right Shoulder Dislocation   Presented to the ER on November 08, 2023, with a severely displaced right shoulder and bone chipping, accompanied by extreme pain and significant bruising, likely exacerbated by Eliquis. The shoulder was reduced under propofol sedation, resulting in bruising on the breast and underarm. An MRI is pending to assess a potential rotator cuff tear and the need for reconstructive surgery. Await MRI results to determine the necessity of surgery. Continue follow-up with an orthopedic specialist and contact them for a pain medication refill.  Seizure   Experienced a seizure upon arrival at the ER on November 08, 2023. A previous seizure occurred in 2017, and no anti-seizure medication has been taken since then. The seizure may have been induced by extreme pain from the shoulder dislocation or clonazepam withdrawal. No further seizures have been reported since the ER visit. Currently on Keppra. Continue Keppra until the follow-up with a neurologist on November 16, 2023. An MRI is scheduled for November 18, 2023, for further evaluation.  Bruising   Significant bruising on the breast and underarm followed the shoulder reduction, likely exacerbated by Eliquis. The bruising is healing, showing green and yellow coloration. Continue Eliquis as the bruising is not worsening.  General Health Maintenance   Scheduled for routine follow-up and cardiology appointment. Follow-up with cardiology in two days and attend a routine follow-up appointment on December 14, 2023.        Return for as sch in March.  Angelena Sole, MD

## 2023-11-15 NOTE — Addendum Note (Signed)
Addended by: Jobe Gibbon on: 11/15/2023 04:24 PM   Modules accepted: Orders

## 2023-11-16 ENCOUNTER — Ambulatory Visit: Payer: Self-pay | Admitting: Neurology

## 2023-11-16 ENCOUNTER — Encounter: Payer: Self-pay | Admitting: Neurology

## 2023-11-16 ENCOUNTER — Telehealth: Payer: Self-pay | Admitting: *Deleted

## 2023-11-16 NOTE — Telephone Encounter (Signed)
Copied from CRM [id number]. Topic: General - Other >> Nov 16, 2023  9:36 AM Fredrich Romans wrote: Reason for CRM: patient was referred to neurosurgery for seizures,they called in today stating that they do not see patients with her diagnosis.  Patient notified and stated that she will find somewhere else that take her insurance and give the office a call back.

## 2023-11-17 ENCOUNTER — Ambulatory Visit: Payer: BLUE CROSS/BLUE SHIELD | Attending: Adult Health | Admitting: Adult Health

## 2023-11-17 ENCOUNTER — Other Ambulatory Visit: Payer: BLUE CROSS/BLUE SHIELD

## 2023-11-17 ENCOUNTER — Encounter: Payer: Self-pay | Admitting: Adult Health

## 2023-11-17 VITALS — BP 116/78 | HR 89 | Ht 66.0 in | Wt 246.6 lb

## 2023-11-17 DIAGNOSIS — S43004D Unspecified dislocation of right shoulder joint, subsequent encounter: Secondary | ICD-10-CM

## 2023-11-17 DIAGNOSIS — I10 Essential (primary) hypertension: Secondary | ICD-10-CM

## 2023-11-17 DIAGNOSIS — I4891 Unspecified atrial fibrillation: Secondary | ICD-10-CM | POA: Diagnosis not present

## 2023-11-17 DIAGNOSIS — G4733 Obstructive sleep apnea (adult) (pediatric): Secondary | ICD-10-CM | POA: Diagnosis not present

## 2023-11-17 MED ORDER — METOPROLOL SUCCINATE ER 25 MG PO TB24: ORAL_TABLET | ORAL | 11 refills | Status: AC

## 2023-11-17 MED ORDER — APIXABAN 5 MG PO TABS
5.0000 mg | ORAL_TABLET | Freq: Two times a day (BID) | ORAL | 5 refills | Status: AC
Start: 2023-11-17 — End: ?

## 2023-11-17 NOTE — Patient Instructions (Signed)
Medication Instructions:  No Changes *If you need a refill on your cardiac medications before your next appointment, please call your pharmacy*   Lab Work: No Labs If you have labs (blood work) drawn today and your tests are completely normal, you will receive your results only by: MyChart Message (if you have MyChart) OR A paper copy in the mail If you have any lab test that is abnormal or we need to change your treatment, we will call you to review the results.   Testing/Procedures: No Testing   Follow-Up: At [facility], you and your health needs are our priority.  As part of our continuing mission to provide you with exceptional heart care, we have created designated Provider Care Teams.  These Care Teams include your primary Cardiologist (physician) and Advanced Practice Providers (APPs -  Physician Assistants and Nurse Practitioners) who all work together to provide you with the care you need, when you need it.  We recommend signing up for the patient portal called "MyChart".  Sign up information is provided on this After Visit Summary.  MyChart is used to connect with patients for Virtual Visits (Telemedicine).  Patients are able to view lab/test results, encounter notes, upcoming appointments, etc.  Non-urgent messages can be sent to your provider as well.   To learn more about what you can do with MyChart, go to [email or web address].    Your next appointment:   6 month(s)  Provider:   Rollene Rotunda, MD

## 2023-11-21 ENCOUNTER — Ambulatory Visit (HOSPITAL_BASED_OUTPATIENT_CLINIC_OR_DEPARTMENT_OTHER): Payer: BLUE CROSS/BLUE SHIELD | Admitting: Student

## 2023-11-21 ENCOUNTER — Encounter (HOSPITAL_BASED_OUTPATIENT_CLINIC_OR_DEPARTMENT_OTHER): Payer: Self-pay | Admitting: Student

## 2023-11-21 DIAGNOSIS — S43004A Unspecified dislocation of right shoulder joint, initial encounter: Secondary | ICD-10-CM | POA: Diagnosis not present

## 2023-11-21 DIAGNOSIS — M75121 Complete rotator cuff tear or rupture of right shoulder, not specified as traumatic: Secondary | ICD-10-CM

## 2023-11-21 DIAGNOSIS — S42251A Displaced fracture of greater tuberosity of right humerus, initial encounter for closed fracture: Secondary | ICD-10-CM

## 2023-11-21 MED ORDER — OXYCODONE-ACETAMINOPHEN 5-325 MG PO TABS: 1.0000 | ORAL_TABLET | Freq: Four times a day (QID) | ORAL | 0 refills | Status: AC | PRN

## 2023-11-21 MED ORDER — OXYCODONE HCL 5 MG PO TABS
5.0000 mg | ORAL_TABLET | ORAL | 0 refills | Status: DC | PRN
Start: 2023-11-21 — End: 2024-05-17

## 2023-11-21 MED ORDER — ACETAMINOPHEN 500 MG PO TABS: 500.0000 mg | ORAL_TABLET | Freq: Four times a day (QID) | ORAL | 0 refills | Status: AC | PRN

## 2023-11-21 NOTE — Progress Notes (Unsigned)
Chief Complaint: Right shoulder injury    History of Present Illness:  Patient presents today for MRI review of the right shoulder.   Surgical History:   None  PMH/PSH/Family History/Social History/Meds/Allergies:    Past Medical History:  Diagnosis Date   Anemia    Anxiety    Arthritis    neck and lower back - deg disc disease, fingers   Atrial fibrillation and flutter (HCC)    COVID-19 2020   Depression    Frequency of urination    GERD (gastroesophageal reflux disease)    History of seizure per pt no seizure since    11-07-2015  in setting of stress and sleep deprivation  /  negative work-up by neurologist unknown idiology   Injury of thumb, left    03-31-2016     Pelvic relaxation    Pneumonia    SUI (stress urinary incontinence, female)    Past Surgical History:  Procedure Laterality Date   ANTERIOR AND POSTERIOR REPAIR  09/25/2012   Procedure: ANTERIOR (CYSTOCELE) AND POSTERIOR REPAIR (RECTOCELE);  Surgeon: Antionette Char, MD;  Location: [facility];  Service: Gynecology;  Laterality: N/A;   ANTERIOR AND POSTERIOR REPAIR WITH SACROSPINOUS FIXATION N/A 04/12/2016   Procedure: ANTERIOR AND POSTERIOR REPAIR WITH SACROSPINOUS LIGAMENT SUSPENSION, CYSTOSCOPY;  Surgeon: Richardean Chimera, MD;  Location: [facility];  Service: Gynecology;  Laterality: N/A;   APPENDECTOMY  1987   BREAST EXCISIONAL BIOPSY Left    No scar visable    BREAST REDUCTION SURGERY  2000  aprrox   ENDOMETRIAL ABLATION W/ NOVASURE  2011   in office   EXCISION LEFT BREAST BX  12-07-1999   benign   LAPAROSCOPIC CHOLECYSTECTOMY  02-09-2005   LAPAROSCOPIC VAGINAL HYSTERECTOMY WITH SALPINGO OOPHORECTOMY Bilateral 04/12/2016   Procedure: LAPAROSCOPIC ASSISTED VAGINAL HYSTERECTOMY WITH SALPINGO OOPHORECTOMY;  Surgeon: Richardean Chimera, MD;  Location: [facility];  Service: Gynecology;  Laterality: Bilateral;   ORIF ANKLE FRACTURE Left 05/21/2022    Procedure: OPEN REDUCTION INTERNAL FIXATION (ORIF) LEFT DISTAL FIBULA FRACTURE;  Surgeon: Nadara Mustard, MD;  Location: [facility] OR;  Service: Orthopedics;  Laterality: Left;   ORIF RADIAL FRACTURE Left 02/09/2021   Procedure: ORIF LEFT DISTAL RADIUS FRACTURE;  Surgeon: Allena Napoleon, MD;  Location: [facility];  Service: Plastics;  Laterality: Left;  90 min   REDUCTION MAMMAPLASTY Bilateral    ROUX-EN-Y GASTRIC BYPASS  06/  2002   TUBAL LIGATION  10/ 1990   Social History   Socioeconomic History   Marital status: Married    Spouse name: Terrah Decoster   Number of children: 3   Years of education: 16   Highest education level: Not on file  Occupational History   Occupation: [employer]  Tobacco Use   Smoking status: Never   Smokeless tobacco: Never  Vaping Use   Vaping status: Never Used  Substance and Sexual Activity   Alcohol use: No    Comment: drinks daily - none since 08/2012 per pt.   Drug use: No   Sexual activity: Yes    Birth control/protection: Surgical  Other Topics Concern   Not on file  Social History Narrative   Lives with spouse   Caffeine use: 2-3 cups per day   [employer] business analyst   Social Drivers of Health   Financial  Resource Strain: Not on file  Food Insecurity: Not on file  Transportation Needs: Not on file  Physical Activity: Not on file  Stress: Not on file  Social Connections: Unknown (02/12/2022)   Received from [facility]   Social Network    Social Network: Not on file   Family History  Problem Relation Age of Onset   Hypertension Mother    Hearing loss Mother    Arthritis Mother    Colon polyps Mother    Hearing loss Father    Arthritis Father    Heart disease Father 66       CABG   Depression Sister    Arthritis Sister    Depression Daughter    Arthritis Daughter    Alcohol abuse Daughter    Breast cancer Maternal Aunt    Diabetes Maternal Aunt        Type I   Stomach cancer Maternal Uncle    Colon cancer  Maternal Uncle        x3   Diabetes Maternal Uncle        x5 - Type II   Stomach cancer Maternal Grandmother    Diabetes Maternal Grandmother    Heart disease Maternal Grandmother    Heart disease Paternal Grandmother    Esophageal cancer Neg Hx    Gallbladder disease Neg Hx    Seizures Neg Hx    Allergies  Allergen Reactions   Nsaids Other (See Comments)    Post Gastric Bypass   Latex Itching, Rash and Other (See Comments)   Sulfa Antibiotics Itching and Rash   Tape Itching and Rash   Current Outpatient Medications  Medication Sig Dispense Refill   acetaminophen (TYLENOL) 500 MG tablet Take 1 tablet (500 mg total) by mouth every 6 (six) hours as needed for up to 14 days. 30 tablet 0   oxyCODONE (ROXICODONE) 5 MG immediate release tablet Take 1 tablet (5 mg total) by mouth every 4 (four) hours as needed for severe pain (pain score 7-10) or breakthrough pain. 30 tablet 0   oxyCODONE-acetaminophen (PERCOCET/ROXICET) 5-325 MG tablet Take 1 tablet by mouth every 6 (six) hours as needed for up to 5 days for severe pain (pain score 7-10). 20 tablet 0   apixaban (ELIQUIS) 5 MG TABS tablet Take 1 tablet (5 mg total) by mouth 2 (two) times daily. 60 tablet 5   buPROPion HCl ER, XL, 450 MG TB24 Take 1 tablet by mouth every morning.     busPIRone (BUSPAR) 15 MG tablet Take 15 mg by mouth 2 (two) times daily.     cholecalciferol (VITAMIN D3) 25 MCG (1000 UNIT) tablet Take 1,000 Units by mouth daily.     clonazePAM (KLONOPIN) 1 MG tablet Take 1 tablet (1 mg total) by mouth 2 (two) times daily. 30 tablet 0   cyanocobalamin (VITAMIN B12) 1000 MCG tablet Take 1,000 mcg by mouth daily.     diclofenac Sodium (VOLTAREN) 1 % GEL APPLY SPARINGLY TO AFFECTED AREA(S) ONCE DAILY EQUIVALENT TO DICLOFENAC    GEL 1%     ferrous sulfate 325 (65 FE) MG tablet Take 325 mg by mouth daily.     FLUoxetine (PROZAC) 20 MG capsule Take 60 mg by mouth every morning.     gabapentin (NEURONTIN) 600 MG tablet Take 1.5  tablets (900 mg total) by mouth 3 (three) times daily. 540 tablet 1   ketoconazole (NIZORAL) 2 % cream Apply 1 Application topically 2 (two) times daily. 60 g  0   levETIRAcetam (KEPPRA) 500 MG tablet Take 1 tablet (500 mg total) by mouth 2 (two) times daily. 60 tablet 2   Melatonin 10 MG TABS Take 10 mg by mouth at bedtime as needed (sleep).     metoprolol succinate (TOPROL-XL) 25 MG 24 hr tablet TAKE 1 TABLET BY MOUTH EVERY NIGHT AT BEDTIME **TAKE IF HEART RATE IS GREATER THAN 90** 30 tablet 11   OLANZapine (ZYPREXA) 10 MG tablet Take 10 mg by mouth at bedtime.     potassium chloride SA (KLOR-CON M20) 20 MEQ tablet TAKE 1 TABLET BY MOUTH 2 TIMES A DAY ::::: PLEASE MAKE APPOINTMENT 90 tablet 1   Semaglutide-Weight Management 0.25 MG/0.5ML SOAJ Inject 0.25 mg into the skin once a week.     Sodium Sulfate-Mag Sulfate-KCl (SUTAB) [phone] MG TABS Use as directed for colonoscopy. MANUFACTURER CODES!! BIN: [id number] PCN: CN GROUP: [id number] MEMBER ID: [id number];TKZ AS SECONDARY INSURANCE ;NO PRIOR AUTHORIZATION 24 tablet 0   triamcinolone cream (KENALOG) 0.1 % APPLY TO THE AFFECTED AREA(S) 2 TIMES A DAY 30 g 0   No current facility-administered medications for this visit.   No results found.   Review of Systems:   A ROS was performed including pertinent positives and negatives as documented in the HPI.  Physical Exam :   Constitutional: NAD and appears stated age Neurological: Alert and oriented Psych: Appropriate affect and cooperative Last menstrual period 09/30/2012.   Comprehensive Musculoskeletal Exam:    Patient has right arm immobilized in a sling.  Neurovascularly intact distally.  Imaging:   MRI report right shoulder: 1. Sequela of anterior shoulder dislocation with a large Hill-Sachs fracture in the posterolateral humeral head and bony Bankart fracture at the anterior inferior glenoid. 2. Comminuted fracture of the greater tuberosity of the humerus involving the rotator cuff  footprint. 3. Complete tears of the supraspinatus and infraspinatus tendons with retraction and severe fatty atrophy of the supraspinatus and infraspinatus muscles. 4. Full-thickness tear of the superior and mid fibers of the subscapularis tendon at the footprint. 5. Large decompressed glenohumeral joint effusion with synovitis and joint bodies in the axillary pouch.   Assessment:     Plan :    -Plan for right reverse total shoulder arthroplasty with Dr. Steward Drone -Postop meds sent to pharmacy -Obtain CT of the right shoulder for measurement of hardware components     I personally saw and evaluated the patient, and participated in the management and treatment plan.  Hazle Nordmann, PA-C Orthopedics

## 2023-11-22 ENCOUNTER — Telehealth: Payer: Self-pay | Admitting: Orthopedic Surgery

## 2023-11-22 NOTE — Telephone Encounter (Signed)
Ms. Cuttino is calling because she needs the order that Jean Rosenthal placed for CT scan of her shoulder to be sent to [facility] on [address] in [place].  Thank you!

## 2023-11-23 NOTE — Telephone Encounter (Signed)
Order changed to [facility] [employer]

## 2023-11-24 ENCOUNTER — Telehealth (HOSPITAL_BASED_OUTPATIENT_CLINIC_OR_DEPARTMENT_OTHER): Payer: Self-pay | Admitting: Student

## 2023-11-24 ENCOUNTER — Other Ambulatory Visit (HOSPITAL_BASED_OUTPATIENT_CLINIC_OR_DEPARTMENT_OTHER): Payer: Self-pay

## 2023-11-24 NOTE — Telephone Encounter (Signed)
Patient needs CT scan sent to [facility] [address] [place] [id number]

## 2023-11-24 NOTE — Telephone Encounter (Signed)
The order has already been sent to [facility] [place]. Someone from there will call pt to get scheduled.

## 2023-11-25 ENCOUNTER — Other Ambulatory Visit (HOSPITAL_COMMUNITY): Payer: Self-pay

## 2023-11-25 ENCOUNTER — Other Ambulatory Visit (HOSPITAL_BASED_OUTPATIENT_CLINIC_OR_DEPARTMENT_OTHER): Payer: Self-pay | Admitting: Orthopaedic Surgery

## 2023-11-25 ENCOUNTER — Telehealth (HOSPITAL_BASED_OUTPATIENT_CLINIC_OR_DEPARTMENT_OTHER): Payer: Self-pay | Admitting: Orthopaedic Surgery

## 2023-11-25 MED ORDER — OXYCODONE HCL 5 MG PO TABS: 5.0000 mg | ORAL_TABLET | ORAL | 0 refills | Status: DC | PRN

## 2023-11-25 NOTE — Telephone Encounter (Signed)
Patient needs refill on oxycodone 5325 sent to pharmacy

## 2023-11-28 ENCOUNTER — Telehealth (HOSPITAL_BASED_OUTPATIENT_CLINIC_OR_DEPARTMENT_OTHER): Payer: Self-pay | Admitting: Student

## 2023-11-28 ENCOUNTER — Telehealth: Payer: Self-pay | Admitting: Student

## 2023-11-28 ENCOUNTER — Other Ambulatory Visit (HOSPITAL_BASED_OUTPATIENT_CLINIC_OR_DEPARTMENT_OTHER): Payer: Self-pay | Admitting: Student

## 2023-11-28 MED ORDER — OXYCODONE HCL 5 MG PO TABS: 5.0000 mg | ORAL_TABLET | ORAL | 0 refills | Status: AC | PRN

## 2023-11-28 NOTE — Telephone Encounter (Signed)
 Patient's husband, Minerva Areola, says that everything has been fixed with the prescription and was very appreciative of all the help from [place] over the phone today. He will call back with any further questions.

## 2023-11-28 NOTE — Telephone Encounter (Signed)
 Patient would like  you to give her husband Minerva Areola a call  [phone]

## 2023-11-28 NOTE — Telephone Encounter (Signed)
 Patient called. Says the [facility] on [address] has the oxycodone. The other pharmacy does not have it.

## 2023-11-28 NOTE — Telephone Encounter (Signed)
 Patient needs meds sent to [facility] [id number]  pharm[facility]

## 2023-11-30 ENCOUNTER — Other Ambulatory Visit: Payer: BLUE CROSS/BLUE SHIELD

## 2023-12-02 ENCOUNTER — Telehealth (HOSPITAL_BASED_OUTPATIENT_CLINIC_OR_DEPARTMENT_OTHER): Payer: Self-pay | Admitting: Student

## 2023-12-02 NOTE — Telephone Encounter (Signed)
 Patient need a a refill on her med. Please send to [facility] on [address]

## 2023-12-09 ENCOUNTER — Encounter: Payer: BLUE CROSS/BLUE SHIELD | Admitting: Internal Medicine

## 2023-12-12 ENCOUNTER — Telehealth: Payer: Self-pay | Admitting: Adult Health

## 2023-12-12 NOTE — Telephone Encounter (Signed)
 Patient identification verified by 2 forms. Shade Flood, RN     Dickey Gave at [facility]. Lvm that EKG has been faxed via epic

## 2023-12-12 NOTE — Telephone Encounter (Signed)
 Inetta Fermo from [facility] preop team is calling to see if we can fax over patient's last EKG results. [phone] ATTN: Inetta Fermo

## 2023-12-14 ENCOUNTER — Encounter: Payer: BLUE CROSS/BLUE SHIELD | Admitting: Family Medicine

## 2023-12-16 NOTE — Addendum Note (Signed)
 Addended by: Angelena Sole on: 12/16/2023 03:34 PM   Modules accepted: Orders

## 2023-12-21 ENCOUNTER — Ambulatory Visit: Payer: BLUE CROSS/BLUE SHIELD | Admitting: Physician Assistant

## 2024-02-06 ENCOUNTER — Other Ambulatory Visit: Payer: Self-pay | Admitting: Cardiology

## 2024-05-17 ENCOUNTER — Ambulatory Visit: Payer: Self-pay

## 2024-05-17 ENCOUNTER — Telehealth: Admitting: Physician Assistant

## 2024-05-17 DIAGNOSIS — U071 COVID-19: Secondary | ICD-10-CM | POA: Diagnosis not present

## 2024-05-17 MED ORDER — BENZONATATE 100 MG PO CAPS: 100.0000 mg | ORAL_CAPSULE | Freq: Three times a day (TID) | ORAL | 0 refills | Status: DC | PRN

## 2024-05-17 MED ORDER — ALBUTEROL SULFATE HFA 108 (90 BASE) MCG/ACT IN AERS: 2.0000 | INHALATION_SPRAY | Freq: Four times a day (QID) | RESPIRATORY_TRACT | 0 refills | Status: AC | PRN

## 2024-05-17 MED ORDER — MOLNUPIRAVIR EUA 200MG CAPSULE: 4.0000 | ORAL_CAPSULE | Freq: Two times a day (BID) | ORAL | 0 refills | Status: AC

## 2024-05-17 NOTE — Progress Notes (Signed)
 Virtual Visit Consent   Tammy [name], you are scheduled for a virtual visit with a [facility] provider today. Just as with appointments in the office, your consent must be obtained to participate. Your consent will be active for this visit and any virtual visit you may have with one of our providers in the next 365 days. If you have a MyChart account, a copy of this consent can be sent to you electronically.  As this is a virtual visit, video technology does not allow for your provider to perform a traditional examination. This may limit your provider's ability to fully assess your condition. If your provider identifies any concerns that need to be evaluated in person or the need to arrange testing (such as labs, EKG, etc.), we will make arrangements to do so. Although advances in technology are sophisticated, we cannot ensure that it will always work on either your end or our end. If the connection with a video visit is poor, the visit may have to be switched to a telephone visit. With either a video or telephone visit, we are not always able to ensure that we have a secure connection.  By engaging in this virtual visit, you consent to the provision of healthcare and authorize for your insurance to be billed (if applicable) for the services provided during this visit. Depending on your insurance coverage, you may receive a charge related to this service.  I need to obtain your verbal consent now. Are you willing to proceed with your visit today? Tammy [name] has provided verbal consent on 05/17/2024 for a virtual visit (video or telephone). Tammy [name], [place]  Date: 05/17/2024 6:11 PM   Virtual Visit via Video Note   I, Tammy [name], connected with  Tammy [name]  ([id number], [birth year]) on 05/17/24 at  6:15 PM EDT by a video-enabled telemedicine application and verified that I am speaking with the correct person using two identifiers.  Location: Patient: Virtual Visit Location  Patient: Home Provider: Virtual Visit Location Provider: Home Office   I discussed the limitations of evaluation and management by telemedicine and the availability of in person appointments. The patient expressed understanding and agreed to proceed.    History of Present Illness: Tammy [name] is a 63 y.o. who identifies as a female who was assigned female at birth, and is being seen today for COVID-19.   Endorses symptoms onset Tuesday with nasal and head congestion, cough, and mild SOB with stairs only. Denies fever, chills, nausea, vomiting or diarrhea. Tested positive on two home tests today.  OTC -- Delsym, Dayquil, Tylenol .     HPI: HPI  Problems:  Patient Active Problem List   Diagnosis Date Noted   Essential hypertension 10/27/2023   Closed displaced fracture of lateral malleolus of left fibula    History of gastric bypass 12/02/2021   Degenerative disc disease, lumbar 11/05/2021   Hallucinations 10/24/2021   Weakness 10/24/2021   AF (paroxysmal atrial fibrillation) (HCC) 10/24/2021   OSA (obstructive sleep apnea) 10/24/2021   Encephalopathy 09/17/2021   Hyponatremia 09/17/2021   Anemia 09/17/2021   Iron deficiency anemia 09/17/2021   Closed nondisplaced fracture of neck of right radius 07/21/2020   Somnolence    Aspiration pneumonia (HCC) 02/20/2020   Lactic acidosis 02/20/2020   Acute metabolic encephalopathy 02/20/2020   Sepsis (HCC) 02/20/2020   Chronic pain of both knees 01/08/2020   Encephalopathy acute 10/27/2019   Acute encephalopathy 10/27/2019   CAP (community acquired pneumonia) 07/31/2019  Atrial fibrillation with RVR (HCC) 07/31/2019   Morbid obesity with BMI of 50.0-59.9, adult (HCC) 07/31/2019   Respiratory failure (HCC) 04/14/2019   Community acquired pneumonia 04/14/2019   Closed nondisplaced fracture of head of left radius 06/20/2018   Sprain of left wrist 06/20/2018   Peroneal tendinitis, right leg 06/23/2017   Trochanteric bursitis, right  hip 06/23/2017   Acute right-sided low back pain with left-sided sciatica 03/24/2017   Pain in right hip 03/24/2017   Impingement syndrome of left shoulder 08/30/2016   Impingement syndrome of right shoulder 08/30/2016   Plantar fasciitis, bilateral 08/30/2016   Pelvic relaxation 04/13/2016    Class: Present on Admission   S/P hysterectomy 04/12/2016   Depression 11/16/2015   Generalized anxiety disorder 11/16/2015   Convulsion (HCC) 11/16/2015   Gastroesophageal reflux disease without esophagitis 11/10/2015   Dysphagia, pharyngeal phase 11/10/2015   SUI (stress urinary incontinence, female) 09/26/2012    Allergies:  Allergies  Allergen Reactions   Nsaids Other (See Comments)    Post Gastric Bypass   Latex Itching, Rash and Other (See Comments)   Sulfa Antibiotics Itching and Rash   Tape Itching and Rash   Medications:  Current Outpatient Medications:    apixaban  (ELIQUIS ) 5 MG TABS tablet, Take 1 tablet (5 mg total) by mouth 2 (two) times daily., Disp: 60 tablet, Rfl: 5   buPROPion  HCl ER, XL, 450 MG TB24, Take 1 tablet by mouth every morning., Disp: , Rfl:    busPIRone (BUSPAR) 15 MG tablet, Take 15 mg by mouth 2 (two) times daily., Disp: , Rfl:    cholecalciferol (VITAMIN D3) 25 MCG (1000 UNIT) tablet, Take 1,000 Units by mouth daily., Disp: , Rfl:    clonazePAM  (KLONOPIN ) 1 MG tablet, Take 1 tablet (1 mg total) by mouth 2 (two) times daily., Disp: 30 tablet, Rfl: 0   cyanocobalamin (VITAMIN B12) 1000 MCG tablet, Take 1,000 mcg by mouth daily., Disp: , Rfl:    diclofenac  Sodium (VOLTAREN ) 1 % GEL, APPLY SPARINGLY TO AFFECTED AREA(S) ONCE DAILY EQUIVALENT TO DICLOFENAC     GEL 1%, Disp: , Rfl:    ferrous sulfate 325 (65 FE) MG tablet, Take 325 mg by mouth daily., Disp: , Rfl:    FLUoxetine  (PROZAC ) 20 MG capsule, Take 60 mg by mouth every morning., Disp: , Rfl:    gabapentin  (NEURONTIN ) 600 MG tablet, Take 1.5 tablets (900 mg total) by mouth 3 (three) times daily., Disp: 540  tablet, Rfl: 1   ketoconazole  (NIZORAL ) 2 % cream, Apply 1 Application topically 2 (two) times daily., Disp: 60 g, Rfl: 0   levETIRAcetam  (KEPPRA ) 500 MG tablet, Take 1 tablet (500 mg total) by mouth 2 (two) times daily., Disp: 60 tablet, Rfl: 2   Melatonin 10 MG TABS, Take 10 mg by mouth at bedtime as needed (sleep)., Disp: , Rfl:    metoprolol  succinate (TOPROL -XL) 25 MG 24 hr tablet, TAKE 1 TABLET BY MOUTH EVERY NIGHT AT BEDTIME **TAKE IF HEART RATE IS GREATER THAN 90**, Disp: 30 tablet, Rfl: 11   OLANZapine  (ZYPREXA ) 10 MG tablet, Take 10 mg by mouth at bedtime., Disp: , Rfl:    oxyCODONE  (ROXICODONE ) 5 MG immediate release tablet, Take 1 tablet (5 mg total) by mouth every 4 (four) hours as needed for severe pain (pain score 7-10) or breakthrough pain., Disp: 30 tablet, Rfl: 0   oxyCODONE  (ROXICODONE ) 5 MG immediate release tablet, Take 1 tablet (5 mg total) by mouth every 4 (four) hours as needed., Disp: 10 tablet,  Rfl: 0   potassium chloride  SA (KLOR-CON  M20) 20 MEQ tablet, TAKE 1 TABLET BY MOUTH 2 TIMES A DAY **MUST MAKE APPOINTMENT!**, Disp: 90 tablet, Rfl: 2   Semaglutide-Weight Management 0.25 MG/0.5ML SOAJ, Inject 0.25 mg into the skin once a week., Disp: , Rfl:    Sodium Sulfate-Mag Sulfate-KCl (SUTAB ) 1479-225-188 MG TABS, Use as directed for colonoscopy. MANUFACTURER CODES!! BIN: [id number] PCN: CN GROUP: [id number] MEMBER ID: [id number];MLW AS SECONDARY INSURANCE ;NO PRIOR AUTHORIZATION, Disp: 24 tablet, Rfl: 0   triamcinolone  cream (KENALOG ) 0.1 %, APPLY TO THE AFFECTED AREA(S) 2 TIMES A DAY, Disp: 30 g, Rfl: 0  Observations/Objective: Patient is well-developed, well-nourished in no acute distress.  Resting comfortably at home.  Head is normocephalic, atraumatic.  No labored breathing. Speech is clear and coherent with logical content.  Patient is alert and oriented at baseline.   Assessment and Plan: There are no diagnoses linked to this encounter. Patient with multiple risk  factors for complicated course of illness. Discussed risks/benefits of antiviral medications including most common potential ADRs. Patient voiced understanding and would like to proceed with antiviral medication. They are candidate for Molnupiravir . Rx sent to pharmacy. Supportive measures, OTC medications and vitamin regimen reviewed. Tessalon  per orders. Quarantine reviewed in detail. Strict ER precautions discussed with patient.    Follow Up Instructions: I discussed the assessment and treatment plan with the patient. The patient was provided an opportunity to ask questions and all were answered. The patient agreed with the plan and demonstrated an understanding of the instructions.  A copy of instructions were sent to the patient via MyChart unless otherwise noted below.   The patient was advised to call back or seek an in-person evaluation if the symptoms worsen or if the condition fails to improve as anticipated.    Tammy Velma Lunger, PA-C

## 2024-05-17 NOTE — Telephone Encounter (Signed)
 Copied from CRM #[id number]. Topic: Clinical - Medication Question >> May 17, 2024  3:55 PM Dedra B wrote: Reason for CRM: Pt tested positive for COVID today and would like to request Paxlovid called in to [facility] on [address] [place], [place].

## 2024-05-17 NOTE — Telephone Encounter (Signed)
 FYI Only or Action Required?: FYI only for provider.  Patient was last seen in primary care on 11/15/2023 by Wendolyn Jenkins Jansky, MD.  Called Nurse Triage reporting Covid Positive.  Symptoms began several days ago.  Interventions attempted: OTC medications: Delsum, Nyquil, Tylenol .  Symptoms are: stable.  Triage Disposition: See Physician Within 24 Hours (overriding Call PCP When Office is Open)  Patient/caregiver understands and will follow disposition?: Yes            Reason for Disposition  [1] COVID-19 infection suspected by caller or triager AND [2] mild symptoms (cough, fever, or others) AND [3] negative COVID-19 rapid test  Answer Assessment - Initial Assessment Questions Tested positive today, Delsum and Nyquil, Tylenol , moderate relief of symptoms   1. COVID-19 EXPOSURE: Please describe how you were exposed to someone with a COVID-19 infection.     Unknown 2. SYMPTOMS: Do you have any symptoms? (e.g., fever, cough, breathing difficulty, loss of taste or smell)     Headache, sore throat, cough, running nose, body aches, Chest pain with coughing, SOB going up the stairs 3. VACCINE: Have you gotten the COVID-19 vaccine? If Yes, ask: Which one, how many shots, when did you get it?     [place].Due . HIGH RISK: Do you have any heart or lung problems? (e.g., asthma, COPD, heart failure) Do you have a weak immune system or other risk factors? (e.g., HIV positive, chemotherapy, renal failure, diabetes mellitus, sickle cell anemia, obesity)       AFIB  Answer Assessment - Initial Assessment Questions Taking Delsum, Nyquil and Tylenol  with moderate relief.    1. COVID-19 DIAGNOSIS: How do you know that you have COVID? (e.g., positive lab test or self-test, diagnosed by doctor or NP/PA, symptoms after exposure).     Positive Test today - self test  2. ONSET: When did the COVID-19 symptoms start?      Tuesday  3. COUGH: Do you have a cough? If Yes, ask: How  bad is the cough?       Yes 4. FEVER: Do you have a fever? If Yes, ask: What is your temperature, how was it measured, and when did it start?     No 5. RESPIRATORY STATUS: Describe your breathing? (e.g., normal; shortness of breath, wheezing, unable to speak)      SOB with activity  6. OTHER SYMPTOMS: Do you have any other symptoms?  (e.g., chills, fatigue, headache, loss of smell or taste, muscle pain, sore throat)     Headache, sore throat, running nose, body aches, chest pain with coughing 7. HIGH RISK DISEASE: Do you have any chronic medical problems? (e.g., asthma, heart or lung disease, weak immune system, obesity, etc.)       AFIB 8. VACCINE: Have you had the COVID-19 vaccine? If Yes, ask: Which one, how many shots, when did you get it?       Yes  Protocols used: Coronavirus (COVID-19) Exposure-A-AH, Coronavirus (COVID-19) Diagnosed or Suspected-A-AH

## 2024-05-17 NOTE — Telephone Encounter (Deleted)
      Reatha Caldron, RN to [place], Das AM    05/17/24  4:47 PM [facility] called -- LVM to return call Reatha Caldron, RN AM   05/17/24  4:47 PM Note Copied from CRM #[id number]. Topic: Clinical - Medication Question >> May 17, 2024  3:55 PM Dedra B wrote: Reason for CRM: Pt tested positive for COVID today and would like to request Paxlovid called in to [facility] on [address] [place], [place].

## 2024-05-17 NOTE — Telephone Encounter (Signed)
 This encounter was created in error - please disregard.

## 2024-05-17 NOTE — Patient Instructions (Addendum)
 Etienne K Worthey, thank you for joining [name], PA-C for today's virtual visit.  While this provider is not your primary care provider (PCP), if your PCP is located in our provider database this encounter information will be shared with them immediately following your visit.   A [facility] MyChart account gives you access to today's visit and all your visits, tests, and labs performed at [facility]  click here if you don't have a [facility] MyChart account or go to [email or web address]  Consent: (Patient) Tammy [name] provided verbal consent for this virtual visit at the beginning of the encounter.  Current Medications:  Current Outpatient Medications:    apixaban  (ELIQUIS ) 5 MG TABS tablet, Take 1 tablet (5 mg total) by mouth 2 (two) times daily., Disp: 60 tablet, Rfl: 5   buPROPion  HCl ER, XL, 450 MG TB24, Take 1 tablet by mouth every morning., Disp: , Rfl:    busPIRone (BUSPAR) 15 MG tablet, Take 15 mg by mouth 2 (two) times daily., Disp: , Rfl:    cholecalciferol (VITAMIN D3) 25 MCG (1000 UNIT) tablet, Take 1,000 Units by mouth daily., Disp: , Rfl:    clonazePAM  (KLONOPIN ) 1 MG tablet, Take 1 tablet (1 mg total) by mouth 2 (two) times daily., Disp: 30 tablet, Rfl: 0   cyanocobalamin (VITAMIN B12) 1000 MCG tablet, Take 1,000 mcg by mouth daily., Disp: , Rfl:    diclofenac  Sodium (VOLTAREN ) 1 % GEL, APPLY SPARINGLY TO AFFECTED AREA(S) ONCE DAILY EQUIVALENT TO DICLOFENAC     GEL 1%, Disp: , Rfl:    ferrous sulfate 325 (65 FE) MG tablet, Take 325 mg by mouth daily., Disp: , Rfl:    FLUoxetine  (PROZAC ) 20 MG capsule, Take 60 mg by mouth every morning., Disp: , Rfl:    gabapentin  (NEURONTIN ) 600 MG tablet, Take 1.5 tablets (900 mg total) by mouth 3 (three) times daily., Disp: 540 tablet, Rfl: 1   ketoconazole  (NIZORAL ) 2 % cream, Apply 1 Application topically 2 (two) times daily., Disp: 60 g, Rfl: 0   levETIRAcetam  (KEPPRA ) 500 MG tablet, Take 1 tablet (500 mg  total) by mouth 2 (two) times daily., Disp: 60 tablet, Rfl: 2   Melatonin 10 MG TABS, Take 10 mg by mouth at bedtime as needed (sleep)., Disp: , Rfl:    metoprolol  succinate (TOPROL -XL) 25 MG 24 hr tablet, TAKE 1 TABLET BY MOUTH EVERY NIGHT AT BEDTIME **TAKE IF HEART RATE IS GREATER THAN 90**, Disp: 30 tablet, Rfl: 11   OLANZapine  (ZYPREXA ) 10 MG tablet, Take 10 mg by mouth at bedtime., Disp: , Rfl:    oxyCODONE  (ROXICODONE ) 5 MG immediate release tablet, Take 1 tablet (5 mg total) by mouth every 4 (four) hours as needed., Disp: 10 tablet, Rfl: 0   potassium chloride  SA (KLOR-CON  M20) 20 MEQ tablet, TAKE 1 TABLET BY MOUTH 2 TIMES A DAY **MUST MAKE APPOINTMENT!**, Disp: 90 tablet, Rfl: 2   Semaglutide-Weight Management 0.25 MG/0.5ML SOAJ, Inject 0.25 mg into the skin once a week., Disp: , Rfl:    Sodium Sulfate-Mag Sulfate-KCl (SUTAB ) 1479-225-188 MG TABS, Use as directed for colonoscopy. MANUFACTURER CODES!! BIN: [id number] PCN: CN GROUP: [id number] MEMBER ID: [id number];MLW AS SECONDARY INSURANCE ;NO PRIOR AUTHORIZATION, Disp: 24 tablet, Rfl: 0   triamcinolone  cream (KENALOG ) 0.1 %, APPLY TO THE AFFECTED AREA(S) 2 TIMES A DAY, Disp: 30 g, Rfl: 0   Medications ordered in this encounter:  No orders of the defined types were placed in this encounter.    *If you  need refills on other medications prior to your next appointment, please contact your pharmacy*  Follow-Up: Call back or seek an in-person evaluation if the symptoms worsen or if the condition fails to improve as anticipated.  [facility] [phone]  Care Instructions: Please keep well-hydrated and get plenty of rest. Start a saline nasal rinse to flush out your nasal passages. You can use plain Mucinex to help thin congestion. If you have a humidifier, running in the bedroom at night. Please take prescribed medications as directed.   Isolation Instructions: You are to isolate at home until you have been fever free for  at least 24 hours without a fever-reducing medication, and symptoms have been steadily improving for 24 hours. At that time,  you can end isolation but need to mask for an additional 5 days.   If you must be around other household members who do not have symptoms, you need to make sure that both you and the family members are masking consistently with a high-quality mask.  If you note any worsening of symptoms despite treatment, please seek an in-person evaluation ASAP. If you note any significant shortness of breath or any chest pain, please seek ER evaluation. Please do not delay care!   COVID-19: What to Do if You Are Sick If you test positive and are an older adult or someone who is at high risk of getting very sick from COVID-19, treatment may be available. Contact a healthcare provider right away after a positive test to determine if you are eligible, even if your symptoms are mild right now. You can also visit a Test to Treat location and, if eligible, receive a prescription from a provider. Don't delay: Treatment must be started within the first few days to be effective. If you have a fever, cough, or other symptoms, you might have COVID-19. Most people have mild illness and are able to recover at home. If you are sick: Keep track of your symptoms. If you have an emergency warning sign (including trouble breathing), call 911. Steps to help prevent the spread of COVID-19 if you are sick If you are sick with COVID-19 or think you might have COVID-19, follow the steps below to care for yourself and to help protect other people in your home and community. Stay home except to get medical care Stay home. Most people with COVID-19 have mild illness and can recover at home without medical care. Do not leave your home, except to get medical care. Do not visit public areas and do not go to places where you are unable to wear a mask. Take care of yourself. Get rest and stay hydrated. Take  over-the-counter medicines, such as acetaminophen , to help you feel better. Stay in touch with your doctor. Call before you get medical care. Be sure to get care if you have trouble breathing, or have any other emergency warning signs, or if you think it is an emergency. Avoid public transportation, ride-sharing, or taxis if possible. Get tested If you have symptoms of COVID-19, get tested. While waiting for test results, stay away from others, including staying apart from those living in your household. Get tested as soon as possible after your symptoms start. Treatments may be available for people with COVID-19 who are at risk for becoming very sick. Don't delay: Treatment must be started early to be effective--some treatments must begin within 5 days of your first symptoms. Contact your healthcare provider right away if your test result is positive  to determine if you are eligible. Self-tests are one of several options for testing for the virus that causes COVID-19 and may be more convenient than laboratory-based tests and point-of-care tests. Ask your healthcare provider or your local health department if you need help interpreting your test results. You can visit your state, tribal, local, and territorial health department's website to look for the latest local information on testing sites. Separate yourself from other people As much as possible, stay in a specific room and away from other people and pets in your home. If possible, you should use a separate bathroom. If you need to be around other people or animals in or outside of the home, wear a well-fitting mask. Tell your close contacts that they may have been exposed to COVID-19. An infected person can spread COVID-19 starting 48 hours (or 2 days) before the person has any symptoms or tests positive. By letting your close contacts know they may have been exposed to COVID-19, you are helping to protect everyone. See COVID-19 and Animals if you  have questions about pets. If you are diagnosed with COVID-19, someone from the health department may call you. Answer the call to slow the spread. Monitor your symptoms Symptoms of COVID-19 include fever, cough, or other symptoms. Follow care instructions from your healthcare provider and local health department. Your local health authorities may give instructions on checking your symptoms and reporting information. When to seek emergency medical attention Look for emergency warning signs* for COVID-19. If someone is showing any of these signs, seek emergency medical care immediately: Trouble breathing Persistent pain or pressure in the chest New confusion Inability to wake or stay awake Pale, gray, or blue-colored skin, lips, or nail beds, depending on skin tone *This list is not all possible symptoms. Please call your medical provider for any other symptoms that are severe or concerning to you. Call 911 or call ahead to your local emergency facility: Notify the operator that you are seeking care for someone who has or may have COVID-19. Call ahead before visiting your doctor Call ahead. Many medical visits for routine care are being postponed or done by phone or telemedicine. If you have a medical appointment that cannot be postponed, call your doctor's office, and tell them you have or may have COVID-19. This will help the office protect themselves and other patients. If you are sick, wear a well-fitting mask You should wear a mask if you must be around other people or animals, including pets (even at home). Wear a mask with the best fit, protection, and comfort for you. You don't need to wear the mask if you are alone. If you can't put on a mask (because of trouble breathing, for example), cover your coughs and sneezes in some other way. Try to stay at least 6 feet away from other people. This will help protect the people around you. Masks should not be placed on young children under age 75  years, anyone who has trouble breathing, or anyone who is not able to remove the mask without help. Cover your coughs and sneezes Cover your mouth and nose with a tissue when you cough or sneeze. Throw away used tissues in a lined trash can. Immediately wash your hands with soap and water  for at least 20 seconds. If soap and water  are not available, clean your hands with an alcohol-based hand sanitizer that contains at least 60% alcohol. Clean your hands often Wash your hands often with soap and water  for at  least 20 seconds. This is especially important after blowing your nose, coughing, or sneezing; going to the bathroom; and before eating or preparing food. Use hand sanitizer if soap and water  are not available. Use an alcohol-based hand sanitizer with at least 60% alcohol, covering all surfaces of your hands and rubbing them together until they feel dry. Soap and water  are the best option, especially if hands are visibly dirty. Avoid touching your eyes, nose, and mouth with unwashed hands. Handwashing Tips Avoid sharing personal household items Do not share dishes, drinking glasses, cups, eating utensils, towels, or bedding with other people in your home. Wash these items thoroughly after using them with soap and water  or put in the dishwasher. Clean surfaces in your home regularly Clean and disinfect high-touch surfaces (for example, doorknobs, tables, handles, light switches, and countertops) in your sick room and bathroom. In shared spaces, you should clean and disinfect surfaces and items after each use by the person who is ill. If you are sick and cannot clean, a caregiver or other person should only clean and disinfect the area around you (such as your bedroom and bathroom) on an as needed basis. Your caregiver/other person should wait as long as possible (at least several hours) and wear a mask before entering, cleaning, and disinfecting shared spaces that you use. Clean and disinfect  areas that may have blood, stool, or body fluids on them. Use household cleaners and disinfectants. Clean visible dirty surfaces with household cleaners containing soap or detergent. Then, use a household disinfectant. Use a product from [employer] List N: Disinfectants for Coronavirus (COVID-19). Be sure to follow the instructions on the label to ensure safe and effective use of the product. Many products recommend keeping the surface wet with a disinfectant for a certain period of time (look at contact time on the product label). You may also need to wear personal protective equipment, such as gloves, depending on the directions on the product label. Immediately after disinfecting, wash your hands with soap and water  for 20 seconds. For completed guidance on cleaning and disinfecting your home, visit Complete Disinfection Guidance. Take steps to improve ventilation at home Improve ventilation (air flow) at home to help prevent from spreading COVID-19 to other people in your household. Clear out COVID-19 virus particles in the air by opening windows, using air filters, and turning on fans in your home. Use this interactive tool to learn how to improve air flow in your home. When you can be around others after being sick with COVID-19 Deciding when you can be around others is different for different situations. Find out when you can safely end home isolation. For any additional questions about your care, contact your healthcare provider or state or local health department. 12/23/2020 Content source: [facility] for Immunization and Respiratory Diseases ([facility]), Division of Viral Diseases This information is not intended to replace advice given to you by your health care provider. Make sure you discuss any questions you have with your health care provider. Document Revised: 02/05/2021 Document Reviewed: 02/05/2021 Elsevier Patient Education  2022 [employer].     If you have been instructed  to have an in-person evaluation today at a local Urgent Care facility, please use the link below. It will take you to a list of all of our available [facility], including address, phone number and hours of operation. Please do not delay care.  [facility]  If you or a family member do not have a  primary care provider, use the link below to schedule a visit and establish care. When you choose a [facility] primary care physician or advanced practice provider, you gain a long-term partner in health. Find a Primary Care Provider  Learn more about [facility]'s in-office and virtual care options:  - Get Care Now

## 2024-06-05 ENCOUNTER — Telehealth (INDEPENDENT_AMBULATORY_CARE_PROVIDER_SITE_OTHER): Admitting: Family Medicine

## 2024-06-05 ENCOUNTER — Telehealth: Payer: Self-pay | Admitting: *Deleted

## 2024-06-05 ENCOUNTER — Encounter: Payer: Self-pay | Admitting: Family Medicine

## 2024-06-05 DIAGNOSIS — U071 COVID-19: Secondary | ICD-10-CM

## 2024-06-05 DIAGNOSIS — J40 Bronchitis, not specified as acute or chronic: Secondary | ICD-10-CM | POA: Diagnosis not present

## 2024-06-05 MED ORDER — CEFDINIR 300 MG PO CAPS
300.0000 mg | ORAL_CAPSULE | Freq: Two times a day (BID) | ORAL | 0 refills | Status: AC
Start: 2024-06-05 — End: ?

## 2024-06-05 MED ORDER — BENZONATATE 100 MG PO CAPS: 100.0000 mg | ORAL_CAPSULE | Freq: Three times a day (TID) | ORAL | 0 refills | Status: AC | PRN

## 2024-06-05 NOTE — Telephone Encounter (Signed)
 Copied from CRM [id number]. Topic: Appointments - Appointment Scheduling >> Jun 05, 2024  1:03 PM Tysheama G wrote: Patient/patient representative is calling to schedule an appointment. Refer to attachments for appointment information. Patient would like to change appointment to virtual instead of in person due to having covid

## 2024-06-05 NOTE — Telephone Encounter (Signed)
 Copied from CRM #[id number]. Topic: Clinical - Medication Question >> Jun 05, 2024  8:35 AM Revonda D wrote: Reason for CRM: Pt's husband is requesting a short supply of antibiotics for Covid. Pt has an appt schedule for tomorrow but wanted to get some medication to take until the appt. Pt's husband Camellia would like a callback with an update. CB [phone]

## 2024-06-05 NOTE — Telephone Encounter (Signed)
 Patient scheduled for today at 2 pm with pcp.

## 2024-06-05 NOTE — Progress Notes (Signed)
 MyChart Video Visit Virtual Visit via Video Note   This visit type was conducted w/patient consent. This format is felt to be most appropriate for this patient at this time. Physical exam was limited by quality of the video and audio technology used for the visit. CMA was able to get the patient set up on a video visit.  Patient location: Home. Patient and provider in visit Provider location: Office  I discussed the limitations of evaluation and management by telemedicine and the availability of in person appointments. The patient expressed understanding and agreed to proceed.  Visit Date: 06/05/2024  Today's healthcare provider: Jenkins CHRISTELLA Carrel, MD     Subjective:    Patient ID: Tammy [name], female    DOB: 06/14/1961, 63 y.o.   MRN: [id number]  Chief Complaint  Patient presents with   Covid Positive    Patient tested positive for Covid  Sx started 05/15/24, requesting medication   Cough    Taking Delsym, Tylenol , and Dayquil   Headache    Headache for a few days   Sore Throat    HPI Discussed the use of AI scribe software for clinical note transcription with the patient, who gave verbal consent to proceed.  History of Present Illness A 63 year old female presents with persistent symptoms following a COVID-19 infection.  She began experiencing symptoms on August 12th, following a trip to [place] on August 9th. Her symptoms included severe body aches, described as 'someone banging on my joints with a hammer', a severe headache, runny nose, and a low-grade fever. Despite treatment, her symptoms have not improved and she feels worse.  She continues to experience significant body aches, cold chills, headaches, and a persistent cough. The runny nose has resolved, but she has increased shortness of breath compared to her usual state.  On August 14th, she had a video visit and was prescribed Mulnipivir for COVID-19, which she took as directed, but there was no improvement in her  symptoms. She currently has no vomiting or diarrhea.  Her current medications include an albuterol  inhaler, which she uses as needed, and Tessalon  Perles for her cough, which she finds more effective than over-the-counter options and requests a refill.    Past Medical History:  Diagnosis Date   Anemia    Anxiety    Arthritis    neck and lower back - deg disc disease, fingers   Atrial fibrillation and flutter (HCC)    COVID-19 2020   Depression    Frequency of urination    GERD (gastroesophageal reflux disease)    History of seizure per pt no seizure since    11-07-2015  in setting of stress and sleep deprivation  /  negative work-up by neurologist unknown idiology   Injury of thumb, left    03-31-2016     Pelvic relaxation    Pneumonia    SUI (stress urinary incontinence, female)     Past Surgical History:  Procedure Laterality Date   ANTERIOR AND POSTERIOR REPAIR  09/25/2012   Procedure: ANTERIOR (CYSTOCELE) AND POSTERIOR REPAIR (RECTOCELE);  Surgeon: Olam Mill, MD;  Location: [facility];  Service: Gynecology;  Laterality: N/A;   ANTERIOR AND POSTERIOR REPAIR WITH SACROSPINOUS FIXATION N/A 04/12/2016   Procedure: ANTERIOR AND POSTERIOR REPAIR WITH SACROSPINOUS LIGAMENT SUSPENSION, CYSTOSCOPY;  Surgeon: Norleen Skill, MD;  Location: [facility];  Service: Gynecology;  Laterality: N/A;   APPENDECTOMY  1987   BREAST EXCISIONAL BIOPSY Left    No scar visable  BREAST REDUCTION SURGERY  2000  aprrox   ENDOMETRIAL ABLATION W/ NOVASURE  2011   in office   EXCISION LEFT BREAST BX  12-07-1999   benign   LAPAROSCOPIC CHOLECYSTECTOMY  02-09-2005   LAPAROSCOPIC VAGINAL HYSTERECTOMY WITH SALPINGO OOPHORECTOMY Bilateral 04/12/2016   Procedure: LAPAROSCOPIC ASSISTED VAGINAL HYSTERECTOMY WITH SALPINGO OOPHORECTOMY;  Surgeon: Norleen Skill, MD;  Location: [facility] [place];  Service: Gynecology;  Laterality: Bilateral;   ORIF ANKLE FRACTURE Left 05/21/2022    Procedure: OPEN REDUCTION INTERNAL FIXATION (ORIF) LEFT DISTAL FIBULA FRACTURE;  Surgeon: Harden Jerona GAILS, MD;  Location: [facility] OR;  Service: Orthopedics;  Laterality: Left;   ORIF RADIAL FRACTURE Left 02/09/2021   Procedure: ORIF LEFT DISTAL RADIUS FRACTURE;  Surgeon: Elisabeth Craig RAMAN, MD;  Location: [facility];  Service: Plastics;  Laterality: Left;  90 min   REDUCTION MAMMAPLASTY Bilateral    ROUX-EN-Y GASTRIC BYPASS  06/  2002   TUBAL LIGATION  10/ 1990    Outpatient Medications Prior to Visit  Medication Sig Dispense Refill   albuterol  (VENTOLIN  HFA) 108 (90 Base) MCG/ACT inhaler Inhale 2 puffs into the lungs every 6 (six) hours as needed for wheezing or shortness of breath. 8 g 0   apixaban  (ELIQUIS ) 5 MG TABS tablet Take 1 tablet (5 mg total) by mouth 2 (two) times daily. 60 tablet 5   buPROPion  (WELLBUTRIN  XL) 150 MG 24 hr tablet Take by mouth.     busPIRone (BUSPAR) 15 MG tablet Take 15 mg by mouth 2 (two) times daily.     cholecalciferol (VITAMIN D3) 25 MCG (1000 UNIT) tablet Take 1,000 Units by mouth daily.     clonazePAM  (KLONOPIN ) 1 MG tablet Take 1 tablet (1 mg total) by mouth 2 (two) times daily. 30 tablet 0   cyanocobalamin (VITAMIN B12) 1000 MCG tablet Take 1,000 mcg by mouth daily.     diclofenac  Sodium (VOLTAREN ) 1 % GEL APPLY SPARINGLY TO AFFECTED AREA(S) ONCE DAILY EQUIVALENT TO DICLOFENAC     GEL 1%     ferrous sulfate 325 (65 FE) MG tablet Take 325 mg by mouth daily.     FLUoxetine  (PROZAC ) 20 MG capsule Take 60 mg by mouth every morning.     gabapentin  (NEURONTIN ) 600 MG tablet Take 1.5 tablets (900 mg total) by mouth 3 (three) times daily. 540 tablet 1   ketoconazole  (NIZORAL ) 2 % cream Apply 1 Application topically 2 (two) times daily. 60 g 0   levETIRAcetam  (KEPPRA ) 500 MG tablet Take 500 mg by mouth 2 (two) times daily.     Melatonin 10 MG TABS Take 10 mg by mouth at bedtime as needed (sleep).     metoprolol  succinate (TOPROL -XL) 25 MG 24 hr tablet TAKE 1 TABLET BY MOUTH EVERY  NIGHT AT BEDTIME **TAKE IF HEART RATE IS GREATER THAN 90** 30 tablet 11   OLANZapine  (ZYPREXA ) 10 MG tablet Take 10 mg by mouth at bedtime.     potassium chloride  SA (KLOR-CON  M20) 20 MEQ tablet TAKE 1 TABLET BY MOUTH 2 TIMES A DAY **MUST MAKE APPOINTMENT!** 90 tablet 2   Semaglutide-Weight Management 0.25 MG/0.5ML SOAJ Inject 0.25 mg into the skin once a week.     Sodium Sulfate-Mag Sulfate-KCl (SUTAB ) 1479-225-188 MG TABS Use as directed for colonoscopy. MANUFACTURER CODES!! BIN: [id number] PCN: CN GROUP: [id number] MEMBER ID: [id number];MLW AS SECONDARY INSURANCE ;NO PRIOR AUTHORIZATION 24 tablet 0   triamcinolone  cream (KENALOG ) 0.1 % APPLY TO THE AFFECTED AREA(S) 2 TIMES A DAY 30 g 0  Vitamin D, Ergocalciferol, (DRISDOL) 1.25 MG (50000 UNIT) CAPS capsule Take 50,000 Units by mouth.     benzonatate  (TESSALON ) 100 MG capsule Take 1 capsule (100 mg total) by mouth 3 (three) times daily as needed for cough. 30 capsule 0   oxyCODONE  (ROXICODONE ) 5 MG immediate release tablet Take 1 tablet (5 mg total) by mouth every 4 (four) hours as needed. (Patient not taking: Reported on 06/05/2024) 10 tablet 0   No facility-administered medications prior to visit.    Allergies  Allergen Reactions   Nsaids Other (See Comments)    Post Gastric Bypass  Contraindicated post  Gastric Bypass   Latex Itching, Rash and Other (See Comments)   Other Other (See Comments) and Dermatitis    Any tape OK as long as does not contain latex   Sulfa Antibiotics Itching, Rash and Other (See Comments)   Tape Itching and Rash        Objective:     Physical Exam  Vitals and nursing note reviewed.  Constitutional:      General:  is not in acute distress.    Appearance: Normal appearance.  HENT:     Head: Normocephalic.  Pulmonary:     Effort: No respiratory distress.  Skin:    General: Skin is dry.     Coloration: Skin is not pale.  Neurological:     Mental Status: Pt is alert and oriented to person, place, and  time.  Psychiatric:        Mood and Affect: Mood normal.   LMP 09/30/2012   Wt Readings from Last 3 Encounters:  11/17/23 246 lb 9.6 oz (111.9 kg)  11/15/23 247 lb (112 kg)  11/08/23 244 lb (110.7 kg)       Assessment & Plan:   Problem List Items Addressed This Visit   None Visit Diagnoses       Bronchitis    -  Primary     COVID-19       Relevant Medications   cefdinir  (OMNICEF ) 300 MG capsule     Assessment and Plan Assessment & Plan COVID-19 infection with persistent symptoms   Persistent symptoms since August 12 include myalgia, cephalgia, and dyspnea. Initial treatment with mulnipivir was ineffective. Differential diagnosis considers possible superimposed pneumonia. She is afebrile with no emesis or diarrhea. Further COVID-19 testing is not recommended due to potential prolonged positivity. Prescribe Omnicef  for potential superimposed pneumonia/bronchitis. Advise her to report symptom improvement by Wednesday night via MyChart. Instruct to seek emergency care if symptoms worsen significantly, such as increased dyspnea. Encourage adequate hydration and use of acetaminophen  for myalgia and fever.  Bronchitis, not specified as acute or chronic   Persistent cough since August 12 may be related to bronchitis or COVID-19 infection. Refill Tessalon  Perles for cough management. If no improvement in 48 hrs, sch in person appt    Meds ordered this encounter  Medications   benzonatate  (TESSALON ) 100 MG capsule    Sig: Take 1 capsule (100 mg total) by mouth 3 (three) times daily as needed for cough.    Dispense:  30 capsule    Refill:  0   cefdinir  (OMNICEF ) 300 MG capsule    Sig: Take 1 capsule (300 mg total) by mouth 2 (two) times daily.    Dispense:  14 capsule    Refill:  0    I discussed the assessment and treatment plan with the patient. The patient was provided an opportunity to ask questions and all were answered. The  patient agreed with the plan and demonstrated an  understanding of the instructions.   The patient was advised to call back or seek an in-person evaluation if the symptoms worsen or if the condition fails to improve as anticipated.  Return if symptoms worsen or fail to improve.  Jenkins CHRISTELLA Carrel, MD [facility] [phone] (phone) [phone] (fax)  [facility]

## 2024-06-05 NOTE — Telephone Encounter (Signed)
 Patient scheduled for today at 2pm with pcp for a vv.

## 2024-06-05 NOTE — Patient Instructions (Addendum)
 It was very nice to see you today!  Tylenol .  Sent tessalon  perles and antibiotics.  Worse consider ER Not improving some in 48 hrs, schedule in person appt   PLEASE NOTE:  If you had any lab tests please let us  know if you have not heard back within a few days. You may see your results on MyChart before we have a chance to review them but we will give you a call once they are reviewed by us . If we ordered any referrals today, please let us  know if you have not heard from their office within the next week.   Please try these tips to maintain a healthy lifestyle:  Eat most of your calories during the day when you are active. Eliminate processed foods including packaged sweets (pies, cakes, cookies), reduce intake of potatoes, white bread, white pasta, and white rice. Look for whole grain options, oat flour or almond flour.  Each meal should contain half fruits/vegetables, one quarter protein, and one quarter carbs (no bigger than a computer mouse).  Cut down on sweet beverages. This includes juice, soda, and sweet tea. Also watch fruit intake, though this is a healthier sweet option, it still contains natural sugar! Limit to 3 servings daily.  Drink at least 1 glass of water  with each meal and aim for at least 8 glasses per day  Exercise at least 150 minutes every week.

## 2024-06-05 NOTE — Telephone Encounter (Signed)
 Left message to return call

## 2024-06-06 ENCOUNTER — Ambulatory Visit: Admitting: Family Medicine

## 2024-06-22 ENCOUNTER — Telehealth: Payer: Self-pay | Admitting: Cardiology

## 2024-06-22 ENCOUNTER — Other Ambulatory Visit: Payer: Self-pay | Admitting: Cardiology

## 2024-06-22 MED ORDER — POTASSIUM CHLORIDE CRYS ER 20 MEQ PO TBCR: 20.0000 meq | EXTENDED_RELEASE_TABLET | Freq: Two times a day (BID) | ORAL | 1 refills | Status: AC

## 2024-06-22 NOTE — Telephone Encounter (Signed)
*  STAT* If patient is at the pharmacy, call can be transferred to refill team.   1. Which medications need to be refilled? (please list name of each medication and dose if known)   potassium chloride  SA (KLOR-CON  M20) 20 MEQ tablet     2. Would you like to learn more about the convenience, safety, & potential cost savings by using the [facility]? No   3. Are you open to using the [facility] (Type [facility].No    4. Which pharmacy/location (including street and city if local pharmacy) is medication to be sent to?[facility] [id number] - [place], [place] - [address]    5. Do they need a 30 day or 90 day supply? 90 day    Pt has appt scheduled 07/13/24

## 2024-06-22 NOTE — Telephone Encounter (Signed)
 Pt's medication was sent to pt's pharmacy as requested. Confirmation received.

## 2024-07-12 NOTE — Progress Notes (Unsigned)
  Cardiology Office Note:   Date:  07/12/2024  ID:  Tammy [name], DOB 28-Nov-1960, MRN [id number] PCP: Wendolyn Jenkins Jansky, MD  [facility] Providers Cardiologist:  Lynwood Schilling, MD {  History of Present Illness:   Tammy [name] is a 63 y.o. female  with a history of atrial fibrillation.  Her atrial fibrillation initially followed an episode of community-acquired pneumonia.  She was found to have atrial flutter on outpatient monitoring.  She has been diagnosed and treated for obstructive sleep apnea.  She is been managed with anticoagulation.   ***   ***  On last office visit she reported that she occasionally feels palpitations.  These are very brief.  She is not having any pain.  Having any chest pressure, neck or arm discomfort.  She has no shortness of breath, PND or orthopnea.  She has not been as active as I would like.  She has been tolerating her anticoagulation.   Today she is here with a sling on her right arm.  She dislocated her right shoulder while getting up out of a couch.  Was seen in the ED and had this reduced.  She is due to see her orthopedic physician Dr. Emiliano on February 17 and is having an MRI tomorrow to evaluate need for surgery.  She has a lot of bruising in her right shoulder and right breast.  She continues to have a good bit of soreness and pain.  She denies any rapid heart rate or palpitations.  She is medically compliant.  ROS: ***  Studies Reviewed:    EKG:       ***  Risk Assessment/Calculations:   {Does this patient have ATRIAL FIBRILLATION?[phone]} No BP recorded.  {Refresh Note OR Click here to enter BP  :1}***        Physical Exam:   VS:  LMP 09/30/2012    Wt Readings from Last 3 Encounters:  11/17/23 246 lb 9.6 oz (111.9 kg)  11/15/23 247 lb (112 kg)  11/08/23 244 lb (110.7 kg)     GEN: Well nourished, well developed in no acute distress NECK: No JVD; No carotid bruits CARDIAC: ***RR, *** murmurs, rubs, gallops RESPIRATORY:   Clear to auscultation without rales, wheezing or rhonchi  ABDOMEN: Soft, non-tender, non-distended EXTREMITIES:  No edema; No deformity   ASSESSMENT AND PLAN:   PAF/FLUTTER:   ***   Ms. Tammy [name] has a CHA2DS2 - VASc score of 2.    She tolerates anticoagulation.  Her paroxysms are very infrequent and she is not having any sustained tachyarrhythmias.  No change in therapy. Remains in NSR, slightly tachycardic. She is given refills on metoprolol  and apixaban .   SLEEP APNEA:   ***  She is unable to use CPAP.  She has tried multiple masks.   HTN: Her blood pressure is *** well controlled as transiently been elevated but it is well controlled today.  Of asked her to keep an eye on this at home.  She wilSling to rightl continue the meds as listed.   *** DISLOCATED RIGHT SHOULDER: She is to have MRI tomorrow and see orthopedic surgeon on 11/21/2023 to discuss need for surgery. She is cleared from cardiac standpoint as she is able to perform at least 6 METS. Surgeon will need to contact us  for instructions about holding Eliquis  pre-operatively.       Follow up ***  Signed, Lynwood Schilling, MD

## 2024-07-13 ENCOUNTER — Ambulatory Visit: Attending: Cardiology | Admitting: Cardiology

## 2024-07-13 ENCOUNTER — Ambulatory Visit: Payer: Self-pay

## 2024-07-13 ENCOUNTER — Encounter: Payer: Self-pay | Admitting: Cardiology

## 2024-07-13 VITALS — BP 114/72 | HR 68 | Ht 66.0 in | Wt 249.2 lb

## 2024-07-13 DIAGNOSIS — I480 Paroxysmal atrial fibrillation: Secondary | ICD-10-CM | POA: Diagnosis not present

## 2024-07-13 DIAGNOSIS — I10 Essential (primary) hypertension: Secondary | ICD-10-CM | POA: Diagnosis not present

## 2024-07-13 NOTE — Telephone Encounter (Signed)
 Noted

## 2024-07-13 NOTE — Telephone Encounter (Addendum)
 FYI Only or Action Required?: FYI only for provider.  Patient was last seen in primary care on 06/05/2024 by Wendolyn Jenkins Jansky, MD.  Called Nurse Triage reporting Chest Pain.  Symptoms began several days ago.  Interventions attempted: OTC medications: tylenol .  Symptoms are: unchanged.  Triage Disposition: See Physician Within 24 Hours  Patient/caregiver understands and will follow disposition?: Yes  Copied from CRM [id number]. Topic: Clinical - Red Word Triage >> Jul 13, 2024 11:16 AM Tammy [name] wrote: [facility] that prompted transfer to Nurse Triage: Patient states she was picking up her 60 pound dog, and felt a pop and now her ribs are very painful and hurts to breathe Reason for Disposition  [1] Chest pain lasts > 5 minutes AND [2] occurred > 3 days ago (72 hours) AND [3] NO chest pain or cardiac symptoms now  Answer Assessment - Initial Assessment Questions 1. LOCATION: Where does it hurt?       Left rib pain; picked up 60lb dog and felt pop, right under breast 2. RADIATION: Does the pain go anywhere else? (e.g., into neck, jaw, arms, back)     Going around to mid back 3. ONSET: When did the chest pain begin? (Minutes, hours or days)      Wednesday 4. PATTERN: Does the pain come and go, or has it been constant since it started?  Does it get worse with exertion?      Constant pain, hurts to lift arm 5. DURATION: How long does it last (e.g., seconds, minutes, hours)     ongoing 6. SEVERITY: How bad is the pain?  (e.g., Scale 1-10; mild, moderate, or severe)     9/10 7. CARDIAC RISK FACTORS: Do you have any history of heart problems or risk factors for heart disease? (e.g., angina, prior heart attack; diabetes, high blood pressure, high cholesterol, smoker, or strong family history of heart disease)     no 8. PULMONARY RISK FACTORS: Do you have any history of lung disease?  (e.g., blood clots in lung, asthma, emphysema, birth control pills)     no 9. CAUSE: What  do you think is causing the chest pain?     Lifting dog 10. OTHER SYMPTOMS: Do you have any other symptoms? (e.g., dizziness, nausea, vomiting, sweating, fever, difficulty breathing, cough)       Hurts when I breath in, Denies, dizziness, fever, chills, sweating, n/v, cough, Used: tylenol , heating  Protocols used: Chest Pain-A-AH No available appts today. Advised UC today and ED if symptoms worsen. Patient reports will got to [facility], currently at cardiology appt.

## 2024-07-13 NOTE — Patient Instructions (Signed)
 Medication Instructions:  Your physician recommends that you continue on your current medications as directed. Please refer to the Current Medication list given to you today.  *If you need a refill on your cardiac medications before your next appointment, please call your pharmacy*  Lab Work: None ordered If you have labs (blood work) drawn today and your tests are completely normal, you will receive your results only by: MyChart Message (if you have MyChart) OR A paper copy in the mail If you have any lab test that is abnormal or we need to change your treatment, we will call you to review the results.  Follow-Up: At [facility], you and your health needs are our priority.  As part of our continuing mission to provide you with exceptional heart care, our providers are all part of one team.  This team includes your primary Cardiologist (physician) and Advanced Practice Providers or APPs (Physician Assistants and Nurse Practitioners) who all work together to provide you with the care you need, when you need it.  Your next appointment:   1 year(s)  Provider:   Lynwood Schilling, MD

## 2024-07-25 ENCOUNTER — Ambulatory Visit: Payer: Self-pay

## 2024-07-25 NOTE — Telephone Encounter (Signed)
 FYI Only or Action Required?: FYI only for provider.  Patient was last seen in primary care on 06/05/2024 by Wendolyn Jenkins Jansky, MD.  Called Nurse Triage reporting Rib Injury.  Symptoms began several weeks ago.  Interventions attempted: OTC medications: Tylenol  and Prescription medications: prenisone, muscle relaxer.  Symptoms are: unchanged.  Triage Disposition: See Physician Within 24 Hours  Patient/caregiver understands and will follow disposition?: Yes  Copied from CRM #[id number]. Topic: Clinical - Red Word Triage >> Jul 25, 2024  9:43 AM Robinson H wrote: [facility] that prompted transfer to Nurse Triage: Fractured ribs and given muscle relaxers and prednisone  2 weeks ago at urgent not working still in a lot of pain, trouble breathing hurts when she breathes Reason for Disposition  [1] MODERATE pain (e.g., interferes with normal activities) AND [2] high-risk adult (e.g., age > 60 years, osteoporosis, chronic steroid use)  Answer Assessment - Initial Assessment Questions Patient with rib fracture from picking up her 60lb dog. She heard the pop. Has been seen in UC and given 5 day course of prednisone , muscle relaxer, is taking tylenol  pretty constantly. She is having a hard time sleeping and taking a deep breath. She denies SOB. Pain is constant and feels like she can never get comfortable.  ED/UC/Call back instructions given and understood. Pt with appt in AM tomorrow. Will try to support the injured side with deep breaths.   1. MECHANISM: How did the injury happen?     Woke up with the pain  2. ONSET: When did the injury happen? (.e.g., minutes, hours, days ago)     2 weeks ago 3. LOCATION: Where on the chest is the injury located?     Left side  4. APPEARANCE: What does the injury look like?     Looks normal  5. BLEEDING: Is there any bleeding now? If Yes, ask: How long has it been bleeding?     denies 6. SEVERITY: Any difficulty with breathing?     Trouble taking a  deep breath. She cannot sleep because she cannot  8. PAIN: Is there pain? If Yes, ask: How bad is the pain? (e.g., Scale 0-10; none, mild, moderate, severe)     9/10  Protocols used: Chest Injury-A-AH

## 2024-07-26 ENCOUNTER — Ambulatory Visit: Admitting: Family Medicine

## 2024-08-06 ENCOUNTER — Encounter: Payer: Self-pay | Admitting: Radiology

## 2024-08-27 ENCOUNTER — Encounter (HOSPITAL_COMMUNITY): Payer: Self-pay

## 2024-08-27 ENCOUNTER — Other Ambulatory Visit: Payer: Self-pay

## 2024-08-27 ENCOUNTER — Emergency Department (HOSPITAL_COMMUNITY)
Admission: EM | Admit: 2024-08-27 | Discharge: 2024-08-27 | Disposition: A | Discharge status: Home / Self Care | Attending: Emergency Medicine | Admitting: Emergency Medicine

## 2024-08-27 DIAGNOSIS — R040 Epistaxis: Secondary | ICD-10-CM | POA: Diagnosis present

## 2024-08-27 DIAGNOSIS — Z7901 Long term (current) use of anticoagulants: Secondary | ICD-10-CM | POA: Diagnosis not present

## 2024-08-27 DIAGNOSIS — Z91040 Latex allergy status: Secondary | ICD-10-CM | POA: Diagnosis not present

## 2024-08-27 LAB — BASIC METABOLIC PANEL WITH GFR
Anion gap: 9 (ref 5–15)
BUN: 22 mg/dL (ref 8–23)
CO2: 21 mmol/L — ABNORMAL LOW (ref 22–32)
Calcium: 7.8 mg/dL — ABNORMAL LOW (ref 8.9–10.3)
Chloride: 112 mmol/L — ABNORMAL HIGH (ref 98–111)
Creatinine, Ser: 0.67 mg/dL (ref 0.44–1.00)
GFR, Estimated: >60 mL/min (ref >60)
Glucose, Bld: 82 mg/dL (ref 70–99)
Potassium: 4.3 mmol/L (ref 3.5–5.1)
Sodium: 141 mmol/L (ref 135–145)

## 2024-08-27 LAB — CBC
HCT: 41.4 % (ref 36.0–46.0)
Hemoglobin: 13 g/dL (ref 12.0–15.0)
MCH: 28.2 pg (ref 26.0–34.0)
MCHC: 31.4 g/dL (ref 30.0–36.0)
MCV: 89.8 fL (ref 80.0–100.0)
Platelets: 254 K/uL (ref 150–400)
RBC: 4.61 MIL/uL (ref 3.87–5.11)
RDW: 15.3 % (ref 11.5–15.5)
WBC: 6.8 K/uL (ref 4.0–10.5)
nRBC: 0 % (ref 0.0–0.2)

## 2024-08-27 MED ORDER — SILVER NITRATE-POT NITRATE 75-25 % EX MISC
1.0000 | Freq: Once | CUTANEOUS | Status: AC
  Administered 2024-08-27: 1 via TOPICAL
  Filled 2024-08-27: qty 10

## 2024-08-27 MED ORDER — LIDOCAINE-EPINEPHRINE-TETRACAINE (LET) TOPICAL GEL
3.0000 mL | Freq: Once | TOPICAL | Status: AC
  Administered 2024-08-27: 3 mL via TOPICAL
  Filled 2024-08-27: qty 3

## 2024-08-27 MED ORDER — OXYMETAZOLINE HCL 0.05 % NA SOLN
1.0000 | Freq: Once | NASAL | Status: AC
Start: 2024-08-27 — End: 2024-08-27
  Administered 2024-08-27: 1 via NASAL
  Filled 2024-08-27: qty 30

## 2024-08-27 NOTE — ED Triage Notes (Signed)
 Pt BIB [facility] from [facility]. Pt A&O, reports continuous nose bleed for past 12 hours. Pt reports SOB and Nausea. Per [facility] Nurse on scene administered Zofran . Pt is on Eliquis  for afib.

## 2024-08-27 NOTE — ED Provider Notes (Signed)
 [place] EMERGENCY DEPARTMENT AT [facility] Provider Note   CSN: [id number] Arrival date & time: 08/27/24  1214     Patient presents with: Epistaxis   Tammy [name] is a 63 y.o. female.   The history is provided by the patient.  Epistaxis Location:  R nare Severity:  Moderate Duration:  12 hours Timing:  Constant Progression:  Resolved Chronicity:  New Context: anticoagulants   Relieved by:  Nothing Worsened by:  Nothing Ineffective treatments:  Applying pressure Associated symptoms: blood in oropharynx   Associated symptoms: no dizziness and no headaches        Prior to Admission medications   Medication Sig Start Date End Date Taking? Authorizing Provider  albuterol  (VENTOLIN  HFA) 108 (90 Base) MCG/ACT inhaler Inhale 2 puffs into the lungs every 6 (six) hours as needed for wheezing or shortness of breath. 05/17/24   Gladis Elsie BROCKS, PA-C  apixaban  (ELIQUIS ) 5 MG TABS tablet Take 1 tablet (5 mg total) by mouth 2 (two) times daily. 11/17/23   Jerilynn Lamarr HERO, NP  benzonatate  (TESSALON ) 100 MG capsule Take 1 capsule (100 mg total) by mouth 3 (three) times daily as needed for cough. 06/05/24   Wendolyn Jenkins Jansky, MD  buPROPion  (WELLBUTRIN  XL) 150 MG 24 hr tablet Take by mouth. 01/04/24   [provider]  busPIRone (BUSPAR) 15 MG tablet Take 15 mg by mouth 2 (two) times daily. 03/18/23   [provider]  cefdinir  (OMNICEF ) 300 MG capsule Take 1 capsule (300 mg total) by mouth 2 (two) times daily. 06/05/24   Wendolyn Jenkins Jansky, MD  cholecalciferol (VITAMIN D3) 25 MCG (1000 UNIT) tablet Take 1,000 Units by mouth daily.    [provider]  clonazePAM  (KLONOPIN ) 1 MG tablet Take 1 tablet (1 mg total) by mouth 2 (two) times daily. 10/25/21   Samtani, Jai-Gurmukh, MD  cyanocobalamin (VITAMIN B12) 1000 MCG tablet Take 1,000 mcg by mouth daily.    [provider]  diclofenac  Sodium (VOLTAREN ) 1 % GEL APPLY SPARINGLY TO AFFECTED AREA(S) ONCE  DAILY EQUIVALENT TO DICLOFENAC     GEL 1% 11/10/16   [provider]  ferrous sulfate 325 (65 FE) MG tablet Take 325 mg by mouth daily.    [provider]  FLUoxetine  (PROZAC ) 20 MG capsule Take 60 mg by mouth every morning. 09/16/23   [provider]  gabapentin  (NEURONTIN ) 600 MG tablet Take 1.5 tablets (900 mg total) by mouth 3 (three) times daily. 06/02/22   Wendolyn Jenkins Jansky, MD  ketoconazole  (NIZORAL ) 2 % cream Apply 1 Application topically 2 (two) times daily. 03/22/23   Wendolyn Jenkins Jansky, MD  levETIRAcetam  (KEPPRA ) 500 MG tablet Take 500 mg by mouth 2 (two) times daily. 02/24/24 02/18/25  [provider]  Melatonin 10 MG TABS Take 10 mg by mouth at bedtime as needed (sleep).    [provider]  metoprolol  succinate (TOPROL -XL) 25 MG 24 hr tablet TAKE 1 TABLET BY MOUTH EVERY NIGHT AT BEDTIME **TAKE IF HEART RATE IS GREATER THAN 90** 11/17/23   Jerilynn Lamarr HERO, NP  OLANZapine  (ZYPREXA ) 10 MG tablet Take 10 mg by mouth at bedtime. 04/04/19   [provider]  oxyCODONE  (ROXICODONE ) 5 MG immediate release tablet Take 1 tablet (5 mg total) by mouth every 4 (four) hours as needed. 11/28/23   Overturf, Jackson L, PA-C  potassium chloride  SA (KLOR-CON  M20) 20 MEQ tablet Take 1 tablet (20 mEq total) by mouth 2 (two) times daily. 06/22/24  Lavona Agent, MD  Semaglutide-Weight Management 0.25 MG/0.5ML SOAJ Inject 0.25 mg into the skin once a week.    [provider]  Sodium Sulfate-Mag Sulfate-KCl (SUTAB ) 1479-225-188 MG TABS Use as directed for colonoscopy. MANUFACTURER CODES!! BIN: [id number] PCN: CN GROUP: [id number] MEMBER ID: [id number];MLW AS SECONDARY INSURANCE ;NO PRIOR AUTHORIZATION 10/26/23   Beather Delon Gibson, PA  triamcinolone  cream (KENALOG ) 0.1 % APPLY TO THE AFFECTED AREA(S) 2 TIMES A DAY 11/25/22   Wendolyn Jenkins Jansky, MD  Vitamin D, Ergocalciferol, (DRISDOL) 1.25 MG (50000 UNIT) CAPS capsule Take 50,000 Units by mouth. 12/16/23    [provider]    Allergies: Nsaids, Latex, Other, Sulfa antibiotics, and Tape    Review of Systems  HENT:  Positive for nosebleeds.   Neurological:  Negative for dizziness and headaches.    Updated Vital Signs BP 122/72   Pulse (!) 103   Temp 98.1 F (36.7 C) (Oral)   Resp 18   Ht 5' 6 (1.676 m)   Wt 113 kg   LMP 09/30/2012   SpO2 98%   BMI 40.21 kg/m   Physical Exam Vitals and nursing note reviewed.  Constitutional:      General: She is not in acute distress.    Appearance: She is well-developed. She is not diaphoretic.  HENT:     Head: Normocephalic and atraumatic.     Right Ear: External ear normal.     Left Ear: External ear normal.     Nose: Nose normal.     Comments: Right Nasal septum with punctate area of blood in kiesselbach's plexus, no active bleeding     Mouth/Throat:     Mouth: Mucous membranes are moist.     Comments: No blood in the oropharynx  Eyes:     General: No scleral icterus.    Conjunctiva/sclera: Conjunctivae normal.  Cardiovascular:     Rate and Rhythm: Normal rate and regular rhythm.     Heart sounds: Normal heart sounds. No murmur heard.    No friction rub. No gallop.  Pulmonary:     Effort: Pulmonary effort is normal. No respiratory distress.     Breath sounds: Normal breath sounds.  Abdominal:     General: Bowel sounds are normal. There is no distension.     Palpations: Abdomen is soft. There is no mass.     Tenderness: There is no abdominal tenderness. There is no guarding.  Musculoskeletal:     Cervical back: Normal range of motion.  Skin:    General: Skin is warm and dry.  Neurological:     Mental Status: She is alert and oriented to person, place, and time.  Psychiatric:        Behavior: Behavior normal.     (all labs ordered are listed, but only abnormal results are displayed) Labs Reviewed  CBC  BASIC METABOLIC PANEL WITH GFR    EKG: None  Radiology: No results found.   Epistaxis  Management  Date/Time: 08/27/2024 3:11 PM  Performed by: Arloa Chroman, PA-C Authorized by: Arloa Chroman, PA-C   Consent:    Consent obtained:  Verbal   Consent given by:  Patient   Risks discussed:  Bleeding, infection, nasal injury and pain   Alternatives discussed:  No treatment Anesthesia:    Anesthesia method:  Topical application   Topical anesthetic:  LET Procedure details:    Treatment site:  R anterior   Treatment method:  Silver  nitrate   Treatment complexity:  Limited   Treatment episode:  initial   Post-procedure details:    Assessment:  Bleeding stopped   Procedure completion:  Tolerated well, no immediate complications    Medications Ordered in the ED  lidocaine -EPINEPHrine -tetracaine  (LET) topical gel (has no administration in time range)  silver  nitrate applicators applicator 1 Application (has no administration in time range)  oxymetazoline  (AFRIN) 0.05 % nasal spray 1 spray (1 spray Each Nare Given 08/27/24 1254)                                    Medical Decision Making Amount and/or Complexity of Data Reviewed Labs: ordered.  Risk OTC drugs. Prescription drug management.   Patient here with epistaxis from the right naris.  Treated with chemical cautery. No active bleeding at this time labs reassuring ordered and interpreted, CBC is within normal limits.  Patient will be discharged with nasal hygiene epistaxis management and strict return precautions.  She presented was appropriate for discharge at this time     Final diagnoses:  None    ED Discharge Orders     None          Arloa Chroman, PA-C 08/27/24 1521    Ruthe Cornet, DO 08/28/24 [phone]

## 2024-08-27 NOTE — Discharge Instructions (Signed)
 ### Epistaxis Care on Eliquis      If you have a nosebleed while taking apixaban  (Eliquis ), a blood thinner, it's important to know how to manage it and when to seek help. Blood thinners can make nosebleeds last longer or be harder to stop.[1][2]      **What to do if you get a nosebleed:**      - Sit up and lean forward. This keeps blood from going down your throat, which can make you cough or feel sick.[2]      - Pinch the soft part of your nose (just below the bony bridge) firmly for 15-20 minutes without letting go. Breathe through your mouth.[3][2][4]      - If bleeding continues, you may use a cotton ball or gauze soaked in Afrin (oxymetazoline ) and gently place it in the nostril. Afrin helps shrink blood vessels and slow bleeding.[5][4][6]          **When to get medical help right away:**      - If bleeding lasts longer than 30 minutes, or you cannot stop it with pressure and packing.      - If you lose a lot of blood, feel weak, dizzy, or faint.      - If you have trouble breathing or blood goes down your throat and makes you vomit.      - If you have a nosebleed after an injury, or if your nose looks crooked or swollen.[2][3][1]      **How to prevent future nosebleeds:**      - Keep your nose moist. Use saline sprays or gels, or a humidifier at home, especially in dry weather.[3]      - Avoid picking your nose or blowing it hard.      - Do not do heavy lifting or strenuous exercise for at least a week after a nosebleed.      - Avoid putting anything in your nose unless your doctor tells you to.      - If you need to sneeze, do so with your mouth open to reduce pressure in your nose.[3]      **Special advice for people on blood thinners:**      - Nosebleeds may happen more often or be harder to control. Always follow the steps above and let your healthcare provider know if you have frequent or severe nosebleeds.[1][2]      - Do not stop taking apixaban  unless your  doctor tells you to.      **Follow-up care:**      - If you had packing placed, follow your doctor's instructions about when to return for removal or check-up. Some packing dissolves on its own, but others need to be taken out by a healthcare provider.[3][1]      - Watch for signs of infection, like fever, increasing pain, or pus from the nose.      If you have questions or concerns, contact your healthcare provider.      ### References  1. Clinical Practice Guideline: Nosebleed (Epistaxis). Tunkel DE, Arlean GORMAN [place], et al. Otolaryngology--Head and Neck Surgery : Official Journal of American Academy of Otolaryngology-Head and Neck Surgery. 2020;162(1_suppl):S1-S38. doi:10.1177/0194599819890327. 2. 2024 American Heart Association and American Red Cross Guidelines for First Aid. Hewett Brumberg EK, Douma MJ, Alibertis K, et al. Circulation. 2024;150(24):e519-e579. doi:10.1161/CIR.0000000000001281. 3. Epistaxis. Seikaly H. The [place] Journal of Medicine. 2021;384(10):944-951. doi:10.1056/NEJMcp2019344. 4. Epistaxis. Schlosser RJ. The [place] Journal of Medicine. 2009;360(8):784-9. doi:10.1056/NEJMcp0807078. 5. Epistaxis: Outpatient Management. Womack  JP, Kropa J, Jimenez Stabile M. American Family Physician. 2018;98(4):240-245. 6. Overview of Etiology and Management of Epistaxis: Through the Mnemonic EPISTAXIS. zbay S, Bayar Muluk N, Yagci T, et al. The Journal of Craniofacial Surgery. 2025;:[id number]. doi:10.1097/SCS.0000000000012040.
